# Patient Record
Sex: Male | Born: 1935 | Race: White | Hispanic: No | Smoking: Former smoker
Health system: Southern US, Community
[De-identification: ages and names within clinical notes are randomized; demographics above are authoritative.]

## PROBLEM LIST (undated history)

## (undated) DIAGNOSIS — E7800 Pure hypercholesterolemia, unspecified: Secondary | ICD-10-CM

## (undated) DIAGNOSIS — D649 Anemia, unspecified: Secondary | ICD-10-CM

## (undated) DIAGNOSIS — I219 Acute myocardial infarction, unspecified: Secondary | ICD-10-CM

## (undated) DIAGNOSIS — I639 Cerebral infarction, unspecified: Secondary | ICD-10-CM

## (undated) DIAGNOSIS — M1990 Unspecified osteoarthritis, unspecified site: Secondary | ICD-10-CM

## (undated) DIAGNOSIS — I6529 Occlusion and stenosis of unspecified carotid artery: Secondary | ICD-10-CM

## (undated) DIAGNOSIS — Z8673 Personal history of transient ischemic attack (TIA), and cerebral infarction without residual deficits: Secondary | ICD-10-CM

## (undated) DIAGNOSIS — K219 Gastro-esophageal reflux disease without esophagitis: Secondary | ICD-10-CM

## (undated) DIAGNOSIS — I2510 Atherosclerotic heart disease of native coronary artery without angina pectoris: Secondary | ICD-10-CM

## (undated) DIAGNOSIS — R269 Unspecified abnormalities of gait and mobility: Secondary | ICD-10-CM

## (undated) DIAGNOSIS — G629 Polyneuropathy, unspecified: Secondary | ICD-10-CM

## (undated) DIAGNOSIS — I255 Ischemic cardiomyopathy: Secondary | ICD-10-CM

## (undated) DIAGNOSIS — Z972 Presence of dental prosthetic device (complete) (partial): Secondary | ICD-10-CM

## (undated) DIAGNOSIS — H9192 Unspecified hearing loss, left ear: Secondary | ICD-10-CM

## (undated) DIAGNOSIS — N393 Stress incontinence (female) (male): Secondary | ICD-10-CM

## (undated) DIAGNOSIS — J449 Chronic obstructive pulmonary disease, unspecified: Secondary | ICD-10-CM

## (undated) DIAGNOSIS — D569 Thalassemia, unspecified: Secondary | ICD-10-CM

## (undated) DIAGNOSIS — M21371 Foot drop, right foot: Secondary | ICD-10-CM

## (undated) DIAGNOSIS — G609 Hereditary and idiopathic neuropathy, unspecified: Principal | ICD-10-CM

## (undated) DIAGNOSIS — R42 Dizziness and giddiness: Secondary | ICD-10-CM

## (undated) DIAGNOSIS — G4730 Sleep apnea, unspecified: Secondary | ICD-10-CM

## (undated) DIAGNOSIS — C61 Malignant neoplasm of prostate: Secondary | ICD-10-CM

## (undated) DIAGNOSIS — I10 Essential (primary) hypertension: Secondary | ICD-10-CM

## (undated) HISTORY — DX: Pure hypercholesterolemia, unspecified: E78.00

## (undated) HISTORY — PX: EYE SURGERY: SHX253

## (undated) HISTORY — DX: Dizziness and giddiness: R42

## (undated) HISTORY — DX: Hereditary and idiopathic neuropathy, unspecified: G60.9

## (undated) HISTORY — PX: CARDIAC CATHETERIZATION: SHX172

## (undated) HISTORY — PX: SPINE SURGERY: SHX786

## (undated) HISTORY — PX: CATARACT EXTRACTION W/ INTRAOCULAR LENS IMPLANT: SHX1309

## (undated) HISTORY — PX: APPENDECTOMY: SHX54

## (undated) HISTORY — PX: ANKLE SURGERY: SHX546

## (undated) HISTORY — PX: OTHER SURGICAL HISTORY: SHX169

## (undated) HISTORY — DX: Occlusion and stenosis of unspecified carotid artery: I65.29

## (undated) HISTORY — DX: Atherosclerotic heart disease of native coronary artery without angina pectoris: I25.10

## (undated) HISTORY — PX: PENILE PROSTHESIS IMPLANT: SHX240

## (undated) HISTORY — PX: JOINT REPLACEMENT: SHX530

## (undated) HISTORY — PX: BACK SURGERY: SHX140

## (undated) HISTORY — DX: Unspecified abnormalities of gait and mobility: R26.9

## (undated) HISTORY — PX: CHOLECYSTECTOMY: SHX55

## (undated) HISTORY — DX: Unspecified osteoarthritis, unspecified site: M19.90

## (undated) HISTORY — DX: Polyneuropathy, unspecified: G62.9

## (undated) HISTORY — PX: CAROTID ENDARTERECTOMY: SUR193

## (undated) HISTORY — DX: Anemia, unspecified: D64.9

## (undated) HISTORY — DX: Acute myocardial infarction, unspecified: I21.9

---

## 1977-08-14 DIAGNOSIS — I219 Acute myocardial infarction, unspecified: Secondary | ICD-10-CM

## 1977-08-14 HISTORY — DX: Acute myocardial infarction, unspecified: I21.9

## 2001-04-16 ENCOUNTER — Encounter: Payer: Self-pay | Admitting: Urology

## 2001-04-19 ENCOUNTER — Observation Stay (HOSPITAL_COMMUNITY): Admission: RE | Admit: 2001-04-19 | Discharge: 2001-04-20 | Payer: Self-pay | Admitting: Urology

## 2003-12-23 ENCOUNTER — Inpatient Hospital Stay (HOSPITAL_COMMUNITY): Admission: EM | Admit: 2003-12-23 | Discharge: 2003-12-24 | Payer: Self-pay | Admitting: Emergency Medicine

## 2004-03-01 ENCOUNTER — Ambulatory Visit (HOSPITAL_COMMUNITY): Admission: RE | Admit: 2004-03-01 | Discharge: 2004-03-01 | Payer: Self-pay | Admitting: Urology

## 2004-03-21 ENCOUNTER — Ambulatory Visit: Admission: RE | Admit: 2004-03-21 | Discharge: 2004-06-16 | Payer: Self-pay | Admitting: Radiation Oncology

## 2004-03-29 ENCOUNTER — Emergency Department (HOSPITAL_COMMUNITY): Admission: EM | Admit: 2004-03-29 | Discharge: 2004-03-29 | Payer: Self-pay | Admitting: Emergency Medicine

## 2004-03-30 ENCOUNTER — Encounter: Admission: RE | Admit: 2004-03-30 | Discharge: 2004-03-30 | Payer: Self-pay | Admitting: Urology

## 2004-05-03 ENCOUNTER — Ambulatory Visit (HOSPITAL_COMMUNITY): Admission: RE | Admit: 2004-05-03 | Discharge: 2004-05-03 | Payer: Self-pay | Admitting: Urology

## 2004-05-03 ENCOUNTER — Ambulatory Visit (HOSPITAL_BASED_OUTPATIENT_CLINIC_OR_DEPARTMENT_OTHER): Admission: RE | Admit: 2004-05-03 | Discharge: 2004-05-03 | Payer: Self-pay | Admitting: Urology

## 2004-07-05 ENCOUNTER — Encounter: Admission: RE | Admit: 2004-07-05 | Discharge: 2004-07-05 | Payer: Self-pay | Admitting: Neurology

## 2004-11-28 ENCOUNTER — Ambulatory Visit (HOSPITAL_BASED_OUTPATIENT_CLINIC_OR_DEPARTMENT_OTHER): Admission: RE | Admit: 2004-11-28 | Discharge: 2004-11-28 | Payer: Self-pay | Admitting: Family Medicine

## 2004-12-04 ENCOUNTER — Ambulatory Visit: Payer: Self-pay | Admitting: Internal Medicine

## 2005-04-19 ENCOUNTER — Ambulatory Visit: Payer: Self-pay | Admitting: *Deleted

## 2005-05-05 ENCOUNTER — Ambulatory Visit: Payer: Self-pay

## 2005-08-22 ENCOUNTER — Ambulatory Visit: Payer: Self-pay | Admitting: *Deleted

## 2005-08-25 ENCOUNTER — Ambulatory Visit: Payer: Self-pay | Admitting: *Deleted

## 2005-10-06 ENCOUNTER — Inpatient Hospital Stay (HOSPITAL_COMMUNITY): Admission: RE | Admit: 2005-10-06 | Discharge: 2005-10-10 | Payer: Self-pay | Admitting: Internal Medicine

## 2006-04-11 ENCOUNTER — Ambulatory Visit: Payer: Self-pay | Admitting: *Deleted

## 2006-04-23 ENCOUNTER — Ambulatory Visit: Payer: Self-pay

## 2006-04-23 ENCOUNTER — Encounter: Payer: Self-pay | Admitting: Cardiology

## 2006-05-02 ENCOUNTER — Ambulatory Visit: Payer: Self-pay | Admitting: *Deleted

## 2006-05-03 ENCOUNTER — Ambulatory Visit: Payer: Self-pay

## 2006-05-09 ENCOUNTER — Ambulatory Visit: Payer: Self-pay

## 2006-06-11 ENCOUNTER — Ambulatory Visit (HOSPITAL_COMMUNITY): Admission: RE | Admit: 2006-06-11 | Discharge: 2006-06-12 | Payer: Self-pay | Admitting: Orthopedic Surgery

## 2006-06-13 ENCOUNTER — Emergency Department (HOSPITAL_COMMUNITY): Admission: EM | Admit: 2006-06-13 | Discharge: 2006-06-14 | Payer: Self-pay | Admitting: Emergency Medicine

## 2007-06-12 ENCOUNTER — Ambulatory Visit (HOSPITAL_COMMUNITY): Admission: RE | Admit: 2007-06-12 | Discharge: 2007-06-12 | Payer: Self-pay | Admitting: Cardiology

## 2007-06-12 ENCOUNTER — Ambulatory Visit: Payer: Self-pay | Admitting: Cardiology

## 2007-06-12 LAB — CONVERTED CEMR LAB
ALT: 19 units/L (ref 0–53)
AST: 31 units/L (ref 0–37)
Alkaline Phosphatase: 50 units/L (ref 39–117)
BUN: 20 mg/dL (ref 6–23)
Basophils Relative: 0.9 % (ref 0.0–1.0)
CO2: 29 meq/L (ref 19–32)
Calcium: 8.9 mg/dL (ref 8.4–10.5)
Creatinine, Ser: 1.1 mg/dL (ref 0.4–1.5)
Hemoglobin: 8.8 g/dL — ABNORMAL LOW (ref 13.0–17.0)
INR: 5.5 (ref 0.8–1.0)
Monocytes Absolute: 0.6 10*3/uL (ref 0.2–0.7)
Monocytes Relative: 7.2 % (ref 3.0–11.0)
Potassium: 4.1 meq/L (ref 3.5–5.1)
Prothrombin Time: 30.3 s (ref 10.9–13.3)
RDW: 14.5 % (ref 11.5–14.6)
Total Bilirubin: 1.3 mg/dL — ABNORMAL HIGH (ref 0.3–1.2)
Total Protein: 6.3 g/dL (ref 6.0–8.3)

## 2007-06-14 ENCOUNTER — Encounter (HOSPITAL_COMMUNITY): Admission: RE | Admit: 2007-06-14 | Discharge: 2007-08-16 | Payer: Self-pay | Admitting: Family Medicine

## 2007-08-07 ENCOUNTER — Ambulatory Visit: Payer: Self-pay

## 2007-08-15 HISTORY — PX: RADIOACTIVE SEED IMPLANT: SHX5150

## 2007-08-31 ENCOUNTER — Emergency Department (HOSPITAL_COMMUNITY): Admission: EM | Admit: 2007-08-31 | Discharge: 2007-08-31 | Payer: Self-pay | Admitting: *Deleted

## 2007-09-01 ENCOUNTER — Observation Stay (HOSPITAL_COMMUNITY): Admission: EM | Admit: 2007-09-01 | Discharge: 2007-09-03 | Payer: Self-pay | Admitting: Emergency Medicine

## 2007-09-06 ENCOUNTER — Ambulatory Visit: Payer: Self-pay

## 2007-09-13 ENCOUNTER — Ambulatory Visit: Payer: Self-pay | Admitting: Cardiology

## 2007-09-13 LAB — CONVERTED CEMR LAB
CO2: 32 meq/L (ref 19–32)
GFR calc Af Amer: 92 mL/min
Glucose, Bld: 105 mg/dL — ABNORMAL HIGH (ref 70–99)
Potassium: 4.3 meq/L (ref 3.5–5.1)
Sodium: 143 meq/L (ref 135–145)

## 2007-10-03 ENCOUNTER — Ambulatory Visit (HOSPITAL_COMMUNITY): Admission: RE | Admit: 2007-10-03 | Discharge: 2007-10-03 | Payer: Self-pay | Admitting: Gastroenterology

## 2007-10-14 ENCOUNTER — Ambulatory Visit (HOSPITAL_BASED_OUTPATIENT_CLINIC_OR_DEPARTMENT_OTHER): Admission: RE | Admit: 2007-10-14 | Discharge: 2007-10-15 | Payer: Self-pay | Admitting: Orthopedic Surgery

## 2007-12-24 ENCOUNTER — Ambulatory Visit: Payer: Self-pay | Admitting: Oncology

## 2008-02-17 ENCOUNTER — Ambulatory Visit: Payer: Self-pay | Admitting: Cardiology

## 2008-02-17 ENCOUNTER — Inpatient Hospital Stay (HOSPITAL_COMMUNITY): Admission: RE | Admit: 2008-02-17 | Discharge: 2008-03-02 | Payer: Self-pay | Admitting: Orthopedic Surgery

## 2008-02-21 ENCOUNTER — Ambulatory Visit: Payer: Self-pay | Admitting: Physical Medicine & Rehabilitation

## 2008-03-12 ENCOUNTER — Ambulatory Visit: Payer: Self-pay | Admitting: Cardiology

## 2008-04-15 ENCOUNTER — Ambulatory Visit: Payer: Self-pay | Admitting: Oncology

## 2008-04-21 ENCOUNTER — Ambulatory Visit: Payer: Self-pay | Admitting: Cardiology

## 2008-04-21 LAB — CBC WITH DIFFERENTIAL/PLATELET
Basophils Absolute: 0 10*3/uL (ref 0.0–0.1)
EOS%: 5 % (ref 0.0–7.0)
HGB: 10.6 g/dL — ABNORMAL LOW (ref 13.0–17.1)
MCH: 21.2 pg — ABNORMAL LOW (ref 28.0–33.4)
MONO#: 0.4 10*3/uL (ref 0.1–0.9)
NEUT#: 2.9 10*3/uL (ref 1.5–6.5)
RDW: 15.3 % — ABNORMAL HIGH (ref 11.2–14.6)
WBC: 5.7 10*3/uL (ref 4.0–10.0)
lymph#: 2 10*3/uL (ref 0.9–3.3)

## 2008-08-06 ENCOUNTER — Ambulatory Visit: Payer: Self-pay

## 2008-10-19 ENCOUNTER — Ambulatory Visit: Payer: Self-pay | Admitting: Oncology

## 2008-11-05 ENCOUNTER — Encounter (INDEPENDENT_AMBULATORY_CARE_PROVIDER_SITE_OTHER): Payer: Self-pay | Admitting: *Deleted

## 2008-11-06 ENCOUNTER — Ambulatory Visit: Payer: Self-pay | Admitting: Cardiology

## 2008-11-06 ENCOUNTER — Encounter: Payer: Self-pay | Admitting: Cardiology

## 2008-11-06 DIAGNOSIS — I2510 Atherosclerotic heart disease of native coronary artery without angina pectoris: Secondary | ICD-10-CM

## 2008-12-18 ENCOUNTER — Encounter: Payer: Self-pay | Admitting: Cardiovascular Disease

## 2008-12-18 ENCOUNTER — Observation Stay (HOSPITAL_COMMUNITY): Admission: EM | Admit: 2008-12-18 | Discharge: 2008-12-20 | Payer: Self-pay | Admitting: Cardiovascular Disease

## 2008-12-18 ENCOUNTER — Ambulatory Visit: Payer: Self-pay | Admitting: Cardiovascular Disease

## 2008-12-22 ENCOUNTER — Ambulatory Visit: Payer: Self-pay | Admitting: Cardiology

## 2008-12-25 LAB — CONVERTED CEMR LAB
BUN: 31 mg/dL — ABNORMAL HIGH (ref 6–23)
Creatinine, Ser: 1.2 mg/dL (ref 0.4–1.5)
GFR calc non Af Amer: 61.46 mL/min (ref >60)

## 2008-12-29 ENCOUNTER — Telehealth: Payer: Self-pay | Admitting: Cardiology

## 2009-01-01 ENCOUNTER — Ambulatory Visit: Payer: Self-pay | Admitting: Cardiology

## 2009-01-01 DIAGNOSIS — R609 Edema, unspecified: Secondary | ICD-10-CM

## 2009-01-01 DIAGNOSIS — I5032 Chronic diastolic (congestive) heart failure: Secondary | ICD-10-CM

## 2009-03-31 ENCOUNTER — Encounter (INDEPENDENT_AMBULATORY_CARE_PROVIDER_SITE_OTHER): Payer: Self-pay | Admitting: *Deleted

## 2009-04-21 ENCOUNTER — Ambulatory Visit: Payer: Self-pay | Admitting: Cardiology

## 2009-04-21 DIAGNOSIS — I6529 Occlusion and stenosis of unspecified carotid artery: Secondary | ICD-10-CM

## 2009-04-21 DIAGNOSIS — I10 Essential (primary) hypertension: Secondary | ICD-10-CM

## 2009-08-10 ENCOUNTER — Encounter: Payer: Self-pay | Admitting: Cardiology

## 2009-08-11 ENCOUNTER — Ambulatory Visit: Payer: Self-pay

## 2009-08-11 ENCOUNTER — Encounter: Payer: Self-pay | Admitting: Cardiology

## 2009-10-12 ENCOUNTER — Ambulatory Visit: Payer: Self-pay | Admitting: Cardiology

## 2009-12-01 ENCOUNTER — Telehealth: Payer: Self-pay | Admitting: Cardiology

## 2009-12-26 ENCOUNTER — Inpatient Hospital Stay (HOSPITAL_COMMUNITY): Admission: EM | Admit: 2009-12-26 | Discharge: 2009-12-28 | Payer: Self-pay | Admitting: Emergency Medicine

## 2010-03-10 ENCOUNTER — Encounter: Payer: Self-pay | Admitting: Cardiology

## 2010-03-24 ENCOUNTER — Inpatient Hospital Stay (HOSPITAL_COMMUNITY): Admission: RE | Admit: 2010-03-24 | Discharge: 2010-03-27 | Payer: Self-pay | Admitting: Orthopedic Surgery

## 2010-03-24 HISTORY — PX: LUMBAR LAMINECTOMY: SHX95

## 2010-04-29 ENCOUNTER — Ambulatory Visit: Payer: Self-pay | Admitting: Cardiology

## 2010-08-12 ENCOUNTER — Encounter: Payer: Self-pay | Admitting: Cardiology

## 2010-08-16 ENCOUNTER — Encounter: Payer: Self-pay | Admitting: Cardiology

## 2010-08-16 ENCOUNTER — Ambulatory Visit: Admission: RE | Admit: 2010-08-16 | Discharge: 2010-08-16 | Payer: Self-pay | Source: Home / Self Care

## 2010-09-13 NOTE — Assessment & Plan Note (Signed)
Summary: f 6 months   Visit Type:  6 mo f/u Primary Provider:  Illa Level MD  CC:  right leg edema...little sob...denies any cp.  History of Present Illness: [name] comes in today for followup.  He has no complaints of angina except on rare occasion when he pushes himself. He goes away with rest. He still sleeps on 2 pillows and has had no orthopnea or PND. He does have some peripheral edema at the end of the day.  Very compliant with medications.  Current Medications (verified): 1)  Gabapentin 300 Mg Caps (Gabapentin) .Marland Kitchen.. 1 Tab Three Times A Day 2)  Oxybutynin Chloride 5 Mg Tabs (Oxybutynin Chloride) .Marland Kitchen.. 1 Tab Three Times A Day 3)  Carvedilol 6.25 Mg Tabs (Carvedilol) .... 1/2 Tab Two Times A Day 4)  Pravastatin Sodium 20 Mg Tabs (Pravastatin Sodium) .... 3 Tabs At Bedtime 5)  Vitamin C 500 Mg Tabs (Ascorbic Acid) .Marland Kitchen.. 1 Tab Once Daily 6)  Lisinopril 5 Mg Tabs (Lisinopril) .... Take One Tablet By Mouth Daily 7)  Senokot S 8.6-50 Mg Tabs (Sennosides-Docusate Sodium) .... Use As Directed 8)  Tylenol 8 Hour 650 Mg Cr-Tabs (Acetaminophen) .... Use As Directed 9)  Colace 100 Mg Caps (Docusate Sodium) .... Use As Directed..prn 10)  Furosemide 20 Mg Tabs (Furosemide) .... Take One Tablet By Mouth Daily. 11)  Nitrostat 0.4 Mg Subl (Nitroglycerin) .Marland Kitchen.. 1 Tablet Under Tongue At Onset of Chest Pain; You May Repeat Every 5 Minutes For Up To 3 Doses.  Allergies: 1)  ! Pcn 2)  ! Sulfa 3)  ! * Dilaudid 4)  ! Plavix (Clopidogrel Bisulfate) 5)  Amoxicillin (Amoxicillin) 6)  Sulfamethoxazole (Sulfamethoxazole)  Past History:  Past Medical History: Last updated: 10/06/2009 CAD, NATIVE VESSEL (ICD-414.01) CAROTID ARTERY STENOSIS, WITHOUT INFARCTION (ICD-433.10) HYPERTENSION, BENIGN (ICD-401.1) DIASTOLIC HEART FAILURE, CHRONIC (ICD-428.32) EDEMA (ICD-782.3)  Past Surgical History: Last updated: 11/04/2008 Penile implant Cholecystectomy Right Carotid Endarterectomy  Family  History: Last updated: 11/04/2008 FH significant for Thalassemia  Social History: Last updated: 11/04/2008 Widowed  Tobacco Use - No.  Alcohol Use - no Drug Use - no  Risk Factors: Smoking Status: never (11/04/2008)  Review of Systems       negative other than history of present illness  Vital Signs:  Patient profile:   77 year old male Height:      71 inches Weight:      181 pounds Pulse rate:   72 / minute Pulse rhythm:   regular BP sitting:   108 / 54  (left arm) Cuff size:   large  Vitals Entered By: Danielle Rankin, CMA (October 12, 2009 11:05 AM)  Physical Exam  General:  Well developed, well nourished, in no acute distress. Head:  normocephalic and atraumatic Neck:  Neck supple, no JVD. No masses, thyromegaly or abnormal cervical nodes. Chest Sallye Lunz:  no deformities or breast masses noted Lungs:  Clear bilaterally to auscultation and percussion. Heart:  Non-displaced PMI, chest non-tender; regular rate and rhythm, S1, S2 without murmurs, rubs or gallops. Carotid upstroke normal, no bruit. Normal abdominal aortic size, no bruits. Femorals normal pulses, no bruits. Pedals normal pulses. No edema, no varicosities. Msk:  decreased ROM.   Pulses:  pulses normal in all 4 extremities Extremities:  trace left pedal edema and trace right pedal edema.   Neurologic:  Alert and oriented x 3. Skin:  Intact without lesions or rashes. Psych:  Normal affect.   EKG  Procedure date:  04/21/2009  Findings:  normal sinus rhythm, T wave changes inferiorly, poor progression anterior corneum, stable findings.  Impression & Recommendations:  Problem # 1:  CAROTID ARTERY STENOSIS, WITHOUT INFARCTION (ICD-433.10) Assessment Unchanged  The following medications were removed from the medication list:    Aspirin Ec 325 Mg Tbec (Aspirin) .Marland Kitchen... Take one tablet by mouth daily  Problem # 2:  CAD, NATIVE VESSEL (ICD-414.01) Assessment: Unchanged  The following medications were  removed from the medication list:    Aspirin Ec 325 Mg Tbec (Aspirin) .Marland Kitchen... Take one tablet by mouth daily His updated medication list for this problem includes:    Carvedilol 6.25 Mg Tabs (Carvedilol) .Marland Kitchen... 1/2 tab two times a day    Lisinopril 5 Mg Tabs (Lisinopril) .Marland Kitchen... Take one tablet by mouth daily    Nitrostat 0.4 Mg Subl (Nitroglycerin) .Marland Kitchen... 1 tablet under tongue at onset of chest pain; you may repeat every 5 minutes for up to 3 doses.  Problem # 3:  HYPERTENSION, BENIGN (ICD-401.1) Assessment: Improved  The following medications were removed from the medication list:    Aspirin Ec 325 Mg Tbec (Aspirin) .Marland Kitchen... Take one tablet by mouth daily His updated medication list for this problem includes:    Carvedilol 6.25 Mg Tabs (Carvedilol) .Marland Kitchen... 1/2 tab two times a day    Lisinopril 5 Mg Tabs (Lisinopril) .Marland Kitchen... Take one tablet by mouth daily    Furosemide 20 Mg Tabs (Furosemide) .Marland Kitchen... Take one tablet by mouth daily.  Problem # 4:  DIASTOLIC HEART FAILURE, CHRONIC (ICD-428.32) Assessment: Unchanged  The following medications were removed from the medication list:    Aspirin Ec 325 Mg Tbec (Aspirin) .Marland Kitchen... Take one tablet by mouth daily His updated medication list for this problem includes:    Carvedilol 6.25 Mg Tabs (Carvedilol) .Marland Kitchen... 1/2 tab two times a day    Lisinopril 5 Mg Tabs (Lisinopril) .Marland Kitchen... Take one tablet by mouth daily    Furosemide 20 Mg Tabs (Furosemide) .Marland Kitchen... Take one tablet by mouth daily.    Nitrostat 0.4 Mg Subl (Nitroglycerin) .Marland Kitchen... 1 tablet under tongue at onset of chest pain; you may repeat every 5 minutes for up to 3 doses.  Patient Instructions: 1)  Your physician recommends that you schedule a follow-up appointment in: 6 MONTHS WITH DR Kenneith Stief 2)  Your physician recommends that you continue on your current medications as directed. Please refer to the Current Medication list given to you today.

## 2010-09-13 NOTE — Progress Notes (Signed)
Summary: Having back surgery need clearnace  Phone Note Call from Patient Call back at Home Phone [phone]   Caller: Patient Summary of Call: Pt having surgery on his back and need clearance. Initial call taken by: Judie Grieve,  December 01, 2009 2:57 PM  Follow-up for Phone Call        Just saw in office. He is stable and can be cleared for surgery at low risk. Follow-up by: Gaylord Shih, MD, [facility],  December 01, 2009 3:10 PM     Appended Document: Having back surgery need clearnace PT AWARE./CY  Appended Document: Having back surgery need clearnace SENT VIA FAX TO [facility] TO FAX NO [phone]./CY

## 2010-09-13 NOTE — Letter (Signed)
Summary: [facility]  [facility]   Imported By: Marylou Mccoy 03/21/2010 16:20:11  _____________________________________________________________________  External Attachment:    Type:   Image     Comment:   External Document

## 2010-09-13 NOTE — Assessment & Plan Note (Signed)
Summary: 6 mo f/u ./cy   Visit Type:  6  mo f/u Primary Provider:  Illa Level MD  CC:  edema/legs and especially the right leg states pt....Marland Kitchenno other complaints today.  History of Present Illness: [name] returns today for further evaluation and management of his coronary artery disease and nonobstructive carotid disease.  He's having no major ischemic symptoms. He recently had back surgery in August and did well from a cardiac standpoint. He's developed a foot drop on the right. Between that and his neuropathies had some problems with his balance.  He denies orthopnea, PND or increased edema. He has a history of acute pulmonary edema from hypertensive crisis. He's very compliant with his medications and his diet.  He's had no symptoms of TIAs or mini strokes. Carotid Dopplers in December of 2000 and were stable.  Current Medications (verified): 1)  Gabapentin 300 Mg Caps (Gabapentin) .Marland Kitchen.. 1 Tab Three Times A Day 2)  Oxybutynin Chloride 5 Mg Tabs (Oxybutynin Chloride) .Marland Kitchen.. 1 Tab Three Times A Day 3)  Carvedilol 6.25 Mg Tabs (Carvedilol) .... 1/2 Tab Two Times A Day 4)  Pravastatin Sodium 20 Mg Tabs (Pravastatin Sodium) .... 3 Tabs At Bedtime 5)  Vitamin C 500 Mg Tabs (Ascorbic Acid) .Marland Kitchen.. 1 Tab Once Daily 6)  Senokot S 8.6-50 Mg Tabs (Sennosides-Docusate Sodium) .... Use As Directed 7)  Tylenol 8 Hour 650 Mg Cr-Tabs (Acetaminophen) .... Use As Directed 8)  Colace 100 Mg Caps (Docusate Sodium) .... Use As Directed..prn 9)  Furosemide 20 Mg Tabs (Furosemide) .... Take One Tablet By Mouth Daily. 10)  Nitrostat 0.4 Mg Subl (Nitroglycerin) .Marland Kitchen.. 1 Tablet Under Tongue At Onset of Chest Pain; You May Repeat Every 5 Minutes For Up To 3 Doses. 11)  Losartan Potassium 25 Mg Tabs (Losartan Potassium) .Marland Kitchen.. 1 Tab Once Daily 12)  Oxycodone-Acetaminophen 5-325 Mg Tabs (Oxycodone-Acetaminophen) .Marland Kitchen.. 1 Tab Q 4-6 Hours As Needed 13)  Tylenol Arthritis Pain 650 Mg Cr-Tabs (Acetaminophen) .... As  Needed 14)  Miralax  Powd (Polyethylene Glycol 3350) .... As Needed  Allergies: 1)  ! Pcn 2)  ! Sulfa 3)  ! * Dilaudid 4)  ! Plavix (Clopidogrel Bisulfate) 5)  Amoxicillin (Amoxicillin) 6)  Sulfamethoxazole (Sulfamethoxazole)  Past History:  Past Medical History: Last updated: 10/06/2009 CAD, NATIVE VESSEL (ICD-414.01) CAROTID ARTERY STENOSIS, WITHOUT INFARCTION (ICD-433.10) HYPERTENSION, BENIGN (ICD-401.1) DIASTOLIC HEART FAILURE, CHRONIC (ICD-428.32) EDEMA (ICD-782.3)  Past Surgical History: Last updated: 04/26/2010 Penile implant Cholecystectomy Right Carotid Endarterectomy Back Surgery...lumbar laminectomy at L3-4 and L4-5.Marland KitchenMarland Kitchen8/11/11 Ranee Gosselin, MD  Family History: Last updated: 11/04/2008 FH significant for Thalassemia  Social History: Last updated: 11/04/2008 Widowed  Tobacco Use - No.  Alcohol Use - no Drug Use - no  Risk Factors: Smoking Status: never (11/04/2008)  Review of Systems       negative other than history of present illness  Vital Signs:  Patient profile:   77 year old male Height:      71 inches Weight:      175.8 pounds BMI:     24.61 Pulse rate:   63 / minute Pulse rhythm:   irregular BP sitting:   130 / 80  (left arm) Cuff size:   large  Vitals Entered By: Danielle Rankin, CMA (April 29, 2010 10:10 AM)  Physical Exam  General:  no acute distress, pleasant Head:  normocephalic and atraumatic Eyes:  glasses otherwise normal Neck:  Neck supple, no JVD. No masses, thyromegaly or abnormal cervical nodes. Chest Wall:  no  deformities or breast masses noted Lungs:  Clear bilaterally to auscultation and percussion. Heart:  PMI nondisplaced, regular rate and rhythm, normal S1-S2, no gallop, carotid physical by without obvious bruits, right carotid endarterectomy scar Msk:  decreased ROM.   Pulses:  reduced bilaterally lower extremity, reduce capillary refill Extremities:  No clubbing or cyanosis. Neurologic:  Alert and oriented x  3. Skin:  Intact without lesions or rashes. Psych:  Normal affect.   EKG  Procedure date:  04/29/2010  Findings:      normal sinus rhythm, previous anteroseptal wall infarct with EKG changes, stable  Impression & Recommendations:  Problem # 1:  CAD, NATIVE VESSEL (ICD-414.01) Assessment Unchanged  The following medications were removed from the medication list:    Lisinopril 5 Mg Tabs (Lisinopril) .Marland Kitchen... Take one tablet by mouth daily His updated medication list for this problem includes:    Carvedilol 6.25 Mg Tabs (Carvedilol) .Marland Kitchen... 1/2 tab two times a day    Nitrostat 0.4 Mg Subl (Nitroglycerin) .Marland Kitchen... 1 tablet under tongue at onset of chest pain; you may repeat every 5 minutes for up to 3 doses.  Orders: EKG w/ Interpretation (93000)  Problem # 2:  CAROTID ARTERY STENOSIS, WITHOUT INFARCTION (ICD-433.10) Assessment: Unchanged  Problem # 3:  EDEMA (ICD-782.3) Assessment: Improved  Problem # 4:  HYPERTENSION, BENIGN (ICD-401.1) Assessment: Improved  The following medications were removed from the medication list:    Lisinopril 5 Mg Tabs (Lisinopril) .Marland Kitchen... Take one tablet by mouth daily His updated medication list for this problem includes:    Carvedilol 6.25 Mg Tabs (Carvedilol) .Marland Kitchen... 1/2 tab two times a day    Furosemide 20 Mg Tabs (Furosemide) .Marland Kitchen... Take one tablet by mouth daily.    Losartan Potassium 25 Mg Tabs (Losartan potassium) .Marland Kitchen... 1 tab once daily  Problem # 5:  HYPERTENSION, BENIGN (ICD-401.1)  The following medications were removed from the medication list:    Lisinopril 5 Mg Tabs (Lisinopril) .Marland Kitchen... Take one tablet by mouth daily His updated medication list for this problem includes:    Carvedilol 6.25 Mg Tabs (Carvedilol) .Marland Kitchen... 1/2 tab two times a day    Furosemide 20 Mg Tabs (Furosemide) .Marland Kitchen... Take one tablet by mouth daily.    Losartan Potassium 25 Mg Tabs (Losartan potassium) .Marland Kitchen... 1 tab once daily  Clinical Reports Reviewed:  Cardiac  Cath:  12/18/2008: Cardiac Cath Findings:  IMPRESSION:   1. Double-vessel coronary artery disease that appears stable and       unchanged from cath in July 2009.   2. Acute pulmonary edema that is likely secondary to diastolic       dysfunction.  The patient's blood pressure was elevated to 170       systolically when he arrived in Cath Lab.      Nuclear Study:  12/18/2008:   Clinical Data: Elevated D-dimer.  Myocardial infarction., shortness   of breath    NUCLEAR MEDICINE VENTILATION AND PERFUSION SCAN    Technique:  Wash-in, equilibrium, and wash-out phase ventilation   images were obtained using Xe-133 gas.  Perfusion images were   obtained in multiple projections after intravenous injection of Tc-   23m MAA.    Radiopharmaceutical: 10 mCi Xe-133 inhaled and 6.2 mCi Tc16m MAA IV    Comparison: None    Findings: Todays chest radiograph demonstrates bilateral airspace   edema or infiltrates.   Ventilation scintigraphy shows homogeneous distribution throughout   both lungs with mild diffuse retention on the washout images.  Perfusion images show a heterogeneous distribution of   radiopharmaceutical without discrete segmental or subsegmental   perfusion defect.  There is suggestion of fluid in or thickening of   the interlobar fissures.    IMPRESSION:   Low likelihood ratio for pulmonary embolism.    Read By:  Deanne Coffer, D. Reuel Boom,  M.D.   Released By:  Corlis Leak. Reuel Boom,  M.D.  Additional Information  HL7 RESULT STATUS : F  External image : [id number]  External IF Update Timestamp : 2008-12-18:15:12:03.000000  09/06/2007:  Excerise capacity: Adenosine study with no exercise  Blood Pressure response: Normal blood pressure response  Clinical symptoms: Mild ches pain/dyspnea  ECG impression: Bo significant ST segement change suggestive of ischemia  Overall impression: Abnormal adenosine nuclear study as noted above   05/03/2006:  Excerise capacity:  Adenosine study with no exercise  Blood Pressure response:  Clinical symptoms:  ECG impression: No significant ST segment change suggestive of ischemia  Overall impression: Abnormal adenosine nuclear study with prior anterior, apical and septal infarct; no ischemia.   Patient Instructions: 1)  Your physician recommends that you schedule a follow-up appointment in: 1 year with Dr. [name] 2)  Your physician recommends that you continue on your current medications as directed. Please refer to the Current Medication list given to you today.

## 2010-09-15 NOTE — Miscellaneous (Signed)
Summary: Orders Update  Clinical Lists Changes  Orders: Added new Test order of Carotid Duplex (Carotid Duplex) - Signed 

## 2010-09-20 ENCOUNTER — Encounter: Payer: Self-pay | Admitting: Cardiology

## 2010-10-25 NOTE — Letter (Signed)
[name]   Imported By: Roderic Ovens 10/21/2010 16:17:12  _____________________________________________________________________  External Attachment:    Type:   Image     Comment:   External Document

## 2010-10-28 LAB — URINALYSIS, ROUTINE W REFLEX MICROSCOPIC
Bilirubin Urine: NEGATIVE
Glucose, UA: NEGATIVE mg/dL
Hgb urine dipstick: NEGATIVE
Ketones, ur: NEGATIVE mg/dL
Protein, ur: NEGATIVE mg/dL
Urobilinogen, UA: 1 mg/dL (ref 0.0–1.0)

## 2010-10-28 LAB — COMPREHENSIVE METABOLIC PANEL
Albumin: 4 g/dL (ref 3.5–5.2)
Alkaline Phosphatase: 62 U/L (ref 39–117)
BUN: 24 mg/dL — ABNORMAL HIGH (ref 6–23)
Calcium: 9.6 mg/dL (ref 8.4–10.5)
Potassium: 4.7 mEq/L (ref 3.5–5.1)
Sodium: 142 mEq/L (ref 135–145)
Total Protein: 7.2 g/dL (ref 6.0–8.3)

## 2010-10-28 LAB — TYPE AND SCREEN

## 2010-10-28 LAB — SURGICAL PCR SCREEN: Staphylococcus aureus: POSITIVE — AB

## 2010-10-28 LAB — PROTIME-INR
INR: 1.11 (ref 0.00–1.49)
Prothrombin Time: 14.5 seconds (ref 11.6–15.2)

## 2010-10-28 LAB — CBC
Platelets: 194 10*3/uL (ref 150–400)
RDW: 15.2 % (ref 11.5–15.5)
WBC: 6.8 10*3/uL (ref 4.0–10.5)

## 2010-10-28 LAB — DIFFERENTIAL
Basophils Relative: 1 % (ref 0–1)
Eosinophils Relative: 6 % — ABNORMAL HIGH (ref 0–5)
Monocytes Absolute: 0.6 10*3/uL (ref 0.1–1.0)
Monocytes Relative: 9 % (ref 3–12)
Neutrophils Relative %: 59 % (ref 43–77)

## 2010-10-28 LAB — APTT: aPTT: 32 seconds (ref 24–37)

## 2010-10-28 LAB — HEMOGLOBIN AND HEMATOCRIT, BLOOD
HCT: 27.5 % — ABNORMAL LOW (ref 39.0–52.0)
HCT: 32.2 % — ABNORMAL LOW (ref 39.0–52.0)
Hemoglobin: 10.2 g/dL — ABNORMAL LOW (ref 13.0–17.0)
Hemoglobin: 8.7 g/dL — ABNORMAL LOW (ref 13.0–17.0)

## 2010-10-31 LAB — URINALYSIS, ROUTINE W REFLEX MICROSCOPIC
Ketones, ur: NEGATIVE mg/dL
Nitrite: NEGATIVE
Specific Gravity, Urine: 1.005 (ref 1.005–1.030)
Urobilinogen, UA: 1 mg/dL (ref 0.0–1.0)
pH: 7 (ref 5.0–8.0)

## 2010-10-31 LAB — CBC
HCT: 35.6 % — ABNORMAL LOW (ref 39.0–52.0)
Hemoglobin: 11 g/dL — ABNORMAL LOW (ref 13.0–17.0)
MCV: 66.5 fL — ABNORMAL LOW (ref 78.0–100.0)
Platelets: 167 10*3/uL (ref 150–400)
WBC: 4.9 10*3/uL (ref 4.0–10.5)

## 2010-10-31 LAB — COMPREHENSIVE METABOLIC PANEL
Albumin: 3.3 g/dL — ABNORMAL LOW (ref 3.5–5.2)
Alkaline Phosphatase: 51 U/L (ref 39–117)
BUN: 8 mg/dL (ref 6–23)
Chloride: 110 mEq/L (ref 96–112)
Creatinine, Ser: 0.84 mg/dL (ref 0.4–1.5)
GFR calc non Af Amer: >60 mL/min (ref >60)
Glucose, Bld: 128 mg/dL — ABNORMAL HIGH (ref 70–99)
Potassium: 3.5 mEq/L (ref 3.5–5.1)
Total Bilirubin: 0.8 mg/dL (ref 0.3–1.2)

## 2010-10-31 LAB — URINE CULTURE: Colony Count: 6000

## 2010-10-31 LAB — HEMOCCULT GUIAC POC 1CARD (OFFICE): Fecal Occult Bld: NEGATIVE

## 2010-10-31 LAB — DIFFERENTIAL
Basophils Absolute: 0 10*3/uL (ref 0.0–0.1)
Eosinophils Absolute: 0.1 10*3/uL (ref 0.0–0.7)
Lymphocytes Relative: 27 % (ref 12–46)
Monocytes Relative: 8 % (ref 3–12)
Neutro Abs: 3.1 10*3/uL (ref 1.7–7.7)
Neutrophils Relative %: 62 % (ref 43–77)

## 2010-10-31 LAB — LIPASE, BLOOD: Lipase: 17 U/L (ref 11–59)

## 2010-11-22 LAB — BASIC METABOLIC PANEL
BUN: 38 mg/dL — ABNORMAL HIGH (ref 6–23)
Calcium: 8.9 mg/dL (ref 8.4–10.5)
Creatinine, Ser: 1.63 mg/dL — ABNORMAL HIGH (ref 0.4–1.5)
GFR calc non Af Amer: 41 mL/min — ABNORMAL LOW (ref >60)
Glucose, Bld: 125 mg/dL — ABNORMAL HIGH (ref 70–99)
Potassium: 4 mEq/L (ref 3.5–5.1)

## 2010-11-22 LAB — CBC
HCT: 38.7 % — ABNORMAL LOW (ref 39.0–52.0)
MCHC: 31.2 g/dL (ref 30.0–36.0)
MCHC: 31.8 g/dL (ref 30.0–36.0)
MCV: 65.5 fL — ABNORMAL LOW (ref 78.0–100.0)
MCV: 65.9 fL — ABNORMAL LOW (ref 78.0–100.0)
Platelets: 168 10*3/uL (ref 150–400)
Platelets: 194 10*3/uL (ref 150–400)
WBC: 11.5 10*3/uL — ABNORMAL HIGH (ref 4.0–10.5)

## 2010-11-22 LAB — COMPREHENSIVE METABOLIC PANEL
AST: 16 U/L (ref 0–37)
AST: 21 U/L (ref 0–37)
Albumin: 3.1 g/dL — ABNORMAL LOW (ref 3.5–5.2)
Albumin: 3.2 g/dL — ABNORMAL LOW (ref 3.5–5.2)
BUN: 21 mg/dL (ref 6–23)
Calcium: 8.4 mg/dL (ref 8.4–10.5)
Calcium: 8.9 mg/dL (ref 8.4–10.5)
Chloride: 110 mEq/L (ref 96–112)
Creatinine, Ser: 1.22 mg/dL (ref 0.4–1.5)
Creatinine, Ser: 1.44 mg/dL (ref 0.4–1.5)
GFR calc Af Amer: 57 mL/min — ABNORMAL LOW (ref >60)
GFR calc Af Amer: >60 mL/min (ref >60)
GFR calc non Af Amer: 57 mL/min — ABNORMAL LOW (ref >60)
Sodium: 141 mEq/L (ref 135–145)
Total Bilirubin: 0.8 mg/dL (ref 0.3–1.2)
Total Protein: 6.1 g/dL (ref 6.0–8.3)

## 2010-11-22 LAB — CARDIAC PANEL(CRET KIN+CKTOT+MB+TROPI)
CK, MB: 6.5 ng/mL — ABNORMAL HIGH (ref 0.3–4.0)
Relative Index: INVALID (ref 0.0–2.5)
Relative Index: INVALID (ref 0.0–2.5)
Total CK: 107 U/L (ref 7–232)
Total CK: 81 U/L (ref 7–232)
Troponin I: 0.02 ng/mL (ref 0.00–0.06)
Troponin I: 0.72 ng/mL (ref 0.00–0.06)

## 2010-11-22 LAB — LIPID PANEL
HDL: 24 mg/dL — ABNORMAL LOW (ref >39)
LDL Cholesterol: 75 mg/dL (ref 0–99)
Total CHOL/HDL Ratio: 5.4 RATIO
Triglycerides: 150 mg/dL — ABNORMAL HIGH (ref <150)
VLDL: 30 mg/dL (ref 0–40)

## 2010-11-22 LAB — PROTIME-INR: Prothrombin Time: 15.5 seconds — ABNORMAL HIGH (ref 11.6–15.2)

## 2010-11-22 LAB — APTT: aPTT: 24 seconds (ref 24–37)

## 2010-11-22 LAB — HEMOGLOBIN A1C
Hgb A1c MFr Bld: 5.9 % (ref 4.6–6.1)
Mean Plasma Glucose: 123 mg/dL

## 2010-12-27 NOTE — Consult Note (Signed)
[name], [name] NO.:  [id number]   MEDICAL RECORD NO.:  [id number]          PATIENT TYPE:  INP   LOCATION:  4733                         FACILITY:  [facility]   PHYSICIAN:  Bernette Redbird, M.D.   DATE OF BIRTH:  [birth year]   DATE OF CONSULTATION:  02/26/2008  DATE OF DISCHARGE:                                 CONSULTATION   We were asked to see [name] today in consultation for anemia as well as  SBO versus ileus by [name] of [facility].  This patient  is an 77 year old male who was admitted for a left total knee  replacement, which was completed on February 17, 2008.  Since that time, he  developed angina and had unfortunately a non-ST-elevation MI on  approximately February 21, 2008.  For the last 2 days, he reports his  abdomen has become more distended.  He began vomiting last night and NG  tube was placed.  The patient is no longer vomiting.  He does report  that prior to admission, he had a bowel movement, maybe once every 3 or  4 days.  He did not see any black or red in his bowel movements.  He has  had a decrease in appetite over the last several months, but does not  report losing weight.  He appears to have had a history of ileus versus  partial small bowel obstruction in both 2005 after colonoscopy as well  as in 2007.  These were medically managed.  With regards to his anemia,  please see records on the chart from Dr. Donavan Garner office.  The patient  has been repeatedly heme-negative.  He had a colonoscopy in 2005 with  small polyps.  He had an upper endoscopy in 2009 that was negative.  He  had a Hematology consultation with Dr. Clelia Garner in 2009 with no followup  or prescription recommendations.  He does have a history of thalassemia  minor.   PAST MEDICAL HISTORY:  Significant for peptic ulcer disease, TIA,  hypertension, hyperlipidemia, and coronary artery disease.  He had an  ejection fraction of 37% in January 2009.  He has had chronic  anemia  with thalassemia minor.  He had a history of prostate cancer and has had  a cystoscopy with I-125 seed implantation.  He has a history of  adenomatous colon polyps.  He has peripheral neuropathy, degenerative  joint disease, and now has had both knees replaced.  He has obstructive  sleep apnea and history of TIA.  He is status post appendectomy, back  surgery, cholecystectomy, carotid endarterectomy, multiple knee  surgeries, bladder neck contracture release, and as mentioned,  cystoscopy with I-125 seed implantation.   ALLERGIES:  PENICILLIN, SULFA, and HYDROMORPHONE.   CURRENT MEDICATIONS:  Aspirin 325 mg, omeprazole, and the rest as per  chart.   REVIEW OF SYSTEMS:  Significant for coughing up thick phlegm as well as  anorexia.   SOCIAL HISTORY:  Negative for alcohol and tobacco.  He is a widower.  His son is his primary caretaker.  FAMILY HISTORY:  Negative for colon cancer.   PHYSICAL EXAMINATION:  GENERAL:  Alert, oriented, and in no apparent  distress.  He does have an NG placed that is draining reddish brown  fluid.  VITAL SIGNS:  Temperature is currently 97, pulse 77, respirations 20,  and blood pressure is 146/63.  ABDOMEN:  Distended, soft, nontender (feels like air), he has minimal  bowel sounds, some of which are tympanic.   LABS:  Potassium of 3.9, BUN 17, and creatinine 1.15.  His white count  is 10.8, hemoglobin is currently 9.9 with an MCV value of 68.1.  His PT  is 17.3 INR is 1.4.  He did receive 2 units of packed red blood cells on  February 19, 2008, post surgery.  His hemoglobin in January 2009 was 12.5  with an MCV value of 66.4.  Anemia studies done at that time were within  normal limits.   Radiological exam was done yesterday.  An abdominal x-ray showed dilated  small bowel.  Findings declared ileus versus partial SBO.   Current medications in the hospital include an aspirin, heparin,  Coumadin n.p.o. and p.r.n. opiates.   ASSESSMENT:  Dr.  Molly Maduro Buccini has seen and examined the patient,  collected the history and reviewed his chart.  His impression is:  1. Questionable small bowel obstruction.  The patient is at risk      because of previous intra-abdominal surgery, but given absence of      pain or obstructive bowel sounds and occurrence after recent      surgery, I favor ileus.  Recommend supportive care, get potassium      to greater than 4.2, continue nasogastric suction, and follow KUB      daily.  2. With regards to anemia, recent exacerbation secondary to knee      surgery; chronic anemia, questionable secondary to thalassemia; no      likely gastrointestinal component; previous      esophagogastroduodenoscopy and colonoscopy were unremarkable; iron      studies normal; and heme cards repeatedly negative.  Recommend no      further action at present.  If anemia worsens, re-activate heme      involvement (Dr. Clelia Garner).   Thanks very much for this consultation.       Luke Police, PA    ______________________________  Bernette Redbird, M.D.    MLY/MEDQ  D:  02/26/2008  T:  02/27/2008  Job:  [id number]   cc:   Cherylynn Ridges, M.D.  Blenda Nicely. [name]  Bernette Redbird, M.D.

## 2010-12-27 NOTE — Op Note (Signed)
NAMEMERRICK, MAGGIO                     ACCOUNT NO.:  [id number]   MEDICAL RECORD NO.:  [id number]          PATIENT TYPE:  AMB   LOCATION:  DSC                          FACILITY:  [facility]   PHYSICIAN:  Robert A. Thurston Hole, M.D. DATE OF BIRTH:  21-Nov-1923   DATE OF PROCEDURE:  10/14/2007  DATE OF DISCHARGE:                               OPERATIVE REPORT   PREOPERATIVE DIAGNOSIS:  Left knee medial and lateral meniscal tears  with chondromalacia/pseudogout and synovitis.   POSTOPERATIVE DIAGNOSIS:  Left knee medial and lateral meniscal tears  with chondromalacia/pseudogout and synovitis.   PROCEDURE:  1. Left knee EUA followed by arthroscopic partial medial and lateral      meniscectomies.  2. Left knee chondroplasty with partial synovectomy.   SURGEON:  Elana Alm. Thurston Hole, M.D.   ASSISTANT:  Julien Girt, P.A.   ANESTHESIA:  Local with MAC.   OPERATIVE TIME:  30 minutes.   COMPLICATIONS:  None.   INDICATIONS FOR PROCEDURE:  Mr. Mccadden is an 77 year old gentleman who has  had a year of left knee pain, increasing in nature, with exam and x-rays  documenting meniscal tearing with chondromalacia and pseudogout and  synovitis.  He has failed conservative care and is now to undergo  arthroscopy.   DESCRIPTION OF PROCEDURE:  Mr. Guo was brought to the operating room on  October 14, 2007 after a knee block was placed in holding by anesthesia.  He was placed on the operating table in the supine position.  His left  knee was examined.  Range of motion 0-125 degrees, 1-2+ crepitation,  knee stable to ligamentous exam with normal patellar tracking.  The left  leg was prepped using sterile DuraPrep and draped using sterile  technique.   Originally, through an anterolateral portal, the arthroscope with a pump  attached was placed, and through an anteromedial portal, an arthroscopic  probe was placed.  On initial inspection of the medial compartment, he  had diffuse calcific articular cartilage  deposits and 75% grade 3  chondromalacia of the medial femoral condyle and medial tibial plateau  which was debrided.  The medial meniscus showed tearing of the posterior  and medial horn along with further pseudogouty deposits, and the torn  portions of the meniscus were resected back to a stable but degenerative  rim.  The inner condylar notch inspected.  Anterior and posterior  cruciate ligaments were normal.  The lateral compartment inspected.  Similar grade 3 chondromalacia over 50% which was debrided.  Lateral  meniscus had similar tearing.  Posterior and lateral horn 30% with  calcific deposits which were resected back to a stable rim.  Patellofemoral joint showed 25-30% grade 3 chondromalacia which was  debrided.  The patella tracked normally.  Moderate synovitis in the  medial and lateral gutters were debrided; otherwise, they were free of  pathology.  After this was done, it was felt that all pathology had been  satisfactorily addressed.  The instruments were removed.  The portals  were closed with 3-0 nylon suture and the knee injected with 0.25%  Marcaine with epinephrine  and 4 mg of morphine and 40 mg of Depo-Medrol  due to his synovitis and pseudogout.  Sterile dressings were applied.   The patient was awakened and taken to the recovery room in stable  condition.   FOLLOWUP CARE:  Mr. Yi will be followed overnight for observation due  to his sleep apnea and history of coronary artery disease which is  stable but needs to be monitored overnight.  He will be discharged  tomorrow if stable.  He will see me back in the office in a week for  sutures out and followup.  He will be discharged on Percocet for pain.      Robert A. Thurston Hole, M.D.  Electronically Signed     RAW/MEDQ  D:  10/14/2007  T:  10/14/2007  Job:  [id number]

## 2010-12-27 NOTE — Assessment & Plan Note (Signed)
[facility]                            CARDIOLOGY OFFICE NOTE   NAME:[name], [name]                            MRN:          [id number]  DATE:03/12/2008                            DOB:          07-Feb-1924    HISTORY OF PRESENT ILLNESS:  Mr. Steinke returns today after being  discharged from the hospital.  He had a total knee replacement by Dr.  Thurston Hole and then had some chest discomfort and shortness of breath.  His  cardiac enzymes peaked with a troponin of 1.48, peak CPK-MB at 163/8.4.   He underwent a cardiac catheterization on February 24, 2008.  This showed a  30% left main and a old chronic occlusion of the LAD, 20-30% diagonal,  circumflex was okay, RCA had a calcified eccentric 60-70% stenosis with  a 60% lesion proximally, EF 45%.  Films were reviewed by Dr. Riley Kill and  Juanda Chance, and it was felt that medical therapy was the best approach.   He has had no further angina.   Before his discharge, he had a small bowel obstruction that was treated  with NG suction and conservatively.  Fortunately, it resolved.   He also had some fecal occult blood that was positive and GI saw him  with Dr. Matthias Hughs.  It was felt that no further evaluation was needed at  the present time having recent evaluation.   During his hospital stay, he also had a CT of his chest, which showed no  pulmonary embolus.   His biggest complaint today is that of just dry cough.  He has been on  lisinopril for sometime and only on a low-dose, but that is probably the  only med that could be causing this.   CURRENT MEDICATIONS:  1. Enteric-coated aspirin 81 mg a day.  2. Coreg 12.5 b.i.d.  3. Colace 100 mg p.o. b.i.d.  4. Neurontin 600 mg p.o. t.i.d.  5. Lisinopril 2.5 mg a day.  6. Resource snack.  7. Ditropan 5 mg t.i.d.  8. Protonix 40 mg a day.  9. MiraLax daily.  10.Senokot b.i.d.  11.Zocor 20 mg a day.  12.Coumadin as directed.   His last INR at [facility] was 2.2 on March 03, 2008.   PHYSICAL EXAMINATION:  GENERAL:  He is chronically ill, but he looks  much better and stronger than discharge.  He is alert and oriented x3.  He is very sharp mentally.  SKIN:  Warm and dry.  VITAL SIGNS:  His blood pressure is 106/50, his pulse 72 and regular.  His weight is 176 down 12.  HEENT:  Unchanged.  NECK:  Carotids are full without bruits, no JVD.  Thyroid is not  enlarged.  Trachea is midline.  LUNGS:  Clear.  HEART:  Regular rate and rhythm.  No gallop.  ABDOMEN:  Soft.  EXTREMITIES:  Reveal no edema.  Pulses are intact.  NEURO:  Intact.   Electrocardiogram shows sinus rhythm with an old anterior septal infarct  pattern.   ASSESSMENT:  Mr. Leasure has dramatically improved since discharge.  Rehab  is  going well.  I have discontinued his lisinopril and hopes that his  cough will resolve.  We will see him back in about 4-6 weeks to check on  his blood pressure to see if he needs an angiotensin receptor blocker.  Otherwise, we have made no changes.     Thomas C. [name], MD, [facility]  Electronically Signed    TCW/MedQ  DD: 03/12/2008  DT: 03/13/2008  Job #: [id number]   cc:   Chales Salmon. Abigail Miyamoto, M.D.  Robert A. Thurston Hole, M.D.

## 2010-12-27 NOTE — Assessment & Plan Note (Signed)
[facility]                            CARDIOLOGY OFFICE NOTE   NAME:[name], [name]                            MRN:          [id number]  DATE:06/13/2007                            DOB:          [birth year]    I just received the precatheterization blood work on [name].  His INR  was 5.5.  His hemoglobin is 8.8.  He has microcytic indices.   I have had a nice chat with Dr. Abigail Miyamoto today.  We will postpone his  cath.  [name] wanted to stop his Coumadin anyway for a history of  remote TIAs.  We will stop the Coumadin and the aspirin at the present  time.  Dr. Abigail Miyamoto will pursue anemia evaluation.  I will wait to hear  back from him before proceeding further.   His cath can be put on hold because he was having exertional angina.  Perhaps this is now in retrospect due to his severe anemia.     Thomas C. [name], MD, [facility]  Electronically Signed    TCW/MedQ  DD: 06/13/2007  DT: 06/13/2007  Job #: [id number]   cc:   Chales Salmon. Abigail Miyamoto, M.D.

## 2010-12-27 NOTE — Consult Note (Signed)
NAMEJYREN, CERASOLI                     ACCOUNT NO.:  [id number]   MEDICAL RECORD NO.:  [id number]          PATIENT TYPE:  INP   LOCATION:  4733                         FACILITY:  [facility]   PHYSICIAN:  Gerrit Friends. Dietrich Pates, MD, FACCDATE OF BIRTH:  [birth year]   DATE OF CONSULTATION:  02/20/2008  DATE OF DISCHARGE:                                 CONSULTATION   PRIMARY CARE PHYSICIAN:  Cat Ta, MD   PRIMARY CARDIOLOGIST:  Jesse Sans. Wall, MD, [facility]   CHIEF COMPLAINT:  Chest pain and shortness of breath.   HISTORY OF PRESENT ILLNESS:  Mr. Hilbert is an 77 year old male with a  history of coronary artery disease.  He was admitted to the hospital for  a knee replacement on February 17, 2008.  Prior to admission, he had been  seen by Dr. [name] and consideration had been given to cardiac  catheterization.  However, he had significant anemia and required  evaluation.  Once his anemia improved, his chest pain also improved.  A  Myoview was performed which showed no ischemia, so he was considered at  acceptable risk for surgery.   Postoperatively, Mr. Gazda has done well.  He had some postoperative  anemia, but received 2 units of blood yesterday with Lasix in between.  Today, he was moving himself from the bed to the chair.  He stated that  this is the most exertional thing he has done since his surgery.  He had  chest pain with this that reached to 5-6/10 and was associated with  shortness of breath.  He says that this was his usual angina.  His  symptoms were relieved by rest and O2 in about 10 minutes.  He feels  that the effort he was making today was in line with the effort that was  required to being up on chest pain prior to admission.  This is the  first episode he has had since being in the hospital.  He cannot  remember exactly when the last episode was prior to admission, but knows  that he does not take nitroglycerin very often.  He has some orthopnea  which may be slightly worse than usual.  He  denies PND or edema.  He is  currently resting comfortably.   PAST MEDICAL HISTORY:  1. Status post cardiac catheterization in 1983 with the LAD totaled,      circumflex and RCA normal.  2. History of MI in 1979.  3. Peripheral vascular disease and cerebrovascular disease.  4. History of TIA.  5. History of peripheral neuropathy.  6. History of prostate cancer.  7. History of multiple Myoviews since his last cath with the last time      being in January 2009 showing scar but no ischemia and an EF of      37%.  8. Ischemic cardiomyopathy with an EF of 40-45% by echocardiogram in      2007.  9. Hypertension.  10.Hyperlipidemia.  11.History of gout.  12.History of iron deficiency and blood loss anemia.   SURGICAL HISTORY:  He is status  post cardiac catheterization as well as  left total knee replacement, EGD, remote colonoscopy, TURP, and prostate  cancer surgery, right knee surgery, left carotid endarterectomy,  cholecystectomy, and back surgery.   ALLERGIES:  PENICILLIN, SULFA, and DILAUDID.   MEDICATIONS:  At discharge were to be;  1. Zocor 20 mg a day.  2. Protonix 40 mg a day.  3. Ditropan 5 t.i.d.  4. Coumadin with the dose to be determined by pharmacy with DVT      Lovenox.  5. Colace and Senokot b.i.d.  6. Neurontin 600 t.i.d.  7. Percocet p.r.n.  8. Phenergan, Robaxin, and milk of magnesia p.r.n.   SOCIAL HISTORY:  He lives in [place] alone and is a widower.  He has  no history of alcohol, tobacco, or drug use.  He is retired.   FAMILY HISTORY:  His mother died at age 53 of bone cancer, but no  history of heart disease.  His father died at age 47 of a stroke, but  also had a history of coronary artery disease.  He has two of eight  siblings with heart disease.   REVIEW OF SYSTEMS:  He is slightly hard of hearing.  He has urinary  frequency.  He has chronic constipation which is worse since he has been  in the hospital.  He has chronic joint pain secondary to  degenerative  joint disease as well as incisional pain from the surgery.  He has had  no fevers or chills.  He denies hematemesis, hemoptysis, or melena.  He  has not had any orthopnea.   IMPRESSION:  He has not had any PND, edema, or palpitations.  Full 14-  point review of systems is otherwise negative.   PHYSICAL EXAMINATION:  VITAL SIGNS:  Temperature is 97.5, blood pressure  171/84, pulse 92, respiratory rate 20, O2 saturation 92% on room air.  GENERAL:  He is a well-developed elderly white male in no acute  distress.  HEENT:  Normal.  NECK:  He has mildly elevated JVD with no carotid bruits appreciated.  CHEST:  He has decreased breath sounds in the bases with some crackles.  CV:  His heart is regular in rate and rhythm with a soft systolic  murmur, but no S3.  ABDOMEN:  Firm and nontender with decreased bowel sounds, but they are  present.  SKIN:  He has minimal drainage at the incision with no rashes or lesions  noted.  NEURO:  He is alert and oriented with no focal deficits noted.  EXTREMITIES:  There is no edema noted.  His left lower extremity wound  is bandaged and dressed.  He has decreased pulses in bilateral lower  extremities, but they are warm and capillary refill is only slightly  delayed.  The left femoral bruit is appreciated, but not on the right.  He also has slightly decreased femoral pulses.  There is no cyanosis or  clubbing noted.  MUSCULOSKELETAL:  Except for the left knee, there is no joint  deformities or effusion and no spine or CVA tenderness.   LABORATORY VALUES:  Hemoglobin 10.4, hematocrit 32.7, WBCs 10.0, and  platelets 94.  Sodium 140, potassium 4.1, chloride 109, CO2 23, BUN 7,  creatinine 0.71, glucose 81, INR 1.4.  EKG:  Sinus tach at 100 with low  voltage and a PVC, prior AMI, but no acute changes.   IMPRESSION:  1. Chest pain:  Mr. Elman was seen today by Dr. Dietrich Pates.  His chest  discomfort is mild and similar to prior episodes.   Medical therapy      is recommended and if the chest pain recurs consideration can be      given to adding nitrates to his medication regimen.  2. Dyspnea on exertion:  His symptoms are somewhat worse and he has      soft signs of CHF.  A CT of the chest is already ordered and we      will review those results.  We will check a BNP level, but      empirically give Lasix 40 mg IV x1 now.  If his shortness of breath      improves, and he gets a bed assignment at the skilled nursing      facility he is requesting, he can possibly be discharged their this      afternoon or in a.m.      Theodore Demark, PA-C      Gerrit Friends. Dietrich Pates, MD, [facility]  Electronically Signed    RB/MEDQ  D:  02/20/2008  T:  02/20/2008  Job:  [id number]

## 2010-12-27 NOTE — Assessment & Plan Note (Signed)
[name]                            CARDIOLOGY OFFICE NOTE   NAME:[name], [name]                            MRN:          [id number]  DATE:04/21/2008                            DOB:          August 10, 1924    [name] returns today for close followup.  I saw him last on March 12, 2008, after discharge.  Please refer to that note for details.   He is now returned home from [place].  He is having no orthopnea, PND,  peripheral edema, or angina even with walking.  He walks with a cane.  His total left knee is doing beautifully.  Now, his right knee is giving  him trouble.  He says he will not have this operated on, however unless  he absolutely has to.   His meds were unchanged since last visit except I did hold his  lisinopril hoping that his cough would improve, it has not.  We will  start it back today, which is a very low dose at 2.5 mg a day.   PHYSICAL EXAMINATION:  VITAL SIGNS:  His blood pressure today is 116/70,  his pulse is 65 and regular, and his weight is 179, which is up 3  pounds.  HEENT:  Normal.  NECK:  Carotids are full.  Thyroid is not enlarged.  Trachea is midline.  There is no JVD.  LUNGS:  Clear.  HEART:  Soft S1 and S2.  No gallop.  ABDOMEN:  Soft.  Good bowel sounds.  EXTREMITIES:  No edema.  Pulses were present.  He has varicose veins.  No DVT.   ASSESSMENT/PLAN:  [name] is doing well.  I have made no changes in his  medical program except we will add back the low-dose lisinopril 2.5 mg a  day.  His ejection fraction is about 45%, so he can probably use this.  We will plan on seeing him back again in 3 to 6 months.     Luke C. [name], MD, [facility]  Electronically Signed    TCW/MedQ  DD: 04/21/2008  DT: 04/22/2008  Job #: [id number]

## 2010-12-27 NOTE — Discharge Summary (Signed)
NAMEHERMILO, [name]                     ACCOUNT NO.:  [id number]   MEDICAL RECORD NO.:  [id number]          PATIENT TYPE:  INP   LOCATION:  4733                         FACILITY:  [facility]   PHYSICIAN:  Thomas C. Wall, MD, FACCDATE OF BIRTH:  [birth year]   DATE OF ADMISSION:  02/17/2008  DATE OF DISCHARGE:  03/02/2008                               DISCHARGE SUMMARY   PROCEDURES:  1. Cardiac catheterization.  2. Coronary arteriogram.  3. Left ventriculogram.  4. CT angiogram of the chest.  5. Abdominal x-ray.  6. Left total knee replacement.   PRIMARY FINAL DISCHARGE DIAGNOSIS:  Left knee degenerative joint  disease.   SECONDARY DIAGNOSIS:  1. Non-ST segment elevation MI postoperatively.  2. Postoperative ileus.  3. Anemia of acute on chronic.  4. ALLERGY OR INTOLERANCE TO PENICILLIN, SULFA AND DILAUDID.  5. Status post cardiac catheterization in 1983 with the left anterior      descending totaled, otherwise no disease.  6. History of myocardial infarction in 1979.  7. History of peripheral vascular and cerebrovascular disease.  8. History of transient ischemic attack.  9. History of peripheral neuropathy.  10.History of prostate cancer.  11.Ischemic cardiomyopathy with an ejection fraction of 45% by      catheterization this admission.  12.Hypertension.  13.Hyperlipidemia.  14.History of gout.  15.History of iron deficiency.  16.Status post esophagogastroduodenoscopy, colonoscopy, transurethral      resection of prostate, prostate cancer surgery, right knee surgery,      left carotid endarterectomy, cholecystectomy and back surgery.  17.Deconditioning.   TIME AT DISCHARGE:  Forty-eight minutes.   HOSPITAL COURSE:  [name] is an 77 year old male with a history of  coronary artery disease.  He was evaluated by Dr. [name] in January and he  was having no ischemic symptoms so he was felt at acceptable risk for  the surgery.  He had left knee surgery in March, but had further  problems and came back to the hospital on February 17, 2008, for total knee  replacement.   For postoperative course, see the Discharge Summary dictated February 19, 2008.  On February 20, 2008, [name] developed chest pain and shortness of  breath.  He had elevation in his cardiac enzymes with a peak troponin I  of 1.48 and a peak CK-MB of 163/8.4.  He was taken to the cath lab on  February 24, 2008.  The cardiac catheterization showed left main 30%, LAD  chronic occlusion, diagonal 20-30%, circumflex okay and RCA had a  calcified eccentric 60-70% stenosis with 60% disease proximally and an  EF of 45%.  The films were evaluated by Dr. Eden Emms, Dr. Riley Kill and Dr.  Juanda Chance.  It was felt that this would be a difficult and prolonged  intervention.Marland Kitchen  He would need aspirin and Plavix afterwards for a month  with bare metal stents and longer if drug-eluting stents were used.  However, he also needs Coumadin because of his knee replacement.  The  consensus was that an initial course of medical therapy would be  appropriate with further evaluation  and percutaneous intervention  reserved for recurrent symptoms that were not controlled by medication  or after he was off Coumadin.  To that end, [name] is on a beta  blocker, a baby aspirin and an ACE inhibitor.  He will follow up with  cardiology as an outpatient with further treatment decisions made at  that time.   [name] had some GI issues post procedure.  He was anemic, with a  hemoglobin initially postprocedure 7.8.  He was transfused after that  and was followed closely.  At discharge, his hemoglobin was 10.8 with a  hematocrit of 33.  He also had an ileus and was seen by Dr. Matthias Hughs.  Dr. Matthias Hughs felt that he was unlikely to have a small bowel obstruction,  but this could be considered later if his symptoms did not improve with  bowel rest.  An iron profile was checked and was low at 34 so he will  receive supplementation.  He has had an EGD and colonoscopy  in the past  with the EGD being in February of this year.  Dr. Matthias Hughs did not feel  that any further GI workup was indicated at this time.   [name] also had some problems with volume overload.  At the time of his  MI, his BNP was elevated at 684.  A CT angiogram of the chest was  performed which showed small effusions, atelectasis and mild  cardiomegaly but no PE.  It was felt that the volume overload was in the  setting of having received 2 units blood and possibly some extra volume  during surgery.  He responded well to diuretics and his O2 saturation is  between 94 and 98% on room air at discharge.  [name] was seen by physical therapy and occupational therapy and will  follow up with them as an outpatient.  His ileus has resolved.  Because  of his deconditioning and other issues, it was felt the skilled nursing  was indicated so that he could get more intense rehab.  By March 02, 2008, orthopedics had cleared him for discharge to a skilled facility  and he was ready from a cardiology standpoint as well.  [name] is  considered stable for discharge to a skilled nursing facility on March 02, 2008.   DISCHARGE INSTRUCTIONS:  1. He is to see physical therapy for ambulation daily.  2. He is to see occupational therapy for ADLs.  3. He is to have TED hose to bilateral lower extremities daily, remove      daily for skin checks and reapply.  4. He can shower.  5. He is to have CPM from 0-90 degrees 6-8 hours a day for now.  6. He is to follow up with Dr. Thurston Hole in 2 weeks.  7. He is to follow up with Dr. [name] on July 30th at 1:45.  8. He is to have a 4 gram sodium fat-modified diet.  9. He is to have vital signs per routine.   DISCHARGE MEDICATIONS:  1. Aspirin 81 mg daily.  2. Coreg 12.5 mg b.i.d.  3. Colace 100 mg b.i.d., hold for diarrhea.  4. Neurontin 600 mg t.i.d.  5. Lisinopril 2.5 mg daily.  6. Resource wild berry liquid 240 mL daily as a snack.  7. Ditropan 5 mg t.i.d.   8. Protonix 40 mg daily.  9. MiraLax 17 grams daily, hold for diarrhea.  10.Senokot 1 tablet b.i.d., hold for diarrhea.  11.Zocor 20  mg daily.  12.Tylenol 650 mg q.4h. p.r.n. or Percocet 5/325 1-2 tabs q.4h. p.r.n.  13.Phenergan 12.5 mg p.o. q.6h. p.r.n.  14.Robaxin 500 mg q.6h. p.r.n.  15.Coumadin 2.5 mg daily.   Mr. Taglieri should have a Coumadin check on Thursday, drawn at the nursing  facility and faxed to the [facility].      Theodore Demark, PA-C      Jesse Sans. [name], MD, [facility]  Electronically Signed    RB/MEDQ  D:  03/02/2008  T:  03/02/2008  Job:  [id number]   cc:   Chales Salmon. Abigail Miyamoto, M.D.  Bernette Redbird, M.D.  Robert A. Thurston Hole, M.D.

## 2010-12-27 NOTE — Assessment & Plan Note (Signed)
[name]                            CARDIOLOGY OFFICE NOTE   NAME:[name], [name]                            MRN:          [id number]  DATE:06/12/2007                            DOB:          1923-09-15    CARDIOLOGY OUTPATIENT NOTE/OUTPATIENT CARDIAC CATHETERIZATION:   CHIEF COMPLAINT:  Exertional chest tightness and shortness of breath  over the past month or so.   HISTORY OF PRESENT ILLNESS:  [name] is a very active 77 year old  widowed white male who has been a former patient of Dr. Gabriel Rung [facility] [place].   He has a previous anterior wall infarction in 1979.  He had a  catheterization in 1987, at which time he had a total LAD.  He has been  on Coumadin ever since for apical wall motion abnormality.   Over the past several months, he has been having exertional chest  tightness and shortness of breath.  This happens with less than 200  feet.   Of note, he had a negative stress Myoview on May 03, 2006 when he  saw Dr. Corinda Gubler last.  He had an EF of 42%, akinesia of the anterior,  apex and septum.  He had no ischemia.  He also had a 2-D echocardiogram  which demonstrated an ejection fraction of 40-45%, akinesia of the mid  distal anterior septal wall, akinesia of the entire periapical wall, and  mild mitral regurgitation.  LV apex could not be visualized.   PAST MEDICAL HISTORY:  In addition to the above, he has had:  1. Peripheral vascular disease.  2. History of transient ischemic attacks, and this seems to be another      reason he is on Coumadin.  He had a right carotid endarterectomy in      1999.  3. He has hyperlipidemia.  4. Peripheral neuropathy which causes some unsteadiness of gait.  He      still walks on a regular basis doing cardiac rehabilitation.   His last carotid Dopplers were May 05, 2005 which showed a 40-59%  left internal carotid artery stenosis with a patent right carotid  endarterectomy site, antegrade flow in both  vertebrals.   CURRENT MEDICATIONS:  1. Gabapentin 1800 mg a day.  2. Enablex 15 mg daily.  3. Pravastatin 40 mg daily.  4. Coreg CR 10 mg a day (note that he ran out of this several days      ago).  5. Vitamin C 500 mg p.o. daily.  6. Baby aspirin 81 mg daily.  7. Coumadin as directed.  He strongly would like to come off of      Coumadin, and [facility] [place] if he can come off      Coumadin.   ALLERGIES:  He is intolerant:  1. SULFA.  2. PENICILLIN.  3. DILAUDID.   SURGICAL HISTORY:  1. He has had a penile implant.  2. He has had a previous cholecystectomy.  3. Right carotid endarterectomy.   FAMILY HISTORY:  See previous chart.   SOCIAL HISTORY:  He is  widowed.  He lives in [place].  He is very  active with his cardiac rehabilitation and other activities.  He enjoys  working in his shop.   REVIEW OF SYSTEMS:  Other than the HPI is negative.  He has been having  no symptoms of transient ischemic attacks.   PHYSICAL EXAMINATION:  GENERAL:  A very pleasant gentleman in no acute  distress.  VITAL SIGNS:  His blood pressure is 158/68, pulse 84 and regular.  Again, he is out of his Coreg.  He is 5 feet, 11.5 inches, weighs 188  pounds.  HEENT:  Normocephalic, but he has had trauma to the right side of his  temple and around his right eye from a fall from his peripheral  neuropathy.  Pupils are equal, round and reactive to light and  accommodation.  Extraocular movements intact.  Sclerae are clear.  Facial symmetry is normal.  NECK:  Carotid upstrokes are equal bilaterally with a carotid bruit on  the right.  He has got a carotid endarterectomy scar on the right.  Thyroid is not enlarged.  Trachea is midline.  LUNGS:  Clear.  HEART:  A slightly displaced PMI.  It is not dyskinetic.  Normal S1 and  S2.  No gallop.  ABDOMEN:  Soft, good bowel sounds.  No midline bruit, no hepatomegaly.  EXTREMITIES:  No clubbing, cyanosis, or edema.  Pulses were 2+/4+   posterior tibial, 1+/4+ dorsalis pedis.  His femoral pulses are 2+/4+  with bilateral femoral bruits.  NEUROLOGIC:  Grossly intact, except for his peripheral neuropathy.  SKIN:  A few ecchymoses.   Please note that he had an abdominal ultrasound in September of 2007, as  well, which showed atherosclerosis in the aorta, but no obstructive  disease.   ELECTROCARDIOGRAM:  EKG today shows normal sinus rhythm with an old  anterior wall infarction, a lot of artifact.  There are no acute  changes.   ASSESSMENT:  1. Exertional angina with known coronary artery disease.  Rule out new      obstructive disease.  Please see the above for extensive details.  2. Cerebrovascular disease with a history of transient ischemic      attacks on Coumadin.  He would very much like to come off the      Coumadin.  3. Peripheral vascular disease with bilateral femoral bruits.  4. Mild left ventricular dysfunction.  5. Previous anterior wall infarction with a totally occluded left      anterior descending by most recent catheterization.  6. Hypertension.  7. Hyperlipidemia.  8. Multiple surgeries.   I had a long talk with [name].  Will stop his Coumadin.  We renewed his  Coreg.  Will arrange for him to have an outpatient cardiac  catheterization.  Indications, risks and potential benefits were  discussed, and he agrees to proceed.   I would increase his aspirin to 325 a day.  We should follow up with a  carotid Doppler, since it has been 2 years since those were done.  I do  not feel strongly about him staying on Coumadin.  He would really like  to come off of it.     Thomas C. [name], MD, [facility]  Electronically Signed    TCW/MedQ  DD: 06/12/2007  DT: 06/12/2007  Job #: [id number]

## 2010-12-27 NOTE — Assessment & Plan Note (Signed)
[facility]                            CARDIOLOGY OFFICE NOTE   NAME:[name], [name]                            MRN:          [id number]  DATE:09/13/2007                            DOB:          1923-12-12    Mr. Dubois comes today for preoperative clearance for a possible left knee  surgical procedure with Dr. Thurston Hole.   I saw him back in October 2008 to assume his care is a cardiologist when  Dr. Glennon Hamilton retired.  He was having chest tightness and dyspnea on  exertion that time.   With his coronary history I set him up for heart catheterization.  His  hemoglobin came back at 8.8.  We decided to pursue the anemia evaluation  first before he anything else.   I do not have any of that evaluation, but I am told that Dr. Matthias Hughs  from GI found no obvious source.  His last hemoglobin was 13.8 in  July 15, 2007.  I have my in my chart.   No further angina or dyspnea on exertion.   Without MI we did a stress Myoview September 06, 2007, EF 37%, previous  large anterior septal and anterior apical MI but no ischemia.  This is  not significant change from his previous evaluations.   I added lisinopril 10 mg a day along with his Carvedilol for preventing  congestive heart failure.  He is due a CHEM-7 today.   He looks remarkably good today.  He denies orthopnea, PND.  He is having  no angina.   His blood pressure is 116/70, his pulse 68 rate, weighs 180 pounds.  HEENT:  Unchanged.  NECK:  Shows no JVD.  Carotids are full without bruits.  LUNGS:  Clear.  HEART:  Reveals no S3 gallop.  Abdominal exam is soft, good bowel sounds.  EXTREMITIES:  Reveal 1+ edema.  Pulses were present.  NEURO:  Exam is intact.   I have written a note of surgical clearance for Mr. Cavell.  The patient is  going to see Dr. Matthias Hughs before he is totally cleared.  I would like to  know when he comes in the hospital so our team can check on him.     Thomas C. [name], MD, [facility]  Electronically Signed    TCW/MedQ  DD: 09/13/2007  DT: 09/13/2007  Job #: [id number]   cc:   Chales Salmon. Abigail Miyamoto, M.D.  Robert A. Thurston Hole, M.D.

## 2010-12-27 NOTE — Discharge Summary (Signed)
[name], [name]                     ACCOUNT NO.:  [id number]   MEDICAL RECORD NO.:  [id number]          PATIENT TYPE:  INP   LOCATION:  4733                         FACILITY:  [facility]   PHYSICIAN:  Thomas C. Wall, MD, FACCDATE OF BIRTH:  1924/05/11   DATE OF ADMISSION:  02/17/2008  DATE OF DISCHARGE:  02/28/2008                               DISCHARGE SUMMARY   The patient will be going to skilled nursing facility today.   PRIMARY CARE PHYSICIAN:  Dr. Abigail Miyamoto of [facility].   PRIMARY CARDIOLOGIST:  Jesse Sans. [name], MD, FACC   CONSULTATIONS:  Dr. Matthias Hughs of GI, [name] of General Surgery, and  also Dr. Thurston Hole, Orthopedic Surgeon.   PROCEDURES PERFORMED DURING HOSPITALIZATION.:  1. Cardiac catheterization completed on February 24, 2008 by Dr. Charlton Haws.      a.     Right coronary artery is dominant, generic 100% proximal       lesion in the left anterior descending, calcified 60% proximal       lesion right coronary artery, and calcified 70% distal lesion in       the right coronary artery.  Recommendations to treat medically.   FINAL DISCHARGE DIAGNOSES:  1. Non-ST-elevated myocardial infarction.  2. Status post left total knee replacement per Dr. Thurston Hole on February 17, 2008.  3. Small bowel obstruction with ileus.  4. Urinary tract infection.  5. Hypertension.  6. History of peptic ulcer disease.  7. History of anemia, iron deficiency.  8. Peripheral vascular disease and cerebrovascular disease with      history of transient ischemic attack.  9. Peripheral neuropathy.  10.Ischemic cardiomyopathy with an ejection fraction of 40-45% by      echocardiogram in 2007.      a.     Cardiac catheterization reveals an ejection fraction of 50%.  11.Hyperlipidemia.  12.History of gout.  13.History of iron deficiency and blood loss anemia.   HOSPITAL COURSE:  This is an 77 year old Caucasian male with multiple  medical problems who we were asked to see while the patient was  in rehab  at [facility], status post left knee replacement secondary to  chest pain which occurred during his recovery.  The patient experienced  the chest pain exertionally, it was relieved by rest.  The patient was  seen initially and examined by Dr. [place] Bing.  His cardiac enzymes  were cycled, and the patient was admitted to telemetry for further  evaluation.  The patient was placed on heparin, and Coumadin was placed  on hold.  Cardiac enzymes were found to be positive with initial  troponin of 1.48 trending downward to 0.97 and 0.76.  The patient was  also found to be mildly anemic with a hemoglobin of 10.4.  The patient  continued to have some mild chest discomfort and consideration for  repeat cardiac catheterization was discussed.  However, the patient's  INR was not subtherapeutic to allow for catheterization.  The patient  was monitored closely for the next 3  days on heparin with planned  cardiac catheterization when PT/INR was in safer limits.  In the  interim, the patient was treated with nitroglycerin for chest  discomfort.   The patient also had a UA completed and was found to be positive for E.  coli UTI and was placed on Cipro IV.  CT scan was also completed and  found to be negative for pulmonary emboli.  The patient was continued on  Coreg for hypertension and ischemic cardiomyopathy with blood pressure  remaining stable.   The patient did undergo cardiac catheterization per Dr. Charlton Haws on  February 24, 2008.  Please see Dr. Fabio Bering serial cardiac catheterization  notes for more details.  The patient was found not to be a candidate for  intervention.  The patient had films reviewed by Dr. Fabio Bering colleagues  and it was felt best that the patient be treated medically.   The patient began to have some abdominal pain, and x-ray was completed.  The patient was found to have small bowel obstruction with ileus.  The  patient's heparin was continued, and  Coumadin was restarted.  In the  interim, a surgical consult was called to have further evaluation  secondary to the small bowel obstruction.  He was seen by [name].  NG  tube was placed, and the patient was followed without surgery.  The  patient was medically managed without evidence of bleed or necrosis.  However, the patient's hemoglobin did drop into 8 g range and the  patient was given 2 units of packed red blood cells with Lasix in  between.  Hemoglobin normalized and ileus did resolve by February 27, 2008.   The patient was also followed closely by Dr. Thurston Hole and his PA  concerning the total knee replacement.  It was advised that Arts development officer and case manager become involved for post-discharge skilled  nursing facility for continuation of OT and PT.  On the 16th, the  patient's NG tube was removed, and the patient was able to eat without  difficulty.  The patient was able to have bowel movements that were  Hemoccult negative.  Surgery did sign off on the 16th as the patient had  stabilized and had no further need for followup.  The patient also was  seen by Dr. Matthias Hughs, GI specialist, who felt that the patient had no  further need for GI workup or invasive diagnostic testing and Dr.  Matthias Hughs signed off on the 17th.   The patient continued on p.o. Coumadin with a final INR of 1.6.  The  patient's heparin was discontinued.  Clinical social worker did see the  patient on the 17th and noted possible discharge to skilled nursing  facility.  At the time of this dictation, the case worker is following  up with the patient's insurance company partners to see if they will  approve a skilled nursing facility stay.  We are awaiting that and if it  is approved, the patient will be discharged to [facility].   A followup hemoglobin and hematocrit and CBC were completed on day of  discharge with a hemoglobin of 9.9, hematocrit of 31.2, and white blood  cells of 8.3  with platelets of 322.  At that time, Dr. [name] felt that  the patient was stable for discharge, and we are awaiting skilled  nursing facility placement if the insurance is approving.   PHYSICAL EXAMINATION:  DISCHARGE VITAL SIGNS:  Blood pressure 127/64,  heart  rate 67, respirations 18, and temperature 97.9.   DISCHARGE LABORATORY DATA:  CBC as listed above.  PT 19.3 and INR 1.6.  Iron level 34, total iron-binding capacity 213, 16% saturation, and  ferritin 581.  Sodium 137, potassium 4.7, chloride 103, CO2 25, glucose  154, BUN 9, and creatinine 1.05.   DISCHARGE MEDICATIONS:  1. Enteric-coated aspirin 81 mg daily.  2. Zocor 20 mg daily or Pravastatin 40 mg daily.  3. Ditropan 5 mg 3 times a day.  4. Coreg 12.5 mg twice a day.  5. Colace 100 mg twice a day.  6. Senokot twice a day.  7. Neurontin 600 mg 3 times a day.  8. Lisinopril 2.5 mg daily (new lower dose, decreased from 10 mg      daily).  9. Coumadin as directed (currently going home on 5 mg daily).  10.Prilosec 20 mg daily.  11.Nitroglycerin sublingual p.r.n., recurrent chest pain.   ALLERGIES:  PENICILLIN and SULFA along with HYDROMORPHONE.   FOLLOWUP PLANS AND APPOINTMENT:  1. The patient is scheduled with a followup post-surgical appointment      with Dr. Thurston Hole on Monday, March 02, 2008 at 3:45 p.m.  2. The patient is to follow up with Dr. [name] on Thursday, March 12, 2008 at 2:00 p.m.  3. The patient has been given post-cardiac catheterization      instructions with particular emphasis on the right groin site for      evidence of bleeding, hematoma, or signs of infection.  4. The patient is to follow up with Dr. Thurston Hole concerning PT/INR on      Coumadin studies or being drawn at skilled nursing facility with      follow up with Dr. Thurston Hole and reports delivered to him.  5. The patient is to follow up with primary care physician for      continued medical management.   Time spent with the patient to include  physician time, 1 hour.      Bettey Mare. Lyman Bishop, NP      Jesse Sans. [name], MD, [facility]  Electronically Signed    KML/MEDQ  D:  02/28/2008  T:  02/29/2008  Job:  [id number]   cc:   Chales Salmon. Abigail Miyamoto, M.D.  Thomas C. [name], MD, [facility]  Bernette Redbird, M.D.  Cherylynn Ridges, M.D.  Robert A. Thurston Hole, M.D.

## 2010-12-27 NOTE — Op Note (Signed)
[name], [name]                     ACCOUNT NO.:  [id number]   MEDICAL RECORD NO.:  [id number]          PATIENT TYPE:  AMB   LOCATION:  ENDO                         [facility]ACILITY[facility]  [facility]   PHYSICIAN:  Bernette Redbird, M.D.   DATE OF BIRTH:  [birth year]   DATE OF PROCEDURE:  10/03/2007  DATE OF DISCHARGE:                               OPERATIVE REPORT   PROCEDURE:  Upper endoscopy.   INDICATION:  Anemia in an 77 year old gentleman with a basically  negative colonoscopy in the past couple of years.   FINDINGS:  Normal exam.   PROCEDURE:  The patient provided written consent for the procedure.  Sedation was by anesthesia and consisted of fentanyl and Versed.  They  were on standby to use propofol but the patient responded so well to  very small doses of Demerol and Versed, in view of some obstructive  sleep apnea and resting hypoxemia prior to passage of the scope, we felt  it was best to try the exam without deeper sedation and indeed he  tolerated it extremely well.  The Pentax video endoscope was passed  under direct vision.  The vocal cords looked normal.  The esophagus was  readily entered and was normal in its entirety without evidence of  reflux esophagitis, Barrett's esophagus, varices, infection, neoplasia  or any ring, stricture or hiatal hernia.   The stomach contained a small bilious residual.  There was some  nonerosive erythema in the antral region. No ulcers, erosions, polyps or  masses were seen in the stomach including retroflexed view of the  cardia.  The pylorus, duodenal bulb and second duodenum looked normal.   Careful inspection disclosed no abnormalities which would account for  anemia, so the scope was removed from the patient who tolerated the  procedure well and without apparent complications.   IMPRESSION:  Normal endoscopy in a patient who is on aspirin but only  occasional intermittent use of Prilosec, with heme-negative stool.   RECOMMENDATIONS:  No further  GI workup.  Presumably the anemia is  related to chronic disease.  We will start him on iron to see if it  might help improve the hemoglobin a bit.           ______________________________  Bernette Redbird, M.D.     RB/MEDQ  D:  10/03/2007  T:  10/03/2007  Job:  [id number]   cc:   Chales Salmon. Abigail Miyamoto, M.D.  Fax: [phone]

## 2010-12-27 NOTE — Cardiovascular Report (Signed)
NAMEEULISES, [name]                     ACCOUNT NO.:  [id number]   MEDICAL RECORD NO.:  [id number]          PATIENT TYPE:  INP   LOCATION:  4733                         FACILITY:  [facility]   PHYSICIAN:  Peter C. Eden Emms, MD, FACCDATE OF BIRTH:  13-Dec-1923   DATE OF PROCEDURE:  DATE OF DISCHARGE:                            CARDIAC CATHETERIZATION   CARDIAC CATH.:  An 77 year old patient with distant history of anterior wall MI back in  1987.  The patient has known anterior apical akinesis by Myoview.  He is  status post left knee replacement with a mild bump in his enzymes.   Catheterization was done to further define anatomy.   Cine catheterization was done with 7-French catheters from the right  femoral artery.   Left main coronary had 30% discrete stenosis.   Left anterior descending artery had a chronic occlusion of the proximal  portion after the takeoff of a large first diagonal branch.  There was  significant left-to-left collaterals and minimal right-to-left  collaterals.  First diagonal branch had 20-30% multiple lesions.   The circumflex coronary artery primarily consisted of a large obtuse  marginal branch in the AV groove branch.  There was no significant  disease.   The right coronary artery was dominant.  There was 60% calcific disease  proximally.  Mid vessel had 30-40% multiple lesions.  The distal vessel  had a calcified eccentric area of 60-70% stenosis.  The PDA was normal.  Posterolateral branch had 30 40% multiple lesions.   RAO ventriculography, RAO ventriculography showed anterior apical wall  akinesis.  EF was in the 45% range.  There was no mural apical thrombus.   LV pressure was 165/20.  Aortic pressure was 168/88.   IMPRESSION:  The films were reviewed with Dr. Riley Kill.  The patient does  not have acute lesions in the right coronary artery.  He may have  elevated his enzymes mildly due to insufficient collaterals to his left  anterior descending.  The  problem with intervening in his right coronary  artery at this stage would be the following.  The patient would need  stents from his proximal portion all the way down to the distal portion.  He would need to have a non drug-eluting stents at this stage.  He would  also have to be on Coumadin, aspirin and Plavix for at least a month.   I will also review the films with Dr. Excell Seltzer and Juanda Chance.  It is possible  that an initial course of medical therapy with close followup with Dr.  [name] would be in order with staged intervention for recurrent symptoms  once the patient is no longer needed to be on Coumadin post left knee  replacement.   The patient tolerated the procedure well.   Addendum:  The films were also reviewed by Dr. Juanda Chance who agreed with an  initial course of medical therapy.  Intervention at a time when the  patient does not need coumading would be much better.      Noralyn Pick. Eden Emms, MD, [facility]  Electronically Signed  PCN/MEDQ  D:  02/24/2008  T:  02/24/2008  Job:  [id number]

## 2010-12-27 NOTE — H&P (Signed)
[name], [name]                     ACCOUNT NO.:  [id number]   MEDICAL RECORD NO.:  [id number]          PATIENT TYPE:  OBV   LOCATION:  0104                         FACILITY:  [facility]   PHYSICIAN:  Michiel Cowboy, MDDATE OF BIRTH:  Jan 13, 1924   DATE OF ADMISSION:  09/01/2007  DATE OF DISCHARGE:                              HISTORY & PHYSICAL   PRIMARY CARE PHYSICIAN:  Dr. Abigail Miyamoto   GASTROENTEROLOGIST:  Dr. Matthias Hughs   CHIEF COMPLAINT:  Nausea, vomiting.   HISTORY OF PRESENT ILLNESS:  This is an 77 year old gentleman with  history of peptic ulcer disease, hypertension, hyperlipidemia, who comes  in with nausea and vomiting for the past 4 days.  The patient was doing  well up until Friday, which was about 3 days ago, when he presented to  his primary care doctor for reevaluation of his chronic anemia as well  as reevaluation of his chronic pain of his knee, was given some  Darvocet.  Later on he also went to eat out at the restaurant where he  had some chicken tenders.  Shortly thereafter, the patient developed  nausea and vomiting productive of bilious vomitus passed for about 10-15  times per 24 hours.  The patient presented to the emergency department  where he was given IV fluids and sent home with a prescription for  Zofran.  The patient continued to have some nausea and vomiting at home  and felt very weak this morning, so he re-presented again to the  emergency department.  At this point the decision was made that he will  need to have an admission.  [facility] called for an admission.   SOCIAL HISTORY:  The patient used to smoke but not currently.  Denies  alcohol.  No IV drug use.  Lives at home but son and daughter-in-law  close.   FAMILY HISTORY:  Noncontributory.   ALLERGIES:  1. DILAUDID.  2. PENICILLIN.  3. SULFA.   MEDICATIONS:  1. Aspirin 325 mg p.o. daily.  2. Coreg dose of which he is unsure.  3. Lovastatin unsure of the dose.  4. Neurontin 600  mg p.o. three times a day.   Per discharge summary, his lovastatin dose was 10 mg p.o. daily.  He was  also on hydrochlorothiazide 12.5 mg p.o. daily but unsure if he takes  any of that now.   PAST MEDICAL HISTORY:  1. Coronary artery disease.  2. History of carotid endarterectomy.  3. Hyperlipidemia.  4. History of prostate cancer.  5. Status post cholecystectomy.  6. History of back operation.  7. History of colon polyps.  8. Anemia of iron deficiency, being evaluated by Dr. Edwards/Dr.      Matthias Hughs.  9. Peripheral neuropathy.   PHYSICAL EXAMINATION:  VITAL SIGNS:  Temperature 98.3, pulse 68,  respirations 22, blood pressure 145/66, saturating 93% on room air.  GENERAL:  Alert, in no acute distress.  HEAD:  Nontraumatic.  Dryish mucous membranes.  LUNGS:  Distant breath sounds bilaterally, slightly decreased breath  sounds at the bases.  ABDOMEN:  Nontender, nondistended.  Normal bowel sounds.  HEART:  Regular rate and rhythm.  No murmurs, rubs, or gallops.  LOWER EXTREMITIES:  Without edema.  NEUROLOGIC:  Intact.   LABORATORIES:  White blood cell count 6.6, hemoglobin 11.6, platelets  157.  Sodium 137, creatinine 1.05.  UA negative.   ASSESSMENT AND PLAN:  This is an 77 year old gentleman with dehydration  secondary to nausea and vomiting.   1. Nausea and vomiting.  Will admit to observation.  Will rehydrate      with IV fluids, check orthostatics.  Control nausea with Zofran and      Neurontin.  Will attempt to figure out the cause of nausea.  Given      that the patient has no abdominal symptoms, will obtain CAT scan of      the head to rule out central causes of nausea.  Will also obtain a      KUB since the patient has history of ileus in the past and has not      had a bowel movement since Friday.  Will also check lipase and CMET      as well as anemia panel.  2. History of peptic ulcer disease, very remote.  Will follow H&H but      no history of hematemesis or  melena.  Consider getting GI opinion      if his symptoms persist.  3. Coronary artery disease, stable.  Continue lovastatin.  4. Peripheral neuropathy.  Will continue Neurontin.  5. Prophylaxis.  SCDs and Protonix.  6. Code status:  The patient wishes to be DNR/DNI.      Michiel Cowboy, MD  Electronically Signed     AVD/MEDQ  D:  09/01/2007  T:  09/01/2007  Job:  [id number]   cc:   Fayrene Fearing L. Malon Kindle., M.D.  Fax: [phone]   Bernette Redbird, M.D.  Fax: [phone]   Chales Salmon. Abigail Miyamoto, M.D.  Fax: [phone]

## 2010-12-27 NOTE — Discharge Summary (Signed)
[name], [name]                     ACCOUNT NO.:  [id number]   MEDICAL RECORD NO.:  [id number]          PATIENT TYPE:  INP   LOCATION:  3742                         FACILITY:  [facility]   PHYSICIAN:  Bevelyn Buckles. Bensimhon, MDDATE OF BIRTH:  08/04/1924   DATE OF ADMISSION:  12/18/2008  DATE OF DISCHARGE:  12/20/2008                               DISCHARGE SUMMARY   PROCEDURES:  1. Cardiac catheterization.  2. Coronary arteriogram.  3. V/Q scan.   PRIMARY FINAL DISCHARGE DIAGNOSIS:  Acute diastolic congestive heart  failure/pulmonary edema.   SECONDARY DIAGNOSES:  1. Non-ST segment elevation myocardial infarction.  2. Remote history of an anterior myocardial infarction in 1970s.  3. History of a non-ST segment elevation myocardial infarction in July      2009 after knee replacement.  Medical therapy recommended.  4. Peripheral vascular disease.  5. Cerebrovascular disease.  6. History of transient ischemic attack.  7. Hypertension.  8. History of hyperlipidemia with total cholesterol 129, triglycerides      150, HDL 24, LDL 75 this admission.  9. Gout.  10.History of ischemic cardiomyopathy with an ejection fraction of      45%, July 2009, echocardiogram ordered, but not done.  11.Peripheral neuropathy.  12.Iron deficiency anemia.  13.History of prostate cancer.  14.Allergy or intolerance to PENICILLIN, SULFA, and DILAUDID.  15.Family history of coronary artery disease in his father and      siblings.   TIME OF DISCHARGE:  44 minutes.   HOSPITAL COURSE:  [name] is an 77 year old male with known coronary  artery disease.  He was relatively inactive on the day of admission and  had sudden onset of shortness of breath.  He came to the hospital and  was admitted for further evaluation.   He had EKG changes and elevated cardiac enzymes indicating a non-ST  segment elevation MI.  Cardiac catheterization showed left main 20%, LAD  known to be occluded, first diagonal 30%, circumflex  30%, RCA 70% which  was unchanged.  Dr. Clifton James evaluated the films and felt that his  cardiac catheterization was not changed from the previous cath in July  2009.  He felt that the pulmonary edema was likely secondary to  diastolic dysfunction.  He was diuresed with IV Lasix.   A 2-D echocardiogram was ordered and reported completed, but no results  are available.  He had an elevated D-dimer, so a V/Q scan was performed,  which showed low likelihood for pulmonary embolism.  His shortness of  breath improved with diuresis and by discharge, his O2 saturation was  95% on room air with ambulation.  [name] denied any dietary  indiscretions or other stressors that might have triggered his pulmonary  edema.  He was not on daily Lasix prior to admission and initially this  was considered; however, he had an elevation in his BUN and creatinine  after the heart catheterization and IV Lasix, so this is on hold for  now.   On Dec 20, 2008, [name] was evaluated by Dr. Gala Romney.  There was  concern  for small vessel disease being the cause for his MI and the  inciting event and therefore, he will be placed on Plavix.  Because of  the renal insufficiency with a BUN of 38 and creatinine 1.63, GFR of 41  at discharge, no daily Lasix is indicated at this time.  On admission,  his BUN was 21, creatinine 1.22, GFR 57, so he is to get a BMET on  Tuesday and follow up with Dr. [name].  [name] was ambulating without  chest pain or shortness of breath.  He was evaluated by Dr. Gala Romney  and considered stable for discharge with close outpatient followup.   DISCHARGE INSTRUCTIONS:  His activity level is to be increased  gradually.  He is to call our office for problems with the cath site.  He is to stick to a low-sodium heart-healthy diet.  He is to follow up  with Dr. [name] and our office will call him.  He is to get a BUN Tuesday  and the office will call.  He is to follow up with Dr. Abigail Miyamoto as   needed.   DISCHARGE MEDICATIONS:  1. Aspirin 325 mg daily.  2. Plavix 75 mg daily.  3. Gabapentin 900 mg b.i.d.  4. Losartan is on hold.  5. Lisinopril 5 mg 1/2 tablet daily.  6. Coreg 1/2 tablet b.i.d. (dose unclear).  7. Nitroglycerin sublingual p.r.n.      Theodore Demark, PA-C      Bevelyn Buckles. Bensimhon, MD  Electronically Signed    RB/MEDQ  D:  12/20/2008  T:  12/20/2008  Job:  [id number]   cc:   Chales Salmon. Abigail Miyamoto, M.D.

## 2010-12-27 NOTE — Cardiovascular Report (Signed)
[name], [place] NO.:  [id number]   MEDICAL RECORD NO.:  [id number]          PATIENT TYPE:  INP   LOCATION:  2907                         FACILITY:  [facility]   PHYSICIAN:  Verne Carrow, MDDATE OF BIRTH:  May 20, 1924   DATE OF PROCEDURE:  12/18/2008  DATE OF DISCHARGE:                            CARDIAC CATHETERIZATION   PRIMARY CARDIOLOGIST:  Maisie Fus C. Wall, MD, [facility]   PROCEDURE PERFORMED:  1. Left heart catheterization.  2. Selective coronary angiography.   OPERATOR:  Verne Carrow, MD   INDICATIONS:  This is an 77 year old Caucasian male with a history of  known coronary artery disease who has complained of progressive  shortness of breath over the last several days and woke up this morning  with acute onset of shortness of breath.  The patient called EMS and  upon arrival was found to have subtle inferior EKG changes.  A code  STEMI was activated and the patient was brought in emergently to the  Cath Lab at [facility].  Upon arrival, a 12-lead EKG showed  very subtle changes in the inferior leads of the EKG.  The patient was  having no chest pain but appeared to be in pulmonary edema and was short  of breath.  An emergent left heart catheterization was performed.   DETAILS OF PROCEDURE:  The patient was brought into the Cath Lab  emergently and emergency consent was obtained.  The right groin was  prepped and draped in a sterile fashion.  Lidocaine 1% was used for  local anesthesia.  A 6-French sheath was inserted into the right femoral  artery without difficulty.  Standard diagnostic catheter was used to  perform selective coronary angiography.  A pigtail catheter was used to  measure pressures in the left ventricle.  No left ventricular angiogram  was performed secondary to the patient's presentation with acute  pulmonary edema.  There was no significant gradient noted across the  aortic valve on pullback of the catheter.  The  patient tolerated the  procedure well and was taken to the holding area in stable condition.   ANGIOGRAPHIC FINDINGS:  1. The left main coronary artery has mild 20% plaque.  2. The left anterior descending is known to have a chronic total      occlusion in the midportion of the vessel just beyond the first      moderate-to-large size diagonal branch.  This appears unchanged      from the prior cath in July 2009.  The first diagonal is a moderate-      sized vessel that has a 30% plaque noted diffusely throughout the      vessel.  The distal left anterior descending coronary artery fills      faintly from left-to-left collaterals.  3. The circumflex artery primarily consists of a large marginal      branch.  There is diffuse 20-30% disease throughout this vessel.      There are no severely obstructive lesions noted.  4. The right coronary artery is a large dominant vessel that has  a      proximal 60-70% stenosis that appears unchanged from prior cath.      The midportion of the vessel has serial 40-50% stenoses.  The      distal portion of the vessel has a calcific 60-70% stenosis that      appears unchanged from prior cath.  The posterior descending artery      posterolateral branch has mild plaque disease only.   HEMODYNAMIC DATA:  Central aortic pressure 131/63.  Left ventricular  pressure 116/14.  Left ventricular end-diastolic pressure 30.   No left ventricular angiogram was performed.   IMPRESSION:  1. Double-vessel coronary artery disease that appears stable and      unchanged from cath in July 2009.  2. Acute pulmonary edema that is likely secondary to diastolic      dysfunction.  The patient's blood pressure was elevated to 170      systolically when he arrived in Cath Lab.   RECOMMENDATIONS:  The patient will be admitted to the CCU and monitored  closely.  We will continue diuresis with IV Lasix.  Of note, the patient  was given 40 mg of IV Lasix upon arrival to the Cath  Lab and noted  almost immediate improvement in his shortness of breath when he began to  diurese.  We will check an echocardiogram and will also check an x-ray.  Laboratory values will include a D-dimer, cardiac enzymes, coagulation  profile, metabolic profile, CBC and a BNP.  Further planning pending the  patient's response to intravenous Lasix.      Verne Carrow, MD  Electronically Signed     CM/MEDQ  D:  12/18/2008  T:  12/18/2008  Job:  [id number]   cc:   Thomas C. Wall, MD, [facility]

## 2010-12-27 NOTE — Discharge Summary (Signed)
[name], [name]                     ACCOUNT NO.:  [id number]   MEDICAL RECORD NO.:  [id number]          PATIENT TYPE:  INP   LOCATION:  5038                         FACILITY:  [facility]   PHYSICIAN:  Robert A. Thurston Hole, M.D. DATE OF BIRTH:  Jan 02, 1924   DATE OF ADMISSION:  02/17/2008  DATE OF DISCHARGE:  02/21/2008                               DISCHARGE SUMMARY   FINAL DIAGNOSIS FOR THIS ADMISSION:  End-stage degenerative joint  disease of the left knee.   PROCEDURE WHILE IN HOSPITAL:  Left total knee arthroplasty.   DISCHARGE SUMMARY:  The patient is an 77 year old male community  ambulator with end-stage DJD of the left knee.  He has failed  conservative care and pain now prevents easy ambulation and interferes  with ADLs, and after discussing risks versus benefits, he wishes to  proceed with a total knee arthroplasty.   The patient's medical history is significant for allergies to SULFA,  PENICILLIN and DILAUDID.   Medications at the time of admission, pravastatin, Prilosec, gabapentin,  oxybutynin, carbidopa, and vitamin C.   He has a history of MI, history of anemia, history of TIA, that is why  he has been on chronic Coumadin stopped before surgery, peripheral  vascular disease with bilateral femoral bruits, hypertension, and  bilateral lower extremity neuropathy.   Surgical history is significant for cholecystectomy, penile implant,  carotid enterectomy, and left knee arthroscopy on October 14, 2007.   Family history is significant for his mother who died of bone cancer.  Father died of complications of CVA and coronary artery disease.   The patient's review of symptoms, he denies any shortness of breath or  chest pain.  He has decreased hearing, decreased vision for he wears  glasses, frequent bladder elimination, constipation, denies all others.   PHYSICAL EXAMINATION:  GENERAL:  The patient is a well-developed, well-  nourished 77 year old.  VITALS:  Temperature 96, pulse  70, oxygenation 95% on room air, blood  pressure 131/63.  He is a 5 feet 11 inches, 186-pound male.  HEENT:  Head is normocephalic and atraumatic.  Ears, TMs are clear.  Eyes, pupils equal, round, and reactive to light and accommodation.  NECK:  Supple, full range of motion.  CHEST:  Clear to auscultation.  HEART:  Normal sinus rhythm, regular rate and rhythm.  ABDOMEN:  Soft and nontender with positive bowel sounds.  EXTREMITIES:  Left knee range of motion 0-120 degrees with 2+ crepitus,  1+ effusion.  SKIN:  Warm and dry.  No rash or abrasions.   Preoperative labs including CBC, CMET, chest x-ray, EKG, PT, and PTT  were within normal limits.   HOSPITAL COURSE:  On the date of admission, the patient was taken to the  operating room where he underwent a left knee replacement using DePuy  cemented total knee system with a #4 cemented femur and #5 cemented  tibia with 12.5-mm polyethylene RP tibial spacer and a 38-mm  polyethylene cemented patella.  The patient was placed on perioperative  antibiotics.  He was placed on postoperative Coumadin prophylaxis and he  was placed on a PCA morphine for pain control and physical therapy was  begun in the PACU.  The patient was doing well.  He had already  discussed going to [facility] in [place] for his skilled nursing  facility stay due to being alone at home and FL-2 form was signed.  T-  max on that day was 100.1, ranging 98.9; blood pressure, pulse, and  respirations steady; hemoglobin 9.2, WBC 10.9; INR 1.4.  No significant  drainage.  Otherwise stable.  PCA was discontinued.  Foley was  discontinued.  Postoperative day #2, the patient was complaining of  moderate pain, poor appetite, no nausea or vomiting, hemoglobin had  decreased to 7.8, WBC decreased to a normal of 7.8, INR 1.6 with a PT of  19.4.  Routine chemistry was all within normal limits with the exception  of BUN of 26 and GFR of 55.  The patient's dressing was dry.  Wound  was  benign.  Calf was soft and nontender.  Bowel sounds were 1+ in 3/4  quadrant and was soft and nontender.  Calf was also soft and nontender.  He was neurovascular intact.  He was otherwise making steady progress  with physical therapy, but was somewhat dizzy and weak when he would  rise so was typed, crossed, and transfused with 2 units of packed red  cells.  He continued physical therapy and we continued to await skilled  nursing bed.  I will dictate an addendum regarding his final day in the  hospital.   DISCHARGE MEDICATIONS:  1. Simvastatin 20 mg p.o. nightly.  2. Protonix 40 mg p.o. daily a.c.  3. Ditropan 5 mg one p.o. t.i.d.  4. Coumadin per pharmacy protocol for chronic Coumadin prophylaxis,      Lovenox until it becomes therapeutic at 30 mg q.12 h. subcu.  5. Colace 100 mg b.i.d.  6. Senokot one p.o. b.i.d. with meals.  7. Neurontin 600 mg p.o. t.i.d.  8. Milk of magnesia as needed.  9. Percocet 5/325 1-2 p.o. q.4 h. p.r.n. pain.  10.Phenergan 12.5 mg IV q.6 h. or 25 mg p.o. q.6 h. as needed for      nausea.  11.Robaxin 500 mg p.o. q.6 h. p.r.n. spasm.   ACTIVITY:  Weightbearing as tolerated with total knee precautions using  a walker.  Dressing changes daily or as needed.  Diet is regular.  He  should return to see Dr. Thurston Hole in 2 week's time.  Please call his  office for an appointment.   FINAL DIAGNOSIS:  End-stage degenerative joint disease of the left knee.   PROCEDURE WHILE IN HOSPITAL:  Left total knee arthroplasty.     Laural Benes. Jannet Mantis.       Robert A. Thurston Hole, M.D.  Electronically Signed   JBR/MEDQ  D:  02/19/2008  T:  02/20/2008  Job:  [id number]

## 2010-12-27 NOTE — Op Note (Signed)
NAMEPRATYUSH, AMMON                     ACCOUNT NO.:  [id number]   MEDICAL RECORD NO.:  [id number]          PATIENT TYPE:  INP   LOCATION:  5038                         FACILITY:  [facility]   PHYSICIAN:  Robert A. Thurston Hole, M.D. DATE OF BIRTH:  [birth year]   DATE OF PROCEDURE:  02/17/2008  DATE OF DISCHARGE:                               OPERATIVE REPORT   PREOPERATIVE DIAGNOSIS:  Left knee degenerative joint disease.   POSTOPERATIVE DIAGNOSIS:  Left knee degenerative joint disease.   PROCEDURE:  Left total knee replacement using DePuy cemented total knee  system with #4 cemented femur, #5 cemented tibia with 12.5-mm  polyethylene RP tibial spacer and 38-mm polyethylene cemented patella.   SURGEON:  Elana Alm. Thurston Hole, MD   ASSISTANT:  Julien Girt, PA   ANESTHESIA:  General.   OPERATIVE TIME:  1 hour and 30 minutes.   COMPLICATIONS:  None.   DESCRIPTION OF PROCEDURE:  Mr. Canoy was brought to the operating room on  February 17, 2008, after a femoral nerve block was placed by anesthesia.  He  was placed on the operative table in the supine position.  He received  vancomycin 1 gram IV preoperatively for prophylaxis.  After being placed  under general anesthesia, he had a Foley catheter placed under sterile  conditions.  His left knee was examined.  Range of motion from -8 to 125  degrees, a mild varus deformity, knee stable, and ligamentous exam with  normal patellar tracking.  Left leg was prepped using sterile DuraPrep  and draped using sterile technique.  Leg was exsanguinated and a thigh  tourniquet elevated to 365 mm.  In addition to the 15 cm longitudinal  incision based over the patella, initial exposure was made.  The  underlying subcutaneous tissues were incised along with skin incision.  A median arthrotomy was performed revealing an excessive amount of  normal-appearing joint fluid.  The articular surfaces were inspected.  He had grade 4 changes medially, grade 3 and 4 changes  laterally, and  grade 3 and 4 changes in the patellofemoral joint.  Osteophytes were  removed from the femoral condyles and tibial plateau.  The medial and  lateral meniscal remnants were removed as well as the anterior cruciate  ligament.  An intramedullary drill was then drilled up the femoral canal  for placement of distal femoral cutting jig, which was placed in the  appropriate manner of rotation, and a distal 11 mm cut was made in the  appropriate manner by external rotation.  The distal femur was incised.  The #4 was found to be the appropriate size.  A #4 cutting jig was  placed in the appropriate manner by external rotation and then these  cuts were made.  At this point, the proximal tibia was exposed.  The  tibial spines were removed with an oscillating saw.  Intramedullary  drill was drilled down the tibial canal for placement of proximal tibial  cutting jig, which was placed in the appropriate manner rotation and a  proximal 6-mm cut was made based off the medial or  lower side.  Spacer  blocks were then placed in flexion and extension.  The 12.5 mm blocks  gave excellent balancing, excellent stability, and excellent correction  of his flexion and varus deformities.  The proximal tibia was then  exposed.  The #5 tibial base plate trial was placed on the cut tibial  surface with an excellent fit, and the keel cut was made.  The PCL box  cutter was then placed on the distal femur and then these cuts were  made.  At this point, a #4 femoral trial was placed with the #5 tibial  base plate trial and a 12.5-mm polyethylene RP tibial spacer.  The knee  was reduced, taken through a full range of motion, found to be stable  and excellent correction of his flexion and varus deformities with  normal patellar tracking.  A resurfacing 9 mm cut was made and then 3  locking holes placed for a 38-mm patella.  The patellar trial was placed  and again patellofemoral tracking was evaluated and  found to be normal.  At this point, it was felt that all the trial components were of  excellent size, fit, and stability.  They were then removed.  The knee  was then jet lavage irrigated with 3 liters of saline.  The proximal  tibia was then exposed and a #5 tibial baseplate with cement backing was  hammered into position with an excellent fit with excess cement being  removed from around the edges.  The #4 femoral component with cement  backing was hammered into position also with an excellent fit with  excess cement being removed from around the edges.  The 12.5-mm  polyethylene RP tibial spacer was placed on tibial baseplate.  The knee  reduced, taken through a full range of motion, found to be stable.  Leg  lengths are equal with excellent correction of his flexion and varus  deformities.  The 38-mm polyethylene cement backed patella was then  placed in its position and held there with a clamp.  After the cement  hardened, again patellofemoral tracking was evaluated and found to be  normal.  At this point, it was felt that all the regular components were  of excellent size, fit, and stability.  The wound was further irrigated  with saline.  The tourniquet was then released.  Hemostasis obtained  with the cautery.  The arthrotomy was then closed with #1 Ethilon suture  over two medium Hemovac drains.  Subcutaneous tissues closed with 0 and  2-0 Vicryl, subcuticular layer closed with 4-0 Monocryl.  Sterile  dressings and a long-leg splint applied.  The patient then awakened,  extubated, and taken to the recovery room in stable condition.  Needle  and sponge counts were correct x2 at the end of the case.  Neurovascular  status normal as well.      Robert A. Thurston Hole, M.D.  Electronically Signed     RAW/MEDQ  D:  02/17/2008  T:  02/17/2008  Job:  [id number]

## 2010-12-27 NOTE — H&P (Signed)
NAMECHAOS, CARLILE NO.:  [id number]   MEDICAL RECORD NO.:  [id number]          PATIENT TYPE:  INP   LOCATION:  2907                         FACILITY:  [facility]   PHYSICIAN:  Verne Carrow, MDDATE OF BIRTH:  [birth year]   DATE OF ADMISSION:  12/18/2008  DATE OF DISCHARGE:                              HISTORY & PHYSICAL   PRIMARY CARDIOLOGIST:  Maisie Fus C. Wall, MD, [facility]   PRIMARY CARE Edgardo Petrenko:  Cat Ta, MD   PATIENT PROFILE:  A 43 year old Caucasian male with prior history of  CAD, status post previous anterior MI and recent non-ST elevation MI in  July 2009 who presents with acute onset of dyspnea and ECG changes  concerning for ST elevation MI.   PROBLEM LIST:  1. CAD.  1A.  Status post anterior MI in 1979 (there are multiple dates listed in  various histories as to when this actually occurred).  1B.  Status post cardiac catheterization in 1983 showing occluded LAD.  The patient was treated medically.  1C.  On July 2009, non-ST elevation MI in perioperative period.  Following knee replacement.  Catheterization at that time revealed left  main: 30%.  LAD:  100% proximal.  D1 20% to 30%.  Left circumflex  nonobstructive.  RCA 60% proximal and calcified.  A 30% to 40% mid.  PL  30% to 40%.  1. Peripheral vascular disease.  2. Cerebrovascular disease.  3. History of TIA.  4. Hypertension.  5. Hyperlipidemia.  6. Gout.  7. Ischemic cardiomyopathy with previously documented of EF 45% July      2009.  8. Peripheral neuropathy.  9. Iron deficiency anemia.  10.History of prostate CA.   HISTORY OF PRESENT ILLNESS:  A 77 year old Caucasian male with a history  of CAD, status post anterior MI in 1979 with known chronic total  occlusion of the LAD.  The last catheterization was performed in the  setting of a perioperative non-ST elevation MI in July 2009 revealing  total LAD with left-to-left minimal, right-to-left collaterals and was  found to be  nonobstructive RCA disease.  The patient was in his usual  state of health until this morning when he woke acutely dyspneic without  chest pain.  He subsequently called 911 and upon EMS arrival an ECG was  performed showing 0.5 to 1 mm J-point elevation in inferior leads as  well as V1 through V4.  Code STEMI was activated and the patient was  taken to the [facility] for emergent catheterization.   ALLERGIES:  PENICILLIN, SULFA, and DILAUDID.   HOME MEDICATIONS:  1. Zocor 20 mg daily.  2. Protonix 40 mg daily.  3. Ditropan 5 mg t.i.d.  4. Colace b.i.d.  5. Neurontin 600 mg t.i.d.  6. Percocet p.r.n.  7. Phenergan.  8. Robaxin.  9. Milk of magnesia p.r.n.  10.Lisinopril 2.5 mg daily.   FAMILY HISTORY:  (Obtained from previous records secondary to acuity).  Mother died at 69 from bone CA.  Father died at 63 with CVA and CAD.  He  has 2 of  8 siblings with CAD.   SOCIAL HISTORY:  Obtained from previous records.  He lives in  [place] by himself.  He is retired.  He denies tobacco, alcohol, or  drug use.  He is not routinely exercising.   REVIEW OF SYSTEMS:  Positive for progressive dyspnea over the past  couple of days culminating in an acute dyspnea this morning.  Otherwise,  all systems reviewed and negative.  He is a full code.   PHYSICAL EXAMINATION:  VITAL SIGNS:  The patient is afebrile, heart rate  93, respirations 14, blood pressure 130/79, and pulse ox 100% via face  mask.  GENERAL:  On arrival, the patient was acutely dyspneic.  He is awake,  alert, and oriented x3.  Normal affect.  HEENT:  Normal.  NEURO:  Grossly intact and nonfocal.  SKIN:  Warm and dry without lesions or masses.  MUSCULOSKELETAL:  Grossly normal without deformity or effusion.  NECK:  Supple with moderately elevated JVP.  LUNGS:  Respirations are unlabored.  No crackles bilaterally.  CARDIAC:  Regular S1 and S2.  No S3, S4, or murmurs.  ABDOMEN:  Round, soft, nontender, and  nondistended.  Bowel sounds  present x4.  EXTREMITIES:  Warm, dry, and pink.  No clubbing or cyanosis.  Trace  bilateral lower extremity edema.  Distal pulses are intact.   Chest x-ray is pending.  EKG shows sinus rhythm rate of 100.  A 0.5 to 1  mm J-point elevation II, III, aVF, and V1 through V5.  Lab work is  pending.   ASSESSMENT AND PLAN:  1. Possible acute coronary syndrome.  ECG is not convincing; however,      the patient is acutely ill and dyspneic.  He is volume overloaded      has been treated with Lasix during the lab.  Emergent      catheterization is ongoing add aspirin and statin.  2. Acute systolic/diastolic congestive heart failure/ischemic      cardiomyopathy.  The patient is volume overloaded in exam.  He has      been treated with Lasix in the lab.  We will continue to treat him      in the ICU with IV Lasix.  Continue ACE inhibitor.  Avoid beta-      blocker at this point in the setting of acute failure.  BP control      has      improved since arriving in the lab.  3. Hypertensive urgency, diuresed.  Continue ACE inhibitor.  Pressures      improved.  4. Hyperlipidemia.  Check lipid LFTs.  Continue his home dose of      Zocor.      Nicolasa Ducking, ANP      Verne Carrow, MD  Electronically Signed    CB/MEDQ  D:  12/18/2008  T:  12/19/2008  Job:  [id number]

## 2010-12-27 NOTE — Consult Note (Signed)
NAMECAPTAIN, BLUCHER                     ACCOUNT NO.:  [id number]   MEDICAL RECORD NO.:  [id number]          PATIENT TYPE:  INP   LOCATION:  4733                         FACILITY:  [facility]   PHYSICIAN:  Cherylynn Ridges, M.D.    DATE OF BIRTH:  1923/09/18   DATE OF CONSULTATION:  DATE OF DISCHARGE:                                 CONSULTATION   Thank you for asking me to see [name], a very pleasant 77 year old  gentleman recently admitted by the orthopedic surgery service for a knee  replacement which was done by Dr. Thurston Hole February 17, 2008, followed by  postoperative cardiac event possibly myocardial ischemia.  It has been  characterized as a non-ST-elevation myocardial infarction though  troponin levels and CK-MB levels which did elevate troponin up to 1.48.  This happened about February 20, 2008.  He was being considered for rehab  admission.  He  was having a cardiac catheterization procedure before he was to be  discharged to rehab, but yesterday started having large amount of nausea  and vomiting, had to be treated with Phenergan.  He still threw up about  500 mL of foul-smelling bilious emesis.  Subsequent NG tube placement  drained approximately 1200 mL.  Three-way abdominal film demonstrated  some dilated small-bowel loops along with some gas in the colon but no  clear distention of the large bowel.  CT scan has not been done of the  abdomen.   PAST MEDICAL HISTORY:  1. Significant for from what he tells me thalassemia with chronic      anemia followed by the Heme/Onc service over at [facility].  2. Has a significant history of coronary artery disease status post MI      in 1979 and recently non-ST segment elevation MI on this admission.  3. He has a history of gout.  4. Hyperlipidemia.  5. Prostate cancer.  6. Peripheral neuropathy.  7. TIA.  8. Peripheral vascular disease.  9. Cerebrovascular disease.  10.He also has a history of chronic anemia.   PAST SURGICAL HISTORY:  He has  had a open cholecystectomy many years ago  and an open appendectomy many years ago also.  He has also had a TURP  and surgery for prostate cancer.  He has had a left carotid  endarterectomy and right knee surgery.   REVIEW OF SYSTEMS:  The patient has not passed any gas or had a bowel  movement for a week since his weekend in the hospital.  This is all  postoperatively.  He has had no cough, no recent chest pain.   PHYSICAL EXAMINATION:  On exam, he is afebrile.  Other vital signs are  stable.  I focused on his abdomen which is distended, nontender.  He has  well-healed right upper quadrant and right lower quadrant scars from his  previous surgeries.  He has very few, if any, bowel sounds.  Rectal  exam was not performed.   LABORATORY STUDIES:  Laboratory studies today demonstrate a hemoglobin  of 9.9.  His white count is 10.800, platelets  of 271,000.  He does not  have a left shift.  Electrolytes all within normal limits with a BUN of  17, creatinine of 1.15.  PT/INR, the patient has been on Coumadin, is  17.3 with an INR if 1.4.   IMPRESSION:  My impression is that this well could be a bowel  obstruction secondary to scar tissue and adhesions.  However, with his  history of chronic anemia and no recent endoscopy or colonoscopy, this  may be helpful in the further evaluation of the patient's problem.  We  will repeat a 3-way abdominal film tomorrow morning to see if his bowel  obstruction has improved.  However, with the insertion of an NG tube, he  has had significant improvement in his symptoms.  He has drained a lot  and therefore this may be a bowel obstruction that does not resolve with  a recent MI, although his non-ST segment elevation MI, his rest of  perioperative morbidity is significant from a cardiac standpoint.  Surgery will not be done very quickly.  We may have to hold his  Coumadin.  We understand this may not be ideal, if so, we will put him  on heparin to bridge  that gap, but currently I will get the GI service  involved, Dr. Matthias Hughs, to rule out any other possibility.  I will give  him an enema just in case this is a significant impaction or obstipation  secondary to stool in the colon.      Cherylynn Ridges, M.D.  Electronically Signed     JOW/MEDQ  D:  02/26/2008  T:  02/26/2008  Job:  [id number]

## 2010-12-30 NOTE — Consult Note (Signed)
[name], [name]                     ACCOUNT NO.:  [id number]   MEDICAL RECORD NO.:  [id number]          PATIENT TYPE:  INP   LOCATION:  6706                         FACILITY:  [facility]   PHYSICIAN:  James L. Malon Kindle., M.D.DATE OF BIRTH:  1924/01/10   DATE OF CONSULTATION:  10/09/2005  DATE OF DISCHARGE:                                   CONSULTATION   REQUESTING PHYSICIAN:  Dr. Sherin Quarry.   PRIMARY CARE PHYSICIAN:  Dr. Foye Deer.   HISTORY:  A very nice 77 year old patient who has seen my partner, Dr.  Molly Maduro Buccini, in the past.  He has had previous colonoscopies.  A couple  of years ago he had to be briefly admitted overnight for observation for  pain following a colonoscopy, did well and had no further problems, and has  not seen Dr. Matthias Hughs back since then.  For 5 days he has been ill with an  illness described as watery diarrhea, recurrent vomiting, weakness and  malaise.  He may have had some temperature and did have chills.  He had  profuse watery diarrhea and lost 10 pounds in approximately 2-3 days and was  found on exam in the physician's office to be profoundly dehydrated and was  admitted to the hospital.  He has had cramping abdominal pain and still  having some pain.  He has had loose stools and nausea and vomiting.  We are  asked to see him due to the fact that his symptoms are still continuing.  The patient notes that since he has been here over the past couple of days,  his diarrhea has resolved.  His last bowel movement was yesterday.  He is  still having some cramping, but ate a regular diet today.  His labs have  been remarkable for a normal white count and a slight drop in potassium to  2.8 today; it has been running a little bit low.  Albumin has dropped to  2.5.  The patient has received a large amount of fluids.  Stool cultures  were negative and there were no white cells seen in the stool.  The patient  had a CT scan of the abdomen due to his  complaints of pain and shows a small  amount of pelvic ascites and a suprapubic catheter.  The bowel looked okay.  There was an ileus versus early partial small-bowel obstruction.  This was  done 2 days ago.  The patient notes that he is still having stools and  passing gas.  Last stool yesterday revealed no diarrhea.  He had several  flatulent episodes today.   MEDICATIONS ON ADMISSION:  Coumadin, Neurontin, Flomax, hydrochlorothiazide  and Lovastatin.   ALLERGIES:  He is allergic to PENICILLIN, SULFA and DILAUDID.   MEDICAL HISTORY:  1.  Coronary artery disease.  The patient has had previous MI and is on      chronic Coumadin.  He has an ejection fraction of 36%.  2.  He has had a history of prostate cancer.  He has had seed implantation  for prostate cancer is followed by Dr. Logan Bores.  3.  History of suprapubic catheter.  4.  Status post appendectomy and cholecystectomy.  5.  He has also had a non-abdominal surgery, carotid endarterectomies and      back surgery.  6.  He has had colonoscopy by Dr. Matthias Hughs a couple of years ago that was      negative other than a few polyps.   FAMILY HISTORY:  Family history remarkable for heart attacks and strokes.   PHYSICAL EXAMINATION:  VITAL SIGNS:  The patient is afebrile.  Vital signs  are normal.  GENERAL:  A pleasant, talkative white male.  EYES:  Sclerae anicteric.  LUNGS:  Clear.  HEART:  Regular rate and rhythm without murmurs or gallops.  ABDOMEN:  Slightly distended with excellent bowel sounds with mild non-  localizing tenderness.   ASSESSMENT:  Nausea, vomiting and diarrhea.  This more than likely is due to  a viral gastroenteritis; however, the patient certainly could have a partial  small-bowel obstruction due to adhesions.  He has had previous abdominal and  pelvic surgery.  I think that his symptoms, however, seem to be more  consistent with a viral gastroenteritis.   PLAN:  We will feed the patient, get an flat and  upright abdominal series  tomorrow to evaluate for signs of obstruction.           ______________________________  Llana Aliment Malon Kindle., M.D.     Waldron Session  D:  10/09/2005  T:  10/10/2005  Job:  [id number]   cc:   Dellis Anes. Idell Pickles, M.D.  Fax: [phone]

## 2010-12-30 NOTE — Letter (Signed)
May 16, 2006     Robert A. Thurston Hole, M.D.  [address]  [address]  [place], [place] [address]   RE:  [name], [name]  MRN:  [id number]  /  DOB:  06-07-1924   Dear Reita Cliche,   Our mutual patient [name] underwent nuclear stress test recently; this  revealed an EF of 42%.  There was evidence of prior anteroapical and septal  infarction, but no ischemia.  Because of his generalized vascular disease  and age 77, he is certainly at some increased risk for surgery; however, I  think this is reasonably low.   In addition, discontinuing his Coumadin also puts him at some increased  risk.   If there is further information desired, please do not hesitate to notify  me.    Sincerely,     E. Graceann Congress, MD, [facility]   EJL/MedQ  DD:  05/16/2006  DT:  05/18/2006  Job #:  [id number]

## 2010-12-30 NOTE — Letter (Signed)
May 02, 2006     Robert A. Thurston Hole, M.D.  [address] [place], [place] [address]   RE:  Luke, Garner  MRN:  [id number]  /  DOB:  [birth year]   Dear [name]:   Our mutual patient, [name], was seen on May 02, 2006 for a  preoperative evaluation.  He is noted to be a pleasant 77 year old white  married male with a long history of coronary artery disease, LV dysfunction,  recent EF of 40-45% by echo.  He has a history of an anterior wall  myocardial infarction 25 years ago, at which time he had a total LAD, normal  circumflex and normal RCA.  He also had a history of right carotid  endarterectomy by Dr. Arbie Cookey because of symptoms of TIA.   In addition, he has hypertension.  He has been on Coumadin for many years,  the patient states, starting at the time he had his heart attack.   Recently, he has had problems with his right knee, and I believe  arthroscopic surgery is planned.   He is having no cardiac symptoms and generally feels quite well.   PHYSICAL EXAMINATION:  VITAL SIGNS:  Blood pressure of 122/50, pulse 75,  normal sinus rhythm.  GENERAL APPEARANCE:  Normal.  NECK:  JVP is elevated.  Carotid pulses palpable without bruits.  LUNGS:  Clear.  CARDIAC:  Normal.  ABDOMEN:  Normal.  EXTREMITIES:  Normal.   For a preoperative evaluation, I have suggested an adenosine Myoview.  In  addition, he has not had an abdominal ultrasound in 10 years, and I think  because of his generalized vascular disease this would be important to  repeat.   Following the stress test, we will make a decision concerning clearance for  surgery.    Sincerely,      E. Graceann Congress, MD, [facility]   EJL/MedQ  DD:  05/02/2006  DT:  05/03/2006  Job #:  [id number]   CC:    Chales Salmon. Abigail Miyamoto, M.D.

## 2010-12-30 NOTE — Letter (Signed)
April 11, 2006     Chales Salmon. Abigail Miyamoto, M.D.  [address], [address]  [place], [place]  [address]   RE:  TRIPP, GOINS  MRN:  [id number]  /  DOB:  08-30-1923   Dear [name]:   It was a pleasure seeing this patient, [name], for follow-up on April 11, 2006.  As you know, he is a very pleasant 77 year old white married male  with a long history of coronary artery disease, LV dysfunction, with an EF  of 36% by Cardiolite in September 2005; however, with an EF of 40-45% by 2-D  echo in September 2006.  His initial episode of coronary artery disease with  anterior wall myocardial infarction was 25 years ago, at which time he had a  total LAD, normal circ, and normal RCA.  The patient had a right carotid  endarterectomy by Dr. Arbie Cookey, because of symptoms of TIA.  In addition, he  has hypertension.   He is on Coumadin, baby aspirin 81, Lovastatin 40, HCTZ 12.5, Neurontin,  felodipine recently started at [place] at 5 mg daily.   The patient states that he is having no cardiac symptoms.  He has had skin  cancer removed from his left ear and his left arm.  He does not think it was  a melanoma.   He has had some problems with his right knee.  He is to get an MRI tomorrow.   Physical Exam:  Blood pressure was 140/68, pulse 84, normal sinus rhythm.  General appearance:  Normal.  JVP is not elevated.  Carotid pulses palpable  without bruit.  Lungs clear.  Cardiac exam is normal.  Extremities  unremarkable except for right knee brace.   Because of the coronary disease, LV dysfunction, and mild hypertension, I  suggest adding Coreg CR 10.  We will plan to see him back in 3-4 weeks and  get a 2-D echo at that time.  His EKG reveals normal sinus rhythm at 86 and  old anteroseptal MI.  Thank you for the opportunity of seeing this nice  gentleman.    Sincerely,      E. Graceann Congress, MD, [facility]   EJL/MedQ  DD:  04/11/2006  DT:  04/11/2006  Job #:  [id number]

## 2010-12-30 NOTE — Op Note (Signed)
[name], [name]                     ACCOUNT NO.:  [id number]   MEDICAL RECORD NO.:  [id number]          PATIENT TYPE:  AMB   LOCATION:  [facility]                         FACILITY:  [facility]   PHYSICIAN:  Jamison Neighbor, M.D.  DATE OF BIRTH:  1924-04-10   DATE OF PROCEDURE:  05/03/2004  DATE OF DISCHARGE:                                 OPERATIVE REPORT   PREOPERATIVE DIAGNOSIS:  Carcinoma of the prostate.   POSTOPERATIVE DIAGNOSIS:  Carcinoma of the prostate.   PROCEDURE:  Cystoscopy and I-125 seed implantation.   SURGEON:  Jamison Neighbor, M.D.   RADIATION ONCOLOGIST:  Wynn Banker, M.D.   ANESTHESIA:  General.   COMPLICATIONS:  None.   DRAINS:  16 French Foley catheter.   BRIEF HISTORY:  This 77 year old male had a rise in his PSA.  The patient  was found to have carcinoma of the prostate on prostate ultrasound guided  biopsy.  The patient had previously undergone a TURP.  He also has  previously undergone a penile prosthesis insertion.  There are clearly  issues that are associated with those two conditions that would make the  implant technically difficult, but after careful consideration, Dr.  Dan Humphreys felt that this would be technically feasible without any risks of  injury to the implant, itself, or to the urethra.  The patient understands  the risks and benefits of the procedure.  He has been fully appraised of his  other options including the possibility of watchful waiting, hormonal  therapy, cryosurgery, external beam radiation therapy, or surgery.  The  patient gave full informed consent for seed implantation.   DESCRIPTION OF PROCEDURE:  After successful induction of general anesthesia,  the patient was placed in the dorsal lithotomy position, prepped with  Betadine, and draped in the usual sterile fashion.  The step device was  inserted to guide the ultrasound probe.  The patient was then positioned so  that the images obtained were identical to those seen on  the preplanning  session.  The Burdette system was used to make some modifications to the  plan in order to insure accurate placement of all needles.  It was clear  that the implant could be placed in such a way as to avoid the penile  prosthesis and the urethra.  The patient was then implanted with a total of  38 seeds delivered through a total of 13 needles.  The seeds were implanted  using both ultrasound and fluoroscopic control.  The patient's penile  implant was cycled at the end of the procedure and appeared to cycle  normally with no signs of any injury to the device.  The cystoscope was then  inserted and the bladder was carefully inspected.  It was free of any tumors  or stones.  Both ureteral orifices were normal in configuration and  location.  There was no seed material anywhere within the bladder.  The  urethra was wide open with no signs of any bladder neck contracture or  obstruction.  The urethra was visualized carefully and there was no  sign of  any injury to the urethra or the prosthesis  in that area, as well.  The patient had a Foley catheter inserted which will  be removed on postoperative day one.  His wife can do this as she is a  Engineer, civil (consulting).  The patient will be sent home with pain medication as well as  antibiotic coverage and will return to see me in follow up in 2-3 weeks.      RJE/MEDQ  D:  05/03/2004  T:  05/03/2004  Job:  [id number]   cc:   Wynn Banker, M.D.  [address] [facility]  [place]  [place] [address]  Fax: [phone]   Chales Salmon. Abigail Miyamoto, M.D.  [address]  [place]  [place] [address]  Fax: [phone]

## 2010-12-30 NOTE — Assessment & Plan Note (Signed)
[facility]                                 ON-CALL NOTE   NAME:LEERemer, Couse                            MRN:          [id number]  DATE:03/17/2008                            DOB:          1924/03/19    PRIMARY CARDIOLOGIST:  Jesse Sans. Wall, MD, [facility]   Mr. Candelas called in this evening because his blood pressure was noted to  be low, predominantly in the 90s to low 100s, but apparently it dipped  lower into the 80s this morning when a home health nurse came to  evaluate it.  He takes carvedilol 6.25 mg b.i.d. and he has been  asymptomatic with the exception of some lightheadedness upon standing.  He called in asking what to do.  He is due a dose of Coreg tonight.  I  have recommended that he hold tonight's dose of Coreg and then in the  a.m. he should check his blood pressure prior to any dose and if his  systolic blood pressure is less than 100, to hold that dose.  It is  likely we will have to reduce his dose further to 3.125 mg b.i.d.  The  patient was grateful for the callback.      Nicolasa Ducking, ANP  Electronically Signed      Jesse Sans. [name], MD, [facility]  Electronically Signed   CB/MedQ  DD: 03/17/2008  DT: 03/18/2008  Job #: [id number]

## 2010-12-30 NOTE — Discharge Summary (Signed)
[name], [name] NO.:  [id number]   MEDICAL RECORD NO.:  [id number]          PATIENT TYPE:  INP   LOCATION:  6706                         FACILITY:  [facility]   PHYSICIAN:  Sherin Quarry, MD      DATE OF BIRTH:  September 09, 1923   DATE OF ADMISSION:  10/06/2005  DATE OF DISCHARGE:  10/10/2005                                 DISCHARGE SUMMARY   [name] is a 77 year old man who was admitted to [facility] on  October 05, 2005.  On the previous Monday, he had begun to experience  illness characterized by watery diarrhea, recurrent vomiting, anorexia,  weakness and malaise.  Because symptoms failed to resolve and because of  progressive weakness, he presented to Dr. Jobe Gibbon office where he was noted  to have significant orthostatic hypotension as noted in the chart.  He was  therefore referred to [facility].   Physical exam at the time of admission showed a blood pressure of 147/72.  At that point, he had received some intravenous fluid.  His chest was clear.  Cardiovascular exam revealed normal S1-S2 without rubs, murmurs or gallops.  The abdomen was benign.  There were normal bowel sounds without masses or  tenderness.  No guarding or rebound.  Neurologic testing and examination of  the extremities was normal.   Relevant studies obtained included a CT scan of the abdomen which showed an  early small bowel obstruction localized to the region of the distal ileum.  This also could represent an ileus pattern.  Laboratory studies obtained  included a white count of 5300, hemoglobin of 10. INR of 2.2.  Initial  potassium was 2.8.  Liver profile was normal.   On admission, the patient was placed on a clear liquid diet as well as IV of  normal saline.  He was given 500 mL bolus and then another 500 mL over the  next two hours and 150 mL/hour with 10 mEq of KCl.  Other medications were  continued.   By October 08, 2005, the patient was feeling better and  was advanced to a  bland soft diet. When he was initially admitted, presumably because of  nausea and vomiting, his INR was subtherapeutic and he therefore required a  loading dose of Coumadin.  Prior to being discharged, the patient requested  that he be seen by Dr. Matthias Hughs and his partners.  This was arranged and the  patient was ultimately seen by Dr. Randa Evens.  Dr. Randa Evens felt the patient  likely had a viral gastroenteritis.  He thought it was reasonable to go  ahead and feed him and see how he did.  He did not think any further workup  was indicated.  By October 10, 2005, the patient was tolerating oral feeds  well.  A repeat KUB showed a resolving ileus.  Therefore, it was felt  reasonable to discharge the patient.  At the time of discharge, he was  advised to continue his preadmission medications which consisted of  Neurontin 1200 mg daily, lovastatin 10 mg daily,  hydrochlorothiazide 12.5 mg  daily.  He was advised to change Coumadin to 4 mg daily except for 5 mg on  Monday and Wednesday.  He was also advised to take potassium 20 mEq daily.   DISCHARGE DIAGNOSES:  1.  Probable viral gastroenteritis with associated ileus.  2.  Coronary artery disease.  3.  History of carotid endarterectomy.  4.  Hyperlipidemia.  5.  History of prostate cancer.  6.  Status post cholecystectomy.  7.  History of back operation.  8.  History of colon polyps.   The patient was advised to return to see Dr. Abigail Miyamoto in one week.  At that  time, he should have his potassium checked.  He was also advised to follow  up with Dr. Matthias Hughs on a regular basis as he usually does. Condition was  fair.           ______________________________  Sherin Quarry, MD     SY/MEDQ  D:  11/07/2005  T:  11/08/2005  Job:  [id number]   cc:   Dellis Anes. Idell Pickles, M.D.  Fax: [phone]

## 2010-12-30 NOTE — Procedures (Signed)
NAME:  [name], [name]                     ACCOUNT NO.:  [id number]   MEDICAL RECORD NO.:  [id number]          PATIENT TYPE:  OUT   LOCATION:  SLEEP CENTER                 FACILITY:  [facility]   PHYSICIAN:  Clinton D. Maple Hudson, M.D. DATE OF BIRTH:  [birth year]   DATE OF STUDY:  11/28/2004                              NOCTURNAL POLYSOMNOGRAM   STUDY DATE:  November 28, 2004   REFERRING PHYSICIAN:  Dr. Henrine Screws   INDICATION FOR STUDY:  Hypersomnia with sleep apnea.  Epworth Sleepiness  Score 8/24, BMI 25, weight 185 pounds.   SLEEP ARCHITECTURE:  Total sleep time 335 minutes with sleep efficiency 70%.  Stage I was 9%, stage II 48%, stages III and IV 21%, REM was 21% of total  sleep time.  Latency to sleep onset 15 minute, latency to REM 114 minutes,  awake after sleep onset 126 minutes, arousal index 11.   RESPIRATORY DATA:  Split-study protocol.  Respiratory disturbance index  (RDI, AHI) 39.2 obstructive events per hour indicating moderately severe  obstructive sleep apnea/hypopnea syndrome before CPAP.  This included 47  obstructive apneas and 40 hypopneas before CPAP.  Events were not  positional.  REM RDI 27.  CPAP was titrated to 7 CWP, RDI 7.8 per hour.  A  medium full face Ultra Mirage Mask was used.  Patient had some difficulty  adjusting to CPAP.   OXYGEN DATA:  Mild to moderate snoring with oxygen desaturation to a nadir  of 84% before CPAP.  After CPAP control saturation held 94-96% on room air.   CARDIAC DATA:  Normal sinus rhythm with occasional PAC and PVC.   MOVEMENT/PARASOMNIA:  Occasional leg jerks with insignificant impact on  sleep.   IMPRESSION/RECOMMENDATION:  1.  Moderately severe obstructive sleep apnea/hypopnea syndrome, respiratory      disturbance index 39.2 per hour with moderate snoring and oxygen      desaturation to 84%.  2.  Continuous positive airway pressure titration to 7 CWP, respiratory      disturbance index 7.8 per hour.  Suggest trying 8 CWP for a  little      better control, hopefully to get respiratory disturbance index below 7.8      per hour.  A medium full face Ultra Mirage Mask was used with heated      humidifier.  Patient may benefit from a sleep      medication during the initial week or two of adjustment to home      continuous positive airway pressure use.  Otherwise consider alternative      therapies as appropriate.      CDY/MEDQ  D:  12/04/2004 10:49:28  T:  12/04/2004 11:41:56  Job:  [id number]

## 2010-12-30 NOTE — Discharge Summary (Signed)
NAMEQUALYN, OYERVIDES                               ACCOUNT NO.:  [id number]   MEDICAL RECORD NO.:  [id number]                   PATIENT TYPE:  INP   LOCATION:  0278                                 FACILITY:  [facility]   PHYSICIAN:  Bernette Redbird, M.D.                DATE OF BIRTH:  1923/08/18   DATE OF ADMISSION:  12/22/2003  DATE OF DISCHARGE:  12/24/2003                                 DISCHARGE SUMMARY   FINAL DIAGNOSES:  1. Postcolonoscopy abdominal pain.  2. Colon polyps.  3. History of coronary disease.  4. History of prostatism.  5. History of thalassemia.  6. Chronic anticoagulation.   CONSULTATIONS:  Dr. Lebron Conners, general surgery.   PROCEDURE:  CT scan of abdomen and pelvis.   COMPLICATIONS:  None.   LABORATORY DATA:  Admission white count 6400; discharge white count 6100.  Admission hemoglobin 12.6; discharge hemoglobin 11.3, platelets 182,000.  INR 2.1 on admission and 1.9 on discharge.  Chemistry panel pertinent for  fasting glucose of 123 and 130.  Liver chemistries pertinent for mild  elevation of total bilirubin 1.4 (repeat, 1.3) with other liver chemistries  entirely normal.   HOSPITAL COURSE:  The patient was admitted because of pain which began about  a day after he underwent colonoscopy with removal of some small polyps (no  cautery required), performed at [facility].  He really was okay following  the procedure, but the pain developed subsequently.  There were concerns  about possible mesenteric hematoma so when we learned of the pain, he was  directed to the hospital where a CT scan was obtained which, on initial  reading, was raising the question of patchy bowel wall thickening consistent  with possible ischemia.  In view of his pain and this radiographic finding,  he was admitted to the hospital for observation.  His pain got progressively  better, and a review of the CAT scan suggested that the initial reading of  radiographic abnormality may have  been an over-call, so discharge was felt  to be frankly appropriate, and he was sent home on no new medicines with  instructions to call in the event of increased symptoms and follow up with  me in the office to be arranged to monitor symptoms as needed.   Note that the source of the patient's postprocedural abdominal pain was  never identified.   CONDITION ON DISCHARGE:  Improved.                                               Bernette Redbird, M.D.    RB/MEDQ  D:  01/24/2004  T:  01/24/2004  Job:  [id number]   cc:   Chales Salmon. Abigail Miyamoto, M.D.  [address]  [place]  [place] [address]  Fax: [phone]   Lorre Munroe., M.D.  Fax: [phone]   Jamison Neighbor, M.D.  [address]  [place]  [place] [address]  Fax: [phone]

## 2010-12-30 NOTE — H&P (Signed)
[name], [name]                               ACCOUNT NO.:  [id number]   MEDICAL RECORD NO.:  [id number]                   PATIENT TYPE:  INP   LOCATION:  0278                                 FACILITY:  [facility]   PHYSICIAN:  Bernette Redbird, M.D.                DATE OF BIRTH:  Jan 07, 1924   DATE OF ADMISSION:  12/23/2003  DATE OF DISCHARGE:                                HISTORY & PHYSICAL   HISTORY OF PRESENT ILLNESS:  This 77 year old gentleman (whose birthday is  today) is being admitted to the hospital because of abdominal pains and an  abnormal radiographic appearance of the small intestine, two days following  colonoscopy.   On the day before yesterday, [name] underwent an uneventful colonoscopy by  me at [facility]. During this time, several small medium sized polyps were  cold-snared from the proximal colon and a couple of other polyps were cold  biopsied. There was no significant bleeding at the time nor any particular  difficulty encountered with the exam and he seemed fine through the  procedure.   However, after going home and most especially yesterday, the patient  developed rather diffuse midabdominal pain, coming in waves or paroxysms of  severe, gripping, and cramplike pain somewhat like labor pains. He did not  have any nausea or vomiting or fevers and he was able to eat during the day  but his appetite was not very good. Shortly after his procedure, he had had  a small, bloody bowel movement but his subsequent bowel movements, small in  size, was not bloody in character. He reported these symptoms to [place] about 5  p.m. yesterday and he was directed to the emergency room to help exclude any  source of significant post procedural complication. In the emergency room,  he had stable vital signs and essentially normal laboratory tests, but a CT  scan was obtained which showed several areas of definite small bowel wall  thickening in a non-confluent fashion but in the same  region of the small  intestine, with some slight small bowel dilatation suggesting the  possibility of partial small bowel obstruction. Amongst the differential  diagnoses for this radiographic appearance was some degree of small bowel  ischemia, for which the patient is at some risk in view of the fact that he  had calcifications in his aorta, a prior history of coronary and carotid  disease. Also, the patient states he had never had pain like this before and  this abdominal exam was relatively benign out of proportion to the pain. On  the other hand, the mesenteric vessels including the SMA appeared  radiographically patent on the CT scan. In any event, it was felt prudent to  bring the patient in for clinical monitoring while on bowel rest to look for  any evolution in the pain which might indicate progression toward worsened  small bowel  ischemia or infarction.   From the time that the patient was brought to the emergency room last night  to this morning (he was admitted to the hospital about 3 o'clock this  morning), he has become pain-free after one or perhaps two doses of  morphine. His vital signs remain stable and his laboratory tests overnight  are essentially unchanged.   He has been seen in consultation at my request by Dr. Marcy Panning of general  surgery who thinks that the small bowel inflammation is nonspecific and he  tends to doubt that this is an ischemic process but he shares my concern as  to whether the patient may have embolized some atheromatous plaque into some  branches of his small bowel vasculature.   PAST MEDICAL HISTORY:   ALLERGIES:  PENICILLIN, DILAUDID, and SULFA.   OUTPATIENT MEDICATIONS:  Coumadin 5 mg three times a week and 4 mg on the  other four days, Flonase 0.4 mg daily, vitamin C 500 mg daily, 81 mg aspirin  once daily, lovastatin daily, and Neurontin 100 mg daily.   OPERATIONS:  Include previous back surgery, an appendectomy, and an open   cholecystectomy and carotid surgery.   MEDICAL ILLNESSES:  Include a remote history of a heart attack, a history of  prostatism, and thalassemia. There is no known history of COPD, hypertension  or diabetes.   HABITS:  Nonsmoker, nondrinker.   FAMILY HISTORY:  Negative for colon polyps, colon cancer, gallstones, ulcers  or __________.   SOCIAL HISTORY:  The patient is married to my patient, [name] and is  retired.   REVIEW OF SYSTEMS:  Please see HPI. The patient awaits urologic evaluation  by Dr. Marcelyn Bruins, who has seen him in the past, because of a problem with  the right lobe of the prostate noted on recent office examination and also  during his colonoscopy. The histology of his recent colon polyps is unknown.  He is free of chest pain at this time and apparently only rarely has anginal  symptoms. No trouble breathing, no nausea, no joint pains or skin rashes. He  has a history of prostatism but on Flomax his urine flow is quite good.  He does have peripheral neuropathy involving his right foot for which he  takes Neurontin.   PHYSICAL EXAMINATION:  This is a well-developed and well preserved gentleman  in no evident distress.   VITAL SIGNS:  Weight 187.4 pounds, O2 saturation 100%, temperature 97.3,  blood pressure 147/74, pulse 74.  GENERAL:  He is anicteric and without significant pallor.  CHEST:  Clear to auscultation.  HEART:  Has a regular rhythm without gallop, rubs, murmurs, clicks or  arrhythmia.  ABDOMEN:  Nondistended but diffusely, mildly tender, without any discrete  mass effect and, unlike last night when I examined him in the emergency  room, no rebound tenderness. (Last night, he did not have a rigid abdomen  but he did have some degree of rebound discomfort). Bowel sounds are  present.  I do not hear any bruits. No obvious organomegaly.  RECTAL:  Exam not performed last night but recently showing the prominent right lobe of the prostate.   NEUROLOGIC:  The patient is alert, oriented, coherent, and appropriate  without obvious cranial nerves or focal motor deficits.   LABORATORY DATA:  There is one there that is essentially stable and include  a white count of 6400 (versus 8600 last night), hemoglobin of 11.3 (was 12.6  last night) with an MCV  of 63 presumably due to his thalassemia, normal  platelets. INR of 2.1. Unremarkable chemistry panel.   CT scan:  Please see above discussion. Radiographic abnormality of the small  bowel suggesting possible multifocal ischemic changes with associated low  grade obstructive findings.   IMPRESSION:  1. Diffuse abdominal pain occurring following colonoscopy. 2. Multifocal     small bowel mural edema of unclear etiology, presumably accounting for     #1. 3. Recent tendency toward constipation without major findings on     recent colonoscopy. Several small polyps removed with histology pending.     4. Thalassemia. 5. Peripheral neuropathy. 6. History of vascular disease     characterized by carotid surgery, remote history of coronary disease, and     aortic calcifications on CT scanning but without obvious radiographic     destruction of the mesenteric vessels. 7. Prostatism with prominent right     lobe of prostate, awaiting updated urologic evaluation.   PLAN:  Based on Dr. Cammie Sickle recommendations, we will keep the patient on  limited p.o. intake, monitor laboratory tests, and monitor symptoms with  further evaluation, to include possible repeat CT scanning, to depend on his  clinical evolution.                                               Bernette Redbird, M.D.    RB/MEDQ  D:  12/23/2003  T:  12/23/2003  Job:  [id number]   cc:   Chales Salmon. Abigail Miyamoto, M.D.  [address]  [place]  [place] [address]  Fax: [phone]   Jarrett Soho, M.D.   Jamison Neighbor, M.D.  [address]  [place]  [place] [address]  Fax: [phone]

## 2010-12-30 NOTE — Op Note (Signed)
[name]  Patient:    [name], [name] Visit Number: [id number] MRN: [id number]          Service Type: SUR Location: 3W [id number] [id number] Attending Physician:  Londell Moh Proc. Date: 04/19/01 Admit Date:  04/19/2001   CC:         Cecil Cranker, M.D. [facility]  Chales Salmon. Abigail Miyamoto, M.D.   Operative Report  PREOPERATIVE DIAGNOSIS:  Bladder neck contracture.  POSTOPERATIVE DIAGNOSIS:  Bladder neck contracture.  OPERATION:  Transurethral incision of bladder neck contracture using YAG laser.  SURGEON:  Jamison Neighbor, M.D.  ANESTHESIA:  General.  COMPLICATIONS:  None.  DRAINS:  None.  BRIEF HISTORY:  This 77 year old male is status post TURP many years ago.  The patients AUA symptom score dropped all the way down to the normal level, but then this increased back all the way up to 21.  The patient tried Flomax without much improvement.  He tried anticholinergic therapy without much improvement.  He underwent a cystoscopic examination which showed that he had a bladder neck contracture.  His flow rate was diminished down around the fifth percentile on the nomogram.  The bladder itself was otherwise unremarkable.  No tumor, stones, or other abnormalities detected.  The patient would like to have this bladder neck contracture fixed.  He is to undergo Yag laser fulguration of that area using a tripod-type cut to try and open that area up.  The patient does note that his situation is somewhat complicated by the fact that he has an inflatable penile prosthesis in place.  The patient gave full and informed consent for the procedure.  DESCRIPTION OF PROCEDURE:  After the successful induction of general anesthesia, the patient was placed in the dorsal lithotomy position and prepped with Betadine and draped in the usual sterile fashion.  Cystoscopy was performed; the urethra was visualized in its entirety and was found to be normal.  Beyond the verumontanum,  the patients prostate itself was wide open, but he had a bladder contracture.  The patient had a [employer] type cut with three cuts made deep through the scar using the Yag laser, nicely opening up the bladder neck.  The bladder itself was carefully inspected.  It was free of any tumors or stones.  The patient had a 85 French catheter placed. Because he has the penile prosthesis in place, we will try to get this out as quickly as possible.  The patient will have 23-hour observation.  The patient tolerated the procedure well and was taken to the recovery room in good condition. Attending Physician:  Londell Moh DD:  04/19/01 TD:  04/19/01 Job: [id number]

## 2010-12-30 NOTE — Discharge Summary (Signed)
NAMEKALUP, JAQUITH                     ACCOUNT NO.:  [id number]   MEDICAL RECORD NO.:  [id number]          PATIENT TYPE:  INP   LOCATION:  4733                         FACILITY:  [facility]   PHYSICIAN:  Thomas C. Wall, MD, FACCDATE OF BIRTH:  07-09-1924   DATE OF ADMISSION:  02/17/2008  DATE OF DISCHARGE:  02/21/2008                               DISCHARGE SUMMARY   ADDENDUM   On the date of discharge, the patient sustained an MI.  Dr. [name] was  consulted, and the patient was subsequently transferred to telemetry and  to Dr. Vern Claude service, so he was discharged off Orthopedic Service and  admitted to Cardiology, and that was on February 21, 2008.  On February 24, 2008, the patient did undergo a cardiac catheterization, and he  continued to have orthopedic care while he was in the hospital as well  as cardiac care.  He was discharged to a skilled nursing facility in  stable condition following his cardiac episode, cardiac catheterization,  and cardiac treatment.  He was discharged on February 28, 2008, to a skilled  nursing facility.  There should be an interval discharge summary from  February 21, 2008, to February 28, 2008, dictated by Cardiology.      Kirstin Shepperson, P.A.      Thomas C. [name], MD, [facility]  Electronically Signed    KS/MEDQ  D:  03/24/2008  T:  03/24/2008  Job:  [id number]

## 2010-12-30 NOTE — Op Note (Signed)
[name], [name]                     ACCOUNT NO.:  [id number]   MEDICAL RECORD NO.:  [id number]          PATIENT TYPE:  OIB   LOCATION:  4713                         FACILITY:  [facility]   PHYSICIAN:  Robert A. Thurston Hole, M.D. DATE OF BIRTH:  1923-11-07   DATE OF PROCEDURE:  06/11/2006  DATE OF DISCHARGE:                                 OPERATIVE REPORT   PREOPERATIVE DIAGNOSES:  1. Right knee medial and lateral meniscal tears with chondromalacia,      degenerative joint disease and synovitis.  2. Right knee gout.   POSTOPERATIVE DIAGNOSES:  1. Right knee medial and lateral meniscal tears with chondromalacia,      degenerative joint disease and synovitis.  2. Right knee gout.   PROCEDURES:  1. Right knee examination under anesthesia.  2. Arthroscopic partial medial and lateral meniscectomies.  3. Right knee chondroplasty with partial synovectomy.   SURGEON:  Elana Alm. Thurston Hole, M.D.   ASSISTANT:  Julien Girt, P.A.   ANESTHESIA:  Local/MAC.   OPERATIVE TIME:  30 minutes.   COMPLICATIONS:  None.   INDICATIONS FOR PROCEDURE:  [name] is an 77 year old gentleman, who has had  significant right knee pain for the past 8 months, increasing in nature with  exam and MRI documenting meniscal tearing with chondromalacia and synovitis.  He has failed conservative care and is now to undergo arthroscopy.   DESCRIPTION:  [name] was brought to the operating room on 06/11/06, after a  knee block was placed in the holding area by anesthesia.  He was placed on  the operating table in supine position.  He received vancomycin 1 g IV  preoperatively for prophylaxis.  His right knee was examined.  Range of  motion was from 0 to 125 degrees with 1 to 2+ coaptation.  Knee stable to  ligamentous exam, with normal patellar tracking.  The right leg was prepped  using sterile DuraPrep and draped using sterile technique.  Originally,  through an anterolateral portal, the arthroscope with a pump  attached was  placed, and through an anteromedial portal, an arthroscopic probe was  placed.  On initial inspection of the medial compartment, the articular  cartilage showed grade 3 chondromalacia over 50 to 70%, with gouty deposits,  which were  debrided.  Medial meniscal tear, posterior medial horn, of which  50% was resected back to a stable rim.  This also had significant gouty  deposits, and this was debrided.  The intercondylar notch was inspected.  The anterior and posterior cruciate ligaments were normal.  The lateral  compartment was inspected with 25 to 30% grade 3 chondromalacia with gouty  deposits, and this was debrided.  Lateral meniscus tear 25 to 30%,  posterolateral corner, was resected back to a stable rim.  The  patellofemoral joint showed 30 to 40% grade 3 chondromalacia, which was  debrided.  The patella tracked normally.  Moderate synovitis in the medial  and lateral gutters was debrided; otherwise, they were free of pathology.  After this was done, it was felt that all pathology had been satisfactorily  addressed.  The instruments were removed.  The portals were closed with 3-0  nylon suture and injected with 0.25% Marcaine with epinephrine and 4 mg of  morphine.  A sterile dressing was applied and the patient awakened and taken  to the recovery room in stable condition.   FOLLOWUP CARE:  Mr. Holt will be followed overnight for observation due to  his significant cardiac disease.  He will be discharged tomorrow, if stable.  See me back in the office in a week for sutures out and followup.      Robert A. Thurston Hole, M.D.  Electronically Signed     RAW/MEDQ  D:  06/11/2006  T:  06/11/2006  Job:  [id number]

## 2010-12-30 NOTE — H&P (Signed)
NAMETEDDIE, MEHTA NO.:  [id number]   MEDICAL RECORD NO.:  [id number]          PATIENT TYPE:  EMS   LOCATION:  ED                           FACILITY:  [facility]   PHYSICIAN:  Sherin Quarry, MD      DATE OF BIRTH:  September 24, 1923   DATE OF ADMISSION:  10/05/2005  DATE OF DISCHARGE:                                HISTORY & PHYSICAL   [name] is a 77 year old man who states that on Monday he began to  experience an illness characterized by watery diarrhea, recurrent vomiting,  anorexia, weakness and malaise. He feels that he may have had a low-grade  temperature and has had some mild chills. He became increasingly weak and  eventually presented Dr. Francis Dowse Heller's office today. He reported to Dr.  Idell Pickles that since the beginning of this illness he lost about 10 pounds. Dr.  Idell Pickles found that his blood pressure supine was 142/78, standing it was  120/60 with a rise in the heart rate of 90-120. Because of this finding and  the patient's apparent profound dehydration he was referred to [facility] for admission. There has been no associated headache, dizziness,  chest pain, shortness of breath, hematemesis, melena or hematochezia. He has  noticed decreased urine output.   PAST MEDICAL HISTORY:  He is allergic to:  1.  PENICILLIN.  2.  SULFA.  3.  DILAUDID.   His current medications are:  1.  Coumadin 5 mg two days a week and 4 mg five days a week.  2.  He also is taking Neurontin 1200 mg daily.  3.  He was on Flomax until recently but his urologist told to stop this.  4.  He is taking lovastatin at a dose of 10 mg daily.  5.  Hydrochlorothiazide 12.5 mg daily.   Dr. Jobe Gibbon notes indicate that he is also taking Altace 10 mg daily,  although the patient has no recollection of this.   Operations:  He has had a previous back operation, appendectomy, open  cholecystectomy, and carotid endarterectomy by Dr. Arbie Cookey. He has also had a  prostate seed implantation  done 2 years ago for prostate cancer and is  followed by Dr. Logan Bores for this problem.   Medical illnesses:  1.  Coronary disease. The patient is status post MI in 18. According to      his records, he underwent cardiac catheterization in 1983 which showed a      total occlusion of the left anterior descending. He had a Cardiolite      study done in September 2005 which showed an ejection fraction of 36%.      His ejection fraction measured on an echocardiogram done in September      2006 was said to be 40-45%.  2.  History of prostate cancer as described above.  3.  History of abdominal pain. The patient was hospitalized by Dr. Matthias Hughs      in May 2005 when he experienced abdominal pain after having a      colonoscopy. During  that colonoscopy apparently he had several colon      polyps removed. He was followed in the hospital but no significant      abnormalities were detected. It was thought that perhaps his symptoms      were on the basis of ischemia. There has been no recurrence of this      problem. This is a last time he had a colonoscopy.   FAMILY HISTORY:  Notable for heart attack and stroke in the patient's father  who died at the age of 15. His mother died of cancer at the age of 55.   SOCIAL HISTORY:  The patient is married. Of note is that according to the  family, his wife started having similar symptoms today.   REVIEW OF SYSTEMS:  HEAD:  He denies headache or dizziness. EYES:  He denies  visual blurring or diplopia. EAR, NOSE AND THROAT:  Denies earache, sinus  pain or sore throat. CHEST:  Denies coughing, wheezing or chest congestion.  CARDIOVASCULAR:  Denies orthopnea, PND or ankle edema. GI:  See above. GU:  See above. NEURO:  There is no history of seizure or stroke. ENDO:  Denies  excessive thirst, urinary frequency or nocturia.   PHYSICAL EXAMINATION:  VITAL SIGNS:  His blood pressure at present is  147/72, pulse is 90, respirations are 12 and unlabored.  HEENT:   Within normal limits.  CHEST:  Clear.  BACK:  Reveals no CVA or point tenderness.  CARDIOVASCULAR:  Reveals normal S1 and S2 without rubs, murmurs or gallops.  ABDOMEN:  Benign. There are normal bowel sounds. There are no masses or  tenderness, no guarding or rebound.  NEUROLOGIC TESTING AND EXAMINATION OF EXTREMITIES:  Normal. It is noteworthy  that his skin is quite dry and seems to tent easily.   IMPRESSION:  1.  Nausea and vomiting with dehydration, probably secondary to viral      gastroenteritis.  2.  Coronary disease with documented left ventricular dysfunction, status      post myocardial infarction.  3.  History of carotid endarterectomy.  4.  Hyperlipidemia.  5.  History of prostate cancer status post radium seed implant.  6.  Status post cholecystectomy.  7.  History of back operation.  8.  History of colon polyps.   The patient will be admitted at this time largely for intravenous fluid  administration. We will follow him closely in the hospital setting and  monitor his vital signs. Will continue his Coumadin therapy. Would  anticipate that this would be a self-limited illness.           ______________________________  Sherin Quarry, MD     SY/MEDQ  D:  10/05/2005  T:  10/05/2005  Job:  [id number]   cc:   Dellis Anes. Idell Pickles, M.D.  Fax: [phone]

## 2011-02-20 ENCOUNTER — Other Ambulatory Visit: Payer: Self-pay | Admitting: Cardiology

## 2011-03-10 ENCOUNTER — Other Ambulatory Visit: Payer: Self-pay | Admitting: Cardiology

## 2011-03-10 DIAGNOSIS — I6529 Occlusion and stenosis of unspecified carotid artery: Secondary | ICD-10-CM

## 2011-03-13 ENCOUNTER — Encounter (INDEPENDENT_AMBULATORY_CARE_PROVIDER_SITE_OTHER): Payer: Medicare Other | Admitting: *Deleted

## 2011-03-13 DIAGNOSIS — I6529 Occlusion and stenosis of unspecified carotid artery: Secondary | ICD-10-CM

## 2011-03-15 ENCOUNTER — Encounter: Payer: Self-pay | Admitting: Cardiology

## 2011-03-17 ENCOUNTER — Telehealth: Payer: Self-pay | Admitting: *Deleted

## 2011-03-17 NOTE — Telephone Encounter (Signed)
Message copied by Theda Belfast on Fri Mar 17, 2011  9:49 AM ------      Message from: Valera [place] C      Created: Wed Mar 15, 2011  1:55 PM       Stable. Repeat in one year not 6 months.

## 2011-03-17 NOTE — Telephone Encounter (Signed)
Pt aware of carotid doppler results with follow-up carotids in 1 yr. Mylo Red RN

## 2011-03-21 ENCOUNTER — Encounter: Payer: Self-pay | Admitting: Cardiology

## 2011-04-18 ENCOUNTER — Ambulatory Visit (INDEPENDENT_AMBULATORY_CARE_PROVIDER_SITE_OTHER): Payer: Medicare Other | Admitting: Cardiology

## 2011-04-18 ENCOUNTER — Encounter: Payer: Self-pay | Admitting: Cardiology

## 2011-04-18 VITALS — BP 108/50 | HR 72 | Ht 69.5 in | Wt 180.1 lb

## 2011-04-18 DIAGNOSIS — I6529 Occlusion and stenosis of unspecified carotid artery: Secondary | ICD-10-CM

## 2011-04-18 DIAGNOSIS — I10 Essential (primary) hypertension: Secondary | ICD-10-CM

## 2011-04-18 DIAGNOSIS — I2510 Atherosclerotic heart disease of native coronary artery without angina pectoris: Secondary | ICD-10-CM

## 2011-04-18 NOTE — Assessment & Plan Note (Signed)
Stable. Continue current medical therapy. 

## 2011-04-18 NOTE — Assessment & Plan Note (Signed)
Improved

## 2011-04-18 NOTE — Assessment & Plan Note (Signed)
Stable. Recheck carotid Dopplers next spring.

## 2011-04-18 NOTE — Progress Notes (Signed)
HPI [name] returns for evaluation and management coronary artery disease, history of myocardial infarction, labile hypertension, and carotid artery disease.  He denies any symptoms of TIAs or mini strokes. He's had a right carotid endarterectomy in the past. His last carotids were nonobstructive. There is no major ischemic symptoms. His blood pressure under much better control. Denies orthopnea, PND or edema. He has suffered from chronic cough evaluated by his primary care. Chest x-ray reviewed and showed normal cardiac size with some parabronchial thickening. He is being treated for reflux. He is on PPI for about a week.  EKG shows normal sinus rhythm with his old anteroseptal MI pattern, no change. Past Medical History  Diagnosis Date  . Coronary artery disease     native vessel  . Carotid artery stenosis     without infarction  . Hypertension   . Chronic diastolic heart failure   . Edema     Past Surgical History  Procedure Date  . Penile prosthesis implant   . Cholecystectomy   . Right carotid endarterectomy   . Back surgery   . Lumbar laminectomy 03/24/10    L3-4 and L4-5    Family History  Problem Relation Age of Onset  . Thalassemia      Family history    History   Social History  . Marital Status: Married    Spouse Name: N/A    Number of Children: N/A  . Years of Education: N/A   Occupational History  . Not on file.   Social History Main Topics  . Smoking status: Former Smoker    Types: Cigarettes    Quit date: 08/14/1977  . Smokeless tobacco: Never Used  . Alcohol Use: Yes     occasional  . Drug Use: No  . Sexually Active: Not on file   Other Topics Concern  . Not on file   Social History Narrative  . No narrative on file    Allergies  Allergen Reactions  . Amoxicillin     REACTION: unspecified  . Clopidogrel Bisulfate     REACTION: red blue, black blotches  . Hydromorphone Hcl   . Penicillins   . Sulfamethoxazole     REACTION:  unspecified  . Sulfonamide Derivatives     Current Outpatient Prescriptions  Medication Sig Dispense Refill  . acetaminophen (TYLENOL) 650 MG CR tablet Take 650 mg by mouth as directed.        Marland Kitchen aspirin 81 MG tablet Take by mouth daily. 4 tablets daily       . carvedilol (COREG) 6.25 MG tablet Take 3.125 mg by mouth 2 (two) times daily with a meal.        . docusate sodium (COLACE) 100 MG capsule Take 100 mg by mouth as needed.        . furosemide (LASIX) 20 MG tablet        . gabapentin (NEURONTIN) 300 MG capsule Take 300 mg by mouth 3 (three) times daily.        Marland Kitchen losartan (COZAAR) 25 MG tablet Take 25 mg by mouth daily.        . nitroGLYCERIN (NITROSTAT) 0.4 MG SL tablet Place 0.4 mg under the tongue every 5 (five) minutes as needed.        . polyethylene glycol powder (MIRALAX) powder Take 17 g by mouth as needed.        . pravastatin (PRAVACHOL) 20 MG tablet Take 60 mg by mouth at bedtime.        Marland Kitchen  sennosides-docusate sodium (SENOKOT-S) 8.6-50 MG tablet Take 1 tablet by mouth as directed.        . vitamin C (ASCORBIC ACID) 500 MG tablet Take 500 mg by mouth daily.          ROS Negative other than HPI.   PE General Appearance: well developed, well nourished in no acute distress, looks young for stated age HEENT: symmetrical face, PERRLA, good dentition  Neck: no JVD, thyromegaly, or adenopathy, trachea midline Chest: symmetric without deformity Cardiac: PMI non-displaced, RRR, normal S1, S2, no gallop or murmur Lung: clear to ausculation and percussion Vascular: all pulses full without bruits  Abdominal: nondistended, nontender, good bowel sounds, no HSM, no bruits Extremities: no cyanosis, clubbing or edema, no sign of DVT, no varicosities  Skin: normal color, no rashes Neuro: alert and oriented x 3, non-focal Pysch: normal affect Filed Vitals:   04/18/11 1539  BP: 108/50  Pulse: 72  Height: 5' 9.5" (1.765 m)  Weight: 180 lb 1.9 oz (81.702 kg)    EKG  Labs and  Studies Reviewed.   Lab Results  Component Value Date   WBC 6.8 03/15/2010   HGB 8.7* 03/26/2010   HCT 27.5* 03/26/2010   MCV 66.5* 03/15/2010   PLT 194 03/15/2010      Chemistry      Component Value Date/Time   NA 142 03/15/2010 1100   K 4.7 03/15/2010 1100   CL 103 03/15/2010 1100   CO2 33* 03/15/2010 1100   BUN 24* 03/15/2010 1100   CREATININE 1.15 03/15/2010 1100      Component Value Date/Time   CALCIUM 9.6 03/15/2010 1100   ALKPHOS 62 03/15/2010 1100   AST 17 03/15/2010 1100   ALT 15 03/15/2010 1100   BILITOT 0.8 03/15/2010 1100       Lab Results  Component Value Date   CHOL  Value: 129        ATP III CLASSIFICATION:  <200     mg/dL   Desirable  161-096  mg/dL   Borderline High  >=045    mg/dL   High        4/0/9811   Lab Results  Component Value Date   HDL 24* 12/19/2008   Lab Results  Component Value Date   LDLCALC  Value: 75        Total Cholesterol/HDL:CHD Risk Coronary Heart Disease Risk Table                     Men   Women  1/2 Average Risk   3.4   3.3  Average Risk       5.0   4.4  2 X Average Risk   9.6   7.1  3 X Average Risk  23.4   11.0        Use the calculated Patient Ratio above and the CHD Risk Table to determine the patient's CHD Risk.        ATP III CLASSIFICATION (LDL):  <100     mg/dL   Optimal  914-782  mg/dL   Near or Above                    Optimal  130-159  mg/dL   Borderline  956-213  mg/dL   High  >086     mg/dL   Very High 12/19/8467   Lab Results  Component Value Date   TRIG 150* 12/19/2008   Lab Results  Component Value Date  CHOLHDL 5.4 12/19/2008   Lab Results  Component Value Date   HGBA1C  Value: 5.9 (NOTE) The ADA recommends the following therapeutic goal for glycemic control related to Hgb A1c measurement: Goal of therapy: <6.5 Hgb A1c  Reference: American Diabetes Association: Clinical Practice Recommendations 2010, Diabetes Care, 2010, 33: (Suppl  1). 12/18/2008   Lab Results  Component Value Date   ALT 15 03/15/2010   AST 17 03/15/2010   ALKPHOS 62 03/15/2010    BILITOT 0.8 03/15/2010

## 2011-04-18 NOTE — Patient Instructions (Signed)
  Your physician recommends that you schedule a follow-up appointment in: 1 year with Dr. [name]  Your physician has requested that you have a carotid duplex. This test is an ultrasound of the carotid arteries in your neck. It looks at blood flow through these arteries that supply the brain with blood. Allow one hour for this exam. There are no restrictions or special instructions. January 2013

## 2011-04-24 ENCOUNTER — Other Ambulatory Visit: Payer: Self-pay | Admitting: Family Medicine

## 2011-04-24 ENCOUNTER — Ambulatory Visit
Admission: RE | Admit: 2011-04-24 | Discharge: 2011-04-24 | Disposition: A | Discharge status: Home / Self Care | Payer: Medicare Other | Source: Ambulatory Visit | Attending: Family Medicine | Admitting: Family Medicine

## 2011-04-24 DIAGNOSIS — R05 Cough: Secondary | ICD-10-CM

## 2011-04-24 DIAGNOSIS — R0602 Shortness of breath: Secondary | ICD-10-CM

## 2011-04-24 LAB — BUN: BUN: 19 mg/dL (ref 6–23)

## 2011-04-24 LAB — CREATININE, SERUM: Creat: 1.1 mg/dL (ref 0.50–1.35)

## 2011-04-24 MED ORDER — IOHEXOL 300 MG/ML  SOLN
100.0000 mL | Freq: Once | INTRAMUSCULAR | Status: AC | PRN
  Administered 2011-04-24: 100 mL via INTRAVENOUS

## 2011-04-29 ENCOUNTER — Emergency Department (HOSPITAL_COMMUNITY): Payer: Medicare Other

## 2011-04-29 ENCOUNTER — Inpatient Hospital Stay (HOSPITAL_COMMUNITY)
Admission: EM | Admit: 2011-04-29 | Discharge: 2011-05-09 | DRG: 291 | Disposition: A | Discharge status: Home / Self Care | Payer: Medicare Other | Attending: Internal Medicine | Admitting: Internal Medicine

## 2011-04-29 DIAGNOSIS — I509 Heart failure, unspecified: Secondary | ICD-10-CM | POA: Diagnosis present

## 2011-04-29 DIAGNOSIS — G4730 Sleep apnea, unspecified: Secondary | ICD-10-CM | POA: Diagnosis present

## 2011-04-29 DIAGNOSIS — T380X5A Adverse effect of glucocorticoids and synthetic analogues, initial encounter: Secondary | ICD-10-CM | POA: Diagnosis not present

## 2011-04-29 DIAGNOSIS — I10 Essential (primary) hypertension: Secondary | ICD-10-CM | POA: Diagnosis present

## 2011-04-29 DIAGNOSIS — I5023 Acute on chronic systolic (congestive) heart failure: Principal | ICD-10-CM | POA: Diagnosis present

## 2011-04-29 DIAGNOSIS — I2589 Other forms of chronic ischemic heart disease: Secondary | ICD-10-CM | POA: Diagnosis present

## 2011-04-29 DIAGNOSIS — Z8673 Personal history of transient ischemic attack (TIA), and cerebral infarction without residual deficits: Secondary | ICD-10-CM

## 2011-04-29 DIAGNOSIS — D696 Thrombocytopenia, unspecified: Secondary | ICD-10-CM | POA: Diagnosis present

## 2011-04-29 DIAGNOSIS — G609 Hereditary and idiopathic neuropathy, unspecified: Secondary | ICD-10-CM | POA: Diagnosis present

## 2011-04-29 DIAGNOSIS — I2510 Atherosclerotic heart disease of native coronary artery without angina pectoris: Secondary | ICD-10-CM | POA: Diagnosis present

## 2011-04-29 DIAGNOSIS — J9600 Acute respiratory failure, unspecified whether with hypoxia or hypercapnia: Secondary | ICD-10-CM | POA: Diagnosis present

## 2011-04-29 DIAGNOSIS — E785 Hyperlipidemia, unspecified: Secondary | ICD-10-CM | POA: Diagnosis present

## 2011-04-29 DIAGNOSIS — E119 Type 2 diabetes mellitus without complications: Secondary | ICD-10-CM | POA: Diagnosis present

## 2011-04-29 DIAGNOSIS — D509 Iron deficiency anemia, unspecified: Secondary | ICD-10-CM | POA: Diagnosis present

## 2011-04-29 DIAGNOSIS — D72829 Elevated white blood cell count, unspecified: Secondary | ICD-10-CM | POA: Diagnosis not present

## 2011-04-29 DIAGNOSIS — I252 Old myocardial infarction: Secondary | ICD-10-CM

## 2011-04-29 DIAGNOSIS — J441 Chronic obstructive pulmonary disease with (acute) exacerbation: Secondary | ICD-10-CM | POA: Diagnosis present

## 2011-04-30 DIAGNOSIS — I517 Cardiomegaly: Secondary | ICD-10-CM

## 2011-04-30 LAB — COMPREHENSIVE METABOLIC PANEL
Albumin: 3.4 g/dL — ABNORMAL LOW (ref 3.5–5.2)
BUN: 22 mg/dL (ref 6–23)
Creatinine, Ser: 0.95 mg/dL (ref 0.50–1.35)
GFR calc Af Amer: >60 mL/min (ref >60)
Glucose, Bld: 266 mg/dL — ABNORMAL HIGH (ref 70–99)
Total Protein: 6.6 g/dL (ref 6.0–8.3)

## 2011-04-30 LAB — CBC
HCT: 36 % — ABNORMAL LOW (ref 39.0–52.0)
Hemoglobin: 10.6 g/dL — ABNORMAL LOW (ref 13.0–17.0)
Hemoglobin: 11.2 g/dL — ABNORMAL LOW (ref 13.0–17.0)
MCH: 20.3 pg — ABNORMAL LOW (ref 26.0–34.0)
MCH: 20.2 pg — ABNORMAL LOW (ref 26.0–34.0)
MCHC: 31.1 g/dL (ref 30.0–36.0)
MCV: 64.9 fL — ABNORMAL LOW (ref 78.0–100.0)
RBC: 5.22 MIL/uL (ref 4.22–5.81)
RDW: 16 % — ABNORMAL HIGH (ref 11.5–15.5)
WBC: 9.5 10*3/uL (ref 4.0–10.5)

## 2011-04-30 LAB — LIPID PANEL
Cholesterol: 175 mg/dL (ref 0–200)
HDL: 47 mg/dL (ref >39)
Total CHOL/HDL Ratio: 3.7 RATIO

## 2011-04-30 LAB — DIFFERENTIAL
Basophils Relative: 0 % (ref 0–1)
Eosinophils Absolute: 0.1 10*3/uL (ref 0.0–0.7)
Lymphocytes Relative: 11 % — ABNORMAL LOW (ref 12–46)
Monocytes Relative: 8 % (ref 3–12)
Neutro Abs: 7.6 10*3/uL (ref 1.7–7.7)
Neutrophils Relative %: 80 % — ABNORMAL HIGH (ref 43–77)

## 2011-04-30 LAB — CK TOTAL AND CKMB (NOT AT ARMC)
CK, MB: 4.8 ng/mL — ABNORMAL HIGH (ref 0.3–4.0)
Relative Index: INVALID (ref 0.0–2.5)
Relative Index: INVALID (ref 0.0–2.5)
Total CK: 59 U/L (ref 7–232)
Total CK: 47 U/L (ref 7–232)

## 2011-04-30 LAB — BASIC METABOLIC PANEL
CO2: 28 mEq/L (ref 19–32)
Calcium: 8.5 mg/dL (ref 8.4–10.5)
Chloride: 101 mEq/L (ref 96–112)
Glucose, Bld: 190 mg/dL — ABNORMAL HIGH (ref 70–99)
Sodium: 139 mEq/L (ref 135–145)

## 2011-04-30 LAB — TROPONIN I
Troponin I: <0.3 ng/mL (ref <0.30)
Troponin I: <0.3 ng/mL (ref <0.30)
Troponin I: <0.3 ng/mL (ref <0.30)

## 2011-04-30 LAB — PRO B NATRIURETIC PEPTIDE: Pro B Natriuretic peptide (BNP): 2200 pg/mL — ABNORMAL HIGH (ref 0–450)

## 2011-04-30 LAB — MAGNESIUM: Magnesium: 2.1 mg/dL (ref 1.5–2.5)

## 2011-04-30 NOTE — H&P (Signed)
NAMEESTABAN, MAINVILLE NO.:  [id number]  MEDICAL RECORD NO.:  [id number]  LOCATION:  WLED                         FACILITY:  [facility]  PHYSICIAN:  Della Goo, M.D. DATE OF BIRTH:  1923-09-08  DATE OF ADMISSION:  04/30/2011 DATE OF DISCHARGE:                             HISTORY & PHYSICAL   DATE OF ADMISSION:  04/30/2011  PRIMARY CARE PHYSICIAN:  Miguel Aschoff, M.D.  CHIEF COMPLAINT:  Shortness of breath.  HISTORY OF PRESENT ILLNESS:  This is an 77 year old male with a history of systolic congestive heart failure syndrome chronically who presents to the emergency department with complaints of worsening shortness of breath over the past 3-4 days.  He reports being short of breath all the time, but reports it has been worse last 3-4 days.  He reports coughing up of watery sputum.  He denies having any chest pain.  He also denies having any fevers or chills.  EMS had been called to the patient's home secondary to his worsening shortness of breath and he had been found per their report to have O2 saturations of 91%.  The patient was given Solu- Medrol and albuterol treatments and brought to the emergency department.  PAST MEDICAL HISTORY:  Significant for: 1. Coronary artery disease/previous MI in 1979. 2. Systolic congestive heart failure syndrome. 3. Hypertension. 4. Ischemic cardiomyopathy, previous ejection fraction of 45%, July     2009 2-D echo. 5. Dyslipidemia. 6. Peripheral vascular disease. 7. Cerebrovascular disease. 8. History of TIA. 9. Gout. 10.Peripheral neuropathy. 11.Iron-deficiency anemia. 12.Osteoarthritis. 13.Prostate cancer, status post seed implants.  PAST SURGICAL HISTORY: 1. Status post appendectomy. 2. Status post lumbar surgery x2 with residual right foot drop after     last surgery. 3. Right carotid endarterectomy. 4. Right total knee replacement. 5. Cholecystectomy. 6. Right cataract surgery. 7. Right ankle surgery  secondary to infection.  MEDICATIONS:  Include carvedilol, MiraLax, docusate, gabapentin, Prilosec, losartan, Lasix, Mucinex.  ALLERGIES: 1. PENICILLIN causing a rash. 2. DILAUDID and SULFA cause hallucinations.  SOCIAL HISTORY:  The patient is married.  He is a nonsmoker, nondrinker. No history of illicit drug usage.  FAMILY HISTORY:  Noncontributory secondary to age.  REVIEW OF SYSTEMS:  Pertinents as mentioned above.  PHYSICAL EXAMINATION:  GENERAL:  This is an 77 year old thin well- developed elderly Caucasian male who is in no acute distress currently. VITAL SIGNS:  Temperature 98.8, blood pressure 176/65, heart rate 91, respirations 22, O2 sat 95% to 96%. HEENT:  Normocephalic, atraumatic.  Pupils equally round and reactive to light.  Extraocular movements are intact.  Funduscopic benign.  There is no scleral icterus.  Nares are patent bilaterally.  Oropharynx is clear. NECK:  Supple.  Full range of motion.  No thyromegaly, adenopathy, or jugular venous distention. CARDIOVASCULAR:  Regular rate and rhythm.  No murmurs, gallops, or rubs appreciated.  Chest wall nontender.  Chest wall excursion is symmetric. Breathing is unlabored. LUNGS:  Reveal decreased breath sounds with bibasilar rales from the basis to the have lung field mark.  There are no rhonchi, no wheezes. ABDOMEN:  Positive bowel sounds, soft, nontender, nondistended.  No hepatosplenomegaly. EXTREMITIES:  Without cyanosis, clubbing,  or edema. NEUROLOGIC:  Generalized weakness, but otherwise no focal deficits.  LABORATORY STUDIES:  White blood cell count 9.5, hemoglobin 10.6, hematocrit 32.8, MCV 64.8, platelets 141.  Sodium 139, potassium 3.3, chloride 101, CO2 28, BUN 20, creatinine 0.81, and glucose 190.  Cardiac enzymes with a total CK of 69, CK-MB 4.8, and troponin 0.04.  Chest x- ray reveals COPD changes, but otherwise clear.  Beta natriuretic peptide 2200.0. EKG performed revealed a normal sinus rhythm.   No acute ST- segment changes are seen.  ASSESSMENT:  This is an 77 year old male being admitted with: 1. Shortness of breath. 2. Acute systolic congestive heart failure syndrome decompensation 3. Hypertension. 4. Coronary artery disease. 5. Prostate cancer. 6. Osteoarthritis. 7. Dyslipidemia. 8. Hypokalemia. 9. Iron-deficiency anemia.  PLAN:  The patient will be admitted to a telemetry area for monitoring. Cardiac enzymes will be performed.  The patient has been placed on supplemental oxygen and IV Lasix has been ordered and the CHF protocol will be performed.  A 2-D echo study will be ordered since the patient's last 2-D echo study was in 2009.  He is already on medications for his congestive heart failure syndrome, which include beta blocker therapy of carvedilol and ACE receptor blocker therapy of losartan.  These will be  continued.  His electrolytes will be monitored for further potassium  replacement and he will be given potassium replacement as needed.  DVT  prophylaxis has been ordered and code status will be further discussed.     Della Goo, M.D.     HJ/MEDQ  D:  04/30/2011  T:  04/30/2011  Job:  [id number]  cc:   Miguel Aschoff, M.D.  Electronically Signed by Della Goo M.D. on 04/30/2011 09:57:21 PM

## 2011-05-01 DIAGNOSIS — R0602 Shortness of breath: Secondary | ICD-10-CM

## 2011-05-01 LAB — CBC
HCT: 36.2 % — ABNORMAL LOW (ref 39.0–52.0)
Hemoglobin: 11.6 g/dL — ABNORMAL LOW (ref 13.0–17.0)
MCHC: 32 g/dL (ref 30.0–36.0)
RDW: 16 % — ABNORMAL HIGH (ref 11.5–15.5)
WBC: 13.9 10*3/uL — ABNORMAL HIGH (ref 4.0–10.5)

## 2011-05-01 LAB — BASIC METABOLIC PANEL
BUN: 36 mg/dL — ABNORMAL HIGH (ref 6–23)
Chloride: 99 mEq/L (ref 96–112)
GFR calc Af Amer: >60 mL/min (ref >60)
GFR calc non Af Amer: >60 mL/min (ref >60)
Potassium: 4.5 mEq/L (ref 3.5–5.1)
Sodium: 140 mEq/L (ref 135–145)

## 2011-05-02 LAB — CBC
Hemoglobin: 11.4 g/dL — ABNORMAL LOW (ref 13.0–17.0)
MCH: 20.2 pg — ABNORMAL LOW (ref 26.0–34.0)
Platelets: 150 10*3/uL (ref 150–400)
RBC: 5.63 MIL/uL (ref 4.22–5.81)
WBC: 10.9 10*3/uL — ABNORMAL HIGH (ref 4.0–10.5)

## 2011-05-02 LAB — BASIC METABOLIC PANEL
CO2: 30 mEq/L (ref 19–32)
Chloride: 92 mEq/L — ABNORMAL LOW (ref 96–112)
GFR calc Af Amer: >60 mL/min (ref >60)
Potassium: 4.5 mEq/L (ref 3.5–5.1)
Sodium: 135 mEq/L (ref 135–145)

## 2011-05-02 LAB — PRO B NATRIURETIC PEPTIDE: Pro B Natriuretic peptide (BNP): 474.1 pg/mL — ABNORMAL HIGH (ref 0–450)

## 2011-05-02 LAB — MAGNESIUM: Magnesium: 2.3 mg/dL (ref 1.5–2.5)

## 2011-05-03 ENCOUNTER — Inpatient Hospital Stay (HOSPITAL_COMMUNITY): Payer: Medicare Other

## 2011-05-03 LAB — URINALYSIS, ROUTINE W REFLEX MICROSCOPIC
Bilirubin Urine: NEGATIVE
Ketones, ur: NEGATIVE mg/dL
Nitrite: NEGATIVE
pH: 5 (ref 5.0–8.0)

## 2011-05-03 LAB — CBC
HCT: 35 % — ABNORMAL LOW (ref 39.0–52.0)
Hemoglobin: 10.8 g/dL — ABNORMAL LOW (ref 13.0–17.0)
MCHC: 30.9 g/dL (ref 30.0–36.0)
RBC: 5.41 MIL/uL (ref 4.22–5.81)
WBC: 11.9 10*3/uL — ABNORMAL HIGH (ref 4.0–10.5)

## 2011-05-03 LAB — BASIC METABOLIC PANEL
BUN: 46 mg/dL — ABNORMAL HIGH (ref 6–23)
CO2: 30 mEq/L (ref 19–32)
Chloride: 96 mEq/L (ref 96–112)
GFR calc non Af Amer: 59 mL/min — ABNORMAL LOW (ref >60)
Glucose, Bld: 274 mg/dL — ABNORMAL HIGH (ref 70–99)
Potassium: 4.9 mEq/L (ref 3.5–5.1)
Sodium: 136 mEq/L (ref 135–145)

## 2011-05-03 LAB — EXPECTORATED SPUTUM ASSESSMENT W GRAM STAIN, RFLX TO RESP C

## 2011-05-03 LAB — GLUCOSE, CAPILLARY: Glucose-Capillary: 330 mg/dL — ABNORMAL HIGH (ref 70–99)

## 2011-05-03 LAB — URINE MICROSCOPIC-ADD ON

## 2011-05-04 LAB — CBC
HCT: 32.1 — ABNORMAL LOW
HCT: 36.3 — ABNORMAL LOW
Hemoglobin: 11.6 — ABNORMAL LOW
Hemoglobin: 12.5 — ABNORMAL LOW
MCHC: 31.8
MCHC: 31.9
MCV: 66.7 — ABNORMAL LOW
MCV: 67.8 — ABNORMAL LOW
Platelets: 144 — ABNORMAL LOW
RBC: 4.74
RBC: 5.45
RDW: 14.5
RDW: 14.6
WBC: 6.3

## 2011-05-04 LAB — DIFFERENTIAL
Basophils Absolute: 0.1
Basophils Relative: 0
Basophils Relative: 1
Eosinophils Absolute: 0.1
Lymphocytes Relative: 20
Lymphs Abs: 1.5
Monocytes Absolute: 0.4
Monocytes Relative: 7
Neutro Abs: 4.4
Neutro Abs: 5.8
Neutrophils Relative %: 67
Neutrophils Relative %: 73

## 2011-05-04 LAB — URINE CULTURE
Colony Count: NO GROWTH
Culture: NO GROWTH

## 2011-05-04 LAB — BASIC METABOLIC PANEL
BUN: 45 mg/dL — ABNORMAL HIGH (ref 6–23)
CO2: 32 mEq/L (ref 19–32)
CO2: 29
Calcium: 9.5 mg/dL (ref 8.4–10.5)
Calcium: 9.3
Chloride: 111
Creatinine, Ser: 1.13 mg/dL (ref 0.50–1.35)
Creatinine, Ser: 0.95
Creatinine, Ser: 1.06
GFR calc Af Amer: >60
GFR calc Af Amer: >60
GFR calc non Af Amer: >60
Glucose, Bld: 298 mg/dL — ABNORMAL HIGH (ref 70–99)
Glucose, Bld: 142 — ABNORMAL HIGH
Potassium: 3.5
Sodium: 136

## 2011-05-04 LAB — IRON AND TIBC
Saturation Ratios: 41 % (ref 20–55)
TIBC: 264 ug/dL (ref 215–435)

## 2011-05-04 LAB — URINALYSIS, ROUTINE W REFLEX MICROSCOPIC
Bilirubin Urine: NEGATIVE
Glucose, UA: NEGATIVE
Nitrite: NEGATIVE
Nitrite: NEGATIVE
Protein, ur: 100 — AB
Specific Gravity, Urine: 1.01
Urobilinogen, UA: 1
pH: 6
pH: 6.5

## 2011-05-04 LAB — COMPREHENSIVE METABOLIC PANEL
ALT: 13
AST: 16
Albumin: 3 — ABNORMAL LOW
Alkaline Phosphatase: 44
Alkaline Phosphatase: 53
BUN: 15
CO2: 28
Calcium: 8.9
Chloride: 110
GFR calc Af Amer: >60
GFR calc non Af Amer: >60
Glucose, Bld: 124 — ABNORMAL HIGH
Potassium: 3.6
Sodium: 137
Total Bilirubin: 1
Total Protein: 5.1 — ABNORMAL LOW
Total Protein: 6

## 2011-05-04 LAB — RETICULOCYTES
RBC.: 5.45
Retic Count, Absolute: 87.2
Retic Ct Pct: 1.6

## 2011-05-04 LAB — CK TOTAL AND CKMB (NOT AT ARMC)
Relative Index: INVALID
Total CK: 69

## 2011-05-04 LAB — VITAMIN B12: Vitamin B-12: 498 (ref 211–911)

## 2011-05-04 LAB — TROPONIN I: Troponin I: 0.03

## 2011-05-04 LAB — URINE MICROSCOPIC-ADD ON

## 2011-05-05 ENCOUNTER — Inpatient Hospital Stay (HOSPITAL_COMMUNITY): Payer: Medicare Other

## 2011-05-05 LAB — BASIC METABOLIC PANEL
BUN: 18
CO2: 31
CO2: 30
Calcium: 9.6
Chloride: 102
Chloride: 106
Creatinine, Ser: 1.02
Creatinine, Ser: 1.06
GFR calc Af Amer: >60
Glucose, Bld: 115 — ABNORMAL HIGH
Glucose, Bld: 136 — ABNORMAL HIGH
Potassium: 4

## 2011-05-05 LAB — GLUCOSE, CAPILLARY
Glucose-Capillary: 345 mg/dL — ABNORMAL HIGH (ref 70–99)
Glucose-Capillary: 250 mg/dL — ABNORMAL HIGH (ref 70–99)
Glucose-Capillary: 205 mg/dL — ABNORMAL HIGH (ref 70–99)
Glucose-Capillary: 248 mg/dL — ABNORMAL HIGH (ref 70–99)

## 2011-05-05 LAB — CBC
HCT: 36.3 — ABNORMAL LOW
Hemoglobin: 11.5 — ABNORMAL LOW
MCV: 65.3 — ABNORMAL LOW
RBC: 5.57
WBC: 5.9

## 2011-05-06 LAB — CBC
MCH: 20.5 pg — ABNORMAL LOW (ref 26.0–34.0)
MCHC: 31.7 g/dL (ref 30.0–36.0)
MCV: 64.6 fL — ABNORMAL LOW (ref 78.0–100.0)
Platelets: 159 10*3/uL (ref 150–400)
RBC: 5.62 MIL/uL (ref 4.22–5.81)

## 2011-05-06 LAB — BASIC METABOLIC PANEL
BUN: 36 mg/dL — ABNORMAL HIGH (ref 6–23)
CO2: 34 mEq/L — ABNORMAL HIGH (ref 19–32)
Calcium: 9.1 mg/dL (ref 8.4–10.5)
Chloride: 96 mEq/L (ref 96–112)
Creatinine, Ser: 1.05 mg/dL (ref 0.50–1.35)
GFR calc Af Amer: >60 mL/min (ref >60)

## 2011-05-06 LAB — GLUCOSE, CAPILLARY
Glucose-Capillary: 139 mg/dL — ABNORMAL HIGH (ref 70–99)
Glucose-Capillary: 177 mg/dL — ABNORMAL HIGH (ref 70–99)
Glucose-Capillary: 161 mg/dL — ABNORMAL HIGH (ref 70–99)

## 2011-05-07 LAB — GLUCOSE, CAPILLARY: Glucose-Capillary: 216 mg/dL — ABNORMAL HIGH (ref 70–99)

## 2011-05-08 DIAGNOSIS — J40 Bronchitis, not specified as acute or chronic: Secondary | ICD-10-CM

## 2011-05-08 LAB — CBC
HCT: 34.1 % — ABNORMAL LOW (ref 39.0–52.0)
Hemoglobin: 10.9 g/dL — ABNORMAL LOW (ref 13.0–17.0)
MCV: 64.3 fL — ABNORMAL LOW (ref 78.0–100.0)
RBC: 5.3 MIL/uL (ref 4.22–5.81)
WBC: 14.1 10*3/uL — ABNORMAL HIGH (ref 4.0–10.5)

## 2011-05-08 LAB — BASIC METABOLIC PANEL
BUN: 40 mg/dL — ABNORMAL HIGH (ref 6–23)
CO2: 33 mEq/L — ABNORMAL HIGH (ref 19–32)
Chloride: 97 mEq/L (ref 96–112)
Creatinine, Ser: 1.09 mg/dL (ref 0.50–1.35)
GFR calc Af Amer: >60 mL/min (ref >60)
Glucose, Bld: 208 mg/dL — ABNORMAL HIGH (ref 70–99)
Potassium: 4.4 mEq/L (ref 3.5–5.1)

## 2011-05-08 LAB — GLUCOSE, CAPILLARY
Glucose-Capillary: 309 mg/dL — ABNORMAL HIGH (ref 70–99)
Glucose-Capillary: 180 mg/dL — ABNORMAL HIGH (ref 70–99)

## 2011-05-08 LAB — CULTURE, RESPIRATORY W GRAM STAIN

## 2011-05-09 LAB — GLUCOSE, CAPILLARY: Glucose-Capillary: 186 mg/dL — ABNORMAL HIGH (ref 70–99)

## 2011-05-11 LAB — CROSSMATCH

## 2011-05-11 LAB — BASIC METABOLIC PANEL
BUN: 20
BUN: 24 — ABNORMAL HIGH
CO2: 23
CO2: 29
CO2: 29
Calcium: 8.1 — ABNORMAL LOW
Calcium: 8.2 — ABNORMAL LOW
Calcium: 8.6
Calcium: 8.7
Calcium: 9.1
Chloride: 109
Creatinine, Ser: 0.71
Creatinine, Ser: 1.14
Creatinine, Ser: 1.14
GFR calc Af Amer: >60
GFR calc Af Amer: >60
GFR calc Af Amer: >60
GFR calc Af Amer: >60
GFR calc Af Amer: >60
GFR calc non Af Amer: 55 — ABNORMAL LOW
GFR calc non Af Amer: 59 — ABNORMAL LOW
GFR calc non Af Amer: >60
GFR calc non Af Amer: >60
GFR calc non Af Amer: >60
Glucose, Bld: 152 — ABNORMAL HIGH
Glucose, Bld: 157 — ABNORMAL HIGH
Potassium: 3.5
Potassium: 3.9
Potassium: 4.1
Sodium: 136
Sodium: 137
Sodium: 138
Sodium: 139
Sodium: 140

## 2011-05-11 LAB — PROTIME-INR
INR: 1.1
INR: 1.2
INR: 1.4
INR: 1.6 — ABNORMAL HIGH
INR: 2.1 — ABNORMAL HIGH
Prothrombin Time: 14.2
Prothrombin Time: 17.6 — ABNORMAL HIGH
Prothrombin Time: 19.7 — ABNORMAL HIGH
Prothrombin Time: 23.5 — ABNORMAL HIGH
Prothrombin Time: 23.7 — ABNORMAL HIGH
Prothrombin Time: 24.5 — ABNORMAL HIGH

## 2011-05-11 LAB — DIFFERENTIAL
Basophils Relative: 1
Basophils Relative: 0
Eosinophils Absolute: 0.3
Eosinophils Relative: 3
Eosinophils Relative: 4
Lymphs Abs: 1.1
Monocytes Absolute: 0.6
Monocytes Relative: 10
Neutrophils Relative %: 49
Neutrophils Relative %: 74

## 2011-05-11 LAB — COMPREHENSIVE METABOLIC PANEL
ALT: 25
Albumin: 4.5
BUN: 26 — ABNORMAL HIGH
BUN: 20
CO2: 27
Calcium: 9.7
Calcium: 8.8
GFR calc non Af Amer: >60
Glucose, Bld: 109 — ABNORMAL HIGH
Glucose, Bld: 128 — ABNORMAL HIGH
Potassium: 4.3
Sodium: 140
Sodium: 138
Total Protein: 7.1
Total Protein: 5.4 — ABNORMAL LOW

## 2011-05-11 LAB — URINALYSIS, ROUTINE W REFLEX MICROSCOPIC
Bilirubin Urine: NEGATIVE
Hgb urine dipstick: NEGATIVE
Ketones, ur: NEGATIVE
Nitrite: NEGATIVE
Protein, ur: NEGATIVE
Protein, ur: NEGATIVE
Specific Gravity, Urine: 1.021
Urobilinogen, UA: 1
Urobilinogen, UA: 0.2

## 2011-05-11 LAB — CK TOTAL AND CKMB (NOT AT ARMC)
CK, MB: 6.5 — ABNORMAL HIGH
CK, MB: 8.4 — ABNORMAL HIGH
Relative Index: 3.9 — ABNORMAL HIGH
Relative Index: 4.2 — ABNORMAL HIGH
Relative Index: 5.2 — ABNORMAL HIGH
Total CK: 112
Total CK: 163

## 2011-05-11 LAB — CBC
HCT: 29.1 — ABNORMAL LOW
HCT: 29.2 — ABNORMAL LOW
HCT: 29.7 — ABNORMAL LOW
HCT: 32.7 — ABNORMAL LOW
Hemoglobin: 12.5 — ABNORMAL LOW
Hemoglobin: 10.4 — ABNORMAL LOW
Hemoglobin: 7.8 — CL
Hemoglobin: 9.3 — ABNORMAL LOW
Hemoglobin: 9.6 — ABNORMAL LOW
MCHC: 31.2
MCHC: 31.7
MCHC: 31.8
MCHC: 32.3
MCV: 65.7 — ABNORMAL LOW
MCV: 68.3 — ABNORMAL LOW
MCV: 68.5 — ABNORMAL LOW
MCV: 69.1 — ABNORMAL LOW
MCV: 69.8 — ABNORMAL LOW
Platelets: 186
Platelets: 111 — ABNORMAL LOW
Platelets: 224
Platelets: 89 — ABNORMAL LOW
RBC: 4.23
RBC: 4.25
RBC: 4.35
RBC: 4.46
RBC: 4.69
RDW: 13.5
RDW: 16.1 — ABNORMAL HIGH
RDW: 17.3 — ABNORMAL HIGH
WBC: 10
WBC: 10.3
WBC: 10.9 — ABNORMAL HIGH
WBC: 7.3
WBC: 7.8
WBC: 8.1

## 2011-05-11 LAB — HEPARIN LEVEL (UNFRACTIONATED)
Heparin Unfractionated: 0.11 — ABNORMAL LOW
Heparin Unfractionated: 0.25 — ABNORMAL LOW
Heparin Unfractionated: 0.5

## 2011-05-11 LAB — TYPE AND SCREEN
ABO/RH(D): A POS
Antibody Screen: NEGATIVE

## 2011-05-11 LAB — LIPID PANEL
LDL Cholesterol: 80
Triglycerides: 165 — ABNORMAL HIGH
VLDL: 33

## 2011-05-11 LAB — GLUCOSE, CAPILLARY: Glucose-Capillary: 112 mg/dL — ABNORMAL HIGH (ref 70–99)

## 2011-05-11 LAB — URINE CULTURE

## 2011-05-11 LAB — APTT: aPTT: 33

## 2011-05-11 LAB — TROPONIN I: Troponin I: 0.76

## 2011-05-11 LAB — B-NATRIURETIC PEPTIDE (CONVERTED LAB): Pro B Natriuretic peptide (BNP): 624 — ABNORMAL HIGH

## 2011-05-11 LAB — URINE MICROSCOPIC-ADD ON

## 2011-05-11 NOTE — Progress Notes (Signed)
[name], [name] NO.:  [id number]  MEDICAL RECORD NO.:  [id number]  LOCATION:  1440                         FACILITY:  [facility]  PHYSICIAN:  Elliot Cousin, M.D.    DATE OF BIRTH:  11/03/1923                                PROGRESS NOTE   CURRENT DIAGNOSES/PROBLEM LIST: 1. Chronic obstructive pulmonary disease with bronchitic exacerbation. 2. Radiographic findings of emphysema and chronic bibasilar     atelectasis versus scarring. 3. Chronic systolic congestive heart failure with exacerbation.  The     patient's 2-D echocardiogram on April 30, 2011, revealed an     ejection fraction of 40% to 45%. 4. Known history of coronary artery disease and ischemic     cardiomyopathy. 5. Newly diagnosed diabetes mellitus with a hemoglobin A1c of 6.6.     His hyperglycemia is due in part to steroid therapy. 6. Transient thrombocytopenia, resolved. 7. Anemia with a hemoglobin ranging from 11.5 to 10.5.  His anemia     panel revealed a total iron of 109, TIBC of 264, vitamin B12 of     711, folate of 11.8, and ferritin of 402. 8. Leukocytosis secondary to steroid therapy. 9. Hypertension.  PROCEDURE PERFORMED: 1. Chest x-ray on May 05, 2011.  The results revealed     emphysematous changes with chronic bibasilar atelectasis versus     scarring.  No acute abnormalities. 2. Chest x-ray on May 03, 2011.  The results revealed bibasilar     linear scarring or atelectasis, but no acute disease. 3. 2-D echocardiogram without contrast on April 30, 2011.  The     results revealed left ventricular thickness was increased in the     pattern of mild LVH.  Systolic function was mildly to moderately     reduced.  Ejection estimated to be 40% to 45%.  Akinesis of the     apical myocardium.  Also, akinesis of the mid anterior septal     myocardium.  Grade 1 diastolic dysfunction.  HISTORY OF PRESENTING ILLNESS: The patient is an 77 year old man with a past medical  history significant for COPD and ischemic cardiomyopathy, who presented to the emergency department on April 30, 2011 with a chief complaint of shortness of breath and a productive cough.  In the emergency department, he was noted to be borderline hypoxic.  His chest x-ray revealed changes of COPD, but otherwise clear.  His BNP was elevated at 2200.  His EKG revealed normal sinus rhythm with no significant ST or T- wave abnormalities seen.  He was admitted for further evaluation and management.   HOSPITAL COURSE: The patient was restarted on all of his cardiac medications.  Lasix was, however, given intravenously at 40 mg IV every 12 hours.  Oxygen therapy was applied to keep his oxygen saturations greater than 90%.  His potassium chloride was repleted orally as it was borderline low on admission.  Albuterol and Atrovent nebulizations were given for notable bronchospasms.  For further evaluation, a number of studies were ordered.  His cardiac enzymes were unremarkable and therefore he ruled out for myocardial infarction.  His fasting lipid profile revealed a total cholesterol  of 175, triglycerides of 79, HDL cholesterol of 47, and LDL cholesterol of 112.  His TSH was within normal limits at 1.7. His blood magnesium was 2.1.  His followup BNP improved to 474.  The patient diuresed well; however, he continued to have persistent bronchospasms.  His followup chest x-ray revealed no evidence of pulmonary edema.  It was, therefore, believed that his shortness of breath was more likely secondary to COPD with bronchitic exacerbation rather than decompensated congestive heart failure.  Steroid therapy was started.  Mucinex was started.  Antibiotic therapy was started with Avelox.  His venous glucose was noted to be elevated even before steroid therapy was started.  His hemoglobin A1c was elevated at 6.6.  He was, therefore, diagnosed with type 2 diabetes mellitus, worsened with steroid  therapy.  Diabetic teaching was initiated.  Lantus and sliding scale NovoLog were started.  However, due to the excessive hyperglycemia, Lantus was discontinued in favor of b.i.d. NPH.  The patient was also noted to be anemic.  An anemia panel was ordered and it revealed no significant abnormalities.  It appeared that his anemia is secondary to chronic disease.  His platelet count did fall transiently to 149.  It normalized and as of today, it is 159.  There has been a relative slow resolution to the patient's bronchospasms.  For the past several days, up until today, he has had impressive rhonchus wheezing.  He continued to have a productive sputum. A sputum culture was ordered; however, it only revealed normal oropharyngeal flora.  Yesterday, Solu-Medrol was titrated up a little. A Mucomyst nebulizer was given.  His followup chest x-ray revealed only emphysematous changes.  Today, for the first time during the hospitalization, the patient has significantly few were rhonchus bronchospasms.  Of note, Avelox was discontinued in favor of azithromycin.  DISPOSITION: The patient is now improving, although he is still not quite ready for hospital discharge.  We will try to decrease Solu-Medrol in the morning not only because he is improved symptomatically, but also because it is contributing to worsening glycemic control.  He has been given instructions on diabetic teaching.  It is likely that he will require only once or twice daily Lantus as steroid therapy is titrated down and subsequently off in the outpatient setting following the discontinuation of a prednisone taper.  Hopefully, he can be discharged on a prednisone taper, antibiotic therapy, bronchodilator inhalers, and diabetes therapy with Lantus in the next 48 hours.     Elliot Cousin, M.D.     DF/MEDQ  D:  05/06/2011  T:  05/06/2011  Job:  [id number]  Electronically Signed by Elliot Cousin M.D. on 05/11/2011 11:12:14 PM

## 2011-05-12 DIAGNOSIS — J40 Bronchitis, not specified as acute or chronic: Secondary | ICD-10-CM

## 2011-05-12 LAB — CBC
HCT: 28.5 — ABNORMAL LOW
HCT: 30.5 — ABNORMAL LOW
Hemoglobin: 10.8 — ABNORMAL LOW
Hemoglobin: 8.6 — ABNORMAL LOW
Hemoglobin: 9.2 — ABNORMAL LOW
Hemoglobin: 9.9 — ABNORMAL LOW
MCHC: 32.4
MCHC: 32.5
MCHC: 32.6
MCV: 68.1 — ABNORMAL LOW
MCV: 68.9 — ABNORMAL LOW
MCV: 69.2 — ABNORMAL LOW
Platelets: 271
Platelets: 290
RBC: 3.9 — ABNORMAL LOW
RBC: 4.13 — ABNORMAL LOW
RBC: 4.5
RBC: 4.77
RDW: 17.4 — ABNORMAL HIGH
WBC: 10.8 — ABNORMAL HIGH

## 2011-05-12 LAB — PROTIME-INR
INR: 1.4
INR: 1.4
INR: 1.6 — ABNORMAL HIGH
Prothrombin Time: 17.2 — ABNORMAL HIGH
Prothrombin Time: 17.3 — ABNORMAL HIGH
Prothrombin Time: 19.3 — ABNORMAL HIGH

## 2011-05-12 LAB — BASIC METABOLIC PANEL
BUN: 17
CO2: 25
Calcium: 8.6
Calcium: 8.6
Creatinine, Ser: 1.05
Creatinine, Ser: 1.15
GFR calc Af Amer: >60
GFR calc non Af Amer: >60

## 2011-05-12 LAB — IRON AND TIBC: Saturation Ratios: 16 — ABNORMAL LOW

## 2011-05-12 LAB — HEPARIN LEVEL (UNFRACTIONATED): Heparin Unfractionated: 0.35

## 2011-05-23 NOTE — Discharge Summary (Signed)
[name], [name]                     ACCOUNT NO.:  [id number]  MEDICAL RECORD NO.:  [id number]  LOCATION:  1440                         FACILITY:  [facility]  PHYSICIAN:  Kathlen Mody, MD       DATE OF BIRTH:  November 28, 1923  DATE OF ADMISSION:  04/29/2011 DATE OF DISCHARGE:  05/09/2011                              DISCHARGE SUMMARY   DISCHARGE DIAGNOSES: 1. Chronic obstructive pulmonary disease exacerbation with bronchitis. 2. Chronic systolic heart failure with exacerbation. A 2D     echocardiogram on September 16 that showed an EF of 40-45%. 3. coronary artery disease and ischemic cardiomyopathy. 4. Diabetes mellitus with Hemoglobin A1c of 6.6. 5. Transient thrombocytopenia, resolved. 6. Anemia of chronic disease. 7. Leukocytosis secondary to steroid therapy. 8. Hypertension. 9. Methicillin-resistant Staphylococcus aureus in the sputum.  DISCHARGE MEDICATIONS: 1. Albuterol nebulizers q.6 hours as needed. 2. Benzonatate 100 mg 3 times a day. 3. Symbicort one inhalation twice a day. 4. Mucinex 1 tab twice a day. 5. Atrovent q.6 hours as needed. 6. Prednisone 60 mg for 2 days, followed by 40 mg for 3 days, followed     by 30 mg for 3 days, followed by 20 mg for 3 days, followed by 10     mg for 3 days, for a slow taper. 7. Senna and Colace 8.6/50 mg 2 tabs daily at bedtime. 8. Coreg 6.25 mg half a tab twice daily. 9. Gabapentin 300 mg 2 capsules 3 times a day. 10.Guaifenesin 15 mL q.4 hours as needed. 11.Lasix 1 tab daily. 12.Losartan 25 mg daily. 13.Pravastatin 60 mg daily. 14.Prilosec 20 mg daily. 15.Tylenol 650 mg q.6 hours as needed. 16.Vitamin C 1 tab daily.  PROCEDURES PERFORMED:  Chest x-ray May 05, 2011, revealed emphysema changes with chronic bibasilar atelectasis.  Chest x-ray on May 03, 2011, revealed bibasilar linear scarring atelectasis.  No acute disease.  2D echocardiogram without contrast showed an EF of 40-45% with akinesis of the apical myocardium  and mid anterior septal myocardium grade 1 diastolic dysfunction.  77 year old man with history of significant COPD and ischemic cardiomyopathy who presented to ER, chief complaint of shortness of breath and productive cough.  He was noted to be borderline hypoxic with this chest x-ray revealing changes of COPD.  He was admitted for further evaluation and management of COPD exacerbation.  HOSPITAL COURSE: 1. COPD exacerbation.  Started on IV steroids and nebulizers and he     was also worked up for ACS with cardiac enzymes which have been     negative.  He was also started on antibiotic therapy with Avelox.     Over the course of his hospitalization his shortness of breath has     improved.  He continued to have productive sputum.  Sputum culture     was obtained and it showed MRSA.  Infectious Disease consult was     called to see if we have to treat the MRSA in his sputum though he     has been afebrile with no leukocytosis.  Infectious disease consult     from Dr. Orvan Falconer was obtained and Dr. Orvan Falconer recommended no  further treatment could be possible colonization in the sputum.     His Avelox was stopped and he was discharged home on a prednisone     taper with the Symbicort inhalation and nebulizers as needed.  He     was also ambulated in the hallway for post hospital evaluation and     home oxygen.  Patient's oxygen saturation score well above 90% at     rest and on ambulation. 2. Systolic heart failure, compensated. 3. Hyperlipidemia stable. 4. Steroid-induced hyperglycemia, borderline diabetic, was on sliding     scale in the hospital. 5. Peripheral neuropathy. 6. Anemia of chronic disease.  PHYSICAL EXAMINATION:  VITAL SIGNS:  On the day of discharge, patient's vitals were within normal limits. GENERAL:  HE was alert, afebrile, comfortable, oriented x3. CARDIOVASCULAR:  S1, S2 heard. RESPIRATORY:  Chest clear to auscultation bilaterally.  No wheezing  or rhonchi. ABDOMEN:  Soft, nontender. EXTREMITIES:  No pedal edema.  The patient was hemodynamically stable for discharge.  He was recommended to follow with his PCP as needed.          ______________________________ Kathlen Mody, MD     VA/MEDQ  D:  05/22/2011  T:  05/23/2011  Job:  [id number]  Electronically Signed by Kathlen Mody MD on 05/23/2011 10:33:35 PM

## 2011-05-24 LAB — CROSSMATCH: Antibody Screen: NEGATIVE

## 2011-05-24 LAB — ABO/RH: ABO/RH(D): A POS

## 2011-05-29 ENCOUNTER — Emergency Department (INDEPENDENT_AMBULATORY_CARE_PROVIDER_SITE_OTHER): Payer: Medicare Other

## 2011-05-29 ENCOUNTER — Encounter (HOSPITAL_BASED_OUTPATIENT_CLINIC_OR_DEPARTMENT_OTHER): Payer: Self-pay | Admitting: *Deleted

## 2011-05-29 ENCOUNTER — Emergency Department (HOSPITAL_BASED_OUTPATIENT_CLINIC_OR_DEPARTMENT_OTHER)
Admission: EM | Admit: 2011-05-29 | Discharge: 2011-05-29 | Disposition: A | Discharge status: Home / Self Care | Payer: Medicare Other | Attending: Emergency Medicine | Admitting: Emergency Medicine

## 2011-05-29 DIAGNOSIS — I10 Essential (primary) hypertension: Secondary | ICD-10-CM | POA: Insufficient documentation

## 2011-05-29 DIAGNOSIS — Y9229 Other specified public building as the place of occurrence of the external cause: Secondary | ICD-10-CM | POA: Insufficient documentation

## 2011-05-29 DIAGNOSIS — T148XXA Other injury of unspecified body region, initial encounter: Secondary | ICD-10-CM

## 2011-05-29 DIAGNOSIS — S0180XA Unspecified open wound of other part of head, initial encounter: Secondary | ICD-10-CM | POA: Insufficient documentation

## 2011-05-29 DIAGNOSIS — I2510 Atherosclerotic heart disease of native coronary artery without angina pectoris: Secondary | ICD-10-CM | POA: Insufficient documentation

## 2011-05-29 DIAGNOSIS — R0789 Other chest pain: Secondary | ICD-10-CM

## 2011-05-29 DIAGNOSIS — W101XXA Fall (on)(from) sidewalk curb, initial encounter: Secondary | ICD-10-CM | POA: Insufficient documentation

## 2011-05-29 DIAGNOSIS — R51 Headache: Secondary | ICD-10-CM

## 2011-05-29 DIAGNOSIS — W19XXXA Unspecified fall, initial encounter: Secondary | ICD-10-CM

## 2011-05-29 DIAGNOSIS — S0003XA Contusion of scalp, initial encounter: Secondary | ICD-10-CM

## 2011-05-29 DIAGNOSIS — G319 Degenerative disease of nervous system, unspecified: Secondary | ICD-10-CM

## 2011-05-29 DIAGNOSIS — S20219A Contusion of unspecified front wall of thorax, initial encounter: Secondary | ICD-10-CM | POA: Insufficient documentation

## 2011-05-29 DIAGNOSIS — S0990XA Unspecified injury of head, initial encounter: Secondary | ICD-10-CM | POA: Insufficient documentation

## 2011-05-29 DIAGNOSIS — IMO0002 Reserved for concepts with insufficient information to code with codable children: Secondary | ICD-10-CM

## 2011-05-29 DIAGNOSIS — S098XXA Other specified injuries of head, initial encounter: Secondary | ICD-10-CM

## 2011-05-29 DIAGNOSIS — Z79899 Other long term (current) drug therapy: Secondary | ICD-10-CM | POA: Insufficient documentation

## 2011-05-29 DIAGNOSIS — Z8679 Personal history of other diseases of the circulatory system: Secondary | ICD-10-CM | POA: Insufficient documentation

## 2011-05-29 MED ORDER — LIDOCAINE HCL (PF) 1 % IJ SOLN
5.0000 mL | Freq: Once | INTRAMUSCULAR | Status: AC
  Administered 2011-05-29: 5 mL via INTRADERMAL
  Filled 2011-05-29: qty 5

## 2011-05-29 NOTE — ED Notes (Signed)
Patient states he was walking out of [employer] on [address] and fell.  No loc.  Abrasion to left forehead and left elbow.  Went to see his PCP, was given a Td and sent here for further evaluation.

## 2011-05-29 NOTE — ED Provider Notes (Signed)
History     CSN: [id number] Arrival date & time: 05/29/2011 11:23 AM  Chief Complaint  Patient presents with  . Fall     HPI  77 year old male presents after blunt head trauma and fall. Patient states that he was at a restaurant and stepped off the curb. He states that he misjudged and fell forward striking the right side of his body as well as the right side of his head. He did not have loss of consciousness. He denies neck pain, back pain. He denies numbness, tingling, weakness of his extremities. She was seen at his primary care doctor's office and given a tetanus and referred to the emergency department for a CT scan of his head. He takes an aspirin daily. He does complain of some right sided rib pain which is worse with moving and deep breathing. He denies shortness of breath  Past Medical History  Diagnosis Date  . Coronary artery disease     native vessel  . Carotid artery stenosis     without infarction  . Hypertension   . Chronic diastolic heart failure   . Edema   . Bronchitis, acute   . Mini stroke     20 yrs ago during cartodid surgery    Past Surgical History  Procedure Date  . Penile prosthesis implant   . Cholecystectomy   . Right carotid endarterectomy   . Back surgery   . Lumbar laminectomy 03/24/10    L3-4 and L4-5  . Ankle surgery     bone infection    Family History  Problem Relation Age of Onset  . Thalassemia      Family history    History  Substance Use Topics  . Smoking status: Former Smoker    Types: Cigarettes    Quit date: 08/14/1977  . Smokeless tobacco: Never Used  . Alcohol Use: Yes     occasional    Review of Systems Negative except as noted in history of present illness  Allergies  Amoxicillin; Clopidogrel bisulfate; Dilaudid; Penicillins; Sulfamethoxazole; and Sulfonamide derivatives  Home Medications   Current Outpatient Rx  Name Route Sig Dispense Refill  . ACETAMINOPHEN ER 650 MG PO TBCR Oral Take 650 mg by mouth as  directed.      . ASPIRIN 81 MG PO TABS Oral Take by mouth daily. 4 tablets daily     . CARVEDILOL 6.25 MG PO TABS Oral Take 3.125 mg by mouth 2 (two) times daily with a meal.      . DOCUSATE SODIUM 100 MG PO CAPS Oral Take 100 mg by mouth as needed.      . FUROSEMIDE 20 MG PO TABS       . GABAPENTIN 300 MG PO CAPS Oral Take 300 mg by mouth 3 (three) times daily.      Marland Kitchen LOSARTAN POTASSIUM 25 MG PO TABS Oral Take 25 mg by mouth daily.      Marland Kitchen NITROGLYCERIN 0.4 MG SL SUBL Sublingual Place 0.4 mg under the tongue every 5 (five) minutes as needed.      Marland Kitchen POLYETHYLENE GLYCOL 3350 PO POWD Oral Take 17 g by mouth as needed.      Marland Kitchen PRAVASTATIN SODIUM 20 MG PO TABS Oral Take 60 mg by mouth at bedtime.      . SENNA-DOCUSATE SODIUM 8.6-50 MG PO TABS Oral Take 1 tablet by mouth as directed.      Marland Kitchen VITAMIN C 500 MG PO TABS Oral Take 500 mg by mouth daily.  BP 118/50  Pulse 72  Temp(Src) 98 F (36.7 C) (Oral)  Resp 18  Ht 5\' 11"  (1.803 m)  Wt 179 lb (81.194 kg)  BMI 24.97 kg/m2  SpO2 100%  Physical Exam  Nursing note and vitals reviewed. Constitutional: He is oriented to person, place, and time. He appears well-developed and well-nourished. No distress.  HENT:  Head: Atraumatic.  Mouth/Throat: Oropharynx is clear and moist.       1.5 cm laceration right eyebrow 1 cm avulsion right eyebrow  Eyes: Conjunctivae are normal. Pupils are equal, round, and reactive to light.  Neck: Neck supple.       No midline cervical tenderness to palpation  Cardiovascular: Normal rate, regular rhythm, normal heart sounds and intact distal pulses.  Exam reveals no gallop and no friction rub.   No murmur heard. Pulmonary/Chest: Effort normal. No respiratory distress. He has no wheezes. He has no rales.  Abdominal: Soft. Bowel sounds are normal. There is no tenderness. There is no rebound and no guarding.  Musculoskeletal: Normal range of motion. He exhibits no edema and no tenderness.       No midline  thoracic or lumbar tenderness to palpation  rt elbow with full range of motion without pain there is no bony tenderness to palpation  Neurological: He is alert and oriented to person, place, and time. No cranial nerve deficit. He exhibits normal muscle tone. Coordination normal.       Strength 5 out of 5 all extremities  Skin: Skin is warm and dry.       1 Centimeter abrasion right lateral elbow  Psychiatric: He has a normal mood and affect.    ED Course  LACERATION REPAIR Date/Time: 05/29/2011 2:45 PM Performed by: Forbes Cellar Authorized by: Forbes Cellar Consent: Verbal consent obtained. Risks and benefits: risks, benefits and alternatives were discussed Consent given by: patient Patient understanding: patient states understanding of the procedure being performed Patient consent: the patient's understanding of the procedure matches consent given Procedure consent: procedure consent matches procedure scheduled Relevant documents: relevant documents present and verified Patient identity confirmed: verbally with patient Body area: head/neck Location details: right eyebrow Laceration length: 1.5 cm Foreign bodies: no foreign bodies Tendon involvement: none Nerve involvement: none Anesthesia: local infiltration Local anesthetic: lidocaine 1% without epinephrine Anesthetic total: 2 ml Patient sedated: no Irrigation solution: saline Amount of cleaning: standard Skin closure: 6-0 nylon Technique: simple Approximation: close Approximation difficulty: simple Comments: Assisted by PA student Sherilyn Banker   (including critical care time)  Labs Reviewed - No data to display Dg Ribs Unilateral W/chest Right  05/29/2011  *RADIOLOGY REPORT*  Clinical Data: Fall landing on right side  RIGHT RIBS AND CHEST - 3+ VIEW  Comparison: Chest radiographs 05/05/2011  Findings: Upper-normal size of cardiac silhouette. Mediastinal contours and pulmonary vascularity normal. Lungs clear.  No pleural effusion or pneumothorax. Surgical clips in right cervical region and in right upper quadrant. Bones appear demineralized. BB placed at site of symptoms lower right chest. No rib fracture or bone destruction identified.  IMPRESSION: No acute abnormalities.  Original Report Authenticated By: Lollie Marrow, M.D.   Ct Head Wo Contrast  05/29/2011  *RADIOLOGY REPORT*  Clinical Data: Fall, headache, right frontal laceration  CT HEAD WITHOUT CONTRAST  Technique:  Contiguous axial images were obtained from the base of the skull through the vertex without contrast.  Comparison: 09/01/2007  Findings: Diffuse brain atrophy noted with periventricular microvascular chronic ischemic changes.  No acute intracranial hemorrhage, mass lesion,  infarction, midline shift, herniation, hydrocephalus, or extra-axial fluid collection.  Gray-white matter differentiation is maintained.  Cisterns patent.  Cerebellar atrophy as well.  Atherosclerosis noted of the intracranial vessels at the skull base.  Symmetric orbits.  Mastoids and sinuses clear. Small retention cyst or polyp laterally in the right maxillary sinus.  No depressed skull fracture.  Slight right frontal region soft tissue swelling of the scalp.  IMPRESSION: Stable atrophy and microvascular ischemic changes.  No acute intracranial process.  Original Report Authenticated By: Judie Petit. Ruel Favors, M.D.     1. Fall   2. Blunt head trauma   3. Laceration   4. Abrasion   5. Rib contusion       MDM   77 year old male with laceration after a mechanical fall. There is blunt head trauma but no CT evidence of skull fracture or intracranial hemorrhage. Right rib pain without fracture on x-ray. Laceration repaired. Wound care to abrasion. He will follow with his primary care provider in 3- 5 days for suture removal.  Stefano Gaul, MD        Forbes Cellar, MD 05/29/11 [id number]

## 2011-09-15 ENCOUNTER — Other Ambulatory Visit: Payer: Self-pay | Admitting: *Deleted

## 2011-09-15 DIAGNOSIS — I6529 Occlusion and stenosis of unspecified carotid artery: Secondary | ICD-10-CM

## 2011-09-18 ENCOUNTER — Encounter (INDEPENDENT_AMBULATORY_CARE_PROVIDER_SITE_OTHER): Payer: Medicare Other | Admitting: *Deleted

## 2011-09-18 DIAGNOSIS — I6529 Occlusion and stenosis of unspecified carotid artery: Secondary | ICD-10-CM

## 2011-09-20 ENCOUNTER — Ambulatory Visit: Payer: Medicare Other | Attending: Family Medicine | Admitting: Physical Therapy

## 2011-09-20 DIAGNOSIS — R279 Unspecified lack of coordination: Secondary | ICD-10-CM | POA: Insufficient documentation

## 2011-09-20 DIAGNOSIS — IMO0001 Reserved for inherently not codable concepts without codable children: Secondary | ICD-10-CM | POA: Insufficient documentation

## 2011-09-20 DIAGNOSIS — R269 Unspecified abnormalities of gait and mobility: Secondary | ICD-10-CM | POA: Insufficient documentation

## 2011-09-26 ENCOUNTER — Ambulatory Visit: Payer: Medicare Other | Admitting: Physical Therapy

## 2011-09-29 ENCOUNTER — Ambulatory Visit: Payer: Medicare Other | Admitting: Rehabilitative and Restorative Service Providers"

## 2011-10-03 ENCOUNTER — Ambulatory Visit: Payer: Medicare Other | Admitting: Rehabilitative and Restorative Service Providers"

## 2011-10-05 ENCOUNTER — Encounter: Payer: Medicare Other | Admitting: Physical Therapy

## 2011-10-05 ENCOUNTER — Encounter: Payer: Medicare Other | Admitting: Rehabilitative and Restorative Service Providers"

## 2011-10-10 ENCOUNTER — Ambulatory Visit: Payer: Medicare Other | Admitting: Physical Therapy

## 2011-10-12 ENCOUNTER — Ambulatory Visit: Payer: Medicare Other | Admitting: Physical Therapy

## 2011-10-17 ENCOUNTER — Ambulatory Visit: Payer: Medicare Other | Attending: Family Medicine | Admitting: Physical Therapy

## 2011-10-17 DIAGNOSIS — R42 Dizziness and giddiness: Secondary | ICD-10-CM | POA: Insufficient documentation

## 2011-10-17 DIAGNOSIS — R269 Unspecified abnormalities of gait and mobility: Secondary | ICD-10-CM | POA: Insufficient documentation

## 2011-10-17 DIAGNOSIS — IMO0001 Reserved for inherently not codable concepts without codable children: Secondary | ICD-10-CM | POA: Insufficient documentation

## 2011-10-19 ENCOUNTER — Ambulatory Visit: Payer: Medicare Other | Admitting: Physical Therapy

## 2011-10-24 ENCOUNTER — Ambulatory Visit: Payer: Medicare Other | Admitting: Physical Therapy

## 2011-10-26 ENCOUNTER — Ambulatory Visit: Payer: Medicare Other | Admitting: Physical Therapy

## 2011-10-31 ENCOUNTER — Ambulatory Visit: Payer: Medicare Other | Admitting: Physical Therapy

## 2011-11-02 ENCOUNTER — Ambulatory Visit: Payer: Medicare Other | Admitting: Physical Therapy

## 2011-11-07 ENCOUNTER — Ambulatory Visit: Payer: Medicare Other | Admitting: Physical Therapy

## 2011-11-09 ENCOUNTER — Ambulatory Visit: Payer: Medicare Other | Admitting: Physical Therapy

## 2011-11-14 ENCOUNTER — Ambulatory Visit: Payer: Medicare Other | Attending: Family Medicine | Admitting: Physical Therapy

## 2011-11-14 DIAGNOSIS — R269 Unspecified abnormalities of gait and mobility: Secondary | ICD-10-CM | POA: Insufficient documentation

## 2011-11-14 DIAGNOSIS — IMO0001 Reserved for inherently not codable concepts without codable children: Secondary | ICD-10-CM | POA: Insufficient documentation

## 2011-11-14 DIAGNOSIS — R42 Dizziness and giddiness: Secondary | ICD-10-CM | POA: Insufficient documentation

## 2011-11-16 ENCOUNTER — Encounter: Payer: Medicare Other | Admitting: Physical Therapy

## 2012-02-23 ENCOUNTER — Encounter (INDEPENDENT_AMBULATORY_CARE_PROVIDER_SITE_OTHER): Payer: Medicare Other | Admitting: Ophthalmology

## 2012-02-26 ENCOUNTER — Encounter (INDEPENDENT_AMBULATORY_CARE_PROVIDER_SITE_OTHER): Payer: Medicare Other | Admitting: Ophthalmology

## 2012-02-26 DIAGNOSIS — H43819 Vitreous degeneration, unspecified eye: Secondary | ICD-10-CM

## 2012-02-26 DIAGNOSIS — H3530 Unspecified macular degeneration: Secondary | ICD-10-CM

## 2012-02-26 DIAGNOSIS — H2510 Age-related nuclear cataract, unspecified eye: Secondary | ICD-10-CM

## 2012-02-26 DIAGNOSIS — H35379 Puckering of macula, unspecified eye: Secondary | ICD-10-CM

## 2012-02-27 ENCOUNTER — Encounter (INDEPENDENT_AMBULATORY_CARE_PROVIDER_SITE_OTHER): Payer: Medicare Other | Admitting: Ophthalmology

## 2012-02-27 DIAGNOSIS — H35379 Puckering of macula, unspecified eye: Secondary | ICD-10-CM | POA: Diagnosis present

## 2012-02-27 NOTE — H&P (Signed)
[name] is an 78 y.o. male.   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 Now has fibrotic membrane on macula  Past Medical History  Diagnosis Date  . Coronary artery disease     native vessel  . Carotid artery stenosis     without infarction  . Hypertension   . Chronic diastolic heart failure   . Edema   . Bronchitis, acute   . Mini stroke     20 yrs ago during cartodid surgery    Past Surgical History  Procedure Date  . Penile prosthesis implant   . Cholecystectomy   . Right carotid endarterectomy   . Back surgery   . Lumbar laminectomy 03/24/10    L3-4 and L4-5  . Ankle surgery     bone infection    Family History  Problem Relation Age of Onset  . Thalassemia      Family history   Social History:  reports that he quit smoking about 34 years ago. His smoking use included Cigarettes. He has never used smokeless tobacco. He reports that he drinks alcohol. He reports that he does not use illicit drugs.  Allergies:  Allergies  Allergen Reactions  . Amoxicillin     REACTION: unspecified  . Clopidogrel Bisulfate     REACTION: red blue, black blotches  . Dilaudid (Hydromorphone Hcl)   . Penicillins   . Sulfamethoxazole     REACTION: unspecified  . Sulfonamide Derivatives     No prescriptions prior to admission    Review of systems otherwise negative  There were no vitals taken for this visit.  Physical exam: Mental status: oriented x3. Eyes: See eye exam associated with this date of surgery in media tab.  Scanned in by scanning center Ears, Nose, Throat: within normal limits Neck: Within Normal limits General: within normal limits Chest: Within normal limits Breast: deferred Heart: Within normal limits Abdomen: Within normal limits GU: deferred Extremities: within normal limits Skin: within normal limits  Assessment/Plan Macular pucker, Pre retinal fibrosis Plan: To [facility] for Pars plana  vitrectomy, membrane peel, laser treatment, gas injection right eye  Sherrie George 02/27/2012, 7:37 AM

## 2012-03-03 ENCOUNTER — Other Ambulatory Visit: Payer: Self-pay | Admitting: Cardiology

## 2012-03-04 ENCOUNTER — Encounter (HOSPITAL_COMMUNITY): Payer: Self-pay | Admitting: Pharmacy Technician

## 2012-03-18 ENCOUNTER — Encounter (HOSPITAL_COMMUNITY): Payer: Self-pay | Admitting: *Deleted

## 2012-03-18 MED ORDER — PHENYLEPHRINE HCL 2.5 % OP SOLN
1.0000 [drp] | OPHTHALMIC | Status: AC | PRN
  Administered 2012-03-19 (×3): 1 [drp] via OPHTHALMIC
  Filled 2012-03-18: qty 3

## 2012-03-18 MED ORDER — MUPIROCIN 2 % EX OINT: TOPICAL_OINTMENT | Freq: Two times a day (BID) | CUTANEOUS | Status: DC

## 2012-03-18 MED ORDER — GATIFLOXACIN 0.5 % OP SOLN
1.0000 [drp] | OPHTHALMIC | Status: AC | PRN
  Administered 2012-03-19 (×3): 1 [drp] via OPHTHALMIC
  Filled 2012-03-18: qty 2.5

## 2012-03-18 MED ORDER — CYCLOPENTOLATE HCL 1 % OP SOLN
1.0000 [drp] | OPHTHALMIC | Status: AC | PRN
  Administered 2012-03-19 (×3): 1 [drp] via OPHTHALMIC
  Filled 2012-03-18: qty 2

## 2012-03-18 MED ORDER — TROPICAMIDE 1 % OP SOLN
1.0000 [drp] | OPHTHALMIC | Status: AC | PRN
  Administered 2012-03-19 (×3): 1 [drp] via OPHTHALMIC
  Filled 2012-03-18: qty 3

## 2012-03-18 MED ORDER — CLINDAMYCIN PHOSPHATE 600 MG/50ML IV SOLN
600.0000 mg | Freq: Once | INTRAVENOUS | Status: AC
  Administered 2012-03-19: 600 mg via INTRAVENOUS
  Filled 2012-03-18: qty 50

## 2012-03-18 NOTE — Progress Notes (Signed)
Pt stated that he took Aspirin on 03/17/12, "I forgot and took it."  Pt has not notified Dr Ashley Royalty, I instructed pt to notify Dr Ashley Royalty in AM and I out a note on the chart.

## 2012-03-19 ENCOUNTER — Ambulatory Visit (HOSPITAL_COMMUNITY): Payer: Medicare Other

## 2012-03-19 ENCOUNTER — Encounter (HOSPITAL_COMMUNITY): Payer: Self-pay | Admitting: Anesthesiology

## 2012-03-19 ENCOUNTER — Encounter (HOSPITAL_COMMUNITY): Payer: Self-pay

## 2012-03-19 ENCOUNTER — Ambulatory Visit (HOSPITAL_COMMUNITY): Payer: Medicare Other | Admitting: Anesthesiology

## 2012-03-19 ENCOUNTER — Ambulatory Visit (HOSPITAL_COMMUNITY)
Admission: RE | Admit: 2012-03-19 | Discharge: 2012-03-20 | Disposition: A | Discharge status: Home / Self Care | Payer: Medicare Other | Source: Ambulatory Visit | Attending: Ophthalmology | Admitting: Ophthalmology

## 2012-03-19 ENCOUNTER — Encounter (HOSPITAL_COMMUNITY)
Admission: RE | Disposition: A | Discharge status: Home / Self Care | Payer: Self-pay | Source: Ambulatory Visit | Attending: Ophthalmology

## 2012-03-19 DIAGNOSIS — I252 Old myocardial infarction: Secondary | ICD-10-CM | POA: Insufficient documentation

## 2012-03-19 DIAGNOSIS — H35379 Puckering of macula, unspecified eye: Secondary | ICD-10-CM

## 2012-03-19 DIAGNOSIS — G4730 Sleep apnea, unspecified: Secondary | ICD-10-CM | POA: Insufficient documentation

## 2012-03-19 DIAGNOSIS — I10 Essential (primary) hypertension: Secondary | ICD-10-CM | POA: Insufficient documentation

## 2012-03-19 DIAGNOSIS — J4489 Other specified chronic obstructive pulmonary disease: Secondary | ICD-10-CM | POA: Insufficient documentation

## 2012-03-19 DIAGNOSIS — I5032 Chronic diastolic (congestive) heart failure: Secondary | ICD-10-CM | POA: Insufficient documentation

## 2012-03-19 DIAGNOSIS — J449 Chronic obstructive pulmonary disease, unspecified: Secondary | ICD-10-CM | POA: Insufficient documentation

## 2012-03-19 DIAGNOSIS — I509 Heart failure, unspecified: Secondary | ICD-10-CM | POA: Insufficient documentation

## 2012-03-19 DIAGNOSIS — H43399 Other vitreous opacities, unspecified eye: Secondary | ICD-10-CM | POA: Insufficient documentation

## 2012-03-19 DIAGNOSIS — I2510 Atherosclerotic heart disease of native coronary artery without angina pectoris: Secondary | ICD-10-CM | POA: Insufficient documentation

## 2012-03-19 HISTORY — DX: Unspecified osteoarthritis, unspecified site: M19.90

## 2012-03-19 HISTORY — DX: Sleep apnea, unspecified: G47.30

## 2012-03-19 HISTORY — DX: Cerebral infarction, unspecified: I63.9

## 2012-03-19 HISTORY — DX: Acute myocardial infarction, unspecified: I21.9

## 2012-03-19 HISTORY — PX: PARS PLANA VITRECTOMY: SHX2166

## 2012-03-19 HISTORY — DX: Chronic obstructive pulmonary disease, unspecified: J44.9

## 2012-03-19 HISTORY — DX: Foot drop, right foot: M21.371

## 2012-03-19 LAB — BASIC METABOLIC PANEL
CO2: 32 mEq/L (ref 19–32)
Chloride: 102 mEq/L (ref 96–112)
Creatinine, Ser: 1.18 mg/dL (ref 0.50–1.35)
Sodium: 140 mEq/L (ref 135–145)

## 2012-03-19 LAB — CBC
HCT: 37.5 % — ABNORMAL LOW (ref 39.0–52.0)
Hemoglobin: 11.8 g/dL — ABNORMAL LOW (ref 13.0–17.0)
MCV: 64.1 fL — ABNORMAL LOW (ref 78.0–100.0)
RBC: 5.85 MIL/uL — ABNORMAL HIGH (ref 4.22–5.81)
WBC: 5.9 10*3/uL (ref 4.0–10.5)

## 2012-03-19 SURGERY — PARS PLANA VITRECTOMY WITH 25 GAUGE
Anesthesia: General | Site: Eye | Laterality: Right | Wound class: Clean

## 2012-03-19 MED ORDER — MAGNESIUM HYDROXIDE 400 MG/5ML PO SUSP: 15.0000 mL | Freq: Four times a day (QID) | ORAL | Status: DC | PRN

## 2012-03-19 MED ORDER — ATROPINE SULFATE 1 % OP SOLN
OPHTHALMIC | Status: AC
  Filled 2012-03-19: qty 2

## 2012-03-19 MED ORDER — TETRACAINE HCL 0.5 % OP SOLN
2.0000 [drp] | Freq: Once | OPHTHALMIC | Status: DC
  Filled 2012-03-19: qty 2

## 2012-03-19 MED ORDER — LOSARTAN POTASSIUM 25 MG PO TABS
25.0000 mg | ORAL_TABLET | Freq: Every day | ORAL | Status: DC
  Administered 2012-03-19: 25 mg via ORAL
  Filled 2012-03-19 (×2): qty 1

## 2012-03-19 MED ORDER — BACITRACIN-POLYMYXIN B 500-10000 UNIT/GM OP OINT
1.0000 "application " | TOPICAL_OINTMENT | Freq: Four times a day (QID) | OPHTHALMIC | Status: DC
  Filled 2012-03-19: qty 3.5

## 2012-03-19 MED ORDER — HYDROCODONE-ACETAMINOPHEN 5-325 MG PO TABS
ORAL_TABLET | ORAL | Status: AC
  Filled 2012-03-19: qty 2

## 2012-03-19 MED ORDER — BACITRACIN-POLYMYXIN B 500-10000 UNIT/GM OP OINT
TOPICAL_OINTMENT | OPHTHALMIC | Status: AC
  Filled 2012-03-19: qty 3.5

## 2012-03-19 MED ORDER — EPHEDRINE SULFATE 50 MG/ML IJ SOLN
INTRAMUSCULAR | Status: DC | PRN
  Administered 2012-03-19 (×5): 10 mg via INTRAVENOUS

## 2012-03-19 MED ORDER — PREDNISOLONE ACETATE 1 % OP SUSP
1.0000 [drp] | Freq: Four times a day (QID) | OPHTHALMIC | Status: DC
  Filled 2012-03-19: qty 5
  Filled 2012-03-19: qty 1

## 2012-03-19 MED ORDER — ONDANSETRON HCL 4 MG/2ML IJ SOLN: 4.0000 mg | Freq: Four times a day (QID) | INTRAMUSCULAR | Status: DC | PRN

## 2012-03-19 MED ORDER — FENTANYL CITRATE 0.05 MG/ML IJ SOLN
INTRAMUSCULAR | Status: DC | PRN
  Administered 2012-03-19: 100 ug via INTRAVENOUS

## 2012-03-19 MED ORDER — LIDOCAINE HCL 4 % MT SOLN
OROMUCOSAL | Status: DC | PRN
  Administered 2012-03-19: 4 mL via TOPICAL

## 2012-03-19 MED ORDER — BSS IO SOLN
INTRAOCULAR | Status: AC
  Filled 2012-03-19: qty 15

## 2012-03-19 MED ORDER — EPINEPHRINE HCL 1 MG/ML IJ SOLN
INTRAOCULAR | Status: DC | PRN
  Administered 2012-03-19: 12:00:00

## 2012-03-19 MED ORDER — ROCURONIUM BROMIDE 100 MG/10ML IV SOLN
INTRAVENOUS | Status: DC | PRN
  Administered 2012-03-19: 50 mg via INTRAVENOUS

## 2012-03-19 MED ORDER — BUPIVACAINE HCL 0.75 % IJ SOLN
INTRAMUSCULAR | Status: AC
  Filled 2012-03-19: qty 10

## 2012-03-19 MED ORDER — MUPIROCIN 2 % EX OINT
TOPICAL_OINTMENT | Freq: Two times a day (BID) | CUTANEOUS | Status: DC
  Administered 2012-03-19 (×2): via NASAL
  Filled 2012-03-19: qty 22

## 2012-03-19 MED ORDER — FUROSEMIDE 20 MG PO TABS
20.0000 mg | ORAL_TABLET | Freq: Every day | ORAL | Status: DC
  Administered 2012-03-19: 20 mg via ORAL
  Filled 2012-03-19 (×2): qty 1

## 2012-03-19 MED ORDER — DEXAMETHASONE SODIUM PHOSPHATE 10 MG/ML IJ SOLN
INTRAMUSCULAR | Status: AC
  Filled 2012-03-19: qty 1

## 2012-03-19 MED ORDER — SENNOSIDES-DOCUSATE SODIUM 8.6-50 MG PO TABS: 1.0000 | ORAL_TABLET | Freq: Every day | ORAL | Status: DC | PRN

## 2012-03-19 MED ORDER — GEMFIBROZIL 600 MG PO TABS
600.0000 mg | ORAL_TABLET | Freq: Two times a day (BID) | ORAL | Status: DC
  Administered 2012-03-19: 600 mg via ORAL
  Filled 2012-03-19 (×4): qty 1

## 2012-03-19 MED ORDER — ACETAMINOPHEN 325 MG PO TABS: 325.0000 mg | ORAL_TABLET | ORAL | Status: DC | PRN

## 2012-03-19 MED ORDER — MINERAL OIL LIGHT 100 % EX OIL
TOPICAL_OIL | CUTANEOUS | Status: AC
  Filled 2012-03-19: qty 25

## 2012-03-19 MED ORDER — DEXAMETHASONE SODIUM PHOSPHATE 10 MG/ML IJ SOLN
INTRAMUSCULAR | Status: DC | PRN
  Administered 2012-03-19: 10 mg

## 2012-03-19 MED ORDER — MUPIROCIN 2 % EX OINT
TOPICAL_OINTMENT | CUTANEOUS | Status: AC
  Administered 2012-03-19: 1 via NASAL
  Filled 2012-03-19: qty 22

## 2012-03-19 MED ORDER — POLYETHYLENE GLYCOL 3350 17 GM/SCOOP PO POWD
17.0000 g | Freq: Every day | ORAL | Status: DC | PRN
  Filled 2012-03-19: qty 255

## 2012-03-19 MED ORDER — HYDROCODONE-ACETAMINOPHEN 5-325 MG PO TABS
1.0000 | ORAL_TABLET | ORAL | Status: DC | PRN
  Administered 2012-03-19 – 2012-03-20 (×2): 2 via ORAL
  Filled 2012-03-19: qty 2

## 2012-03-19 MED ORDER — FUROSEMIDE 20 MG PO TABS
20.0000 mg | ORAL_TABLET | Freq: Two times a day (BID) | ORAL | Status: DC
  Filled 2012-03-19 (×4): qty 1

## 2012-03-19 MED ORDER — 0.9 % SODIUM CHLORIDE (POUR BTL) OPTIME
TOPICAL | Status: DC | PRN
  Administered 2012-03-19: 1000 mL

## 2012-03-19 MED ORDER — DOCUSATE SODIUM 100 MG PO CAPS
100.0000 mg | ORAL_CAPSULE | Freq: Every day | ORAL | Status: DC | PRN
  Filled 2012-03-19 (×2): qty 1

## 2012-03-19 MED ORDER — BSS PLUS IO SOLN
INTRAOCULAR | Status: AC
  Filled 2012-03-19: qty 500

## 2012-03-19 MED ORDER — BRIMONIDINE TARTRATE 0.2 % OP SOLN
1.0000 [drp] | Freq: Two times a day (BID) | OPHTHALMIC | Status: DC
  Filled 2012-03-19: qty 5

## 2012-03-19 MED ORDER — SODIUM CHLORIDE 0.9 % IV SOLN
INTRAVENOUS | Status: DC | PRN
  Administered 2012-03-19: 11:00:00 via INTRAVENOUS

## 2012-03-19 MED ORDER — PHENYLEPHRINE HCL 10 MG/ML IJ SOLN
INTRAMUSCULAR | Status: DC | PRN
  Administered 2012-03-19 (×4): 40 ug via INTRAVENOUS

## 2012-03-19 MED ORDER — LIDOCAINE HCL 1 % IJ SOLN
INTRAMUSCULAR | Status: DC | PRN
  Administered 2012-03-19: 100 mg via INTRADERMAL

## 2012-03-19 MED ORDER — BUPIVACAINE HCL 0.75 % IJ SOLN
INTRAMUSCULAR | Status: DC | PRN
  Administered 2012-03-19: 10 mL

## 2012-03-19 MED ORDER — SODIUM CHLORIDE 0.9 % IJ SOLN
INTRAMUSCULAR | Status: DC | PRN
  Administered 2012-03-19: 12:00:00

## 2012-03-19 MED ORDER — BACITRACIN-POLYMYXIN B 500-10000 UNIT/GM OP OINT
TOPICAL_OINTMENT | OPHTHALMIC | Status: DC | PRN
  Administered 2012-03-19: 1 via OPHTHALMIC

## 2012-03-19 MED ORDER — HYPROMELLOSE (GONIOSCOPIC) 2.5 % OP SOLN
OPHTHALMIC | Status: AC
  Filled 2012-03-19: qty 15

## 2012-03-19 MED ORDER — PROVISC 10 MG/ML IO SOLN
INTRAOCULAR | Status: DC | PRN
  Administered 2012-03-19: 8.5 mg via INTRAOCULAR

## 2012-03-19 MED ORDER — ONDANSETRON HCL 4 MG/2ML IJ SOLN: 4.0000 mg | Freq: Once | INTRAMUSCULAR | Status: DC | PRN

## 2012-03-19 MED ORDER — LIDOCAINE HCL 2 % IJ SOLN
INTRAMUSCULAR | Status: AC
  Filled 2012-03-19: qty 1

## 2012-03-19 MED ORDER — ACETAZOLAMIDE SODIUM 500 MG IJ SOLR
500.0000 mg | Freq: Once | INTRAMUSCULAR | Status: AC
  Administered 2012-03-20: 500 mg via INTRAVENOUS
  Filled 2012-03-19: qty 500

## 2012-03-19 MED ORDER — GLYCOPYRROLATE 0.2 MG/ML IJ SOLN
INTRAMUSCULAR | Status: DC | PRN
  Administered 2012-03-19: .6 mg via INTRAVENOUS

## 2012-03-19 MED ORDER — ONDANSETRON HCL 4 MG/2ML IJ SOLN
INTRAMUSCULAR | Status: DC | PRN
  Administered 2012-03-19: 4 mg via INTRAVENOUS

## 2012-03-19 MED ORDER — TEMAZEPAM 15 MG PO CAPS: 15.0000 mg | ORAL_CAPSULE | Freq: Every evening | ORAL | Status: DC | PRN

## 2012-03-19 MED ORDER — MORPHINE SULFATE 2 MG/ML IJ SOLN: 1.0000 mg | INTRAMUSCULAR | Status: DC | PRN

## 2012-03-19 MED ORDER — GABAPENTIN 300 MG PO CAPS
600.0000 mg | ORAL_CAPSULE | Freq: Three times a day (TID) | ORAL | Status: DC
  Administered 2012-03-19 (×2): 600 mg via ORAL
  Filled 2012-03-19 (×5): qty 2

## 2012-03-19 MED ORDER — MIRABEGRON ER 50 MG PO TB24
50.0000 mg | ORAL_TABLET | Freq: Every day | ORAL | Status: DC
  Administered 2012-03-19: 50 mg via ORAL
  Filled 2012-03-19 (×2): qty 1

## 2012-03-19 MED ORDER — BSS IO SOLN
INTRAOCULAR | Status: DC | PRN
  Administered 2012-03-19: 15 mL via INTRAOCULAR

## 2012-03-19 MED ORDER — NITROGLYCERIN 0.4 MG SL SUBL: 0.4000 mg | SUBLINGUAL_TABLET | SUBLINGUAL | Status: DC | PRN

## 2012-03-19 MED ORDER — DOCUSATE SODIUM 100 MG PO CAPS
100.0000 mg | ORAL_CAPSULE | Freq: Two times a day (BID) | ORAL | Status: DC
  Administered 2012-03-19 (×2): 100 mg via ORAL

## 2012-03-19 MED ORDER — POLYMYXIN B SULFATE 500000 UNITS IJ SOLR
INTRAMUSCULAR | Status: AC
  Filled 2012-03-19: qty 1

## 2012-03-19 MED ORDER — GATIFLOXACIN 0.5 % OP SOLN
1.0000 [drp] | Freq: Four times a day (QID) | OPHTHALMIC | Status: DC
  Filled 2012-03-19: qty 2.5

## 2012-03-19 MED ORDER — SODIUM CHLORIDE 0.45 % IV SOLN
INTRAVENOUS | Status: DC
  Administered 2012-03-19: 20 mL/h via INTRAVENOUS

## 2012-03-19 MED ORDER — NEOSTIGMINE METHYLSULFATE 1 MG/ML IJ SOLN
INTRAMUSCULAR | Status: DC | PRN
  Administered 2012-03-19: 5 mg via INTRAVENOUS

## 2012-03-19 MED ORDER — GENTAMICIN SULFATE 40 MG/ML IJ SOLN
INTRAMUSCULAR | Status: AC
  Filled 2012-03-19: qty 2

## 2012-03-19 MED ORDER — LATANOPROST 0.005 % OP SOLN
1.0000 [drp] | Freq: Every day | OPHTHALMIC | Status: DC
  Filled 2012-03-19: qty 2.5

## 2012-03-19 MED ORDER — ACETAMINOPHEN ER 650 MG PO TBCR: 650.0000 mg | EXTENDED_RELEASE_TABLET | Freq: Three times a day (TID) | ORAL | Status: DC | PRN

## 2012-03-19 MED ORDER — VITAMIN C 500 MG PO TABS
500.0000 mg | ORAL_TABLET | Freq: Every day | ORAL | Status: DC
  Administered 2012-03-19: 500 mg via ORAL
  Filled 2012-03-19 (×2): qty 1

## 2012-03-19 MED ORDER — PROPOFOL 10 MG/ML IV EMUL
INTRAVENOUS | Status: DC | PRN
  Administered 2012-03-19: 100 mg via INTRAVENOUS

## 2012-03-19 MED ORDER — SODIUM HYALURONATE 10 MG/ML IO SOLN
INTRAOCULAR | Status: AC
  Filled 2012-03-19: qty 0.85

## 2012-03-19 MED ORDER — EPINEPHRINE HCL 1 MG/ML IJ SOLN
INTRAMUSCULAR | Status: AC
  Filled 2012-03-19: qty 1

## 2012-03-19 SURGICAL SUPPLY — 55 items
CLOTH BEACON ORANGE TIMEOUT ST (SAFETY) ×2 IMPLANT
COTTONBALL LRG STERILE PKG (GAUZE/BANDAGES/DRESSINGS) ×6 IMPLANT
DRAPE INCISE 51X51 W/FILM STRL (DRAPES) ×2 IMPLANT
DRAPE OPHTHALMIC 77X100 STRL (CUSTOM PROCEDURE TRAY) ×2 IMPLANT
FILTER STRAW FLUID ASPIR (MISCELLANEOUS) IMPLANT
GLOVE SS BIOGEL STRL SZ 6.5 (GLOVE) ×1 IMPLANT
GLOVE SS BIOGEL STRL SZ 7 (GLOVE) ×1 IMPLANT
GLOVE SUPERSENSE BIOGEL SZ 6.5 (GLOVE) ×1
GLOVE SUPERSENSE BIOGEL SZ 7 (GLOVE) ×1
GLOVE SURG 8.5 LATEX PF (GLOVE) ×2 IMPLANT
GOWN STRL NON-REIN LRG LVL3 (GOWN DISPOSABLE) ×6 IMPLANT
ILLUMINATOR CHOW PICK 25GA (MISCELLANEOUS) ×2 IMPLANT
KIT BASIN OR (CUSTOM PROCEDURE TRAY) ×2 IMPLANT
KIT ROOM TURNOVER OR (KITS) ×2 IMPLANT
LENS BIOM SUPER VIEW SET DISP (OPHTHALMIC RELATED) IMPLANT
MARKER SKIN DUAL TIP RULER LAB (MISCELLANEOUS) IMPLANT
MASK EYE SHIELD (GAUZE/BANDAGES/DRESSINGS) ×2 IMPLANT
MICROPICK 25G (MISCELLANEOUS)
NEEDLE 18GX1X1/2 (RX/OR ONLY) (NEEDLE) ×2 IMPLANT
NEEDLE 25GX 5/8IN NON SAFETY (NEEDLE) ×2 IMPLANT
NEEDLE 27GAX1X1/2 (NEEDLE) IMPLANT
NEEDLE FILTER BLUNT 18X 1/2SAF (NEEDLE) ×1
NEEDLE FILTER BLUNT 18X1 1/2 (NEEDLE) ×1 IMPLANT
NEEDLE HYPO 30X.5 LL (NEEDLE) ×2 IMPLANT
NS IRRIG 1000ML POUR BTL (IV SOLUTION) ×2 IMPLANT
PACK VITRECTOMY CUSTOM (CUSTOM PROCEDURE TRAY) ×2 IMPLANT
PAD ARMBOARD 7.5X6 YLW CONV (MISCELLANEOUS) ×4 IMPLANT
PAD EYE OVAL STERILE LF (GAUZE/BANDAGES/DRESSINGS) ×2 IMPLANT
PAK VITRECTOMY PIK 25 GA (OPHTHALMIC RELATED) ×2 IMPLANT
PENCIL BIPOLAR 25GA STR DISP (OPHTHALMIC RELATED) IMPLANT
PICK MICROPICK 25G (MISCELLANEOUS) IMPLANT
PROBE DIRECTIONAL LASER (MISCELLANEOUS) IMPLANT
REPL STRA BRUSH NEEDLE (NEEDLE) IMPLANT
RESERVOIR BACK FLUSH (MISCELLANEOUS) IMPLANT
ROLLS DENTAL (MISCELLANEOUS) ×4 IMPLANT
SCRAPER DIAMOND 25GA (OPHTHALMIC RELATED) ×2 IMPLANT
SCRAPER DIAMOND DUST MEMBRANE (MISCELLANEOUS) IMPLANT
SPONGE SURGIFOAM ABS GEL 12-7 (HEMOSTASIS) ×2 IMPLANT
STOPCOCK 4 WAY LG BORE MALE ST (IV SETS) IMPLANT
SUT CHROMIC 7 0 TG140 8 (SUTURE) IMPLANT
SUT ETHILON 10 0 CS140 6 (SUTURE) IMPLANT
SUT ETHILON 9 0 TG140 8 (SUTURE) IMPLANT
SUT POLY NON ABSORB 10-0 8 STR (SUTURE) IMPLANT
SUT SILK 4 0 RB 1 (SUTURE) IMPLANT
SYR 20CC LL (SYRINGE) ×2 IMPLANT
SYR 5ML LL (SYRINGE) IMPLANT
SYR BULB 3OZ (MISCELLANEOUS) ×2 IMPLANT
SYR TB 1ML LUER SLIP (SYRINGE) ×2 IMPLANT
SYRINGE 10CC LL (SYRINGE) IMPLANT
TAPE SURG TRANSPORE 1 IN (GAUZE/BANDAGES/DRESSINGS) ×1 IMPLANT
TAPE SURGICAL TRANSPORE 1 IN (GAUZE/BANDAGES/DRESSINGS) ×1
TOWEL OR 17X24 6PK STRL BLUE (TOWEL DISPOSABLE) ×6 IMPLANT
TROCAR CANNULA 25GA (CANNULA) IMPLANT
WATER STERILE IRR 1000ML POUR (IV SOLUTION) ×2 IMPLANT
WIPE INSTRUMENT VISIWIPE 73X73 (MISCELLANEOUS) ×2 IMPLANT

## 2012-03-19 NOTE — Anesthesia Procedure Notes (Signed)
Procedure Name: Intubation Date/Time: 03/19/2012 11:56 AM Performed by: Leona Singleton A Pre-anesthesia Checklist: Patient identified Patient Re-evaluated:Patient Re-evaluated prior to inductionOxygen Delivery Method: Circle system utilized Preoxygenation: Pre-oxygenation with 100% oxygen Intubation Type: IV induction Ventilation: Mask ventilation without difficulty Laryngoscope Size: Miller and 3 Grade View: Grade I Tube type: Oral Tube size: 7.5 mm Number of attempts: 1 Airway Equipment and Method: Stylet and LTA kit utilized Placement Confirmation: ETT inserted through vocal cords under direct vision,  positive ETCO2 and breath sounds checked- equal and bilateral Secured at: 21 cm Tube secured with: Tape Dental Injury: Teeth and Oropharynx as per pre-operative assessment

## 2012-03-19 NOTE — Brief Op Note (Signed)
03/19/2012  12:42 PM  PATIENT:  [name]  78 y.o. male  PRE-OPERATIVE DIAGNOSIS:  Pre retinal fibrosis right eye  POST-OPERATIVE DIAGNOSIS:  Pre retinal fibrosis right eye  PROCEDURE:  Procedure(s) (LRB): PARS PLANA VITRECTOMY WITH 25 GAUGE (Right)  SURGEON:  Surgeon(s) and Role:    * Sherrie George, MD - Primary  Brief Operative note   Preoperative diagnosis:  Pre-Op Diagnosis Codes:    * Macular puckering of retina [362.56] Postoperative diagnosis  Post-Op Diagnosis Codes:    * Macular puckering of retina [362.56]    Surgeon:  Sherrie George, MD...  Assistant:  Rosalie Doctor SA   Anesthesia: General  Specimen: none  Estimated blood loss:  1cc  Complications: none  Patient sent to PACU in good condition  Composed by Sherrie George MD  Dictation number: [phone]

## 2012-03-19 NOTE — Anesthesia Postprocedure Evaluation (Signed)
Anesthesia Post Note  Patient: [name]  Procedure(s) Performed: Procedure(s) (LRB): PARS PLANA VITRECTOMY WITH 25 GAUGE (Right)  Anesthesia type: general  Patient location: PACU  Post pain: Pain level controlled  Post assessment: Patient's Cardiovascular Status Stable  Last Vitals:  Filed Vitals:   03/19/12 1445  BP: 117/51  Pulse: 66  Temp:   Resp: 15    Post vital signs: Reviewed and stable  Level of consciousness: sedated  Complications: No apparent anesthesia complications

## 2012-03-19 NOTE — H&P (Signed)
I examined the patient today and there is no change in the medical status 

## 2012-03-19 NOTE — Transfer of Care (Signed)
Immediate Anesthesia Transfer of Care Note  Patient: [name]  Procedure(s) Performed: Procedure(s) (LRB): PARS PLANA VITRECTOMY WITH 25 GAUGE (Right)  Patient Location: PACU  Anesthesia Type: General  Level of Consciousness: sedated, unresponsive and Patient remains intubated per anesthesia plan  Airway & Oxygen Therapy: Patient remains intubated per anesthesia plan and Patient placed on Ventilator (see vital sign flow sheet for setting)  Post-op Assessment: Report given to PACU RN and Post -op Vital signs reviewed and stable  Post vital signs: Reviewed and stable  Complications: Prolong paralysis

## 2012-03-19 NOTE — Anesthesia Preprocedure Evaluation (Addendum)
Anesthesia Evaluation  Patient identified by MRN, date of birth, ID band Patient awake    Reviewed: Allergy & Precautions, H&P , NPO status , Patient's Chart, lab work & pertinent test results  History of Anesthesia Complications Negative for: history of anesthetic complications  Airway Mallampati: II TM Distance: >3 FB Neck ROM: Full    Dental  (+) Edentulous Upper, Teeth Intact and Dental Advisory Given   Pulmonary shortness of breath, sleep apnea and Continuous Positive Airway Pressure Ventilation , COPDformer smoker,          Cardiovascular hypertension, Pt. on medications + CAD, + Past MI (1979) and +CHF  Echo 12/18/2008 Study Conclusions   1. Left ventricle: Very poor acoustic window limits study. LVEF is     depressed at approximately 25 to30% with akinesis the the     inferior wall, inferoseptal wall, apex, distal lateral,     mid/distal anterior; hypokinesis/akinesis inferiorly. The cavity     size was normal. Wall thickness was normal.  2. Mitral valve: Mildly to moderately calcified annulus. Mildly     thickened leaflets . Mild regurgitation.    Neuro/Psych S/p right CEA  Neuromuscular disease (peripheral neuropathy BLE) CVA, No Residual Symptoms negative psych ROS   GI/Hepatic negative GI ROS, Neg liver ROS,   Endo/Other  negative endocrine ROS  Renal/GU negative Renal ROS     Musculoskeletal  (+) Arthritis -, Osteoarthritis,    Abdominal (+) + obese,   Peds  Hematology negative hematology ROS (+)   Anesthesia Other Findings   Reproductive/Obstetrics                         Anesthesia Physical Anesthesia Plan  ASA: III  Anesthesia Plan: General   Post-op Pain Management:    Induction: Intravenous  Airway Management Planned: Oral ETT  Additional Equipment:   Intra-op Plan:   Post-operative Plan: Extubation in OR  Informed Consent: I have reviewed the patients History  and Physical, chart, labs and discussed the procedure including the risks, benefits and alternatives for the proposed anesthesia with the patient or authorized representative who has indicated his/her understanding and acceptance.     Plan Discussed with: CRNA and Surgeon  Anesthesia Plan Comments:         Anesthesia Quick Evaluation

## 2012-03-19 NOTE — Preoperative (Signed)
Beta Blockers   Reason not to administer Beta Blockers:Not Applicable 

## 2012-03-20 ENCOUNTER — Encounter (HOSPITAL_COMMUNITY): Payer: Self-pay | Admitting: Ophthalmology

## 2012-03-20 MED ORDER — PREDNISOLONE ACETATE 1 % OP SUSP: 1.0000 [drp] | Freq: Four times a day (QID) | OPHTHALMIC | Status: AC

## 2012-03-20 MED ORDER — BACITRACIN-POLYMYXIN B 500-10000 UNIT/GM OP OINT: 1.0000 "application " | TOPICAL_OINTMENT | Freq: Four times a day (QID) | OPHTHALMIC | Status: AC

## 2012-03-20 MED ORDER — GATIFLOXACIN 0.5 % OP SOLN: 1.0000 [drp] | Freq: Four times a day (QID) | OPHTHALMIC | Status: DC

## 2012-03-20 NOTE — Op Note (Signed)
NAMEBENJIMEN, KELLEY NO.:  [id number]  MEDICAL RECORD NO.:  [id number]  LOCATION:  [id number]                        FACILITY:  [facility]  PHYSICIAN:  Beulah Gandy. Ashley Royalty, M.D. DATE OF BIRTH:  Jan 27, 1924  DATE OF PROCEDURE:  03/19/2012 DATE OF DISCHARGE:                              OPERATIVE REPORT   ADMISSION DIAGNOSIS:  Preretinal fibrosis and vitreous opacities, right eye.  PROCEDURES:  Pars plana vitrectomy with retinal photocoagulation membrane peel, gas injection, right eye.  SURGEON:  Beulah Gandy. Ashley Royalty, MD  ASSISTANT:  Rosalie Doctor, SA  ANESTHESIA:  General.  DETAILS:  Usual prep and drape, indirect ophthalmoscope laser was moved into place, 787 burns placed around the retinal periphery and weak areas of the retina.  The power was 360 mW, 1000 microns each and 0.1 seconds each.  A 25-gauge trocars were placed at 8, 10, and 2 with infusion at 8 o'clock.  The Provisc placed on the corneal surface, and the BIOM viewing system was moved into place.  Pars plana vitrectomy was begun just behind the pseudophacos.  Large clumps of flow white vitreous were encountered.  These were carefully removed under low suction and rapid cutting.  The vitrectomy was carried in a core fashion down to the macular surface.  The mid peripheral vitreous was then removed with the vitreous cutter in wide-field viewing.  The vitrectomy was carried out into the far periphery down to the vitreous base where additional vitreous strands were removed.  Attention was then carried to the macular region.  The flat contact lens was placed into the contact lens ring.  Provisc was placed on the corneal surface.  The diamond-dusted membrane scraper was used to engage the surface retinal membrane, it was apparent that there was macular degeneration with large drusen and macular pigment in the central fovea.  Once the membrane was removed, the vitreous cutter was repositioned in the eye, and  additional removal of vitreous debris was carried out.  A 30% gas-fluid exchange was then performed.  The instruments were removed from the eye and the trocars were removed from the eye.  The wounds were tested and found to be tight.  Polymyxin and gentamicin were irrigated into tenon space. Marcaine was injected around the globe for postop pain.  Decadron 10 mg was injected into the lower subconjunctival space.  Closing pressure was 10 with a Barraquer tonometer.  COMPLICATIONS:  None.  DURATION:  45 minutes.  The patient was awakened, taken to the recovery in satisfactory condition.     Beulah Gandy. Ashley Royalty, M.D.     JDM/MEDQ  D:  03/19/2012  T:  03/20/2012  Job:  [id number]

## 2012-03-20 NOTE — Progress Notes (Signed)
03/20/2012, 6:24 AM  Mental Status:  Awake, Alert, Oriented  Anterior segment: Cornea  Clear    Anterior Chamber Clear    Lens:    IOL  Intra Ocular Pressure 18 mmHg with Tonopen  Vitreous: Clear 15%gas bubble   Retina:  Attached Good laser reaction   Impression: Excellent result Retina attached   Final Diagnosis: Principal Problem:  *Macular pucker   Plan: start post operative eye drops.  Discharge to home.  Give post operative instructions  Sherrie George 03/20/2012, 6:24 AM

## 2012-03-20 NOTE — Progress Notes (Signed)
DC home with son. Verbally understands DC instructions. No questions asked.

## 2012-03-21 NOTE — Discharge Summary (Signed)
Discharge summary not needed on [facility] patients per medical records. 

## 2012-03-26 ENCOUNTER — Inpatient Hospital Stay (INDEPENDENT_AMBULATORY_CARE_PROVIDER_SITE_OTHER): Payer: Medicare Other | Admitting: Ophthalmology

## 2012-03-26 DIAGNOSIS — H35379 Puckering of macula, unspecified eye: Secondary | ICD-10-CM

## 2012-04-17 ENCOUNTER — Encounter (INDEPENDENT_AMBULATORY_CARE_PROVIDER_SITE_OTHER): Payer: Medicare Other | Admitting: Ophthalmology

## 2012-04-17 DIAGNOSIS — H35379 Puckering of macula, unspecified eye: Secondary | ICD-10-CM

## 2012-04-28 ENCOUNTER — Other Ambulatory Visit: Payer: Self-pay | Admitting: Cardiology

## 2012-05-15 ENCOUNTER — Encounter: Payer: Self-pay | Admitting: Cardiology

## 2012-05-15 ENCOUNTER — Ambulatory Visit (INDEPENDENT_AMBULATORY_CARE_PROVIDER_SITE_OTHER): Payer: Medicare Other | Admitting: Cardiology

## 2012-05-15 VITALS — BP 116/62 | HR 66 | Ht 71.0 in | Wt 182.0 lb

## 2012-05-15 DIAGNOSIS — I6529 Occlusion and stenosis of unspecified carotid artery: Secondary | ICD-10-CM

## 2012-05-15 DIAGNOSIS — R609 Edema, unspecified: Secondary | ICD-10-CM

## 2012-05-15 DIAGNOSIS — I2510 Atherosclerotic heart disease of native coronary artery without angina pectoris: Secondary | ICD-10-CM

## 2012-05-15 DIAGNOSIS — I5032 Chronic diastolic (congestive) heart failure: Secondary | ICD-10-CM

## 2012-05-15 MED ORDER — FUROSEMIDE 40 MG PO TABS: 40.0000 mg | ORAL_TABLET | Freq: Every day | ORAL | Status: DC

## 2012-05-15 MED ORDER — NITROGLYCERIN 0.4 MG SL SUBL: 0.4000 mg | SUBLINGUAL_TABLET | SUBLINGUAL | Status: DC | PRN

## 2012-05-15 NOTE — Progress Notes (Signed)
HPI [name] turns today for evaluation and management of his history of chronic diastolic heart failure, coronary artery disease with history of myocardial infarction of the anterior [name] with full systolic recovery, nonobstructive coronary disease, carotid artery disease stable by carotid ultrasound earlier this year, and hypertension.  He denies any angina or chest discomfort. He is out of his nitroglycerin has expired. He has noted some mild orthopnea. He's had some increased lower extremity edema. He takes furosemide 20 mg a day. He does not weigh himself daily. He is compliant with his medications. Has been under a lot of stress recently with his wife's being ill.  Past Medical History  Diagnosis Date  . Carotid artery stenosis     without infarction  . Hypertension   . Chronic diastolic heart failure   . Edema   . Bronchitis, acute   . Mini stroke     20 yrs ago during cartodid surgery  . CHF (congestive heart failure)   . Myocardial infarction 1979    Dr [name] - Cardiologist  . Coronary artery disease     native vessel  . Shortness of breath     with exersition  . COPD (chronic obstructive pulmonary disease)     "mild"  . Sleep apnea     CPAP doesn't use know.  last tested > 5 years abd < 10..  . Stroke     "mild stroke Years ago"  . Cancer     prostate  . Arthritis   . Foot drop, right   . Neuromuscular disorder     neuropathy- LE    Current Outpatient Prescriptions  Medication Sig Dispense Refill  . acetaminophen (TYLENOL) 650 MG CR tablet Take 650 mg by mouth every 8 (eight) hours as needed. For pain      . aspirin 325 MG EC tablet Take 325 mg by mouth daily.      . budesonide-formoterol (SYMBICORT) 160-4.5 MCG/ACT inhaler Inhale 2 puffs into the lungs 2 (two) times daily.      Marland Kitchen docusate sodium (COLACE) 100 MG capsule Take 100 mg by mouth daily as needed. For constipation      . furosemide (LASIX) 40 MG tablet Take 1 tablet (40 mg total) by mouth daily.  95 tablet   3  . gabapentin (NEURONTIN) 300 MG capsule Take 600 mg by mouth 3 (three) times daily.       Marland Kitchen gatifloxacin (ZYMAXID) 0.5 % SOLN Place 1 drop into the right eye 4 (four) times daily.      Marland Kitchen gemfibrozil (LOPID) 600 MG tablet Take 600 mg by mouth 2 (two) times daily before a meal.      . losartan (COZAAR) 25 MG tablet Take 25 mg by mouth daily.        . mirabegron ER (MYRBETRIQ) 50 MG TB24 Take 50 mg by mouth daily.      . nitroGLYCERIN (NITROSTAT) 0.4 MG SL tablet Place 0.4 mg under the tongue every 5 (five) minutes as needed. For chest pain      . polyethylene glycol powder (MIRALAX) powder Take 17 g by mouth daily as needed. For constipation      . sennosides-docusate sodium (SENOKOT-S) 8.6-50 MG tablet Take 1 tablet by mouth daily as needed. For constipation      . vitamin C (ASCORBIC ACID) 500 MG tablet Take 500 mg by mouth daily.        Marland Kitchen DISCONTD: furosemide (LASIX) 20 MG tablet Take 20 mg by mouth daily.       Marland Kitchen  DISCONTD: furosemide (LASIX) 20 MG tablet TAKE 1 TABLET BY MOUTH EVERY DAY  30 tablet  1    Allergies  Allergen Reactions  . Amoxicillin     REACTION: unspecified  . Clopidogrel Bisulfate     REACTION: red blue, black blotches  . Dilaudid (Hydromorphone Hcl)   . Penicillins   . Sulfa Antibiotics   . Sulfamethoxazole     REACTION: unspecified  . Sulfonamide Derivatives     Family History  Problem Relation Age of Onset  . Thalassemia      Family history    History   Social History  . Marital Status: Married    Spouse Name: N/A    Number of Children: N/A  . Years of Education: N/A   Occupational History  . Not on file.   Social History Main Topics  . Smoking status: Former Smoker    Types: Cigarettes    Quit date: 08/14/1977  . Smokeless tobacco: Never Used  . Alcohol Use: No     occasional  . Drug Use: No  . Sexually Active: Not on file   Other Topics Concern  . Not on file   Social History Narrative  . No narrative on file    ROS ALL NEGATIVE  EXCEPT THOSE NOTED IN HPI  PE  General Appearance: well developed, well nourished in no acute distress HEENT: symmetrical face, PERRLA, good dentition  Neck: no JVD, thyromegaly, or adenopathy, trachea midline Chest: symmetric without deformity Cardiac: PMI non-displaced, RRR, normal S1, S2, no gallop or murmur Lung: clear to ausculation and percussion Vascular: all pulses full without bruits  Abdominal: nondistended, nontender, good bowel sounds, no HSM, no bruits Extremities: no cyanosis, clubbing  1-2+ pitting edema no sign of DVT, no varicosities  Skin: normal color, no rashes Neuro: alert and oriented x 3, non-focal Pysch: normal affect  EKG Normal sinus rhythm, old anterior septal MI, no change BMET    Component Value Date/Time   NA 140 03/19/2012 0936   K 4.5 03/19/2012 0936   CL 102 03/19/2012 0936   CO2 32 03/19/2012 0936   GLUCOSE 136* 03/19/2012 0936   BUN 23 03/19/2012 0936   CREATININE 1.18 03/19/2012 0936   CREATININE 1.10 04/24/2011 1200   CALCIUM 9.3 03/19/2012 0936   GFRNONAA 54* 03/19/2012 0936   GFRAA 63* 03/19/2012 0936    Lipid Panel     Component Value Date/Time   CHOL 175 04/30/2011 0525   TRIG 79 04/30/2011 0525   HDL 47 04/30/2011 0525   CHOLHDL 3.7 04/30/2011 0525   VLDL 16 04/30/2011 0525   LDLCALC 112* 04/30/2011 0525    CBC    Component Value Date/Time   WBC 5.9 03/19/2012 0936   WBC 5.7 04/21/2008 1131   RBC 5.85* 03/19/2012 0936   RBC 5.02 04/21/2008 1131   HGB 11.8* 03/19/2012 0936   HGB 10.6* 04/21/2008 1131   HCT 37.5* 03/19/2012 0936   HCT 33.6* 04/21/2008 1131   PLT 146* 03/19/2012 0936   PLT 167 04/21/2008 1131   MCV 64.1* 03/19/2012 0936   MCV 67.0* 04/21/2008 1131   MCH 20.2* 03/19/2012 0936   MCH 21.2* 04/21/2008 1131   MCHC 31.5 03/19/2012 0936   MCHC 31.7* 04/21/2008 1131   RDW 14.9 03/19/2012 0936   RDW 15.3* 04/21/2008 1131   LYMPHSABS 1.0 04/29/2011 2325   LYMPHSABS 2.0 04/21/2008 1131   MONOABS 0.8 04/29/2011 2325   MONOABS 0.4 04/21/2008 1131   EOSABS 0.1 04/29/2011  2325  EOSABS 0.3 04/21/2008 1131   BASOSABS 0.0 04/29/2011 2325   BASOSABS 0.0 04/21/2008 1131

## 2012-05-15 NOTE — Assessment & Plan Note (Signed)
Stable. Repeat carotid Dopplers next spring.

## 2012-05-15 NOTE — Assessment & Plan Note (Signed)
Stable. Continue secondary preventative therapy. Nitroglycerin renewed. 

## 2012-05-15 NOTE — Patient Instructions (Addendum)
Increase your furosemide (lasix) to 40 mg a day  Your physician recommends that you weigh, daily, at the same time every day, and in the same amount of clothing. Please record your daily weights on the handout provided and bring it to your next appointment.  If your weight goes up 3 pounds overnight you may take an extra furosemide that afternoon.  Your physician recommends that you return for lab work in: Wednesday October 9.2013 for a BMP   We will call you with your lab results.  our physician recommends that you schedule a follow-up appointment in: 3 months with Dr. [name]   Follow a potassium rich diet . Also follow a diet low in salt (sodium)   Food / Potassium (mg)  Apricots, dried,  cup / 378 mg   Apricots, raw, 1 cup halves / 401 mg   Avocado,  / 487 mg   Banana, 1 large / 487 mg   Beef, lean, round, 3 oz / 202 mg   Cantaloupe, 1 cup cubes / 427 mg   Dates, medjool, 5 whole / 835 mg   Ham, cured, 3 oz / 212 mg   Lentils, dried,  cup / 458 mg   Lima beans, frozen,  cup / 258 mg   Orange, 1 large / 333 mg   Orange juice, 1 cup / 443 mg   Peaches, dried,  cup / 398 mg   Peas, split, cooked,  cup / 355 mg   Potato, boiled, 1 medium / 515 mg   Prunes, dried, uncooked,  cup / 318 mg   Raisins,  cup / 309 mg   Salmon, pink, raw, 3 oz / 275 mg   Sardines, canned , 3 oz / 338 mg   Tomato, raw, 1 medium / 292 mg   Tomato juice, 6 oz / 417 mg   [place], 3 oz / 349 mg  Document Released: 07/31/2005 Document Revised: 04/12/2011 Document Reviewed: 12/14/2008 [facility] Patient Information 2012 [place], [place].

## 2012-05-15 NOTE — Assessment & Plan Note (Signed)
I suspect he is 5 pounds up from baseline. He has significant edema and has symptoms of orthopnea. I've asked him to weigh himself daily and on over the heart fair protocol with self titration of Lasix. We'll increase his Lasix today to 40 mg every morning. We'll have him return in 10 days for a metabolic profile. I'll see him back in 3 months.

## 2012-05-22 ENCOUNTER — Other Ambulatory Visit (INDEPENDENT_AMBULATORY_CARE_PROVIDER_SITE_OTHER): Payer: Medicare Other

## 2012-05-22 DIAGNOSIS — R609 Edema, unspecified: Secondary | ICD-10-CM

## 2012-05-22 LAB — BASIC METABOLIC PANEL
Calcium: 9.5 mg/dL (ref 8.4–10.5)
GFR: 58.15 mL/min — ABNORMAL LOW (ref >60.00)
Glucose, Bld: 163 mg/dL — ABNORMAL HIGH (ref 70–99)
Potassium: 4.3 mEq/L (ref 3.5–5.1)
Sodium: 143 mEq/L (ref 135–145)

## 2012-06-26 ENCOUNTER — Encounter (INDEPENDENT_AMBULATORY_CARE_PROVIDER_SITE_OTHER): Payer: Medicare Other | Admitting: Ophthalmology

## 2012-06-26 DIAGNOSIS — H35379 Puckering of macula, unspecified eye: Secondary | ICD-10-CM

## 2012-06-26 DIAGNOSIS — H35039 Hypertensive retinopathy, unspecified eye: Secondary | ICD-10-CM

## 2012-06-26 DIAGNOSIS — H2510 Age-related nuclear cataract, unspecified eye: Secondary | ICD-10-CM

## 2012-06-26 DIAGNOSIS — H3530 Unspecified macular degeneration: Secondary | ICD-10-CM

## 2012-06-26 DIAGNOSIS — I10 Essential (primary) hypertension: Secondary | ICD-10-CM

## 2012-06-26 DIAGNOSIS — H43819 Vitreous degeneration, unspecified eye: Secondary | ICD-10-CM

## 2012-07-17 ENCOUNTER — Ambulatory Visit: Payer: Medicare Other | Admitting: Cardiology

## 2012-08-30 ENCOUNTER — Encounter: Payer: Self-pay | Admitting: Cardiology

## 2012-09-17 ENCOUNTER — Ambulatory Visit: Payer: Medicare Other | Admitting: Cardiology

## 2012-11-08 ENCOUNTER — Telehealth: Payer: Self-pay | Admitting: *Deleted

## 2012-11-08 NOTE — Telephone Encounter (Signed)
Pt received message & will have carotids prior to appt. With Dr. [name] on Monday Mylo Red RN

## 2012-11-08 NOTE — Telephone Encounter (Signed)
LMOVM carotids scheduled for Monday at 10:00am  Mylo Red RN

## 2012-11-11 ENCOUNTER — Ambulatory Visit (INDEPENDENT_AMBULATORY_CARE_PROVIDER_SITE_OTHER): Payer: Medicare Other | Admitting: Cardiology

## 2012-11-11 ENCOUNTER — Encounter (INDEPENDENT_AMBULATORY_CARE_PROVIDER_SITE_OTHER): Payer: Medicare Other

## 2012-11-11 ENCOUNTER — Encounter: Payer: Self-pay | Admitting: Cardiology

## 2012-11-11 DIAGNOSIS — I5032 Chronic diastolic (congestive) heart failure: Secondary | ICD-10-CM

## 2012-11-11 DIAGNOSIS — I2510 Atherosclerotic heart disease of native coronary artery without angina pectoris: Secondary | ICD-10-CM

## 2012-11-11 DIAGNOSIS — I6529 Occlusion and stenosis of unspecified carotid artery: Secondary | ICD-10-CM

## 2012-11-11 DIAGNOSIS — R42 Dizziness and giddiness: Secondary | ICD-10-CM

## 2012-11-11 DIAGNOSIS — I10 Essential (primary) hypertension: Secondary | ICD-10-CM

## 2012-11-11 MED ORDER — FUROSEMIDE 20 MG PO TABS: 20.0000 mg | ORAL_TABLET | Freq: Every day | ORAL | Status: DC

## 2012-11-11 NOTE — Assessment & Plan Note (Signed)
Stable. No change in medical therapy. 

## 2012-11-11 NOTE — Assessment & Plan Note (Signed)
Stable. Continue secondary preventative therapy. 

## 2012-11-11 NOTE — Assessment & Plan Note (Signed)
Slight progression on the left 260-79% range. Minimal plaque and surgical changes on the right. Repeat scan in one year. Continue secondary preventative therapy.

## 2012-11-11 NOTE — Assessment & Plan Note (Signed)
Good control. No change in medical therapy. 

## 2012-11-11 NOTE — Progress Notes (Signed)
HPI [name] returns today for evaluation and management of his coronary artery disease, chronic diastolic heart failure, and carotid artery disease.  He's having no angina or chest discomfort. He denies any symptoms of TIAs or mini strokes. His mobility has diminished. He has a fair amount of stress at home with his wife is suffering from dementia.  Past Medical History  Diagnosis Date  . Carotid artery stenosis     without infarction  . Hypertension   . Chronic diastolic heart failure   . Edema   . Bronchitis, acute   . Mini stroke     20 yrs ago during cartodid surgery  . CHF (congestive heart failure)   . Myocardial infarction 1979    Dr [name] - Cardiologist  . Coronary artery disease     native vessel  . Shortness of breath     with exersition  . COPD (chronic obstructive pulmonary disease)     "mild"  . Sleep apnea     CPAP doesn't use know.  last tested > 5 years abd < 10..  . Stroke     "mild stroke Years ago"  . Cancer     prostate  . Arthritis   . Foot drop, right   . Neuromuscular disorder     neuropathy- LE    Current Outpatient Prescriptions  Medication Sig Dispense Refill  . acetaminophen (TYLENOL) 650 MG CR tablet Take 650 mg by mouth every 8 (eight) hours as needed. For pain      . aspirin 325 MG EC tablet Take 325 mg by mouth daily.      . budesonide-formoterol (SYMBICORT) 160-4.5 MCG/ACT inhaler Inhale 2 puffs into the lungs 2 (two) times daily.      Marland Kitchen docusate sodium (COLACE) 100 MG capsule Take 100 mg by mouth daily as needed. For constipation      . furosemide (LASIX) 20 MG tablet Take 1 tablet (20 mg total) by mouth daily.  90 tablet  3  . gabapentin (NEURONTIN) 300 MG capsule Take 600 mg by mouth 3 (three) times daily.       Marland Kitchen gemfibrozil (LOPID) 600 MG tablet Take 600 mg by mouth 2 (two) times daily before a meal.      . losartan (COZAAR) 25 MG tablet Take 25 mg by mouth daily.        . mirabegron ER (MYRBETRIQ) 50 MG TB24 Take 50 mg by mouth daily.       . nitroGLYCERIN (NITROSTAT) 0.4 MG SL tablet Place 1 tablet (0.4 mg total) under the tongue every 5 (five) minutes as needed. For chest pain  25 tablet  11  . polyethylene glycol powder (MIRALAX) powder Take 17 g by mouth daily as needed. For constipation      . sennosides-docusate sodium (SENOKOT-S) 8.6-50 MG tablet Take 1 tablet by mouth daily as needed. For constipation      . vitamin C (ASCORBIC ACID) 500 MG tablet Take 500 mg by mouth daily.         No current facility-administered medications for this visit.    Allergies  Allergen Reactions  . Amoxicillin     REACTION: unspecified  . Clopidogrel Bisulfate     REACTION: red blue, black blotches  . Dilaudid (Hydromorphone Hcl)   . Penicillins   . Sulfa Antibiotics   . Sulfamethoxazole     REACTION: unspecified  . Sulfonamide Derivatives     Family History  Problem Relation Age of Onset  . Thalassemia  Family history    History   Social History  . Marital Status: Married    Spouse Name: N/A    Number of Children: N/A  . Years of Education: N/A   Occupational History  . Not on file.   Social History Main Topics  . Smoking status: Former Smoker    Types: Cigarettes    Quit date: 08/14/1977  . Smokeless tobacco: Never Used  . Alcohol Use: No     Comment: occasional  . Drug Use: No  . Sexually Active: Not on file   Other Topics Concern  . Not on file   Social History Narrative  . No narrative on file    ROS ALL NEGATIVE EXCEPT THOSE NOTED IN HPI  PE  General Appearance: well developed, well nourished in no acute distress, elderly HEENT: symmetrical face, PERRLA, good dentition  Neck: no JVD, thyromegaly, or adenopathy, trachea midline Chest: symmetric without deformity Cardiac: PMI non-displaced, RRR, normal S1, S2, no gallop , systolic murmur left lower sternal border Lung: clear to ausculation and percussion Vascular: Pulses diminished in the lower chin these, left carotid bruit Abdominal:  nondistended, nontender, good bowel sounds, no HSM, no bruits Extremities: no cyanosis, clubbing or edema, no sign of DVT, no varicosities  Skin: normal color, no rashes Neuro: alert and oriented x 3, non-focal Pysch: normal affect  EKG Not repeated  BMET    Component Value Date/Time   NA 143 05/22/2012 1008   K 4.3 05/22/2012 1008   CL 102 05/22/2012 1008   CO2 30 05/22/2012 1008   GLUCOSE 163* 05/22/2012 1008   BUN 28* 05/22/2012 1008   CREATININE 1.3 05/22/2012 1008   CREATININE 1.10 04/24/2011 1200   CALCIUM 9.5 05/22/2012 1008   GFRNONAA 54* 03/19/2012 0936   GFRAA 63* 03/19/2012 0936    Lipid Panel     Component Value Date/Time   CHOL 175 04/30/2011 0525   TRIG 79 04/30/2011 0525   HDL 47 04/30/2011 0525   CHOLHDL 3.7 04/30/2011 0525   VLDL 16 04/30/2011 0525   LDLCALC 112* 04/30/2011 0525    CBC    Component Value Date/Time   WBC 5.9 03/19/2012 0936   WBC 5.7 04/21/2008 1131   RBC 5.85* 03/19/2012 0936   RBC 5.02 04/21/2008 1131   HGB 11.8* 03/19/2012 0936   HGB 10.6* 04/21/2008 1131   HCT 37.5* 03/19/2012 0936   HCT 33.6* 04/21/2008 1131   PLT 146* 03/19/2012 0936   PLT 167 04/21/2008 1131   MCV 64.1* 03/19/2012 0936   MCV 67.0* 04/21/2008 1131   MCH 20.2* 03/19/2012 0936   MCH 21.2* 04/21/2008 1131   MCHC 31.5 03/19/2012 0936   MCHC 31.7* 04/21/2008 1131   RDW 14.9 03/19/2012 0936   RDW 15.3* 04/21/2008 1131   LYMPHSABS 1.0 04/29/2011 2325   LYMPHSABS 2.0 04/21/2008 1131   MONOABS 0.8 04/29/2011 2325   MONOABS 0.4 04/21/2008 1131   EOSABS 0.1 04/29/2011 2325   EOSABS 0.3 04/21/2008 1131   BASOSABS 0.0 04/29/2011 2325   BASOSABS 0.0 04/21/2008 1131

## 2012-11-11 NOTE — Patient Instructions (Addendum)
**Note De-Identified Kessie Croston Obfuscation** Your physician has recommended you make the following change in your medication: decrease Furosemide (Lasix) to 20 mg daily  Your physician wants you to follow-up in: 1 year. You will receive a reminder letter in the mail two months in advance. If you don't receive a letter, please call our office to schedule the follow-up appointment.  Please call office at [phone] to report weight gain or swelling.

## 2012-11-14 ENCOUNTER — Ambulatory Visit: Payer: Medicare Other | Attending: Family Medicine | Admitting: Physical Therapy

## 2012-11-14 DIAGNOSIS — R269 Unspecified abnormalities of gait and mobility: Secondary | ICD-10-CM | POA: Insufficient documentation

## 2012-11-14 DIAGNOSIS — IMO0001 Reserved for inherently not codable concepts without codable children: Secondary | ICD-10-CM | POA: Insufficient documentation

## 2012-11-22 ENCOUNTER — Ambulatory Visit: Payer: Medicare Other | Admitting: Physical Therapy

## 2012-11-26 ENCOUNTER — Ambulatory Visit: Payer: Medicare Other | Admitting: Physical Therapy

## 2012-12-03 ENCOUNTER — Encounter: Payer: Medicare Other | Admitting: Physical Therapy

## 2012-12-05 ENCOUNTER — Encounter: Payer: Medicare Other | Admitting: Physical Therapy

## 2012-12-10 ENCOUNTER — Ambulatory Visit: Payer: Medicare Other | Admitting: Occupational Therapy

## 2012-12-24 ENCOUNTER — Ambulatory Visit (INDEPENDENT_AMBULATORY_CARE_PROVIDER_SITE_OTHER): Payer: Medicare Other | Admitting: Ophthalmology

## 2012-12-24 DIAGNOSIS — H34219 Partial retinal artery occlusion, unspecified eye: Secondary | ICD-10-CM

## 2012-12-24 DIAGNOSIS — H35039 Hypertensive retinopathy, unspecified eye: Secondary | ICD-10-CM

## 2012-12-24 DIAGNOSIS — H3530 Unspecified macular degeneration: Secondary | ICD-10-CM

## 2012-12-24 DIAGNOSIS — H35379 Puckering of macula, unspecified eye: Secondary | ICD-10-CM

## 2012-12-24 DIAGNOSIS — I10 Essential (primary) hypertension: Secondary | ICD-10-CM

## 2013-01-13 ENCOUNTER — Encounter: Payer: Self-pay | Admitting: Vascular Surgery

## 2013-01-14 ENCOUNTER — Ambulatory Visit (INDEPENDENT_AMBULATORY_CARE_PROVIDER_SITE_OTHER): Payer: Medicare Other | Admitting: Vascular Surgery

## 2013-01-14 ENCOUNTER — Other Ambulatory Visit: Payer: Self-pay | Admitting: *Deleted

## 2013-01-14 ENCOUNTER — Encounter: Payer: Self-pay | Admitting: Vascular Surgery

## 2013-01-14 DIAGNOSIS — I6529 Occlusion and stenosis of unspecified carotid artery: Secondary | ICD-10-CM

## 2013-01-14 DIAGNOSIS — Z48812 Encounter for surgical aftercare following surgery on the circulatory system: Secondary | ICD-10-CM

## 2013-01-14 NOTE — Progress Notes (Signed)
[facility] [place]   Patient name: [name] MRN: [id number] DOB: Mar 14, 1924 Sex: male   Referred by: Ashley Royalty  Reason for referral:  Chief Complaint  Patient presents with  . New Evaluation    referred by Alan Mulder MD   plaque left eye    carotid duplex 11-11-2012 Adolph Pollack    HISTORY OF PRESENT ILLNESS: Presents today for evaluation of extracranial cerebrovascular occlusive disease. He is known to me from a prior right carotid endarterectomy a greater than 15 years ago. He has had no focal neurologic deficits. He is left-handed. He has had some visual difficulties with cataract and macular degeneration. He recently saw Dr. Ashley Royalty and on ocular examination Dr. Maceo Pro: Hildred Laser plaques in his left eye. The patient specifically denies any episodes such as amaurosis fugax. He has had no focal weakness.  Past Medical History  Diagnosis Date  . Carotid artery stenosis     without infarction  . Hypertension   . Chronic diastolic heart failure   . Edema   . Bronchitis, acute   . Mini stroke     20 yrs ago during cartodid surgery  . CHF (congestive heart failure)   . Myocardial infarction 1979    Dr [name] - Cardiologist  . Coronary artery disease     native vessel  . Shortness of breath     with exersition  . COPD (chronic obstructive pulmonary disease)     "mild"  . Sleep apnea     CPAP doesn't use know.  last tested > 5 years abd < 10..  . Stroke     "mild stroke Years ago"  . Cancer     prostate  . Arthritis   . Foot drop, right   . Neuromuscular disorder     neuropathy- LE    Past Surgical History  Procedure Laterality Date  . Penile prosthesis implant    . Cholecystectomy    . Right carotid endarterectomy    . Back surgery    . Lumbar laminectomy  03/24/10    L3-4 and L4-5  . Ankle surgery      bone infection  . Cardiac catheterization    . Carotid endarterectomy      right  . Appendectomy    . Joint replacement      Left  .  Radioactive seed implant    . Pars plana vitrectomy  03/19/2012    Procedure: PARS PLANA VITRECTOMY WITH 25 GAUGE;  Surgeon: Sherrie George, MD;  Location: [facility] OR;  Service: Ophthalmology;  Laterality: Right;    History   Social History  . Marital Status: Married    Spouse Name: N/A    Number of Children: N/A  . Years of Education: N/A   Occupational History  . Not on file.   Social History Main Topics  . Smoking status: Former Smoker    Types: Cigarettes    Quit date: 08/14/1977  . Smokeless tobacco: Never Used  . Alcohol Use: No     Comment: occasional  . Drug Use: No  . Sexually Active: Not on file   Other Topics Concern  . Not on file   Social History Narrative  . No narrative on file    Family History  Problem Relation Age of Onset  . Thalassemia      Family history    Allergies as of 01/14/2013 - Review Complete 01/14/2013  Allergen Reaction Noted  . Amoxicillin  06/13/2007  .  Clopidogrel bisulfate  12/29/2008  . Dilaudid (hydromorphone hcl)  11/06/2008  . Penicillins  11/06/2008  . Sulfa antibiotics  03/04/2012  . Sulfamethoxazole  06/13/2007  . Sulfonamide derivatives  11/06/2008    Current Outpatient Prescriptions on File Prior to Visit  Medication Sig Dispense Refill  . acetaminophen (TYLENOL) 650 MG CR tablet Take 650 mg by mouth every 8 (eight) hours as needed. For pain      . aspirin 325 MG EC tablet Take 325 mg by mouth daily.      . budesonide-formoterol (SYMBICORT) 160-4.5 MCG/ACT inhaler Inhale 2 puffs into the lungs 2 (two) times daily.      . furosemide (LASIX) 20 MG tablet Take 1 tablet (20 mg total) by mouth daily.  90 tablet  3  . gabapentin (NEURONTIN) 300 MG capsule Take 600 mg by mouth 3 (three) times daily.       Marland Kitchen gemfibrozil (LOPID) 600 MG tablet Take 600 mg by mouth 2 (two) times daily before a meal.      . losartan (COZAAR) 25 MG tablet Take 25 mg by mouth daily.        . mirabegron ER (MYRBETRIQ) 50 MG TB24 Take 50 mg by mouth  daily.      . nitroGLYCERIN (NITROSTAT) 0.4 MG SL tablet Place 1 tablet (0.4 mg total) under the tongue every 5 (five) minutes as needed. For chest pain  25 tablet  11  . polyethylene glycol powder (MIRALAX) powder Take 17 g by mouth daily as needed. For constipation      . sennosides-docusate sodium (SENOKOT-S) 8.6-50 MG tablet Take 1 tablet by mouth daily as needed. For constipation      . vitamin C (ASCORBIC ACID) 500 MG tablet Take 500 mg by mouth daily.        Marland Kitchen docusate sodium (COLACE) 100 MG capsule Take 100 mg by mouth daily as needed. For constipation       No current facility-administered medications on file prior to visit.     REVIEW OF SYSTEMS:  Positives indicated with an "X"  CARDIOVASCULAR:  [ ]  chest pain   [ ]  chest pressure   [ ]  palpitations   [ ]  orthopnea   [x ] dyspnea on exertion   [ ]  claudication   [x ] rest pain   [ ]  DVT   [ ]  phlebitis PULMONARY:   [ ]  productive cough   [ ]  asthma   [ ]  wheezing NEUROLOGIC:   [ x] weakness  [ x] paresthesias  [ ]  aphasia  [ ]  amaurosis  [x ] dizziness HEMATOLOGIC:   [ ]  bleeding problems   [ ]  clotting disorders MUSCULOSKELETAL:  [ ]  joint pain   [ ]  joint swelling GASTROINTESTINAL: [ ]   blood in stool  [ ]   hematemesis GENITOURINARY:  [ ]   dysuria  [ ]   hematuria PSYCHIATRIC:  [ ]  history of major depression INTEGUMENTARY:  [ ]  rashes  [ ]  ulcers CONSTITUTIONAL:  [ ]  fever   [ ]  chills  PHYSICAL EXAMINATION:  General: The patient is a well-nourished male, in no acute distress. Vital signs are BP 133/52  Pulse 79  Resp 16  Ht 6' (1.829 m)  Wt 182 lb 1.6 oz (82.6 kg)  BMI 24.69 kg/m2 Pulmonary: There is a good air exchange bilaterally without wheezing or rales. Abdomen: Soft and non-tender with normal pitch bowel sounds. Musculoskeletal: There are no major deformities.  There is no significant extremity pain. Neurologic: No  focal weakness or paresthesias are detected, Skin: There are no ulcer or rashes  noted. Psychiatric: The patient has normal affect. Cardiovascular: There is a regular rate and rhythm without significant murmur appreciated. Carotid arteries without bruits bilaterally. Well-healed right carotid incision   Vascular Lab Studies:   Reviewed this was from outlying lab revealing 60-79% stenosis on the left carotid and no stenosis in the right carotid.  Impression and Plan:  Moderate left internal carotid artery stenosis with left eye Hollenhorst plaques. History of right carotid endarterectomy with a widely patent carotid endarterectomy site by duplex. At long discussion with the patient and his wife present. I did explain that this is a relative indication for endarterectomy with moderate disease in: Plaque. He really hasn't had no progressive changes on serial followup with his carotid. I have suggested that he begin baby aspirin. He had stop aspirin therapy because he was having some bruising with 325 aspirin. I again described symptoms of carotid disease with him in am comfortable with the close duplex followup. We will see him again in 6 months for further discussion he was notified immediately should he have any neurologic changes    Annalyssa Thune [facility] [place] Office: [phone]

## 2013-07-02 ENCOUNTER — Ambulatory Visit (INDEPENDENT_AMBULATORY_CARE_PROVIDER_SITE_OTHER): Payer: Medicare Other | Admitting: Ophthalmology

## 2013-07-02 DIAGNOSIS — H2510 Age-related nuclear cataract, unspecified eye: Secondary | ICD-10-CM

## 2013-07-02 DIAGNOSIS — H43819 Vitreous degeneration, unspecified eye: Secondary | ICD-10-CM

## 2013-07-02 DIAGNOSIS — H35039 Hypertensive retinopathy, unspecified eye: Secondary | ICD-10-CM

## 2013-07-02 DIAGNOSIS — H34219 Partial retinal artery occlusion, unspecified eye: Secondary | ICD-10-CM

## 2013-07-02 DIAGNOSIS — I10 Essential (primary) hypertension: Secondary | ICD-10-CM

## 2013-07-02 DIAGNOSIS — H3530 Unspecified macular degeneration: Secondary | ICD-10-CM

## 2013-07-21 ENCOUNTER — Encounter: Payer: Self-pay | Admitting: Family

## 2013-07-22 ENCOUNTER — Ambulatory Visit (HOSPITAL_COMMUNITY)
Admission: RE | Admit: 2013-07-22 | Discharge: 2013-07-22 | Disposition: A | Discharge status: Home / Self Care | Payer: Medicare Other | Source: Ambulatory Visit | Attending: Family | Admitting: Family

## 2013-07-22 ENCOUNTER — Ambulatory Visit (INDEPENDENT_AMBULATORY_CARE_PROVIDER_SITE_OTHER): Payer: Medicare Other | Admitting: Family

## 2013-07-22 ENCOUNTER — Other Ambulatory Visit (HOSPITAL_COMMUNITY): Payer: Medicare Other

## 2013-07-22 ENCOUNTER — Encounter: Payer: Self-pay | Admitting: Family

## 2013-07-22 DIAGNOSIS — I6529 Occlusion and stenosis of unspecified carotid artery: Secondary | ICD-10-CM | POA: Insufficient documentation

## 2013-07-22 DIAGNOSIS — Z48812 Encounter for surgical aftercare following surgery on the circulatory system: Secondary | ICD-10-CM

## 2013-07-22 DIAGNOSIS — I658 Occlusion and stenosis of other precerebral arteries: Secondary | ICD-10-CM | POA: Insufficient documentation

## 2013-07-22 NOTE — Addendum Note (Signed)
Addended by: Sharee Pimple on: 07/22/2013 02:25 PM   Modules accepted: Orders

## 2013-07-22 NOTE — Progress Notes (Signed)
Established Carotid Patient  History of Present Illness  [name] is a 79 y.o. male patient that Dr. Arbie Cookey saw in March, 2014 for referral from patient's opthalmologist for plaque seen in his right eye.  Dr. Arbie Cookey had performed a right carotid endarterectomy about 15 years prior. The patient had no focal neurologic deficits at that time. He is left-handed.  On a carotid Duplex performed in an outside facility prior to Dr. Arbie Cookey seeing patient in March of 2014, moderate left internal carotid artery stenosis was noted with widely patent carotid endarterectomy site.   Dr. Arbie Cookey explained to patient and wife in March that this is a relative indication for endarterectomy with moderate disease. Dr. Arbie Cookey suggested daily 81 mg ASA  Since he was having some bruising with 325 aspirin.  6 months carotid Duplex surveillance was suggested should he have any neurologic changes to notify us immediately.  Patient denies history of any stroke or TIA but was told that he had a stroke prior to the right CEA. He has chronic dizziness for 2-3 years, states his PCP is aware and has had evaluation of this and therapy for this which did not seem to help. He has fallen several times from the dizziness and hit his head.  The patient denies amaurosis fugax or monocular blindness.  The patient  denies facial drooping.  Pt. denies hemiplegia.  The patient denies receptive or expressive aphasia.  Pt. denies extremity weakness. He denies steal symptoms in either arm.  Patient  reports New Medical or Surgical History: that he had cataract extraction in right eye and will have left cataract extraction soon, also has macular degeneration. States he has slowly progressive  Neuropathy in LE's that started about 15 years ago, before his back problems. States he was exposed to extreme cold in the [employer] in [place].  He also has right foot drop which he states is related to his low back issues, wears a brace on his right  foot for this. He had an MI in 1979 and has been attending cardiac rehab. Regularly since then.  Pt Diabetic: Yes, "borderline" Pt smoker: former smoker, quit 1979  Pt meds include: Statin : No ASA: Yes Other anticoagulants/antiplatelets: no   Past Medical History  Diagnosis Date  . Carotid artery stenosis     without infarction  . Hypertension   . Chronic diastolic heart failure   . Edema   . Bronchitis, acute   . Mini stroke     20 yrs ago during cartodid surgery  . CHF (congestive heart failure)   . Myocardial infarction 1979    Dr [name] - Cardiologist  . Coronary artery disease     native vessel  . Shortness of breath     with exersition  . COPD (chronic obstructive pulmonary disease)     "mild"  . Sleep apnea     CPAP doesn't use know.  last tested > 5 years abd < 10..  . Stroke     "mild stroke Years ago"  . Cancer     prostate  . Arthritis   . Foot drop, right   . Neuromuscular disorder     neuropathy- LE  . Neuropathy     Social History History  Substance Use Topics  . Smoking status: Former Smoker    Types: Cigarettes    Quit date: 08/14/1977  . Smokeless tobacco: Never Used  . Alcohol Use: No     Comment: occasional    Family History  Family History  Problem Relation Age of Onset  . Thalassemia      Family history  . Cancer Mother 34    bone cancer  . Diabetes Mother   . Heart disease Mother   . Heart attack Mother   . Stroke Father 65  . Hypertension Sister   . Heart disease Brother   . Heart attack Brother 45    Surgical History Past Surgical History  Procedure Laterality Date  . Penile prosthesis implant    . Cholecystectomy    . Right carotid endarterectomy    . Back surgery    . Lumbar laminectomy  03/24/10    L3-4 and L4-5  . Ankle surgery      bone infection  . Cardiac catheterization    . Carotid endarterectomy      right  . Appendectomy    . Joint replacement      Left  . Radioactive seed implant    . Pars plana  vitrectomy  03/19/2012    Procedure: PARS PLANA VITRECTOMY WITH 25 GAUGE;  Surgeon: Sherrie George, MD;  Location: [facility] OR;  Service: Ophthalmology;  Laterality: Right;    Allergies  Allergen Reactions  . Amoxicillin     REACTION: unspecified  . Clopidogrel Bisulfate     REACTION: red blue, black blotches  . Dilaudid [Hydromorphone Hcl]   . Penicillins   . Sulfa Antibiotics   . Sulfamethoxazole     REACTION: unspecified  . Sulfonamide Derivatives     Current Outpatient Prescriptions  Medication Sig Dispense Refill  . acetaminophen (TYLENOL) 650 MG CR tablet Take 650 mg by mouth every 8 (eight) hours as needed. For pain      . aspirin 81 MG tablet Take 81 mg by mouth daily.      . budesonide-formoterol (SYMBICORT) 160-4.5 MCG/ACT inhaler Inhale 2 puffs into the lungs 2 (two) times daily.      Marland Kitchen docusate sodium (COLACE) 100 MG capsule Take 100 mg by mouth daily as needed. For constipation      . furosemide (LASIX) 20 MG tablet Take 1 tablet (20 mg total) by mouth daily.  90 tablet  3  . gabapentin (NEURONTIN) 300 MG capsule Take 600 mg by mouth 3 (three) times daily.       Marland Kitchen gemfibrozil (LOPID) 600 MG tablet Take 600 mg by mouth 2 (two) times daily before a meal.      . losartan (COZAAR) 25 MG tablet Take 25 mg by mouth daily.        . mirabegron ER (MYRBETRIQ) 50 MG TB24 Take 50 mg by mouth daily.      . Multiple Vitamins-Minerals (EYE VITAMINS) CAPS Take 1 capsule by mouth daily.      . nitroGLYCERIN (NITROSTAT) 0.4 MG SL tablet Place 1 tablet (0.4 mg total) under the tongue every 5 (five) minutes as needed. For chest pain  25 tablet  11  . Omega-3 Fatty Acids (FISH OIL) 1000 MG CAPS Take 1 capsule by mouth daily.      . polyethylene glycol powder (MIRALAX) powder Take 17 g by mouth daily as needed. For constipation      . sennosides-docusate sodium (SENOKOT-S) 8.6-50 MG tablet Take 1 tablet by mouth daily as needed. For constipation      . vitamin C (ASCORBIC ACID) 500 MG tablet Take  500 mg by mouth daily.        Marland Kitchen aspirin 325 MG EC tablet Take 325 mg by mouth daily.  No current facility-administered medications for this visit.    Physical Examination  Filed Vitals:   07/22/13 1313  BP: 128/61  Pulse:   Resp:    Filed Weights   07/22/13 1312  Weight: 177 lb 14.4 oz (80.695 kg)   Body mass index is 24.12 kg/(m^2).   General: WDWN male in NAD GAIT: right foot drop corrected by brace Eyes: PERRLA Pulmonary:  CTAB, Negative  Rales, Negative rhonchi, & Negative wheezing.  Cardiac: regular Rhythm,  Negative Murmurs.  VASCULAR EXAM Carotid Bruits Left Right   Negative Negative    Radial pulses are 1+ right and 2+ left.                                                                                                                            LE Pulses LEFT RIGHT       POPLITEAL   palpable    palpable    Gastrointestinal: soft, nontender, BS WNL, no r/g,  negative masses.  Musculoskeletal: Negative muscle atrophy/wasting. M/S 5/5 throughout, Extremities without ischemic changes. Wearing brace on right lower leg to prevent foot drop.  Neurologic: A&O X 3; Appropriate Affect ; SENSATION ;normal;  Speech is normal CN 2-12 intact, Pain and light touch intact in extremities, Motor exam as listed above.   Non-Invasive Vascular Imaging CAROTID DUPLEX 07/22/2013   Right ICA:  Patient CEA site with some plaque. Left ICA: 60 - 79 % stenosis.  These findings are Unchanged from previous outside facility exam.  Assessment: YANDEL ZEINER is a 79 y.o. male who presents with asymptomatic patent right ICA which is the CEA site and 60-79%  Left ICA  Stenosis. The  ICA stenosis is  Unchanged from previous exam.  Plan: Follow-up in 6 months with Carotid Duplex scan.  I discussed in depth with the patient the nature of atherosclerosis, and emphasized the importance of maximal medical management including strict control of blood pressure, blood glucose, and lipid  levels, obtaining regular exercise, and continued cessation of smoking.  The patient is aware that without maximal medical management the underlying atherosclerotic disease process will progress, limiting the benefit of any interventions. The patient was given information about stroke prevention and what symptoms should prompt the patient to seek immediate medical care. Thank you for allowing [place] to participate in this patient's care.  Charisse March, RN, MSN, FNP-C [facility] [place] Office: [phone]  Clinic Physician: Early  07/22/2013 1:31 PM

## 2013-07-22 NOTE — Patient Instructions (Signed)
Stroke Prevention Some medical conditions and behaviors are associated with an increased chance of having a stroke. You may prevent a stroke by making healthy choices and managing medical conditions. Reduce your risk of having a stroke by:  Staying physically active. Get at least 30 minutes of activity on most or all days.  Not smoking. It may also be helpful to avoid exposure to secondhand smoke.  Limiting alcohol use. Moderate alcohol use is considered to be:  No more than 2 drinks per day for men.  No more than 1 drink per day for nonpregnant women.  Eating healthy foods.  Include 5 or more servings of fruits and vegetables a day.  Certain diets may be prescribed to address high blood pressure, high cholesterol, diabetes, or obesity.  Managing your cholesterol levels.  A low-saturated fat, low-trans fat, low-cholesterol, and high-fiber diet may control cholesterol levels.  Take any prescribed medicines to control cholesterol as directed by your caregiver.  Managing your diabetes.  A controlled-carbohydrate, controlled-sugar diet is recommended to manage diabetes.  Take any prescribed medicines to control diabetes as directed by your caregiver.  Controlling your high blood pressure (hypertension).  A low-salt (sodium), low-saturated fat, low-trans fat, and low-cholesterol diet is recommended to manage high blood pressure.  Take any prescribed medicines to control hypertension as directed by your caregiver.  Maintaining a healthy weight.  A reduced-calorie, low-sodium, low-saturated fat, low-trans fat, low-cholesterol diet is recommended to manage weight.  Stopping drug abuse.  Avoiding birth control pills.  Talk to your caregiver about the risks of taking birth control pills if you are over 35 years old, smoke, get migraines, or have ever had a blood clot.  Getting evaluated for sleep disorders (sleep apnea).  Talk to your caregiver about getting a sleep evaluation  if you snore a lot or have excessive sleepiness.  Taking medicines as directed by your caregiver.  For some people, aspirin or blood thinners (anticoagulants) are helpful in reducing the risk of forming abnormal blood clots that can lead to stroke. If you have the irregular heart rhythm of atrial fibrillation, you should be on a blood thinner unless there is a good reason you cannot take them.  Understand all your medicine instructions. SEEK IMMEDIATE MEDICAL CARE IF:   You have sudden weakness or numbness of the face, arm, or leg, especially on one side of the body.  You have sudden confusion.  You have trouble speaking (aphasia) or understanding.  You have sudden trouble seeing in one or both eyes.  You have sudden trouble walking.  You have dizziness.  You have a loss of balance or coordination.  You have a sudden, severe headache with no known cause.  You have new chest pain or an irregular heartbeat. Any of these symptoms may represent a serious problem that is an emergency. Do not wait to see if the symptoms will go away. Get medical help right away. Call your local emergency services (911 in [place] Do not drive yourself to the hospital. Document Released: 09/07/2004 Document Revised: 10/23/2011 Document Reviewed: 01/31/2013 [employer] Patient Information 2014 [employer].  

## 2013-10-30 ENCOUNTER — Other Ambulatory Visit (INDEPENDENT_AMBULATORY_CARE_PROVIDER_SITE_OTHER): Payer: Medicare Other

## 2013-10-30 ENCOUNTER — Ambulatory Visit (INDEPENDENT_AMBULATORY_CARE_PROVIDER_SITE_OTHER): Payer: Medicare Other | Admitting: Internal Medicine

## 2013-10-30 ENCOUNTER — Encounter: Payer: Self-pay | Admitting: Internal Medicine

## 2013-10-30 VITALS — BP 108/70 | HR 62 | Temp 97.8°F | Ht 72.0 in | Wt 180.0 lb

## 2013-10-30 DIAGNOSIS — R0989 Other specified symptoms and signs involving the circulatory and respiratory systems: Secondary | ICD-10-CM

## 2013-10-30 DIAGNOSIS — J449 Chronic obstructive pulmonary disease, unspecified: Secondary | ICD-10-CM | POA: Insufficient documentation

## 2013-10-30 DIAGNOSIS — R0609 Other forms of dyspnea: Secondary | ICD-10-CM

## 2013-10-30 DIAGNOSIS — D509 Iron deficiency anemia, unspecified: Secondary | ICD-10-CM

## 2013-10-30 DIAGNOSIS — R0600 Dyspnea, unspecified: Secondary | ICD-10-CM

## 2013-10-30 LAB — CBC WITH DIFFERENTIAL/PLATELET
BASOS ABS: 0 10*3/uL (ref 0.0–0.1)
Basophils Relative: 0 % (ref 0.0–3.0)
EOS ABS: 0.3 10*3/uL (ref 0.0–0.7)
Eosinophils Relative: 3.3 % (ref 0.0–5.0)
HCT: 37.1 % — ABNORMAL LOW (ref 39.0–52.0)
Hemoglobin: 11.3 g/dL — ABNORMAL LOW (ref 13.0–17.0)
LYMPHS PCT: 20.3 % (ref 12.0–46.0)
Lymphs Abs: 2 10*3/uL (ref 0.7–4.0)
MCHC: 30.5 g/dL (ref 30.0–36.0)
MCV: 66.5 fl — ABNORMAL LOW (ref 78.0–100.0)
Monocytes Absolute: 0.7 10*3/uL (ref 0.1–1.0)
Monocytes Relative: 7 % (ref 3.0–12.0)
NEUTROS ABS: 6.8 10*3/uL (ref 1.4–7.7)
NEUTROS PCT: 69.4 % (ref 43.0–77.0)
Platelets: 179 10*3/uL (ref 150.0–400.0)
RBC: 5.58 Mil/uL (ref 4.22–5.81)
RDW: 15.5 % — AB (ref 11.5–14.6)
WBC: 9.9 10*3/uL (ref 4.5–10.5)

## 2013-10-30 LAB — BRAIN NATRIURETIC PEPTIDE: Pro B Natriuretic peptide (BNP): 398 pg/mL — ABNORMAL HIGH (ref 0.0–100.0)

## 2013-10-30 LAB — BASIC METABOLIC PANEL
BUN: 17 mg/dL (ref 6–23)
CALCIUM: 8.4 mg/dL (ref 8.4–10.5)
CO2: 30 mEq/L (ref 19–32)
CREATININE: 1.2 mg/dL (ref 0.4–1.5)
Chloride: 103 mEq/L (ref 96–112)
GFR: 61.34 mL/min (ref >60.00)
GLUCOSE: 106 mg/dL — AB (ref 70–99)
Potassium: 4.8 mEq/L (ref 3.5–5.1)
Sodium: 142 mEq/L (ref 135–145)

## 2013-10-30 MED ORDER — PREDNISONE 10 MG PO TABS: ORAL_TABLET | ORAL | Status: DC

## 2013-10-30 MED ORDER — TIOTROPIUM BROMIDE MONOHYDRATE 2.5 MCG/ACT IN AERS: INHALATION_SPRAY | RESPIRATORY_TRACT | Status: DC

## 2013-10-30 MED ORDER — LEVOFLOXACIN 500 MG PO TABS: 500.0000 mg | ORAL_TABLET | Freq: Every day | ORAL | Status: DC

## 2013-10-30 NOTE — Patient Instructions (Addendum)
Add spiriva 2 puffs each am Continue symbicort Take 2 puffs first thing in am and then another 2 puffs about 12 hours later.  Prednisone 10 mg take  4 each am x 2 days,   2 each am x 2 days,  1 each am x 2 days and stop  Levaquin x 7 days 500 mg one daily  For cough mucinex dm 1200 mg every 12 hours and try prilosec 20mg   Take 30-60 min before first meal of the day and Pepcid 20 mg one bedtime until cough is completely gone for at least a week without the need for cough suppression  GERD (REFLUX)  is an extremely common cause of respiratory symptoms just like yours, many times with no significant heartburn at all.    It can be treated with medication, but also with lifestyle changes including avoidance of late meals, excessive alcohol, smoking cessation, and avoid fatty foods, chocolate, peppermint, colas, red wine, and acidic juices such as orange juice.  NO MINT OR MENTHOL PRODUCTS SO NO COUGH DROPS  USE SUGARLESS CANDY INSTEAD (jolley ranchers or Stover's)  NO OIL BASED VITAMINS - use powdered substitutes.    Please remember to go to the lab  department downstairs for your tests - we will call you with the results when they are available.     Please schedule a follow up office visit in 2 weeks, sooner if needed  Needs spirometry and address microcytic anemia on return

## 2013-10-30 NOTE — Progress Notes (Addendum)
   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 Pulmonary Consult    Referred per Dr. Kathyrn Lass. Pt c/o cough and SOB for the past 10 days. He states cough is prod with large amounts of yellow sputum. He gets SOB walking to the mailbox and back. He uses ventolin for rescue approx 4 times per day.   cough bad esp in am rx pred and abx no better so far On symbicort 160 2bid and still feels need for saba qid  No obvious other patterns in day to day or daytime variabilty or assoc chronic cough or cp or chest tightness, subjective wheeze overt sinus or hb symptoms. No unusual exp hx or h/o childhood pna/ asthma or knowledge of premature birth.  Sleeping ok without nocturnal  or early am exacerbation  of respiratory  c/o's or need for noct saba. Also denies any obvious fluctuation of symptoms with weather or environmental changes or other aggravating or alleviating factors except as outlined above   Current Medications, Allergies, Complete Past Medical History, Past Surgical History, Family History, and Social History were reviewed in [employer] record.            Review of Systems  Constitutional: Negative for fever, chills, activity change, appetite change and unexpected weight change.  HENT: Positive for trouble swallowing. Negative for congestion, dental problem, postnasal drip, rhinorrhea, sneezing, sore throat and voice change.   Eyes: Negative for visual disturbance.  Respiratory: Positive for cough and shortness of breath. Negative for choking.   Cardiovascular: Positive for leg swelling. Negative for chest pain.   Gastrointestinal: Negative for nausea, vomiting and abdominal pain.  Genitourinary: Negative for difficulty urinating.  Musculoskeletal: Negative for arthralgias.  Skin: Negative for rash.  Psychiatric/Behavioral: Negative for behavioral problems and confusion.       Objective:   Physical Exam   Wt Readings from Last 3 Encounters:  10/30/13 180 lb (81.647 kg)  07/22/13 177 lb 14.4 oz (80.695 kg)  01/14/13 182 lb 1.6 oz (82.6 kg)    amb wm congested cough/ full top plate   HEENT mild turbinate edema.  Oropharynx no thrush or excess pnd or cobblestoning.  No JVD or cervical adenopathy. Mild accessory muscle hypertrophy. Trachea midline, nl thryroid. Chest was hyperinflated by percussion with diminished breath sounds and moderate increased exp time without wheeze. Hoover sign positive at mid inspiration. Regular rate and rhythm without murmur gallop or rub or increase P2 or edema.  Abd: no hsm, nl excursion. Ext warm without cyanosis or clubbing.    cxr 10/20/13 c/w copd, no acute changes Labs 10/30/13 Lab Results  Component Value Date   PROBNP 398.0* 10/30/2013   cbc ok though mcv chronically around 67 hc03 ok    Assessment & Plan:

## 2013-10-31 DIAGNOSIS — D509 Iron deficiency anemia, unspecified: Secondary | ICD-10-CM | POA: Insufficient documentation

## 2013-10-31 NOTE — Progress Notes (Signed)
Quick Note:  Spoke with pt and notified of results per Dr. Wert. Pt verbalized understanding and denied any questions.  ______ 

## 2013-10-31 NOTE — Assessment & Plan Note (Signed)
No real change baseline so ? Could he have thalassemia? Will address on return but doubt causing symptoms at present

## 2013-10-31 NOTE — Assessment & Plan Note (Addendum)
DDX of  difficult airways managment all start with A and  include Adherence, Ace Inhibitors, Acid Reflux, Active Sinus Disease, Alpha 1 Antitripsin deficiency, Anxiety masquerading as Airways dz,  ABPA,  allergy(esp in young), Aspiration (esp in elderly), Adverse effects of DPI,  Active smokers, plus two Bs  = Bronchiectasis and Beta blocker use..and one C= CHF  Adherence is always the initial "prime suspect" and is a multilayered concern that requires a "trust but verify" approach in every patient - starting with knowing how to use medications, especially inhalers, correctly, keeping up with refills and understanding the fundamental difference between maintenance and prns vs those medications only taken for a very short course and then stopped and not refilled.  - The proper method of use, as well as anticipated side effects, of a metered-dose inhaler are discussed and demonstrated to the patient. Improved effectiveness after extensive coaching during this visit to a level of approximately  75% so rec continue symbicort and add spiriva respimat to see if need for saba declines  ?Active sinus dz > levaquin then sinus ct if not better   ? Acid (or non-acid) GERD > always difficult to exclude as up to 75% of pts in some series report no assoc GI/ Heartburn symptoms> rec  diet restrictions/ reviewed and instructions given in writing.   ? CHF > bnp slt elevated but not above 500, could have mild diastolic dysfunction   See instructions for specific recommendations which were reviewed directly with the patient who was given a copy with highlighter outlining the key components.

## 2013-11-13 ENCOUNTER — Ambulatory Visit (INDEPENDENT_AMBULATORY_CARE_PROVIDER_SITE_OTHER): Payer: Medicare Other | Admitting: Internal Medicine

## 2013-11-13 ENCOUNTER — Encounter: Payer: Self-pay | Admitting: Internal Medicine

## 2013-11-13 ENCOUNTER — Encounter: Payer: Medicare Other | Admitting: Cardiology

## 2013-11-13 VITALS — BP 122/58 | HR 67 | Temp 97.6°F | Ht 72.0 in | Wt 176.0 lb

## 2013-11-13 DIAGNOSIS — R059 Cough, unspecified: Secondary | ICD-10-CM

## 2013-11-13 DIAGNOSIS — R05 Cough: Secondary | ICD-10-CM | POA: Insufficient documentation

## 2013-11-13 DIAGNOSIS — J449 Chronic obstructive pulmonary disease, unspecified: Secondary | ICD-10-CM

## 2013-11-13 NOTE — Patient Instructions (Addendum)
Please see patient coordinator before you leave today  to schedule CT sinus  Continue symbicort 160 Take 2 puffs first thing in am and then another 2 puffs about 12 hours later.  Continue spiriva 2 each am  Only use your albuterol (ventolin) as a rescue medication to be used if you can't catch your breath by resting or doing a relaxed purse lip breathing pattern.  - The less you use it, the better it will work when you need it. - Ok to use up to 2 puffs  every 4 hours if you must but call for immediate appointment if use goes up over your usual need - Don't leave home without it !!  (think of it like the spare tire for your car)    Stop fish oil  See Tammy NP w/in 2 weeks with all your medications, even over the counter meds, separated in two separate bags, the ones you take no matter what vs the ones you stop once you feel better and take only as needed when you feel you need them.   Tammy  will generate for you a new user friendly medication calendar that will put [place] all on the same page re: your medication use.     Without this process, it simply isn't possible to assure that we are providing  your outpatient care  with  the attention to detail we feel you deserve.   If we cannot assure that you're getting that kind of care,  then we cannot manage your problem effectively from this clinic.  Once you have seen Tammy and we are sure that we're all on the same page with your medication use she will arrange follow up with me.

## 2013-11-13 NOTE — Progress Notes (Signed)
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 Pulmonary Consult    Referred per Dr. Kathyrn Lass. Pt c/o cough and SOB for the past 10 days. He states cough is prod with large amounts of yellow sputum. He gets SOB walking to the mailbox and back. He uses ventolin for rescue approx 4 times per day.   cough bad esp in am rx pred and abx no better so far On symbicort 160 2bid and still feels need for saba qid rec Add spiriva 2 puffs each am Continue symbicort Take 2 puffs first thing in am and then another 2 puffs about 12 hours later.  Prednisone 10 mg take  4 each am x 2 days,   2 each am x 2 days,  1 each am x 2 days and stop  Levaquin x 7 days 500 mg one daily  For cough mucinex dm 1200 mg every 12 hours and try prilosec 20mg   Take 30-60 min before first meal of the day and Pepcid 20 mg one bedtime  GERD diet . Needs spirometry and address microcytic anemia on return > pt reports has thalassemia    11/13/2013 f/u ov/[name] re:  Chief Complaint  Patient presents with  . Follow-up    SOB and cough are improving. No new co's today. Using rescue inhaler approx 3 times per day.     Sill using fish oil Cough better still some nasty mucus but much less and does not disturb sleep Doe x mailbox is 200 yards slt uphill back and sometimes stop half way   No obvious day to day or daytime variabilty or assoc cp or chest tightness, subjective wheeze overt sinus or hb symptoms. No unusual exp hx or h/o childhood pna/ asthma or knowledge of premature  birth.  Sleeping ok without nocturnal  or early am exacerbation  of respiratory  c/o's or need for noct saba. Also denies any obvious fluctuation of symptoms with weather or environmental changes or other aggravating or alleviating factors except as outlined above   Current Medications, Allergies, Complete Past Medical History, Past Surgical History, Family History, and Social History were reviewed in [employer] record.  ROS  The following are not active complaints unless bolded sore throat, dysphagia, dental problems, itching, sneezing,  nasal congestion or excess/ purulent secretions, ear ache,   fever, chills, sweats, unintended wt loss, pleuritic or exertional cp, hemoptysis,  orthopnea pnd or leg swelling, presyncope, palpitations, heartburn, abdominal pain, anorexia, nausea, vomiting, diarrhea  or change in bowel or urinary habits, change in stools or urine, dysuria,hematuria,  rash, arthralgias, visual complaints, headache, numbness weakness or ataxia or problems with walking or coordination,  change in mood/affect or memory.              Objective:   Physical Exam  11/13/2013          176  Wt Readings from Last 3 Encounters:  10/30/13 180 lb (81.647 kg)  07/22/13 177 lb 14.4 oz (80.695  kg)  01/14/13 182 lb 1.6 oz (82.6 kg)    amb wm congested cough/ full top plate   HEENT mild turbinate edema.  Oropharynx no thrush or excess pnd or cobblestoning.  No JVD or cervical adenopathy. Mild accessory muscle hypertrophy. Trachea midline, nl thryroid. Chest was hyperinflated by percussion with diminished breath sounds and moderate increased exp time without wheeze. Hoover sign positive at mid inspiration. Regular rate and rhythm without murmur gallop or rub or increase P2 or edema.  Abd: no hsm, nl excursion. Ext warm without cyanosis or clubbing.    cxr 10/20/13 c/w copd, no acute changes Labs 10/30/13 Lab Results  Component Value Date   PROBNP 398.0* 10/30/2013   cbc  ok though mcv chronically around 67 hc03 ok    Assessment & Plan:

## 2013-11-14 ENCOUNTER — Institutional Professional Consult (permissible substitution): Payer: Medicare Other | Admitting: Internal Medicine

## 2013-11-14 ENCOUNTER — Ambulatory Visit (INDEPENDENT_AMBULATORY_CARE_PROVIDER_SITE_OTHER)
Admission: RE | Admit: 2013-11-14 | Discharge: 2013-11-14 | Disposition: A | Discharge status: Home / Self Care | Payer: Medicare Other | Source: Ambulatory Visit | Attending: Internal Medicine | Admitting: Internal Medicine

## 2013-11-14 DIAGNOSIS — R05 Cough: Secondary | ICD-10-CM

## 2013-11-14 DIAGNOSIS — R059 Cough, unspecified: Secondary | ICD-10-CM

## 2013-11-14 NOTE — Assessment & Plan Note (Addendum)
Check sinus ct/ hold further abx for now  Reviewed gerd diet restrictions > d/c fish oil    Each maintenance medication was reviewed in detail including most importantly the difference between maintenance and as needed and under what circumstances the prns are to be used.  Please see instructions for details which were reviewed in writing and the patient given a copy.

## 2013-11-14 NOTE — Assessment & Plan Note (Addendum)
-   spirometry 11/13/2013 >  FEV1  1.46 (45%) ratio 52 - 11/13/2013  Walked RA x 3 laps @ 185 ft each stopped due to  End of study no desats - 11/13/2013 p extensive coaching HFA effectiveness =    75%   DDX of  difficult airways managment all start with A and  include Adherence, Ace Inhibitors, Acid Reflux, Active Sinus Disease, Alpha 1 Antitripsin deficiency, Anxiety masquerading as Airways dz,  ABPA,  allergy(esp in young), Aspiration (esp in elderly), Adverse effects of DPI,  Active smokers, plus two Bs  = Bronchiectasis and Beta blocker use..and one C= CHF  Adherence is always the initial "prime suspect" and is a multilayered concern that requires a "trust but verify" approach in every patient - starting with knowing how to use medications, especially inhalers, correctly, keeping up with refills and understanding the fundamental difference between maintenance and prns vs those medications only taken for a very short course and then stopped and not refilled.  - needs med reconciliation next - The proper method of use, as well as anticipated side effects, of a metered-dose inhaler are discussed and demonstrated to the patient. Improved effectiveness after extensive coaching during this visit to a level of approximately  75% so continue symbicort and spiriva for now   ? Active sinus dz > check sinus ct   ? Allergies > unlikely new onset age 80/ trial of 1st gen h1 next   ? Asp/gerd > max diet reviewed, trial of ppi / h2hs next  ? chf > very unlikely based on hx, cxr and bnp < 500 though haven't excluded mild diastolic chf contributing.   See instructions for specific recommendations which were reviewed directly with the patient who was given a copy with highlighter outlining the key components.

## 2013-11-17 ENCOUNTER — Ambulatory Visit (INDEPENDENT_AMBULATORY_CARE_PROVIDER_SITE_OTHER): Payer: Medicare Other | Admitting: Cardiology

## 2013-11-17 ENCOUNTER — Encounter: Payer: Self-pay | Admitting: Cardiology

## 2013-11-17 ENCOUNTER — Telehealth: Payer: Self-pay | Admitting: Cardiology

## 2013-11-17 ENCOUNTER — Telehealth: Payer: Self-pay | Admitting: Internal Medicine

## 2013-11-17 VITALS — BP 152/70 | HR 71 | Ht 72.0 in | Wt 176.8 lb

## 2013-11-17 DIAGNOSIS — I509 Heart failure, unspecified: Secondary | ICD-10-CM

## 2013-11-17 DIAGNOSIS — I2510 Atherosclerotic heart disease of native coronary artery without angina pectoris: Secondary | ICD-10-CM

## 2013-11-17 DIAGNOSIS — Z79899 Other long term (current) drug therapy: Secondary | ICD-10-CM

## 2013-11-17 DIAGNOSIS — I5022 Chronic systolic (congestive) heart failure: Secondary | ICD-10-CM

## 2013-11-17 DIAGNOSIS — R609 Edema, unspecified: Secondary | ICD-10-CM

## 2013-11-17 LAB — LIPID PANEL
Cholesterol: 218 mg/dL — ABNORMAL HIGH (ref 0–200)
HDL: 42.6 mg/dL (ref >39.00)
LDL Cholesterol: 152 mg/dL — ABNORMAL HIGH (ref 0–99)
Total CHOL/HDL Ratio: 5
Triglycerides: 116 mg/dL (ref 0.0–149.0)
VLDL: 23.2 mg/dL (ref 0.0–40.0)

## 2013-11-17 MED ORDER — SIMVASTATIN 10 MG PO TABS: 10.0000 mg | ORAL_TABLET | Freq: Every day | ORAL | Status: DC

## 2013-11-17 MED ORDER — NITROGLYCERIN 0.4 MG SL SUBL: 0.4000 mg | SUBLINGUAL_TABLET | SUBLINGUAL | Status: DC | PRN

## 2013-11-17 NOTE — Patient Instructions (Signed)
Your physician recommends that you return for a FASTING lipid profile today.  Your physician recommends that you continue on your current medications as directed. Please refer to the Current Medication list given to you today.  Your physician has requested that you have a lower or upper extremity venous duplex today . This test is an ultrasound of the veins in the legs or arms. It looks at venous blood flow that carries blood from the heart to the legs or arms. Allow one hour for a Lower Venous exam. Allow thirty minutes for an Upper Venous exam. There are no restrictions or special instructions.  Your physician wants you to follow-up in: 1 year with Dr. Meda Coffee. You will receive a reminder letter in the mail two months in advance. If you don't receive a letter, please call our office to schedule the follow-up appointment.

## 2013-11-17 NOTE — Telephone Encounter (Signed)
Message copied by Alcario Drought on Mon Nov 17, 2013  4:48 PM ------      Message from: Dorothy Spark      Created: Mon Nov 17, 2013  4:45 PM       Nadine Ryle, would you prescribe this patient simvastatin 10 mg po QHS and follow C-MET in 3 weeks? His LDL is high.      Thank you,      K ------

## 2013-11-17 NOTE — Progress Notes (Signed)
Patient ID: MATHAYUS STANBERY, male   DOB: May 09, 1924, 80 y.o.   MRN: [id number]     Patient Name: [name] Date of Encounter: 11/17/2013  Primary Care Provider:   Melinda Crutch, MD Primary Cardiologist:  Dorothy Spark [facility] Dr Verl Blalock)  Problem List   Past Medical History  Diagnosis Date  . Carotid artery stenosis     without infarction  . Hypertension   . Chronic diastolic heart failure   . Edema   . Bronchitis, acute   . Mini stroke     20 yrs ago during cartodid surgery  . CHF (congestive heart failure)   . Myocardial infarction 1979    Dr Verl Blalock - Cardiologist  . Coronary artery disease     native vessel  . Shortness of breath     with exersition  . COPD (chronic obstructive pulmonary disease)     "mild"  . Sleep apnea     CPAP doesn't use know.  last tested > 5 years abd < 10..  . Stroke     "mild stroke Years ago"  . Cancer     prostate  . Arthritis   . Foot drop, right   . Neuromuscular disorder     neuropathy- LE  . Neuropathy    Past Surgical History  Procedure Laterality Date  . Penile prosthesis implant    . Cholecystectomy    . Right carotid endarterectomy    . Back surgery    . Lumbar laminectomy  03/24/10    L3-4 and L4-5  . Ankle surgery      bone infection  . Cardiac catheterization    . Carotid endarterectomy      right  . Appendectomy    . Joint replacement      Left  . Radioactive seed implant    . Pars plana vitrectomy  03/19/2012    Procedure: PARS PLANA VITRECTOMY WITH 25 GAUGE;  Surgeon: Hayden Pedro, MD;  Location: [place];  Service: Ophthalmology;  Laterality: Right;    Allergies  Allergies  Allergen Reactions  . Amoxicillin     REACTION: unspecified  . Clopidogrel Bisulfate     REACTION: red blue, black blotches  . Dilaudid [Hydromorphone Hcl]   . Penicillins   . Sulfa Antibiotics   . Sulfamethoxazole     REACTION: unspecified  . Sulfonamide Derivatives     HPI  [name] returns today for evaluation and management of  his coronary artery disease, chronic diastolic heart failure, and carotid artery disease.  He's having no angina or chest discomfort. He admits to shortness of breath on moderate activity. He denies any symptoms of TIAs or mini strokes. His mobility has diminished. He has a fair amount of stress at home with his wife is suffering from dementia. The patient complains of mild right lower extremity edema around his ankles. He admits to black terry stools but no bright red blood in his stool.   Home Medications  Prior to Admission medications   Medication Sig Start Date End Date Taking? Authorizing Provider  acetaminophen (TYLENOL) 650 MG CR tablet Take 650 mg by mouth every 8 (eight) hours as needed. For pain   Yes Historical Provider, MD  albuterol (VENTOLIN HFA) 108 (90 BASE) MCG/ACT inhaler Inhale 2 puffs into the lungs every 6 (six) hours as needed for wheezing or shortness of breath.   Yes Historical Provider, MD  aspirin 81 MG tablet Take 81 mg by mouth daily.   Yes  Historical Provider, MD  budesonide-formoterol (SYMBICORT) 160-4.5 MCG/ACT inhaler Inhale 2 puffs into the lungs 2 (two) times daily.   Yes Historical Provider, MD  carvedilol (COREG) 12.5 MG tablet Take 6.25 mg by mouth 2 (two) times daily with a meal.   Yes Historical Provider, MD  docusate sodium (COLACE) 100 MG capsule Take 100 mg by mouth daily as needed. For constipation   Yes Historical Provider, MD  furosemide (LASIX) 20 MG tablet Take 1 tablet (20 mg total) by mouth daily. 11/11/12  Yes Renella Cunas, MD  gemfibrozil (LOPID) 600 MG tablet Take 600 mg by mouth 2 (two) times daily before a meal.   Yes Historical Provider, MD  mirabegron ER (MYRBETRIQ) 50 MG TB24 Take 50 mg by mouth daily.   Yes Historical Provider, MD  Multiple Vitamins-Minerals (EYE VITAMINS) CAPS Take 1 capsule by mouth daily.   Yes Historical Provider, MD  nitroGLYCERIN (NITROSTAT) 0.4 MG SL tablet Place 1 tablet (0.4 mg total) under the tongue every 5  (five) minutes as needed. For chest pain 05/15/12  Yes Renella Cunas, MD  polyethylene glycol powder (MIRALAX) powder Take 17 g by mouth daily as needed. For constipation   Yes Historical Provider, MD  Pregabalin (LYRICA PO) Take 1 tablet by mouth 2 (two) times daily.   Yes Historical Provider, MD  sennosides-docusate sodium (SENOKOT-S) 8.6-50 MG tablet Take 1 tablet by mouth daily as needed. For constipation   Yes Historical Provider, MD  Tiotropium Bromide Monohydrate 2.5 MCG/ACT AERS Inhale 2 puffs each morning 10/30/13  Yes Tanda Rockers, MD    Family History  Family History  Problem Relation Age of Onset  . Thalassemia      Family history  . Cancer Mother 51    bone cancer  . Diabetes Mother   . Heart disease Mother   . Heart attack Mother   . Stroke Father 36  . Hypertension Sister   . Heart disease Brother   . Heart attack Brother 31    Social History  History   Social History  . Marital Status: Married    Spouse Name: N/A    Number of Children: N/A  . Years of Education: N/A   Occupational History  . Not on file.   Social History Main Topics  . Smoking status: Former Smoker -- 1.00 packs/day for 20 years    Types: Cigarettes    Quit date: 08/14/1977  . Smokeless tobacco: Never Used  . Alcohol Use: No     Comment: occasional  . Drug Use: No  . Sexual Activity: Not on file   Other Topics Concern  . Not on file   Social History Narrative  . No narrative on file     Review of Systems, as per HPI, otherwise negative General:  No chills, fever, night sweats or weight changes.  Cardiovascular:  No chest pain, dyspnea on exertion, edema, orthopnea, palpitations, paroxysmal nocturnal dyspnea. Dermatological: No rash, lesions/masses Respiratory: No cough, dyspnea Urologic: No hematuria, dysuria Abdominal:   No nausea, vomiting, diarrhea, bright red blood per rectum, melena, or hematemesis Neurologic:  No visual changes, wkns, changes in mental status. All  other systems reviewed and are otherwise negative except as noted above.  Physical Exam  Blood pressure 152/70, pulse 71, height 6' (1.829 m), weight 176 lb 12.8 oz (80.196 kg).  General: Pleasant, NAD Psych: Normal affect. Neuro: Alert and oriented X 3. Moves all extremities spontaneously. HEENT: Normal  Neck: Supple without bruits or JVD.  Lungs:  Resp regular and unlabored, CTA. Heart: RRR no s3, s4, or murmurs. Abdomen: Soft, non-tender, non-distended, BS + x 4.  Extremities: No clubbing, cyanosis, mild perimalleolar pitting edema of the right lower extremity. DP/PT/Radials 2+ and equal bilaterally.  Labs:  No results found for this basename: CKTOTAL, CKMB, TROPONINI,  in the last 72 hours Lab Results  Component Value Date   WBC 9.9 10/30/2013   HGB 11.3* 10/30/2013   HCT 37.1* 10/30/2013   MCV 66.5 Repeated and verified X2.* 10/30/2013   PLT 179.0 10/30/2013    Lab Results  Component Value Date   DDIMER  Value: 1.60        AT THE INHOUSE ESTABLISHED CUTOFF VALUE OF 0.48 ug/mL FEU, THIS ASSAY HAS BEEN DOCUMENTED IN THE LITERATURE TO HAVE A SENSITIVITY AND NEGATIVE PREDICTIVE VALUE OF AT LEAST 98 TO 99%.  THE TEST RESULT SHOULD BE CORRELATED WITH AN ASSESSMENT OF THE CLINICAL PROBABILITY OF DVT / VTE.* 12/18/2008   No components found with this basename: POCBNP,     Component Value Date/Time   NA 142 10/30/2013 1217   K 4.8 10/30/2013 1217   CL 103 10/30/2013 1217   CO2 30 10/30/2013 1217   GLUCOSE 106* 10/30/2013 1217   BUN 17 10/30/2013 1217   CREATININE 1.2 10/30/2013 1217   CREATININE 1.10 04/24/2011 1200   CALCIUM 8.4 10/30/2013 1217   PROT 6.6 04/30/2011 0625   ALBUMIN 3.4* 04/30/2011 0625   AST 15 04/30/2011 0625   ALT 16 04/30/2011 0625   ALKPHOS 73 04/30/2011 0625   BILITOT 0.8 04/30/2011 0625   GFRNONAA 54* 03/19/2012 0936   GFRAA 63* 03/19/2012 0936   Lab Results  Component Value Date   CHOL 175 04/30/2011   HDL 47 04/30/2011   LDLCALC 112* 04/30/2011   TRIG 79 04/30/2011     Accessory Clinical Findings  Echocardiogram 12/18/2008  Left ventricle: Very poor acoustic window limits study. LVEF is depressed at approximately 25 to30% with akinesis the the inferior wall, inferoseptal wall, apex, distal lateral, mid/distal anterior; hypokinesis/akinesis inferiorly. The cavity size was normal. Wall thickness was normal. 2. Mitral valve: Mildly to moderately calcified annulus. Mildly thickened leaflets . Mild regurgitation.  ECG - sinus rhythm, 72 beats per minute, anterior infarct, age undetermined abnormal EKG.    Assessment & Plan  A very pleasant 80 year old gentleman previously followed by Dr. Verl Blalock  1. CAD -prior anterior myocardial infarction, currently asymptomatic, will continue low-dose aspirin, carvedilol, and when necessary nitroglycerin  2. Chronic systolic CHF - LVEF 16-10% currently euvolemic  3. Hypertension - elevated today however patient states that he hasn't taken his medicines today yet been controlled at home.  4. unilateral right lower extremity edema - we will order a venous duplex of his right lower extremity to rule out DVT. These might be secondary to chronic immobilization of his right lower extremity secondary to foot drop. If no DVT we will continue the same regimen.  5. Lipids - we will check today  6. severe microcytic anemia - no colonoscopy recently and blood per stools patient is strongly encouraged to fall we his GI physician.  Followup in 1 year  Dorothy Spark, MD, [facility] 11/17/2013, 11:01 AM

## 2013-11-17 NOTE — Telephone Encounter (Signed)
Pt notified. Meds updated. Labs ordered.

## 2013-11-17 NOTE — Telephone Encounter (Signed)
Pt is aware of CT results. 

## 2013-11-17 NOTE — Progress Notes (Signed)
Quick Note:  [id number] ______ 

## 2013-11-18 ENCOUNTER — Ambulatory Visit (HOSPITAL_COMMUNITY): Payer: Medicare Other | Attending: Cardiology | Admitting: Cardiology

## 2013-11-18 DIAGNOSIS — I2510 Atherosclerotic heart disease of native coronary artery without angina pectoris: Secondary | ICD-10-CM | POA: Insufficient documentation

## 2013-11-18 DIAGNOSIS — M7989 Other specified soft tissue disorders: Secondary | ICD-10-CM

## 2013-11-18 DIAGNOSIS — R609 Edema, unspecified: Secondary | ICD-10-CM | POA: Insufficient documentation

## 2013-11-18 NOTE — Progress Notes (Signed)
Right lower extremity venous duplex completed

## 2013-11-27 ENCOUNTER — Encounter: Payer: Medicare Other | Admitting: Adult Health

## 2013-11-30 ENCOUNTER — Emergency Department (HOSPITAL_COMMUNITY)
Admission: EM | Admit: 2013-11-30 | Discharge: 2013-11-30 | Disposition: A | Discharge status: Home / Self Care | Payer: Medicare Other | Attending: Emergency Medicine | Admitting: Emergency Medicine

## 2013-11-30 ENCOUNTER — Encounter (HOSPITAL_COMMUNITY): Payer: Self-pay | Admitting: Emergency Medicine

## 2013-11-30 ENCOUNTER — Emergency Department (HOSPITAL_COMMUNITY): Payer: Medicare Other

## 2013-11-30 DIAGNOSIS — Y939 Activity, unspecified: Secondary | ICD-10-CM | POA: Insufficient documentation

## 2013-11-30 DIAGNOSIS — Z87891 Personal history of nicotine dependence: Secondary | ICD-10-CM | POA: Insufficient documentation

## 2013-11-30 DIAGNOSIS — Z7982 Long term (current) use of aspirin: Secondary | ICD-10-CM | POA: Insufficient documentation

## 2013-11-30 DIAGNOSIS — M129 Arthropathy, unspecified: Secondary | ICD-10-CM | POA: Insufficient documentation

## 2013-11-30 DIAGNOSIS — G709 Myoneural disorder, unspecified: Secondary | ICD-10-CM | POA: Insufficient documentation

## 2013-11-30 DIAGNOSIS — IMO0002 Reserved for concepts with insufficient information to code with codable children: Secondary | ICD-10-CM

## 2013-11-30 DIAGNOSIS — J4489 Other specified chronic obstructive pulmonary disease: Secondary | ICD-10-CM | POA: Insufficient documentation

## 2013-11-30 DIAGNOSIS — Z79899 Other long term (current) drug therapy: Secondary | ICD-10-CM | POA: Insufficient documentation

## 2013-11-30 DIAGNOSIS — I2510 Atherosclerotic heart disease of native coronary artery without angina pectoris: Secondary | ICD-10-CM | POA: Insufficient documentation

## 2013-11-30 DIAGNOSIS — S0180XA Unspecified open wound of other part of head, initial encounter: Secondary | ICD-10-CM | POA: Insufficient documentation

## 2013-11-30 DIAGNOSIS — Z9889 Other specified postprocedural states: Secondary | ICD-10-CM | POA: Insufficient documentation

## 2013-11-30 DIAGNOSIS — Z8673 Personal history of transient ischemic attack (TIA), and cerebral infarction without residual deficits: Secondary | ICD-10-CM | POA: Insufficient documentation

## 2013-11-30 DIAGNOSIS — J449 Chronic obstructive pulmonary disease, unspecified: Secondary | ICD-10-CM | POA: Insufficient documentation

## 2013-11-30 DIAGNOSIS — I10 Essential (primary) hypertension: Secondary | ICD-10-CM | POA: Insufficient documentation

## 2013-11-30 DIAGNOSIS — W19XXXA Unspecified fall, initial encounter: Secondary | ICD-10-CM

## 2013-11-30 DIAGNOSIS — W010XXA Fall on same level from slipping, tripping and stumbling without subsequent striking against object, initial encounter: Secondary | ICD-10-CM | POA: Insufficient documentation

## 2013-11-30 DIAGNOSIS — Y929 Unspecified place or not applicable: Secondary | ICD-10-CM | POA: Insufficient documentation

## 2013-11-30 DIAGNOSIS — I5032 Chronic diastolic (congestive) heart failure: Secondary | ICD-10-CM | POA: Insufficient documentation

## 2013-11-30 DIAGNOSIS — Z8546 Personal history of malignant neoplasm of prostate: Secondary | ICD-10-CM | POA: Insufficient documentation

## 2013-11-30 DIAGNOSIS — Z880 Allergy status to penicillin: Secondary | ICD-10-CM | POA: Insufficient documentation

## 2013-11-30 DIAGNOSIS — G589 Mononeuropathy, unspecified: Secondary | ICD-10-CM | POA: Insufficient documentation

## 2013-11-30 DIAGNOSIS — I252 Old myocardial infarction: Secondary | ICD-10-CM | POA: Insufficient documentation

## 2013-11-30 DIAGNOSIS — S0990XA Unspecified injury of head, initial encounter: Secondary | ICD-10-CM | POA: Insufficient documentation

## 2013-11-30 NOTE — ED Provider Notes (Signed)
CSN: [id number]     Arrival date & time 11/30/13  1311 History   First MD Initiated Contact with Patient 11/30/13 1311     Chief Complaint  Patient presents with  . Fall     (Consider location/radiation/quality/duration/timing/severity/associated sxs/prior Treatment) HPI Patient presents to the emergency department following a fall that occurred just prior to arrival.  The patient, states, that his leg brace got caught on the carpet and caused him to trip and fall.  The patient, states he struck the left side of his for head on the ground.  The patient has a laceration to the lateral aspect of his left eyebrow.  Patient, states he did not have any chest pain, shortness of breath, nausea, vomiting, abdominal pain, back pain, neck pain, blurred vision, weakness, dizziness, numbness, fever, or syncope.  Patient, states, that he did not take any medications prior to arrival.  He states, that palpation of the area makes the pain, worse.  He did clean the area and cover with a bandage prior to arrival. Past Medical History  Diagnosis Date  . Carotid artery stenosis     without infarction  . Hypertension   . Chronic diastolic heart failure   . Edema   . Bronchitis, acute   . Mini stroke     20 yrs ago during cartodid surgery  . CHF (congestive heart failure)   . Myocardial infarction 1979    Dr Verl Blalock - Cardiologist  . Coronary artery disease     native vessel  . Shortness of breath     with exersition  . COPD (chronic obstructive pulmonary disease)     "mild"  . Sleep apnea     CPAP doesn't use know.  last tested > 5 years abd < 10..  . Stroke     "mild stroke Years ago"  . Cancer     prostate  . Arthritis   . Foot drop, right   . Neuromuscular disorder     neuropathy- LE  . Neuropathy    Past Surgical History  Procedure Laterality Date  . Penile prosthesis implant    . Cholecystectomy    . Right carotid endarterectomy    . Back surgery    . Lumbar laminectomy  03/24/10   L3-4 and L4-5  . Ankle surgery      bone infection  . Cardiac catheterization    . Carotid endarterectomy      right  . Appendectomy    . Joint replacement      Left  . Radioactive seed implant    . Pars plana vitrectomy  03/19/2012    Procedure: PARS PLANA VITRECTOMY WITH 25 GAUGE;  Surgeon: Hayden Pedro, MD;  Location: [place];  Service: Ophthalmology;  Laterality: Right;   Family History  Problem Relation Age of Onset  . Thalassemia      Family history  . Cancer Mother 65    bone cancer  . Diabetes Mother   . Heart disease Mother   . Heart attack Mother   . Stroke Father 33  . Hypertension Sister   . Heart disease Brother   . Heart attack Brother 69   History  Substance Use Topics  . Smoking status: Former Smoker -- 1.00 packs/day for 20 years    Types: Cigarettes    Quit date: 08/14/1977  . Smokeless tobacco: Never Used  . Alcohol Use: No     Comment: occasional    Review of Systems All other systems negative  except as documented in the HPI. All pertinent positives and negatives as reviewed in the HPI.   Allergies  Amoxicillin; Clopidogrel bisulfate; Dilaudid; Penicillins; Sulfa antibiotics; Sulfamethoxazole; and Sulfonamide derivatives  Home Medications   Prior to Admission medications   Medication Sig Start Date End Date Taking? Authorizing Provider  acetaminophen (TYLENOL) 650 MG CR tablet Take 650 mg by mouth every 8 (eight) hours as needed. For pain    Historical Provider, MD  albuterol (VENTOLIN HFA) 108 (90 BASE) MCG/ACT inhaler Inhale 2 puffs into the lungs every 6 (six) hours as needed for wheezing or shortness of breath.    Historical Provider, MD  aspirin 81 MG tablet Take 81 mg by mouth daily.    Historical Provider, MD  budesonide-formoterol (SYMBICORT) 160-4.5 MCG/ACT inhaler Inhale 2 puffs into the lungs 2 (two) times daily.    Historical Provider, MD  carvedilol (COREG) 12.5 MG tablet Take 6.25 mg by mouth 2 (two) times daily with a meal.     Historical Provider, MD  docusate sodium (COLACE) 100 MG capsule Take 100 mg by mouth daily as needed. For constipation    Historical Provider, MD  furosemide (LASIX) 20 MG tablet Take 1 tablet (20 mg total) by mouth daily. 11/11/12   Renella Cunas, MD  gemfibrozil (LOPID) 600 MG tablet Take 600 mg by mouth 2 (two) times daily before a meal.    Historical Provider, MD  mirabegron ER (MYRBETRIQ) 50 MG TB24 Take 50 mg by mouth daily.    Historical Provider, MD  Multiple Vitamins-Minerals (EYE VITAMINS) CAPS Take 1 capsule by mouth daily.    Historical Provider, MD  nitroGLYCERIN (NITROSTAT) 0.4 MG SL tablet Place 1 tablet (0.4 mg total) under the tongue every 5 (five) minutes as needed. For chest pain 11/17/13   Dorothy Spark, MD  polyethylene glycol powder [facility]) powder Take 17 g by mouth daily as needed. For constipation    Historical Provider, MD  Pregabalin (LYRICA PO) Take 1 tablet by mouth 2 (two) times daily.    Historical Provider, MD  sennosides-docusate sodium (SENOKOT-S) 8.6-50 MG tablet Take 1 tablet by mouth daily as needed. For constipation    Historical Provider, MD  simvastatin (ZOCOR) 10 MG tablet Take 1 tablet (10 mg total) by mouth daily. 11/17/13   Dorothy Spark, MD  Tiotropium Bromide Monohydrate 2.5 MCG/ACT AERS Inhale 2 puffs each morning 10/30/13   Tanda Rockers, MD   BP 162/62  Pulse 72  Temp(Src) 97.5 F (36.4 C) (Oral)  Resp 18  SpO2 99% Physical Exam  Nursing note and vitals reviewed. Constitutional: He is oriented to person, place, and time. He appears well-developed and well-nourished. No distress.  HENT:  Head: Normocephalic.    Right Ear: Tympanic membrane normal.  Left Ear: Tympanic membrane normal.  Nose: Nose normal.  Mouth/Throat: Uvula is midline, oropharynx is clear and moist and mucous membranes are normal.  Eyes: EOM are normal. Pupils are equal, round, and reactive to light.  Neck: Normal range of motion. Neck supple.  Cardiovascular:  Normal rate, regular rhythm and normal heart sounds.  Exam reveals no gallop and no friction rub.   No murmur heard. Pulmonary/Chest: Effort normal and breath sounds normal. No respiratory distress.  Neurological: He is alert and oriented to person, place, and time. He has normal strength. No sensory deficit. He exhibits normal muscle tone. Coordination normal. GCS eye subscore is 4. GCS verbal subscore is 5. GCS motor subscore is 6.  Skin: Skin is  warm and dry.    ED Course  Procedures (including critical care time) LACERATION REPAIR Performed by: Brent General Authorized by: Resa Miner Amand Lemoine Consent: Verbal consent obtained. Risks and benefits: risks, benefits and alternatives were discussed Consent given by: patient Patient identity confirmed: provided demographic data Prepped and Draped in normal sterile fashion Wound explored  Laceration Location: Lateral temporal region just lateral to the eyebrow  Laceration Length: 6 cm  No Foreign Bodies seen or palpated  Anesthesia: local infiltration  Local anesthetic: lidocaine 2 % without epinephrine  Anesthetic total: 5 ml  Irrigation method: syringe Amount of cleaning: standard  Skin closure: 6-0 Prolene   Number of sutures:8  Technique: Simple Interrupted  Patient tolerance: Patient tolerated the procedure well with no immediate complications.    Patient will have CT scan of the head and neck.  The patient does not have any other injuries noted on exam.  Will be followed by Margarita Mail PA-C for his testing results.  Resa Miner Savion Washam, PA-C 12/04/13 0600

## 2013-11-30 NOTE — Discharge Instructions (Signed)
WOUND CARE Please have your stitches/staples removed in 7 days or sooner if you have concerns. You may do this at any available urgent care or at your primary care doctor's office.  Keep area clean and dry for 24 hours. Do not remove bandage, if applied.  After 24 hours, remove bandage and wash wound gently with mild soap and warm water. Reapply a new bandage after cleaning wound, if directed.  Continue daily cleansing with soap and water until stitches/staples are removed.  Do not apply any ointments or creams to the wound while stitches/staples are in place, as this may cause delayed healing.  Seek medical careif you experience any of the following signs of infection: Swelling, redness, pus drainage, streaking, fever >101.0 F  Seek care if you experience excessive bleeding that does not stop after 15-20 minutes of constant, firm pressure.    Fall Prevention and Home Safety Falls cause injuries and can affect all age groups. It is possible to use preventive measures to significantly decrease the likelihood of falls. There are many simple measures which can make your home safer and prevent falls. OUTDOORS  Repair cracks and edges of walkways and driveways.  Remove high doorway thresholds.  Trim shrubbery on the main path into your home.  Have good outside lighting.  Clear walkways of tools, rocks, debris, and clutter.  Check that handrails are not broken and are securely fastened. Both sides of steps should have handrails.  Have leaves, snow, and ice cleared regularly.  Use sand or salt on walkways during winter months.  In the garage, clean up grease or oil spills. BATHROOM  Install night lights.  Install grab bars by the toilet and in the tub and shower.  Use non-skid mats or decals in the tub or shower.  Place a plastic non-slip stool in the shower to sit on, if needed.  Keep floors dry and clean up all water on the floor immediately.  Remove soap  buildup in the tub or shower on a regular basis.  Secure bath mats with non-slip, double-sided rug tape.  Remove throw rugs and tripping hazards from the floors. BEDROOMS  Install night lights.  Make sure a bedside light is easy to reach.  Do not use oversized bedding.  Keep a telephone by your bedside.  Have a firm chair with side arms to use for getting dressed.  Remove throw rugs and tripping hazards from the floor. KITCHEN  Keep handles on pots and pans turned toward the center of the stove. Use back burners when possible.  Clean up spills quickly and allow time for drying.  Avoid walking on wet floors.  Avoid hot utensils and knives.  Position shelves so they are not too high or low.  Place commonly used objects within easy reach.  If necessary, use a sturdy step stool with a grab bar when reaching.  Keep electrical cables out of the way.  Do not use floor polish or wax that makes floors slippery. If you must use wax, use non-skid floor wax.  Remove throw rugs and tripping hazards from the floor. STAIRWAYS  Never leave objects on stairs.  Place handrails on both sides of stairways and use them. Fix any loose handrails. Make sure handrails on both sides of the stairways are as long as the stairs.  Check carpeting to make sure it is firmly attached along stairs. Make repairs to worn or loose carpet promptly.  Avoid placing throw rugs at the top or bottom of stairways, or  properly secure the rug with carpet tape to prevent slippage. Get rid of throw rugs, if possible.  Have an electrician put in a light switch at the top and bottom of the stairs. OTHER FALL PREVENTION TIPS  Wear low-heel or rubber-soled shoes that are supportive and fit well. Wear closed toe shoes.  When using a stepladder, make sure it is fully opened and both spreaders are firmly locked. Do not climb a closed stepladder.  Add color or contrast paint or tape to grab bars and handrails in  your home. Place contrasting color strips on first and last steps.  Learn and use mobility aids as needed. Install an electrical emergency response system.  Turn on lights to avoid dark areas. Replace light bulbs that burn out immediately. Get light switches that glow.  Arrange furniture to create clear pathways. Keep furniture in the same place.  Firmly attach carpet with non-skid or double-sided tape.  Eliminate uneven floor surfaces.  Select a carpet pattern that does not visually hide the edge of steps.  Be aware of all pets. OTHER HOME SAFETY TIPS  Set the water temperature for 120 F (48.8 C).  Keep emergency numbers on or near the telephone.  Keep smoke detectors on every level of the home and near sleeping areas. Document Released: 07/21/2002 Document Revised: 01/30/2012 Document Reviewed: 10/20/2011 [facility] Patient Information 2014 [place].

## 2013-11-30 NOTE — ED Notes (Addendum)
Pt reports to the ED from a restaurant. Pt reports he tripped over a rug and fell and hit hit head. Denies any syncope or LOC. Pt denies any neck or back pain. Pt passed [employer] in the field. Pt has 2 inch laceration bedside his left eye, multiple skin tears to left arm, and another skin tear to the left lateral forehead. Pt also has some swelling and erythema inferior to the left eye. VSS en route. Bleeding controlled. Pt is on blood thinners. Bandages placed on head and left arm. Pt A&Ox4, resp e/u, and skin warm and dry.

## 2013-11-30 NOTE — ED Provider Notes (Signed)
   Patient care assumed from [place] lawyer. Patient mechanical fall today.  Awaiting CT scan, laceration to right temple.  If negative he may be discharged.  \patietn CT negative for acute abnormality. The patient appears reasonably screened and/or stabilized for discharge and I doubt any other medical condition or other [facility] requiring further screening, evaluation, or treatment in the ED at this time prior to discharge.   Margarita Mail, PA-C 12/01/13 0028

## 2013-12-04 NOTE — ED Provider Notes (Signed)
Medical screening examination/treatment/procedure(s) were conducted as a shared visit with non-physician practitioner(s) and myself.  I personally evaluated the patient during the encounter.   EKG Interpretation None      Patient here s/p fall - no neuro deficits. On aspirin - awaiting scans.  Osvaldo Shipper, MD 12/04/13 1710

## 2013-12-04 NOTE — ED Provider Notes (Signed)
Medical screening examination/treatment/procedure(s) were conducted as a shared visit with non-physician practitioner(s) and myself.  I personally evaluated the patient during the encounter.   EKG Interpretation None       71M - on aspirin - mechanical fall. Small wound to face, repaired by Irena Cords. Will scan head, neck.  Osvaldo Shipper, MD 12/04/13 [phone]

## 2013-12-05 ENCOUNTER — Ambulatory Visit (INDEPENDENT_AMBULATORY_CARE_PROVIDER_SITE_OTHER): Payer: Medicare Other | Admitting: Adult Health

## 2013-12-05 ENCOUNTER — Encounter: Payer: Self-pay | Admitting: Adult Health

## 2013-12-05 VITALS — BP 120/66 | HR 81 | Temp 98.2°F | Ht 72.0 in | Wt 176.0 lb

## 2013-12-05 DIAGNOSIS — J449 Chronic obstructive pulmonary disease, unspecified: Secondary | ICD-10-CM

## 2013-12-05 MED ORDER — TIOTROPIUM BROMIDE MONOHYDRATE 2.5 MCG/ACT IN AERS: 2.0000 | INHALATION_SPRAY | RESPIRATORY_TRACT | Status: DC

## 2013-12-05 NOTE — Patient Instructions (Addendum)
Follow med calendar and bring to each visit.  Continue on current regimen .  Follow up Dr. Melvyn Novas  In 6 weeks and As needed

## 2013-12-08 ENCOUNTER — Other Ambulatory Visit (INDEPENDENT_AMBULATORY_CARE_PROVIDER_SITE_OTHER): Payer: Medicare Other

## 2013-12-08 DIAGNOSIS — Z79899 Other long term (current) drug therapy: Secondary | ICD-10-CM

## 2013-12-08 LAB — COMPREHENSIVE METABOLIC PANEL
ALT: 12 U/L (ref 0–53)
AST: 17 U/L (ref 0–37)
Albumin: 3.4 g/dL — ABNORMAL LOW (ref 3.5–5.2)
Alkaline Phosphatase: 80 U/L (ref 39–117)
BUN: 22 mg/dL (ref 6–23)
CO2: 28 mEq/L (ref 19–32)
Calcium: 9 mg/dL (ref 8.4–10.5)
Chloride: 102 mEq/L (ref 96–112)
Creatinine, Ser: 1.3 mg/dL (ref 0.4–1.5)
GFR: 55.38 mL/min — ABNORMAL LOW (ref >60.00)
Glucose, Bld: 121 mg/dL — ABNORMAL HIGH (ref 70–99)
Potassium: 3.9 mEq/L (ref 3.5–5.1)
Sodium: 141 mEq/L (ref 135–145)
Total Bilirubin: 0.5 mg/dL (ref 0.3–1.2)
Total Protein: 6.6 g/dL (ref 6.0–8.3)

## 2013-12-10 NOTE — Assessment & Plan Note (Signed)
Compensated on present regimen  Patient's medications were reviewed today and patient education was given. Computerized medication calendar was adjusted/completed   Plan  Follow med calendar and bring to each visit.  Continue on current regimen .  Follow up Dr. Melvyn Novas  In 6 weeks and As needed

## 2013-12-10 NOTE — Progress Notes (Signed)
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 Pulmonary Consult    Referred per Dr. Kathyrn Lass. Pt c/o cough and SOB for the past 10 days. He states cough is prod with large amounts of yellow sputum. He gets SOB walking to the mailbox and back. He uses ventolin for rescue approx 4 times per day.   cough bad esp in am rx pred and abx no better so far On symbicort 160 2bid and still feels need for saba qid rec Add spiriva 2 puffs each am Continue symbicort Take 2 puffs first thing in am and then another 2 puffs about 12 hours later.  Prednisone 10 mg take  4 each am x 2 days,   2 each am x 2 days,  1 each am x 2 days and stop  Levaquin x 7 days 500 mg one daily  For cough mucinex dm 1200 mg every 12 hours and try prilosec 20mg   Take 30-60 min before first meal of the day and Pepcid 20 mg one bedtime  GERD diet . Needs spirometry and address microcytic anemia on return > pt reports has thalassemia    11/13/2013 f/u ov/[facility] re:  Chief Complaint  Patient presents with  . Follow-up    SOB and cough are improving. No new co's today. Using rescue inhaler approx 3 times per day.     Sill using fish oil Cough better still some nasty mucus but much less and does not disturb sleep Doe x mailbox is 200 yards slt uphill back and sometimes stop half way  >>d/c fish oil   12/05/13 Follow up and Med Calendar  Returns for follow up and med review  We reviewed all her meds and organized them into a med calendar with pt education .   Denies any breathing  complaints at this time.   Feels cough is better.  Remains on symbicort and spiriva .     Current Medications, Allergies, Complete Past Medical History, Past Surgical History, Family History, and Social History were reviewed in [employer] record.  ROS  The following are not active complaints unless bolded sore throat, dysphagia, dental problems, itching, sneezing,  nasal congestion or excess/ purulent secretions, ear ache,   fever, chills, sweats, unintended wt loss, pleuritic or exertional cp, hemoptysis,  orthopnea pnd or leg swelling, presyncope, palpitations, heartburn, abdominal pain, anorexia, nausea, vomiting, diarrhea  or change in bowel or urinary habits, change in stools or urine, dysuria,hematuria,  rash, arthralgias, visual complaints, headache, numbness weakness or ataxia or problems with walking or coordination,  change in mood/affect or memory.              Objective:   Physical Exam  11/13/2013          176>176 12/05/13     HEENT mild turbinate edema.  Oropharynx no thrush or excess pnd or cobblestoning.  No JVD or cervical adenopathy. Mild accessory muscle hypertrophy. Trachea midline, nl thryroid. Chest was hyperinflated by percussion with  diminished breath sounds and moderate increased exp time without wheeze. Hoover sign positive at mid inspiration. Regular rate and rhythm without murmur gallop or rub or increase P2 or edema.  Abd: no hsm, nl excursion. Ext warm without cyanosis or clubbing.    cxr 10/20/13 c/w copd, no acute changes Labs 10/30/13 Lab Results  Component Value Date   PROBNP 398.0* 10/30/2013   cbc ok though mcv chronically around 67 hc03 ok    Assessment & Plan:

## 2013-12-10 NOTE — Addendum Note (Signed)
Addended by: Parke Poisson E on: 12/10/2013 10:39 AM   Modules accepted: Orders

## 2013-12-12 ENCOUNTER — Ambulatory Visit (INDEPENDENT_AMBULATORY_CARE_PROVIDER_SITE_OTHER): Payer: Medicare Other

## 2013-12-12 VITALS — BP 148/69 | HR 64 | Resp 12

## 2013-12-12 DIAGNOSIS — G609 Hereditary and idiopathic neuropathy, unspecified: Secondary | ICD-10-CM

## 2013-12-12 DIAGNOSIS — G629 Polyneuropathy, unspecified: Secondary | ICD-10-CM

## 2013-12-12 DIAGNOSIS — Q828 Other specified congenital malformations of skin: Secondary | ICD-10-CM

## 2013-12-12 DIAGNOSIS — M21379 Foot drop, unspecified foot: Secondary | ICD-10-CM

## 2013-12-12 DIAGNOSIS — R269 Unspecified abnormalities of gait and mobility: Secondary | ICD-10-CM

## 2013-12-12 DIAGNOSIS — M216X9 Other acquired deformities of unspecified foot: Secondary | ICD-10-CM

## 2013-12-12 NOTE — Progress Notes (Signed)
   Subjective:    Patient ID: [name], male    DOB: 1924-05-01, 80 y.o.   MRN: [id number]  HPI PT STATED RT BOTTOM OF THE FOOT HAVE A HARD KNOT AND ITS BEEN SORE FOR 6 MONTHS. THE FOOT IS GETTING WORSE. THE FOOT GET AGGRAVATED BY WALKING AND PRESSURE. TRIED NO TREATMENT.    Review of Systems  Cardiovascular: Positive for leg swelling.  Genitourinary: Positive for frequency.  Hematological: Bruises/bleeds easily.  All other systems reviewed and are negative.      Objective:   Physical Exam Lower extremity objective findings as follows patient has pedal pulses palpable DP postal for PT plus one over 4 bilateral capillary refill time 3 seconds all digits plus one edema the right noted no edema noted left. Neurologically epicritic and proprioceptive sensations diminished on right-sided patient dropfoot deformity secondary to back surgery years ago. Patient does wear an AFO brace on the right side left foot is unremarkable both feet is rectus slight varus deformity right foot during weightbearing and ambulation there is hemorrhage a keratotic lesion sub-fifth MTP area right no active bleeding or discharge or drainage is noted however dry eschar tissue is evident. Painful tender symptomatic again patient does have decreased sensation or abnormal sensation and motor function to dropfoot deformity. Orthopedic exam otherwise unremarkable mild plantar flexed fifth metatarsal no x-rays taken at this time. Dermatologically nails otherwise unremarkable patient has mild varicosities otherwise rectus foot and intact vascular status       Assessment & Plan:  Assessment this time is pre-ulcerative keratotic lesion report keratoses secondary plantarflexed metatarsal and T. abnormality with dropfoot deformity and peripheral neuropathy the criptotic lesion is debrided down to a pinpoint bleeding treatment with lumicain Neosporin and Band-Aid dressing maintain Neosporin and Band-Aid dressing for couple days also  at this time a pocket was placed in the insole of his right shoe maintain that pocketing to accommodate the deformity or plantar flexed metatarsal prevent recurrence of keratoses return in the future and as-needed basis for followup  Harriet Masson DPM

## 2013-12-12 NOTE — Patient Instructions (Signed)

## 2013-12-21 ENCOUNTER — Other Ambulatory Visit: Payer: Self-pay | Admitting: Cardiology

## 2014-01-16 ENCOUNTER — Ambulatory Visit: Payer: Medicare Other | Attending: Family Medicine | Admitting: Rehabilitative and Restorative Service Providers"

## 2014-01-16 ENCOUNTER — Ambulatory Visit: Payer: Medicare Other | Admitting: Internal Medicine

## 2014-01-16 DIAGNOSIS — M6281 Muscle weakness (generalized): Secondary | ICD-10-CM | POA: Diagnosis not present

## 2014-01-16 DIAGNOSIS — R269 Unspecified abnormalities of gait and mobility: Secondary | ICD-10-CM | POA: Diagnosis not present

## 2014-01-16 DIAGNOSIS — IMO0001 Reserved for inherently not codable concepts without codable children: Secondary | ICD-10-CM | POA: Diagnosis not present

## 2014-01-19 ENCOUNTER — Ambulatory Visit: Payer: Medicare Other | Admitting: Rehabilitative and Restorative Service Providers"

## 2014-01-19 DIAGNOSIS — IMO0001 Reserved for inherently not codable concepts without codable children: Secondary | ICD-10-CM | POA: Diagnosis not present

## 2014-01-22 ENCOUNTER — Ambulatory Visit: Payer: Medicare Other | Admitting: Physical Therapy

## 2014-01-22 DIAGNOSIS — IMO0001 Reserved for inherently not codable concepts without codable children: Secondary | ICD-10-CM | POA: Diagnosis not present

## 2014-01-23 ENCOUNTER — Ambulatory Visit (INDEPENDENT_AMBULATORY_CARE_PROVIDER_SITE_OTHER): Payer: Medicare Other | Admitting: Internal Medicine

## 2014-01-23 ENCOUNTER — Encounter: Payer: Self-pay | Admitting: Internal Medicine

## 2014-01-23 VITALS — BP 120/60 | HR 65 | Temp 98.0°F | Ht 72.0 in | Wt 179.6 lb

## 2014-01-23 DIAGNOSIS — R05 Cough: Secondary | ICD-10-CM

## 2014-01-23 DIAGNOSIS — J4489 Other specified chronic obstructive pulmonary disease: Secondary | ICD-10-CM

## 2014-01-23 DIAGNOSIS — R059 Cough, unspecified: Secondary | ICD-10-CM

## 2014-01-23 DIAGNOSIS — J449 Chronic obstructive pulmonary disease, unspecified: Secondary | ICD-10-CM

## 2014-01-23 MED ORDER — BUDESONIDE-FORMOTEROL FUMARATE 160-4.5 MCG/ACT IN AERO: INHALATION_SPRAY | RESPIRATORY_TRACT | Status: DC

## 2014-01-23 MED ORDER — ALBUTEROL SULFATE HFA 108 (90 BASE) MCG/ACT IN AERS: 2.0000 | INHALATION_SPRAY | RESPIRATORY_TRACT | Status: DC | PRN

## 2014-01-23 NOTE — Progress Notes (Signed)
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 Pulmonary Consult    Referred per Dr. Kathyrn Lass. Pt c/o cough and SOB for the past 10 days. He states cough is prod with large amounts of yellow sputum. He gets SOB walking to the mailbox and back. He uses ventolin for rescue approx 4 times per day.   cough bad esp in am rx pred and abx no better so far On symbicort 160 2bid and still feels need for saba qid rec Add spiriva 2 puffs each am Continue symbicort Take 2 puffs first thing in am and then another 2 puffs about 12 hours later.  Prednisone 10 mg take  4 each am x 2 days,   2 each am x 2 days,  1 each am x 2 days and stop  Levaquin x 7 days 500 mg one daily  For cough mucinex dm 1200 mg every 12 hours and try prilosec 20mg   Take 30-60 min before first meal of the day and Pepcid 20 mg one bedtime  GERD diet .  microcytic anemia detected  > pt reports has thalassemia and Dr Harrington Challenger aware   11/13/2013 f/u ov/[name] re: GOLD III COPD  Chief Complaint  Patient presents with  . Follow-up    SOB and cough are improving. No new co's today. Using rescue inhaler approx 3 times per day.     Sill using fish oil Cough better still some nasty mucus but much less and does not disturb sleep Doe x mailbox is 200 yards slt uphill back and sometimes stop half way  >>d/c fish oil   12/05/13 Follow up and Med Calendar  Returns for follow up and med review  We reviewed all her meds and organized them into a med calendar with pt education .   Denies any breathing  complaints at this time.   Feels cough is better.  Remains on symbicort and spiriva .  rec Follow med calendar and bring to each visit.  Continue on current regimen   01/23/2014 f/u ov/[name] re: just on symbicort and not rinsing/ no med calendar/ no saba need  Chief Complaint  Patient presents with  . Follow-up    Pt states that his cough has resolved, but c/o hoarseness for the past month.  He states that his breathing is back to his normal baseline.   some bruising on asa. Not limited by breathing from desired activities   No obvious day to day or daytime variabilty or assoc chronic cough or cp or chest tightness, subjective wheeze overt sinus or hb symptoms. No unusual exp hx or h/o childhood pna/ asthma or knowledge of premature birth.  Sleeping ok without nocturnal  or early am exacerbation  of respiratory  c/o's or need for noct saba. Also denies any obvious fluctuation of symptoms with weather or environmental changes or other aggravating or alleviating factors except as outlined above   Current Medications, Allergies, Complete Past Medical History, Past Surgical History, Family History, and Social  History were reviewed in [facility] record.  ROS  The following are not active complaints unless bolded sore throat, dysphagia, dental problems, itching, sneezing,  nasal congestion or excess/ purulent secretions, ear ache,   fever, chills, sweats, unintended wt loss, pleuritic or exertional cp, hemoptysis,  orthopnea pnd or leg swelling, presyncope, palpitations, heartburn, abdominal pain, anorexia, nausea, vomiting, diarrhea  or change in bowel or urinary habits, change in stools or urine, dysuria,hematuria,  rash, arthralgias, visual complaints, headache, numbness weakness or ataxia or problems with walking or coordination,  change in mood/affect or memory.                         Objective:   Physical Exam  11/13/2013          176>176 12/05/13 >  01/23/2014  180     HEENT mild turbinate edema.  Oropharynx no thrush or excess pnd or cobblestoning.  No JVD or cervical adenopathy. Mild accessory muscle hypertrophy. Trachea midline, nl thryroid. Chest was hyperinflated by percussion with diminished breath sounds and moderate increased exp time without wheeze. Hoover sign positive at mid inspiration. Regular rate and rhythm without murmur gallop or rub or increase P2 or edema.  Abd: no hsm, nl excursion. Ext warm without cyanosis or clubbing.      cxr 10/20/13 c/w copd, no acute changes Labs 10/30/13 Lab Results  Component Value Date   PROBNP 398.0* 10/30/2013  CBC ok though mcv chronically around 67 hc03 ok    Assessment & Plan:

## 2014-01-23 NOTE — Patient Instructions (Addendum)
Leave off spiriva (think of it like high octane fuel)  Continue symbicort 160 Take 2 puffs first thing in am and then another 2 puffs about 12 hours later.  Rinse and gargle after use  Only use your albuterol as a rescue medication to be used if you can't catch your breath by resting or doing a relaxed purse lip breathing pattern.  - The less you use it, the better it will work when you need it. - Ok to use up to 2 puffs  every 4 hours if you must but call for immediate appointment if use goes up over your usual need - Don't leave home without it !!  (think of it like the spare tire for your car)    If you are satisfied with your treatment plan let your doctor know and he/she can either refill your medications or you can return here when your prescription runs out.     If in any way you are not 100% satisfied,  please tell us.  If 100% better, tell your friends!    Pulmonary follow up is as needed

## 2014-01-23 NOTE — Assessment & Plan Note (Signed)
-   sinus CT  11/13/2013 > Mild mucosal edema in the paranasal sinuses bilaterally including  maxillary sinus bilaterally, ethmoid sinuses, and right frontal  sinus. Negative for air-fluid level. No acute bony change. No soft  tissue lesion. - try off fish oil 11/13/13   Resolved to his satisfaction

## 2014-01-23 NOTE — Assessment & Plan Note (Addendum)
-   spirometry 11/13/2013 >  FEV1  1.46 (45%) ratio 52 - 11/13/2013  Walked RA x 3 laps @ 185 ft each stopped due to  End of study no desats - 11/13/2013 p extensive coaching HFA effectiveness =    75%  -12/05/13 Med calendar created > not using 01/23/2014   Adequate control on present rx, reviewed > no change in rx needed  And doubt he's really benefiting from spiriva so ok to d/c but monitor ex tol off it and resume if ex tol declines  Needs to remember to rinse and gargle after use of ICS >   Each maintenance medication was reviewed in detail including most importantly the difference between maintenance and as needed and under what circumstances the prns are to be used.  Please see instructions for details which were reviewed in writing and the patient given a copy.

## 2014-01-26 ENCOUNTER — Encounter: Payer: Self-pay | Admitting: Family

## 2014-01-27 ENCOUNTER — Ambulatory Visit (HOSPITAL_COMMUNITY)
Admission: RE | Admit: 2014-01-27 | Discharge: 2014-01-27 | Disposition: A | Discharge status: Home / Self Care | Payer: Medicare Other | Source: Ambulatory Visit | Attending: Family | Admitting: Family

## 2014-01-27 ENCOUNTER — Ambulatory Visit (INDEPENDENT_AMBULATORY_CARE_PROVIDER_SITE_OTHER): Payer: Medicare Other | Admitting: Family

## 2014-01-27 ENCOUNTER — Ambulatory Visit: Payer: Medicare Other | Admitting: Physical Therapy

## 2014-01-27 ENCOUNTER — Encounter: Payer: Self-pay | Admitting: Family

## 2014-01-27 ENCOUNTER — Other Ambulatory Visit: Payer: Self-pay | Admitting: Family

## 2014-01-27 VITALS — BP 124/63 | HR 63 | Resp 16 | Ht 72.0 in | Wt 176.0 lb

## 2014-01-27 DIAGNOSIS — Z48812 Encounter for surgical aftercare following surgery on the circulatory system: Secondary | ICD-10-CM

## 2014-01-27 DIAGNOSIS — I739 Peripheral vascular disease, unspecified: Secondary | ICD-10-CM

## 2014-01-27 DIAGNOSIS — I6529 Occlusion and stenosis of unspecified carotid artery: Secondary | ICD-10-CM

## 2014-01-27 DIAGNOSIS — I658 Occlusion and stenosis of other precerebral arteries: Secondary | ICD-10-CM | POA: Insufficient documentation

## 2014-01-27 DIAGNOSIS — IMO0001 Reserved for inherently not codable concepts without codable children: Secondary | ICD-10-CM | POA: Diagnosis not present

## 2014-01-27 NOTE — Patient Instructions (Signed)
Stroke Prevention Some medical conditions and behaviors are associated with an increased chance of having a stroke. You may prevent a stroke by making healthy choices and managing medical conditions. HOW CAN I REDUCE MY RISK OF HAVING A STROKE?   Stay physically active. Get at least 30 minutes of activity on most or all days.  Do not smoke. It may also be helpful to avoid exposure to secondhand smoke.  Limit alcohol use. Moderate alcohol use is considered to be:  No more than 2 drinks per day for men.  No more than 1 drink per day for nonpregnant women.  Eat healthy foods. This involves  Eating 5 or more servings of fruits and vegetables a day.  Following a diet that addresses high blood pressure (hypertension), high cholesterol, diabetes, or obesity.  Manage your cholesterol levels.  A diet low in saturated fat, trans fat, and cholesterol and high in fiber may control cholesterol levels.  Take any prescribed medicines to control cholesterol as directed by your health care provider.  Manage your diabetes.  A controlled-carbohydrate, controlled-sugar diet is recommended to manage diabetes.  Take any prescribed medicines to control diabetes as directed by your health care provider.  Control your hypertension.  A low-salt (sodium), low-saturated fat, low-trans fat, and low-cholesterol diet is recommended to manage hypertension.  Take any prescribed medicines to control hypertension as directed by your health care provider.  Maintain a healthy weight.  A reduced-calorie, low-sodium, low-saturated fat, low-trans fat, low-cholesterol diet is recommended to manage weight.  Stop drug abuse.  Avoid taking birth control pills.  Talk to your health care provider about the risks of taking birth control pills if you are over 26 years old, smoke, get migraines, or have ever had a blood clot.  Get evaluated for sleep disorders (sleep apnea).  Talk to your health care provider about  getting a sleep evaluation if you snore a lot or have excessive sleepiness.  Take medicines as directed by your health care provider.  For some people, aspirin or blood thinners (anticoagulants) are helpful in reducing the risk of forming abnormal blood clots that can lead to stroke. If you have the irregular heart rhythm of atrial fibrillation, you should be on a blood thinner unless there is a good reason you cannot take them.  Understand all your medicine instructions.  Make sure that other other conditions (such as anemia or atherosclerosis) are addressed. SEEK IMMEDIATE MEDICAL CARE IF:   You have sudden weakness or numbness of the face, arm, or leg, especially on one side of the body.  Your face or eyelid droops to one side.  You have sudden confusion.  You have trouble speaking (aphasia) or understanding.  You have sudden trouble seeing in one or both eyes.  You have sudden trouble walking.  You have dizziness.  You have a loss of balance or coordination.  You have a sudden, severe headache with no known cause.  You have new chest pain or an irregular heartbeat. Any of these symptoms may represent a serious problem that is an emergency. Do not wait to see if the symptoms will go away. Get medical help at once. Call your local emergency services  (911 in [place] Do not drive yourself to the hospital. Document Released: 09/07/2004 Document Revised: 05/21/2013 Document Reviewed: 01/31/2013 [facility] Patient Information 2014 [place].   Obtain 20-30 mm mercury knee high graduated compression hose if you do not have any. Wear during the day. Elevate legs to heart level when  not walking.

## 2014-01-27 NOTE — Progress Notes (Signed)
Established Carotid Patient   History of Present Illness  [name] is a 80 y.o. male patient that Dr. Donnetta Hutching saw in March, 2014 for referral from patient's opthalmologist for plaque seen in his right eye.  Dr. Donnetta Hutching had performed a right carotid endarterectomy about 15 years prior. The patient had no focal neurologic deficits at that time. He is left-handed.  On a carotid Duplex performed in an outside facility prior to Dr. Donnetta Hutching seeing patient in March of 2014, moderate left internal carotid artery stenosis was noted  with widely patent carotid endarterectomy site.  Dr. Donnetta Hutching explained to patient and wife in March that this is a relative indication for endarterectomy with moderate disease.  81 mg daily ASA was suggested since he was having some bruising with 325 aspirin.   Patient denies history of any stroke or TIA but was told that he had a stroke prior to the right CEA.  He has chronic dizziness for several years, states his PCP is aware and has had evaluation of this and therapy for this which did not seem to help. He has fallen several times from the dizziness and hit his head.  The patient denies amaurosis fugax or monocular blindness. The patient denies facial drooping.  Pt. denies hemiplegia. The patient denies receptive or expressive aphasia. Pt. denies extremity weakness.  He denies steal symptoms in either arm.  Patient reports New Medical or Surgical History: he was diagnosed with COPD.  States he has slowly progressive Neuropathy in LE's that started about 1999, before his back problems.  States he was exposed to extreme cold in the [employer] in [place].  He also has right foot drop which he states is related to his low back issues, wears a brace on his right foot for this.  He had an MI in 1979 and has been attending cardiac rehab. Regularly since then.  He stumbles when he walks, legs "give out", he denies non healing wounds in feet or legs.  Pt Diabetic: Yes, "borderline"   Pt smoker: former smoker, quit 1979  Pt meds include:  Statin : No  ASA: Yes  Other anticoagulants/antiplatelets: no  Past Medical History  Diagnosis Date  . Carotid artery stenosis     without infarction  . Hypertension   . Chronic diastolic heart failure   . Edema   . Bronchitis, acute   . Mini stroke     20 yrs ago during cartodid surgery  . CHF (congestive heart failure)   . Myocardial infarction 1979    Dr Verl Blalock - Cardiologist  . Coronary artery disease     native vessel  . Shortness of breath     with exersition  . COPD (chronic obstructive pulmonary disease)     "mild"  . Sleep apnea     CPAP doesn't use know.  last tested > 5 years abd < 10..  . Stroke     "mild stroke Years ago"  . Cancer     prostate  . Arthritis   . Foot drop, right   . Neuromuscular disorder     neuropathy- LE  . Neuropathy     Social History History  Substance Use Topics  . Smoking status: Former Smoker -- 1.00 packs/day for 20 years    Types: Cigarettes    Quit date: 08/14/1977  . Smokeless tobacco: Never Used  . Alcohol Use: No     Comment: occasional    Family History Family History  Problem Relation Age of  Onset  . Thalassemia      Family history  . Cancer Mother 60    bone cancer  . Diabetes Mother   . Heart disease Mother   . Heart attack Mother   . Stroke Father 20  . Hypertension Sister   . Heart disease Brother   . Heart attack Brother 79    Surgical History Past Surgical History  Procedure Laterality Date  . Penile prosthesis implant    . Cholecystectomy    . Right carotid endarterectomy    . Back surgery    . Lumbar laminectomy  03/24/10    L3-4 and L4-5  . Ankle surgery      bone infection  . Cardiac catheterization    . Appendectomy    . Joint replacement      Left  . Radioactive seed implant    . Pars plana vitrectomy  03/19/2012    Procedure: PARS PLANA VITRECTOMY WITH 25 GAUGE;  Surgeon: Hayden Pedro, MD;  Location: [facility];  Service:  Ophthalmology;  Laterality: Right;  . Carotid endarterectomy Right     cea- 15 yrs ago  . Spine surgery      Allergies  Allergen Reactions  . Amoxicillin     REACTION: unspecified  . Clopidogrel Bisulfate Hives    REACTION: red blue, black blotches  . Dilaudid [Hydromorphone Hcl]   . Penicillins   . Sulfa Antibiotics   . Sulfamethoxazole     REACTION: unspecified  . Sulfonamide Derivatives     Current Outpatient Prescriptions  Medication Sig Dispense Refill  . albuterol (VENTOLIN HFA) 108 (90 BASE) MCG/ACT inhaler Inhale 2 puffs into the lungs every 4 (four) hours as needed for wheezing or shortness of breath.  1 Inhaler  11  . aspirin 81 MG tablet Take 81 mg by mouth daily.      . budesonide-formoterol (SYMBICORT) 160-4.5 MCG/ACT inhaler Take 2 puffs first thing in am and then another 2 puffs about 12 hours later.  1 Inhaler  11  . carvedilol (COREG) 12.5 MG tablet 1/2 tab by mouth twice daily      . dextromethorphan-guaiFENesin (MUCINEX DM) 30-600 MG per 12 hr tablet Take 1 tablet by mouth 2 (two) times daily as needed (cough, congestion).      . famotidine (PEPCID) 20 MG tablet Take 20 mg by mouth at bedtime.      . furosemide (LASIX) 20 MG tablet TAKE 1 TABLET BY MOUTH DAILY.  90 tablet  3  . gemfibrozil (LOPID) 600 MG tablet Take 600 mg by mouth 2 (two) times daily before a meal.      . meclizine (ANTIVERT) 25 MG tablet Take as directed when needed for dizziness      . mirabegron ER (MYRBETRIQ) 50 MG TB24 Take 50 mg by mouth at bedtime.       . nitroGLYCERIN (NITROSTAT) 0.4 MG SL tablet Place 1 tablet (0.4 mg total) under the tongue every 5 (five) minutes as needed. For chest pain  25 tablet  5  . omeprazole (PRILOSEC) 20 MG capsule Take 20 mg by mouth 2 (two) times daily before a meal.      . polyethylene glycol powder (MIRALAX) powder Take 17 g by mouth daily as needed. For constipation      . pregabalin (LYRICA) 75 MG capsule Take 75 mg by mouth daily.      . simvastatin  (ZOCOR) 10 MG tablet Take 1 tablet (10 mg total) by mouth daily.  [place]  tablet  6  . Vitamin D, Ergocalciferol, (DRISDOL) 50000 UNITS CAPS capsule Take 50,000 Units by mouth every Friday.       No current facility-administered medications for this visit.    Review of Systems : See HPI for pertinent positives and negatives.  Physical Examination   Filed Vitals:   01/27/14 1537 01/27/14 1540  BP: 119/57 124/63  Pulse: 64 63  Resp:  16  Height:  6' (1.829 m)  Weight:  176 lb (79.833 kg)  SpO2:  97%   Body mass index is 23.86 kg/(m^2).  General: WDWN male in NAD  GAIT: right foot drop corrected by brace  Eyes: PERRLA  Pulmonary: CTAB, Negative Rales, Negative rhonchi, & Negative wheezing.  Cardiac: regular Rhythm, Negative Murmurs.   VASCULAR EXAM  Carotid Bruits  Left  Right    Negative  Negative   Radial pulses are 1+ right and 2+ left.  Aorta is not palpable. LE Pulses  LEFT  RIGHT   POPLITEAL  palpable  palpable   PT 1+ palpable faintly palpable  DP 1+ palpable 1+ palpable   Gastrointestinal: soft, nontender, BS WNL, no r/g, negative masses.  Musculoskeletal: Negative muscle atrophy/wasting. M/S 5/5 throughout, Extremities without ischemic changes. Wearing brace on right lower leg to prevent foot drop. 2+ pretibial pitting edema in right leg, 1+ in left lower leg. Neurologic: A&O X 3; Appropriate Affect ; SENSATION ;normal;  Speech is normal  CN 2-12 intact, Pain and light touch intact in extremities, Motor exam as listed above.  Non-Invasive Vascular Imaging CAROTID DUPLEX 01/27/2014   CEREBROVASCULAR DUPLEX EVALUATION    INDICATION: Carotid stenosis    PREVIOUS INTERVENTION(S): Right carotid endarterectomy approximately 15 years ago    DUPLEX EXAM:     RIGHT  LEFT  Peak Systolic Velocities (cm/s) End Diastolic Velocities (cm/s) Plaque LOCATION Peak Systolic Velocities (cm/s) End Diastolic Velocities (cm/s) Plaque  91 18  CCA PROXIMAL 168 24 HT  82 17 HT CCA MID  130 23 HT  83 13 HT CCA DISTAL 153 20 HT  192 14 HT ECA 176 5 HT  101 22 HT ICA PROXIMAL 267 68 HT  112 26  ICA MID 242 46 HT  101 28  ICA DISTAL 100 26     carotid endarterectomy  ICA / CCA Ratio (PSV) 2.05  Antegrade Vertebral Flow Antegrade  761 Brachial Systolic Pressure (mmHg) 607  Triphasic Brachial Artery Waveforms Triphasic    Plaque Morphology:  HM = Homogeneous, HT = Heterogeneous, CP = Calcific Plaque, SP = Smooth Plaque, IP = Irregular Plaque     ADDITIONAL FINDINGS:     IMPRESSION: Right internal carotid artery is patent with history of carotid endarterectomy, mild heterogeneous plaque versus hyperplasia present at the endarterectomized segment suggestive of restenosis less than 40%. Left internal carotid artery stenosis present in the 60%-79% range. Bilateral external carotid artery stenosis present.    Compared to the previous exam:  Essentially unchanged since previous study on 07/22/2013.    Assessment: [name] is a 80 y.o. male who is s/p right CEA in about 1999. He presents with asymptomatic patent right ICA with history of carotid endarterectomy, mild heterogeneous plaque versus hyperplasia present at the endarterectomized segment suggestive of restenosis less than 40%. Left internal carotid artery stenosis present in the 60%-79% range. Bilateral external carotid artery stenosis present. Essentially unchanged since previous study on 07/22/2013. His legs feel progressively weaker, pedal pulses are palpable, feet appear well perfused.  Will nevertheless obtain ABI's  when he returns in 6 months for the carotid Duplex surveillance.  Plan: Follow-up in 6 months with Carotid Duplex scan and ABI's.  Knee high compression hose to wear during the day for pretibial pitting edema that mostly resolves over night.  I discussed in depth with the patient the nature of atherosclerosis, and emphasized the importance of maximal medical management including strict control of  blood pressure, blood glucose, and lipid levels, obtaining regular exercise, and continued cessation of smoking.  The patient is aware that without maximal medical management the underlying atherosclerotic disease process will progress, limiting the benefit of any interventions. The patient was given information about stroke prevention and what symptoms should prompt the patient to seek immediate medical care. Thank you for allowing [place] to participate in this patient's care.  Clemon Chambers, RN, MSN, FNP-C [facility] Office: [facility] Physician: Early  01/27/2014 3:58 PM

## 2014-01-29 ENCOUNTER — Ambulatory Visit: Payer: Medicare Other | Admitting: Physical Therapy

## 2014-01-29 DIAGNOSIS — IMO0001 Reserved for inherently not codable concepts without codable children: Secondary | ICD-10-CM | POA: Diagnosis not present

## 2014-02-03 ENCOUNTER — Ambulatory Visit: Payer: Medicare Other | Admitting: Rehabilitative and Restorative Service Providers"

## 2014-02-03 DIAGNOSIS — IMO0001 Reserved for inherently not codable concepts without codable children: Secondary | ICD-10-CM | POA: Diagnosis not present

## 2014-02-06 ENCOUNTER — Ambulatory Visit: Payer: Medicare Other | Admitting: Rehabilitative and Restorative Service Providers"

## 2014-02-06 DIAGNOSIS — IMO0001 Reserved for inherently not codable concepts without codable children: Secondary | ICD-10-CM | POA: Diagnosis not present

## 2014-02-10 ENCOUNTER — Ambulatory Visit: Payer: Medicare Other | Admitting: Rehabilitative and Restorative Service Providers"

## 2014-02-10 DIAGNOSIS — IMO0001 Reserved for inherently not codable concepts without codable children: Secondary | ICD-10-CM | POA: Diagnosis not present

## 2014-02-12 ENCOUNTER — Ambulatory Visit: Payer: Medicare Other | Attending: Family Medicine | Admitting: Physical Therapy

## 2014-02-12 DIAGNOSIS — R269 Unspecified abnormalities of gait and mobility: Secondary | ICD-10-CM | POA: Diagnosis not present

## 2014-02-12 DIAGNOSIS — IMO0001 Reserved for inherently not codable concepts without codable children: Secondary | ICD-10-CM | POA: Insufficient documentation

## 2014-02-12 DIAGNOSIS — M6281 Muscle weakness (generalized): Secondary | ICD-10-CM | POA: Insufficient documentation

## 2014-02-17 ENCOUNTER — Ambulatory Visit: Payer: Medicare Other | Admitting: Rehabilitative and Restorative Service Providers"

## 2014-02-17 DIAGNOSIS — IMO0001 Reserved for inherently not codable concepts without codable children: Secondary | ICD-10-CM | POA: Diagnosis not present

## 2014-02-19 ENCOUNTER — Encounter: Payer: Medicare Other | Admitting: Rehabilitative and Restorative Service Providers"

## 2014-02-23 ENCOUNTER — Encounter: Payer: Medicare Other | Admitting: Rehabilitative and Restorative Service Providers"

## 2014-02-24 ENCOUNTER — Encounter: Payer: Medicare Other | Admitting: Rehabilitative and Restorative Service Providers"

## 2014-02-27 ENCOUNTER — Encounter: Payer: Medicare Other | Admitting: Rehabilitative and Restorative Service Providers"

## 2014-03-03 ENCOUNTER — Encounter: Payer: Medicare Other | Admitting: Physical Therapy

## 2014-03-05 ENCOUNTER — Encounter: Payer: Medicare Other | Admitting: Rehabilitative and Restorative Service Providers"

## 2014-03-10 ENCOUNTER — Encounter: Payer: Medicare Other | Admitting: Rehabilitative and Restorative Service Providers"

## 2014-03-12 ENCOUNTER — Encounter: Payer: Medicare Other | Admitting: Rehabilitative and Restorative Service Providers"

## 2014-03-23 ENCOUNTER — Ambulatory Visit: Payer: Medicare Other | Attending: Family Medicine | Admitting: Rehabilitative and Restorative Service Providers"

## 2014-03-23 DIAGNOSIS — IMO0001 Reserved for inherently not codable concepts without codable children: Secondary | ICD-10-CM | POA: Diagnosis present

## 2014-03-23 DIAGNOSIS — R269 Unspecified abnormalities of gait and mobility: Secondary | ICD-10-CM | POA: Insufficient documentation

## 2014-03-23 DIAGNOSIS — M6281 Muscle weakness (generalized): Secondary | ICD-10-CM | POA: Diagnosis not present

## 2014-04-09 ENCOUNTER — Ambulatory Visit: Payer: Medicare Other | Admitting: Rehabilitative and Restorative Service Providers"

## 2014-04-09 DIAGNOSIS — IMO0001 Reserved for inherently not codable concepts without codable children: Secondary | ICD-10-CM | POA: Diagnosis not present

## 2014-04-10 ENCOUNTER — Ambulatory Visit (INDEPENDENT_AMBULATORY_CARE_PROVIDER_SITE_OTHER): Payer: Medicare Other | Admitting: Ophthalmology

## 2014-04-10 DIAGNOSIS — H35039 Hypertensive retinopathy, unspecified eye: Secondary | ICD-10-CM

## 2014-04-10 DIAGNOSIS — I10 Essential (primary) hypertension: Secondary | ICD-10-CM

## 2014-04-10 DIAGNOSIS — H43819 Vitreous degeneration, unspecified eye: Secondary | ICD-10-CM

## 2014-04-10 DIAGNOSIS — H35379 Puckering of macula, unspecified eye: Secondary | ICD-10-CM

## 2014-04-10 DIAGNOSIS — S0550XA Penetrating wound with foreign body of unspecified eyeball, initial encounter: Secondary | ICD-10-CM

## 2014-04-10 DIAGNOSIS — H26499 Other secondary cataract, unspecified eye: Secondary | ICD-10-CM

## 2014-04-15 ENCOUNTER — Ambulatory Visit: Payer: Medicare Other | Admitting: Rehabilitative and Restorative Service Providers"

## 2014-04-23 ENCOUNTER — Ambulatory Visit (INDEPENDENT_AMBULATORY_CARE_PROVIDER_SITE_OTHER): Payer: Medicare Other | Admitting: Ophthalmology

## 2014-04-23 DIAGNOSIS — H26499 Other secondary cataract, unspecified eye: Secondary | ICD-10-CM

## 2014-05-06 ENCOUNTER — Ambulatory Visit (INDEPENDENT_AMBULATORY_CARE_PROVIDER_SITE_OTHER): Payer: Medicare Other | Admitting: Ophthalmology

## 2014-05-20 ENCOUNTER — Ambulatory Visit (INDEPENDENT_AMBULATORY_CARE_PROVIDER_SITE_OTHER): Payer: Medicare Other | Admitting: Ophthalmology

## 2014-05-20 DIAGNOSIS — H2702 Aphakia, left eye: Secondary | ICD-10-CM

## 2014-06-05 ENCOUNTER — Other Ambulatory Visit: Payer: Self-pay | Admitting: *Deleted

## 2014-06-05 MED ORDER — SIMVASTATIN 10 MG PO TABS: 10.0000 mg | ORAL_TABLET | Freq: Every day | ORAL | Status: DC

## 2014-06-08 ENCOUNTER — Other Ambulatory Visit: Payer: Self-pay

## 2014-06-08 MED ORDER — SIMVASTATIN 10 MG PO TABS: 10.0000 mg | ORAL_TABLET | Freq: Every day | ORAL | Status: DC

## 2014-07-20 ENCOUNTER — Encounter (INDEPENDENT_AMBULATORY_CARE_PROVIDER_SITE_OTHER): Payer: Medicare Other | Admitting: Ophthalmology

## 2014-07-20 DIAGNOSIS — H35372 Puckering of macula, left eye: Secondary | ICD-10-CM

## 2014-08-11 ENCOUNTER — Encounter (HOSPITAL_COMMUNITY): Payer: Medicare Other

## 2014-08-11 ENCOUNTER — Other Ambulatory Visit (HOSPITAL_COMMUNITY): Payer: Medicare Other

## 2014-08-11 ENCOUNTER — Ambulatory Visit: Payer: Medicare Other | Admitting: Family

## 2014-08-11 ENCOUNTER — Encounter: Payer: Self-pay | Admitting: Family

## 2014-08-18 ENCOUNTER — Ambulatory Visit (INDEPENDENT_AMBULATORY_CARE_PROVIDER_SITE_OTHER): Payer: Medicare Other | Admitting: Family

## 2014-08-18 ENCOUNTER — Ambulatory Visit (HOSPITAL_COMMUNITY)
Admission: RE | Admit: 2014-08-18 | Discharge: 2014-08-18 | Disposition: A | Discharge status: Home / Self Care | Payer: Medicare Other | Source: Ambulatory Visit | Attending: Family | Admitting: Family

## 2014-08-18 ENCOUNTER — Ambulatory Visit (INDEPENDENT_AMBULATORY_CARE_PROVIDER_SITE_OTHER)
Admission: RE | Admit: 2014-08-18 | Discharge: 2014-08-18 | Disposition: A | Discharge status: Home / Self Care | Payer: Medicare Other | Source: Ambulatory Visit | Attending: Family | Admitting: Family

## 2014-08-18 ENCOUNTER — Encounter: Payer: Self-pay | Admitting: Family

## 2014-08-18 VITALS — BP 126/67 | HR 60 | Resp 16 | Ht 72.0 in | Wt 176.0 lb

## 2014-08-18 DIAGNOSIS — I6523 Occlusion and stenosis of bilateral carotid arteries: Secondary | ICD-10-CM

## 2014-08-18 DIAGNOSIS — I739 Peripheral vascular disease, unspecified: Secondary | ICD-10-CM | POA: Diagnosis present

## 2014-08-18 DIAGNOSIS — G609 Hereditary and idiopathic neuropathy, unspecified: Secondary | ICD-10-CM

## 2014-08-18 DIAGNOSIS — Z48812 Encounter for surgical aftercare following surgery on the circulatory system: Secondary | ICD-10-CM

## 2014-08-18 DIAGNOSIS — Z9889 Other specified postprocedural states: Secondary | ICD-10-CM

## 2014-08-18 NOTE — Progress Notes (Signed)
[facility] HISTORY AND PHYSICAL   MRN : [id number]  History of Present Illness:   [name] is a 81 y.o. male patient that Dr. Donnetta Hutching saw in March, 2014 for referral from patient's opthalmologist re plaque seen in his right eye.  He returns today for carotid artery surveillance and ABI's. Dr. Donnetta Hutching had performed a right carotid endarterectomy about 15 years prior. The patient had no focal neurologic deficits at that time. He is left-handed.  On a carotid Duplex performed in an outside facility prior to Dr. Donnetta Hutching seeing patient in March of 2014, moderate left internal carotid artery stenosis was noted  with widely patent carotid endarterectomy site.  Dr. Donnetta Hutching explained to patient and wife at that time that this is a relative indication for endarterectomy with moderate disease.  81 mg daily ASA was suggested since he was having some bruising with 325 aspirin.  He leads cardiac rehab exercises 3 days/week.  Patient denies history of any stroke or TIA but was told that he had a stroke prior to the right CEA.  He has chronic dizziness for several years, states his PCP is aware and has had evaluation of this and therapy for this which did not seem to help. He has fallen several times from the dizziness and hit his head.  The patient denies amaurosis fugax or monocular blindness. The patient denies facial drooping.  Pt. denies hemiplegia. The patient denies receptive or expressive aphasia. Pt. denies extremity weakness.  He denies steal symptoms in either arm.  Patient reports New Medical or Surgical History: he was diagnosed with COPD.  States he has slowly progressive Neuropathy in LE's that started about 1999, before his back problems, has had 2 lumbar spine surgeries.  States he was exposed to extreme cold in the [employer] in [place].  He also has right foot drop which he states is related to his low back issues, wears a brace on his right foot for  this.  He had an MI in 1979 and has been attending cardiac rehab regularly since then.  He stumbles when he walks, legs "give out", this is worsening, he denies non healing wounds in feet or legs.  Pt Diabetic: Yes, "borderline"  Pt smoker: former smoker, quit 1979  Pt meds include:  Statin : No  ASA: Yes  Other anticoagulants/antiplatelets: no    Current Outpatient Prescriptions  Medication Sig Dispense Refill  . acetaminophen (TYLENOL) 325 MG tablet Take 650 mg by mouth as needed.    Marland Kitchen albuterol (VENTOLIN HFA) 108 (90 BASE) MCG/ACT inhaler Inhale 2 puffs into the lungs every 4 (four) hours as needed for wheezing or shortness of breath. 1 Inhaler 11  . aspirin 81 MG tablet Take 81 mg by mouth daily.    . budesonide-formoterol (SYMBICORT) 160-4.5 MCG/ACT inhaler Take 2 puffs first thing in am and then another 2 puffs about 12 hours later. 1 Inhaler 11  . carvedilol (COREG) 12.5 MG tablet 1/2 tab by mouth twice daily    . dextromethorphan-guaiFENesin (MUCINEX DM) 30-600 MG per 12 hr tablet Take 1 tablet by mouth 2 (two) times daily as needed (cough, congestion).    . famotidine (PEPCID) 20 MG tablet Take 20 mg by mouth at bedtime.    . furosemide (LASIX) 20 MG tablet TAKE 1 TABLET BY MOUTH DAILY. 90 tablet 3  . gemfibrozil (LOPID) 600 MG tablet Take 600 mg by mouth 2 (two) times daily before a meal.    .  meclizine (ANTIVERT) 25 MG tablet Take as directed when needed for dizziness    . mirabegron ER (MYRBETRIQ) 50 MG TB24 Take 50 mg by mouth at bedtime.     . nitroGLYCERIN (NITROSTAT) 0.4 MG SL tablet Place 1 tablet (0.4 mg total) under the tongue every 5 (five) minutes as needed. For chest pain 25 tablet 5  . omeprazole (PRILOSEC) 20 MG capsule Take 20 mg by mouth 2 (two) times daily before a meal.    . polyethylene glycol powder (MIRALAX) powder Take 17 g by mouth daily as needed. For constipation    . pregabalin (LYRICA) 75 MG capsule Take 75 mg by mouth daily.    . simvastatin  (ZOCOR) 10 MG tablet Take 1 tablet (10 mg total) by mouth daily. 30 tablet 6  . Vitamin D, Ergocalciferol, (DRISDOL) 50000 UNITS CAPS capsule Take 50,000 Units by mouth every Friday.     No current facility-administered medications for this visit.    Past Medical History  Diagnosis Date  . Carotid artery stenosis     without infarction  . Hypertension   . Chronic diastolic heart failure   . Edema   . Bronchitis, acute   . Mini stroke     20 yrs ago during cartodid surgery  . CHF (congestive heart failure)   . Myocardial infarction 1979    Dr Verl Blalock - Cardiologist  . Coronary artery disease     native vessel  . Shortness of breath     with exersition  . COPD (chronic obstructive pulmonary disease)     "mild"  . Sleep apnea     CPAP doesn't use know.  last tested > 5 years abd < 10..  . Stroke     "mild stroke Years ago"  . Cancer     prostate  . Arthritis   . Foot drop, right   . Neuromuscular disorder     neuropathy- LE  . Neuropathy   . Anemia     Social History History  Substance Use Topics  . Smoking status: Former Smoker -- 1.00 packs/day for 20 years    Types: Cigarettes    Quit date: 08/14/1977  . Smokeless tobacco: Never Used  . Alcohol Use: No     Comment: occasional    Family History Family History  Problem Relation Age of Onset  . Thalassemia      Family history  . Cancer Mother 59    bone cancer  . Diabetes Mother   . Heart disease Mother   . Heart attack Mother   . Hypertension Mother   . Stroke Father 65  . Heart disease Father   . Hypertension Father   . Hypertension Sister   . Diabetes Sister   . Heart disease Brother   . Heart attack Brother 44  . Cancer Brother     Surgical History Past Surgical History  Procedure Laterality Date  . Penile prosthesis implant    . Cholecystectomy    . Right carotid endarterectomy    . Back surgery    . Lumbar laminectomy  03/24/10    L3-4 and L4-5  . Ankle surgery      bone infection  .  Cardiac catheterization    . Appendectomy    . Joint replacement      Left  . Radioactive seed implant    . Pars plana vitrectomy  03/19/2012    Procedure: PARS PLANA VITRECTOMY WITH 25 GAUGE;  Surgeon: Hayden Pedro, MD;  Location: [facility];  Service: Ophthalmology;  Laterality: Right;  . Carotid endarterectomy Right     cea- 15 yrs ago  . Spine surgery      Allergies  Allergen Reactions  . Amoxicillin     REACTION: unspecified  . Clopidogrel Bisulfate Hives    REACTION: red blue, black blotches  . Dilaudid [Hydromorphone Hcl]   . Penicillins   . Sulfa Antibiotics   . Sulfamethoxazole     REACTION: unspecified  . Sulfonamide Derivatives     Current Outpatient Prescriptions  Medication Sig Dispense Refill  . acetaminophen (TYLENOL) 325 MG tablet Take 650 mg by mouth as needed.    Marland Kitchen albuterol (VENTOLIN HFA) 108 (90 BASE) MCG/ACT inhaler Inhale 2 puffs into the lungs every 4 (four) hours as needed for wheezing or shortness of breath. 1 Inhaler 11  . aspirin 81 MG tablet Take 81 mg by mouth daily.    . budesonide-formoterol (SYMBICORT) 160-4.5 MCG/ACT inhaler Take 2 puffs first thing in am and then another 2 puffs about 12 hours later. 1 Inhaler 11  . carvedilol (COREG) 12.5 MG tablet 1/2 tab by mouth twice daily    . dextromethorphan-guaiFENesin (MUCINEX DM) 30-600 MG per 12 hr tablet Take 1 tablet by mouth 2 (two) times daily as needed (cough, congestion).    . famotidine (PEPCID) 20 MG tablet Take 20 mg by mouth at bedtime.    . furosemide (LASIX) 20 MG tablet TAKE 1 TABLET BY MOUTH DAILY. 90 tablet 3  . gemfibrozil (LOPID) 600 MG tablet Take 600 mg by mouth 2 (two) times daily before a meal.    . meclizine (ANTIVERT) 25 MG tablet Take as directed when needed for dizziness    . mirabegron ER (MYRBETRIQ) 50 MG TB24 Take 50 mg by mouth at bedtime.     . nitroGLYCERIN (NITROSTAT) 0.4 MG SL tablet Place 1 tablet (0.4 mg total) under the tongue every 5 (five) minutes as needed. For  chest pain 25 tablet 5  . omeprazole (PRILOSEC) 20 MG capsule Take 20 mg by mouth 2 (two) times daily before a meal.    . polyethylene glycol powder (MIRALAX) powder Take 17 g by mouth daily as needed. For constipation    . pregabalin (LYRICA) 75 MG capsule Take 75 mg by mouth daily.    . simvastatin (ZOCOR) 10 MG tablet Take 1 tablet (10 mg total) by mouth daily. 30 tablet 6  . Vitamin D, Ergocalciferol, (DRISDOL) 50000 UNITS CAPS capsule Take 50,000 Units by mouth every Friday.     No current facility-administered medications for this visit.     REVIEW OF SYSTEMS: See HPI for pertinent positives and negatives.  Physical Examination Filed Vitals:   08/18/14 1439 08/18/14 1443  BP: 119/62 126/67  Pulse: 61 60  Resp:  16  Height:  6' (1.829 m)  Weight:  176 lb (79.833 kg)  SpO2:  97%   Body mass index is 23.86 kg/(m^2).  General: WDWN male in NAD  GAIT: right foot drop corrected by brace  Eyes: PERRLA  Pulmonary: CTAB, Negative Rales, Negative rhonchi, & Negative wheezing.  Cardiac: regular Rhythm, Negative Murmurs.   VASCULAR EXAM  Carotid Bruits  Left  Right    Negative  Negative   Radial pulses are 1+ right and 2+ left.  Aorta is not palpable. LE Pulses  LEFT  RIGHT   POPLITEAL  2+palpable  1+palpable   PT not palpable not palpable  DP not palpable 1+ palpable   Gastrointestinal:  soft, nontender, BS WNL, no r/g, no palpable masses.  Musculoskeletal: Negative muscle atrophy/wasting. M/S 5/5 throughout, Extremities without ischemic changes. Wearing brace on right lower leg to prevent foot drop. 2+ pretibial pitting edema in right leg, 1+ in left lower leg. Neurologic: A&O X 3; Appropriate Affect ; SENSATION ;normal;  Speech is normal  CN 2-12 intact, Pain and light touch intact in extremities, Motor exam as listed above.   Non-Invasive Vascular Imaging (08/18/2014):   Carotid Duplex: The right CEA site is patent with <40% hyperplasia in  the ICA; left ICA reveals 60-79% stenosis. Bilateral vertebral artery is antegrade with mild dampening of the left.  ABI (Date: 08/18/2014)  Bilateral ABI's could not be obtained due to non compressible vessels.  Waveforms on the right are are strongly monophasic/biphasic suggestive of mild-moderate arterial insufficiency.  Waveforms on the left are are biphasic/triphasic suggestive of mild arterial insufficiency.   Bilateral toe pressures are WNL.  No previous ABI's for comparison.    ASSESSMENT:  [name] is a 81 y.o. male who is s/p right CEA about 1999. He has had no stroke or TIA since 1999. On carotid Duplex today the right CEA site is patent with <40% hyperplasia in the ICA; left ICA reveals 60-79% stenosis. Bilateral vertebral artery is antegrade with mild dampening of the left. Discussed with Dr. Donnetta Hutching re pt dizziness; his dizziness is not related to the left antegrade dampened vertebral flow since the right vertebral flow is normal. Pedal pulses are mostly non palpable; ABI's are not obtainable due to calcified vessels; he does not have DM nor renal failure; unknown etiology of calcified lower legs arteries. Right LE waveforms are monophasic suggestive of mild-moderate arterial insufficiency and left LE waveforms are bi and triphasic suggestive of mild arterial insufficiency.  The pain and weakness in his legs seems most likely related to his known lower spine issues.   PLAN:   Based on today's exam and non-invasive vascular lab results, the patient will follow up in 6 months with the following tests carotid Duplex, ABI's. I discussed in depth with the patient the nature of atherosclerosis, and emphasized the importance of maximal medical management including strict control of blood pressure, blood glucose, and lipid levels, obtaining regular exercise, and cessation of smoking.  The patient is aware that without maximal medical management the underlying atherosclerotic disease  process will progress, limiting the benefit of any interventions.  The patient was given information about stroke prevention and what symptoms should prompt the patient to seek immediate medical care.  The patient was given information about PAD including signs, symptoms, treatment, what symptoms should prompt the patient to seek immediate medical care, and risk reduction measures to take. Thank you for allowing [place] to participate in this patient's care.  Clemon Chambers, RN, MSN, FNP-C [facility] Office: [phone]  Clinic MD: Early 08/18/2014 2:52 PM

## 2014-08-18 NOTE — Patient Instructions (Signed)

## 2014-09-22 ENCOUNTER — Encounter: Payer: Self-pay | Admitting: Vascular Surgery

## 2014-09-22 ENCOUNTER — Ambulatory Visit (HOSPITAL_COMMUNITY)
Admission: RE | Admit: 2014-09-22 | Discharge: 2014-09-22 | Disposition: A | Discharge status: Home / Self Care | Payer: Medicare Other | Source: Ambulatory Visit | Attending: Vascular Surgery | Admitting: Vascular Surgery

## 2014-09-22 ENCOUNTER — Other Ambulatory Visit: Payer: Self-pay

## 2014-09-22 ENCOUNTER — Ambulatory Visit (INDEPENDENT_AMBULATORY_CARE_PROVIDER_SITE_OTHER): Payer: Medicare Other | Admitting: Vascular Surgery

## 2014-09-22 VITALS — BP 118/52 | HR 66 | Ht 72.0 in | Wt 169.0 lb

## 2014-09-22 DIAGNOSIS — I6522 Occlusion and stenosis of left carotid artery: Secondary | ICD-10-CM | POA: Diagnosis not present

## 2014-09-22 DIAGNOSIS — G453 Amaurosis fugax: Secondary | ICD-10-CM | POA: Diagnosis not present

## 2014-09-22 NOTE — Progress Notes (Addendum)
[facility] [place]  Established Carotid Patient  History of Present Illness  [name] is a 81 y.o. (29-Jul-1924) male who presents with chief complaint: left eye temporary blindness  Previous carotid studies demonstrated: RICA 40% stenosis with carotid endarterectomy 15 years ago, LICA 49-70% stenosis with symptoms.  Patient has has history of TIA or stroke symptom.  The patient has has had amaurosis fugax or monocular blindness on 09/17/2014.  The patient has not had facial drooping or hemiplegia.  The patient has not had receptive or expressive aphasia.    The patient's previous neurologic deficits have  Resolved.  Past medical history includes: PAD, CAD with MI 1979, He was a smoker, but quit in 1979, hypertension, TIA, Bilateral LE neuropathy-Foot Drop-  He wears AFO's bilaterally.  He takes 81 mg Asprin , Zocor, carvedilol daily.   Past Medical History  Diagnosis Date  . Carotid artery stenosis     without infarction  . Hypertension   . Chronic diastolic heart failure   . Edema   . Bronchitis, acute   . Mini stroke     20 yrs ago during cartodid surgery  . CHF (congestive heart failure)   . Myocardial infarction 1979    Dr Verl Blalock - Cardiologist  . Coronary artery disease     native vessel  . Shortness of breath     with exersition  . COPD (chronic obstructive pulmonary disease)     "mild"  . Sleep apnea     CPAP doesn't use know.  last tested > 5 years abd < 10..  . Stroke     "mild stroke Years ago"  . Cancer     prostate  . Arthritis   . Foot drop, right   . Neuromuscular disorder     neuropathy- LE  . Neuropathy   . Anemia    Past Surgical History  Procedure Laterality Date  . Penile prosthesis implant    . Cholecystectomy    . Right carotid endarterectomy    . Back surgery    . Lumbar laminectomy  03/24/10    L3-4 and L4-5  . Ankle surgery      bone infection  . Cardiac catheterization    . Appendectomy    . Joint replacement       Left  . Radioactive seed implant    . Pars plana vitrectomy  03/19/2012    Procedure: PARS PLANA VITRECTOMY WITH 25 GAUGE;  Surgeon: Hayden Pedro, MD;  Location: [facility];  Service: Ophthalmology;  Laterality: Right;  . Carotid endarterectomy Right     cea- 15 yrs ago  . Spine surgery     History   Social History  . Marital Status: Married    Spouse Name: N/A    Number of Children: N/A  . Years of Education: N/A   Occupational History  . Not on file.   Social History Main Topics  . Smoking status: Former Smoker -- 1.00 packs/day for 20 years    Types: Cigarettes    Quit date: 08/14/1977  . Smokeless tobacco: Never Used  . Alcohol Use: No     Comment: occasional  . Drug Use: No  . Sexual Activity: Not on file   Other Topics Concern  . Not on file   Social History Narrative   Family History  Problem Relation Age of Onset  . Thalassemia      Family history  . Cancer Mother 14    bone  cancer  . Diabetes Mother   . Heart disease Mother   . Heart attack Mother   . Hypertension Mother   . Stroke Father 68  . Heart disease Father   . Hypertension Father   . Hypertension Sister   . Diabetes Sister   . Heart disease Brother   . Heart attack Brother 59  . Cancer Brother    Current Outpatient Prescriptions on File Prior to Visit  Medication Sig Dispense Refill  . acetaminophen (TYLENOL) 325 MG tablet Take 650 mg by mouth as needed.    Marland Kitchen albuterol (VENTOLIN HFA) 108 (90 BASE) MCG/ACT inhaler Inhale 2 puffs into the lungs every 4 (four) hours as needed for wheezing or shortness of breath. 1 Inhaler 11  . aspirin 81 MG tablet Take 81 mg by mouth daily.    . budesonide-formoterol (SYMBICORT) 160-4.5 MCG/ACT inhaler Take 2 puffs first thing in am and then another 2 puffs about 12 hours later. 1 Inhaler 11  . carvedilol (COREG) 12.5 MG tablet 1/2 tab by mouth twice daily    . dextromethorphan-guaiFENesin (MUCINEX DM) 30-600 MG per 12 hr tablet Take 1 tablet by mouth 2 (two)  times daily as needed (cough, congestion).    . famotidine (PEPCID) 20 MG tablet Take 20 mg by mouth at bedtime.    . furosemide (LASIX) 20 MG tablet TAKE 1 TABLET BY MOUTH DAILY. 90 tablet 3  . gemfibrozil (LOPID) 600 MG tablet Take 600 mg by mouth 2 (two) times daily before a meal.    . meclizine (ANTIVERT) 25 MG tablet Take as directed when needed for dizziness    . mirabegron ER (MYRBETRIQ) 50 MG TB24 Take 50 mg by mouth at bedtime.     . nitroGLYCERIN (NITROSTAT) 0.4 MG SL tablet Place 1 tablet (0.4 mg total) under the tongue every 5 (five) minutes as needed. For chest pain 25 tablet 5  . omeprazole (PRILOSEC) 20 MG capsule Take 20 mg by mouth 2 (two) times daily before a meal.    . polyethylene glycol powder (MIRALAX) powder Take 17 g by mouth daily as needed. For constipation    . pregabalin (LYRICA) 75 MG capsule Take 75 mg by mouth daily.    . simvastatin (ZOCOR) 10 MG tablet Take 1 tablet (10 mg total) by mouth daily. 30 tablet 6  . Vitamin D, Ergocalciferol, (DRISDOL) 50000 UNITS CAPS capsule Take 50,000 Units by mouth every Friday.     No current facility-administered medications on file prior to visit.   Allergies  Allergen Reactions  . Amoxicillin     REACTION: unspecified  . Clopidogrel Bisulfate Hives    REACTION: red blue, black blotches  . Dilaudid [Hydromorphone Hcl]   . Penicillins   . Sulfa Antibiotics   . Sulfamethoxazole     REACTION: unspecified  . Sulfonamide Derivatives      ROS: [x]  Positive   [ ]  Negative   [ ]  All sytems reviewed and are negative  General: [ ]  Weight loss, [ ]  Fever, [ ]  chills Neurologic: [ ]  Dizziness, [ ]  Blackouts, [ ]  Seizure [ ]  Stroke, [ ]  "Mini stroke", [ ]  Slurred speech, [ ]  Temporary blindness; [ ]  weakness in arms or legs, [ ]  Hoarseness Cardiac: [ ]  Chest pain/pressure, [ ]  Shortness of breath at rest [ ]  Shortness of breath with exertion, [ ]  Atrial fibrillation or irregular heartbeat Vascular: [ ]  Pain in legs with  walking, [ ]  Pain in legs at rest, [ ]   Pain in legs at night,  [ ]  Non-healing ulcer, [ ]  Blood clot in vein/DVT,   Pulmonary: [ ]  Home oxygen, [ ]  Productive cough, [ ]  Coughing up blood, [ ]  Asthma,  [ ]  Wheezing Musculoskeletal:  [ ]  Arthritis, [ ]  Low back pain, [ ]  Joint pain Hematologic: [ ]  Easy Bruising, [ ]  Anemia; [ ]  Hepatitis Gastrointestinal: [ ]  Blood in stool, [ ]  Gastroesophageal Reflux/heartburn, [ ]  Trouble swallowing Urinary: [ ]  chronic Kidney disease, [ ]  on HD - [ ]  MWF or [ ]  TTHS, [ ]  Burning with urination, [ ]  Difficulty urinating Skin: [ ]  Rashes, [ ]  Wounds Psychological: [ ]  Anxiety, [ ]  Depression  Physical Examination  Filed Vitals:   09/22/14 1438 09/22/14 1440  BP: 112/48 118/52  Pulse: 66   Height: 6' (1.829 m)   Weight: 169 lb (76.658 kg)   SpO2: 98%    Body mass index is 22.92 kg/(m^2).  General: A&O x 3, WD  Eyes: PERRLA, EOMI,   Neck: Supple,   Pulmonary: Sym exp, good air movt, CTAB, no rales, rhonchi, & wheezing  Cardiac: RRR, Nl S1, S2, no Murmurs, rubs or gallops Vascular: Vessel Right Left  Radial Palpable Palpable  Brachial Palpable Palpable  Carotid Palpable, without bruit Palpable, without bruit  Aorta Not palpable N/A  Femoral Palpable Palpable  Popliteal Not palpable Not palpable  PT Palpable Palpable  DP Palpable Palpable   Gastrointestinal: soft, NTND  Musculoskeletal: M/S 5/5 throughout except bilateral foot drop, Extremities without ischemic changes   Neurologic: CN 2-12 intact *, Pain and light touch intact in extremities to the ankles then decreased sensation.*, Motor exam as listed above  Non-Invasive Vascular Imaging  CAROTID DUPLEX (Date: 09/22/2014):  Right carotid < 40% stenosis s/p CEA 15 years ago Left prox ICA velocity 515 stenosis 60-79%    Medical Decision Making   [name] is a 81 y.o. male who presents with: Temporary left eye blindness that lasted 10-15 min. With 60-79% left carotid  stenosis.    Based on the patient's vascular studies and examination,Dr. Early suggested left CEA.    He is symptomatic and the peak systolic velocity of the ICA proximal did increase since his last visit on 08/18/2014.   Left carotid endarterectomy surgery Mon.Feb  4th 2016.  He has agreed to undergo surgery  He was seen by in conjunction with Dr. Donnetta Hutching in clinic today.  He discussed the advantages and disadvantages to surgery.    Theda Sers Trenity [facility] PA-C [facility] [place] Office: [phone]    09/22/2014, 4:12 PM    I have examined the patient, reviewed and agree with above. Clear episode of amaurosis fugax left eye. Underwent repeat duplex today to rule out any thrombosis of his internal carotid artery. This does show a high-grade stenosis with elevated velocities since the study one month ago. Recommend left carotid endarterectomy for reduction of stroke risk. Explained the procedure. He has had prior surgery  With carotid endarterectomy in the right uneventful he is right carotid. We'll schedule surgery for Monday 09/28/2014  EARLY, TODD, MD 09/22/2014 4:52 PM

## 2014-09-23 ENCOUNTER — Encounter (HOSPITAL_COMMUNITY): Payer: Self-pay | Admitting: Pharmacy Technician

## 2014-09-24 ENCOUNTER — Encounter (HOSPITAL_COMMUNITY): Payer: Self-pay

## 2014-09-24 ENCOUNTER — Encounter (HOSPITAL_COMMUNITY)
Admission: RE | Admit: 2014-09-24 | Discharge: 2014-09-24 | Disposition: A | Discharge status: Home / Self Care | Payer: Medicare Other | Source: Ambulatory Visit | Attending: Vascular Surgery | Admitting: Vascular Surgery

## 2014-09-24 ENCOUNTER — Encounter (HOSPITAL_COMMUNITY)
Admission: RE | Admit: 2014-09-24 | Discharge: 2014-09-24 | Disposition: A | Discharge status: Home / Self Care | Payer: Medicare Other | Source: Ambulatory Visit | Attending: Anesthesiology | Admitting: Anesthesiology

## 2014-09-24 DIAGNOSIS — Z01811 Encounter for preprocedural respiratory examination: Secondary | ICD-10-CM

## 2014-09-24 DIAGNOSIS — J069 Acute upper respiratory infection, unspecified: Secondary | ICD-10-CM

## 2014-09-24 HISTORY — DX: Thalassemia, unspecified: D56.9

## 2014-09-24 HISTORY — DX: Gastro-esophageal reflux disease without esophagitis: K21.9

## 2014-09-24 HISTORY — DX: Unspecified hearing loss, left ear: H91.92

## 2014-09-24 HISTORY — DX: Presence of dental prosthetic device (complete) (partial): Z97.2

## 2014-09-24 HISTORY — DX: Stress incontinence (female) (male): N39.3

## 2014-09-24 LAB — URINALYSIS, ROUTINE W REFLEX MICROSCOPIC
BILIRUBIN URINE: NEGATIVE
GLUCOSE, UA: NEGATIVE mg/dL
Hgb urine dipstick: NEGATIVE
KETONES UR: NEGATIVE mg/dL
Leukocytes, UA: NEGATIVE
NITRITE: NEGATIVE
Protein, ur: NEGATIVE mg/dL
Specific Gravity, Urine: 1.013 (ref 1.005–1.030)
Urobilinogen, UA: 1 mg/dL (ref 0.0–1.0)
pH: 5.5 (ref 5.0–8.0)

## 2014-09-24 LAB — COMPREHENSIVE METABOLIC PANEL
ALT: 12 U/L (ref 0–53)
AST: 19 U/L (ref 0–37)
Albumin: 3.4 g/dL — ABNORMAL LOW (ref 3.5–5.2)
Alkaline Phosphatase: 87 U/L (ref 39–117)
Anion gap: 11 (ref 5–15)
BILIRUBIN TOTAL: 0.9 mg/dL (ref 0.3–1.2)
BUN: 27 mg/dL — ABNORMAL HIGH (ref 6–23)
CALCIUM: 9.1 mg/dL (ref 8.4–10.5)
CHLORIDE: 99 mmol/L (ref 96–112)
CO2: 29 mmol/L (ref 19–32)
Creatinine, Ser: 1.59 mg/dL — ABNORMAL HIGH (ref 0.50–1.35)
GFR calc Af Amer: 43 mL/min — ABNORMAL LOW (ref >90)
GFR, EST NON AFRICAN AMERICAN: 37 mL/min — AB (ref >90)
GLUCOSE: 132 mg/dL — AB (ref 70–99)
Potassium: 4.3 mmol/L (ref 3.5–5.1)
Sodium: 139 mmol/L (ref 135–145)
Total Protein: 6.8 g/dL (ref 6.0–8.3)

## 2014-09-24 LAB — TYPE AND SCREEN
ABO/RH(D): A POS
ANTIBODY SCREEN: NEGATIVE

## 2014-09-24 LAB — PROTIME-INR
INR: 1.09 (ref 0.00–1.49)
PROTHROMBIN TIME: 14.2 s (ref 11.6–15.2)

## 2014-09-24 LAB — SURGICAL PCR SCREEN
MRSA, PCR: NEGATIVE
Staphylococcus aureus: POSITIVE — AB

## 2014-09-24 LAB — APTT: aPTT: 34 seconds (ref 24–37)

## 2014-09-24 NOTE — Pre-Procedure Instructions (Signed)
[name]  09/24/2014   Your procedure is scheduled on:  Monday, Feb. 15  Report to [facility] Admitting at 0530 AM.  Call this number if you have problems the morning of surgery: [phone]              For other questions, call [phone] between the hours of 8 am and 4 pm.   Remember:   Do not eat food or drink liquids after midnight.Sunday night   Take these medicines the morning of surgery with A SIP OF WATER: carvedilol,omeprazole,Lyrica,   Do not wear jewelry.  Do not wear lotions, powders, or perfumes.Do not wear deodorant.  Do not shave 48 hours prior to surgery. Men may shave face and neck.  Do not bring valuables to the hospital.  [facility] is not responsible for any belongings or valuables.               Contacts, dentures or bridgework may not be worn into surgery.  Leave suitcase in the car. After surgery it may be brought to your room.  For patients admitted to the hospital, discharge time is determined by your  treatment team.             Special Instructions: [facility] - Preparing for Surgery  Before surgery, you can play an important role.  Because skin is not sterile, your skin needs to be as free of germs as possible.  You can reduce the number of germs on you skin by washing with CHG (chlorahexidine gluconate) soap before surgery.  CHG is an antiseptic cleaner which kills germs and bonds with the skin to continue killing germs even after washing.  Please DO NOT use if you have an allergy to CHG or antibacterial soaps.  If your skin becomes reddened/irritated stop using the CHG and inform your nurse when you arrive at Short Stay.  Do not shave (including legs and underarms) for at least 48 hours prior to the first CHG shower.  You may shave your face.  Please follow these instructions carefully:   1.  Shower with CHG Soap the night before surgery and the  morning of Surgery.  2.  If you choose to wash your hair, wash your hair first as usual  with your   normal shampoo.  3.  After you shampoo, rinse your hair and body thoroughly to remove the  Shampoo.  4.  Use CHG as you would any other liquid soap.  You can apply chg directly  to the skin and wash gently with scrungie or a clean washcloth.  5.  Apply the CHG Soap to your body ONLY FROM THE NECK DOWN.  Do not use on open wounds or open sores.  Avoid contact with your eyes,  ears, mouth and genitals (private parts).  Wash genitals (private parts)  with your normal soap.  6.  Wash thoroughly, paying special attention to the area where your surgery   will be performed.  7.  Thoroughly rinse your body with warm water from the neck down.  8.  DO NOT shower/wash with your normal soap after using and rinsing off    the CHG Soap.  9.  Pat yourself dry with a clean towel.            10.  Wear clean pajamas.            11 .  Place clean sheets on your bed the night of your first shower and do  not   sleep with pets.  Day of Surgery  Do not apply any lotions/deoderants the morning of surgery.  Please wear clean clothes to the hospital/surgery center.     Please read over the following fact sheets that you were given: Pain Booklet, Coughing and Deep Breathing, Blood Transfusion Information and Surgical Site Infection Prevention

## 2014-09-24 NOTE — Progress Notes (Signed)
   09/24/14 1418  OBSTRUCTIVE SLEEP APNEA  Have you ever been diagnosed with sleep apnea through a sleep study? Yes  If yes, do you have and use a CPAP or BPAP machine every night? 1  Do you know the presssure settings on your maching? No

## 2014-09-25 ENCOUNTER — Encounter (HOSPITAL_COMMUNITY): Payer: Self-pay

## 2014-09-25 NOTE — Progress Notes (Signed)
Anesthesia Chart Review:  Patient is a 81 year old male scheduled for left CEA on 09/28/14 by Dr. Donnetta Hutching. He had amaurosis fugax or monocular blindness involving the left eye on 09/17/14.   History includes CAD/anterior MI '79, CHF, carotid artery stenosis s/p right CEA > 15 years ago, TIA, thalassemia, OSA with CPAP, COPD Gold stage III, HTN, edema, former smoker, GERD, vertigo, left ear deafness, lumbar decompression '11. PCP is Dr. Melinda Crutch. Pulmonologist is Dr. Melvyn Novas. Cardiologist is Dr. Meda Coffee, last visit 11/2013. He was asymptomatic from a CV standpoint and euvolemic.  RLE venous duplex ordered (negative for DVT), but otherwise no new cardiac testing and one year follow-up recommended.   Meds include albuterol, ASA, Symbicort, Coreg, Lasix, Lopid, Antivert, Nitro, Prilosec, Lyrica, Zocor, mirabegron EF.   04/30/11 Echo (under Notes tab): LV cavity size was normal. Mild LVH. Systolic function was mildly to moderately reduced, LVEF 40-45%. Akinesis of the apical myocardium and mid-distal anteroseptal myocardium. Grade 1 diastolic dysfunction. Calcified mitral annulus. Trivial MR. Trivial TR. Pulmonary arteries systolic pressure mildly increased.  (Previous 12/18/08 echo showed EF 25-30%, inferior, inferoseptal, apex, distal lateral, mid-distal anterior akinesis.)   12/18/08 cardiac cath (done during admission for NSTEMI with acute diastolic CHF): 1. The left main coronary artery has mild 20% plaque. 2. The left anterior descending is known to have a chronic total  occlusion in the midportion of the vessel just beyond the first  moderate-to-large size diagonal branch. This appears unchanged  from the prior cath in July 2009. The first diagonal is a moderate-  sized vessel that has a 30% plaque noted diffusely throughout the  vessel. The distal left anterior descending coronary artery fills  faintly from left-to-left collaterals. 3. The circumflex artery primarily consists of a  large marginal  branch. There is diffuse 20-30% disease throughout this vessel.  There are no severely obstructive lesions noted. 4. The right coronary artery is a large dominant vessel that has a  proximal 60-70% stenosis that appears unchanged from prior cath.  The midportion of the vessel has serial 40-50% stenoses. The  distal portion of the vessel has a calcific 60-70% stenosis that  appears unchanged from prior cath. The posterior descending artery  posterolateral branch has mild plaque disease only. IMPRESSION: 1. Double-vessel coronary artery disease that appears stable and  unchanged from cath in July 2009. 2. Acute pulmonary edema that is likely secondary to diastolic  dysfunction. The patient's blood pressure was elevated to 694  systolically when he arrived in Cath Lab. Ultimately medical therapy was recommended.  11/17/13: NSR, anteroseptal infarct (age undetemined).  08/18/14 carotid duplex: Patent right CEA site with < 40% hyperplasia in ICA. Right ECA stenosis. 85-46% LICA stenosis (confirmed on 09/22/14), bilateral vertebral artery is antegrade with mild dampening of the left.    09/24/14 CXR: No evidence of acute cardiopulmonary disease.  11/13/13 PFTs: FVC 2.82 (63%), FEV1 1.46 (45%), ratio 52. Severe airway obstruction with low vital capacity.  11/13/2013 Walked RA x 3 laps @ 185 ft each stopped due to End of study no desats  Preoperative labs noted. Cr 1.59, up from 1.3 on 12/08/13.  Main labs called and could not locate his CBC specimen, so that will have to be redrawn prior to surgery.   Patient wit known CAD, CHF.  He was seen by cardiology within the past year and by notes felt stable from a CV/CHF standpoint. He has symptomatic carotid stenosis and needs left CEA for future CVA prevention.  Discussed with  anesthesiologist Dr. Deatra Canter. Patient will be further evaluated by his assigned anesthesiologist on the day of  surgery.  If no acute CV/CHF symptoms it is anticipated that he can proceed as planned.  George Hugh [facility] Short Stay Center/Anesthesiology Phone [phone] 09/25/2014 1:50 PM

## 2014-09-27 MED ORDER — CIPROFLOXACIN IN D5W 400 MG/200ML IV SOLN
400.0000 mg | INTRAVENOUS | Status: AC
  Administered 2014-09-28: 400 mg via INTRAVENOUS
  Filled 2014-09-27: qty 200

## 2014-09-28 ENCOUNTER — Encounter (HOSPITAL_COMMUNITY): Payer: Self-pay | Admitting: Certified Registered Nurse Anesthetist

## 2014-09-28 ENCOUNTER — Inpatient Hospital Stay (HOSPITAL_COMMUNITY): Payer: Medicare Other | Admitting: Vascular Surgery

## 2014-09-28 ENCOUNTER — Inpatient Hospital Stay (HOSPITAL_COMMUNITY)
Admission: RE | Admit: 2014-09-28 | Discharge: 2014-09-29 | DRG: 038 | Disposition: A | Discharge status: Home / Self Care | Payer: Medicare Other | Source: Ambulatory Visit | Attending: Vascular Surgery | Admitting: Vascular Surgery

## 2014-09-28 ENCOUNTER — Inpatient Hospital Stay (HOSPITAL_COMMUNITY): Payer: Medicare Other | Admitting: Certified Registered Nurse Anesthetist

## 2014-09-28 ENCOUNTER — Encounter (HOSPITAL_COMMUNITY)
Admission: RE | Disposition: A | Discharge status: Home / Self Care | Payer: Self-pay | Source: Ambulatory Visit | Attending: Vascular Surgery

## 2014-09-28 DIAGNOSIS — M21371 Foot drop, right foot: Secondary | ICD-10-CM | POA: Diagnosis present

## 2014-09-28 DIAGNOSIS — K219 Gastro-esophageal reflux disease without esophagitis: Secondary | ICD-10-CM | POA: Diagnosis present

## 2014-09-28 DIAGNOSIS — I252 Old myocardial infarction: Secondary | ICD-10-CM | POA: Diagnosis not present

## 2014-09-28 DIAGNOSIS — Z960 Presence of urogenital implants: Secondary | ICD-10-CM | POA: Diagnosis present

## 2014-09-28 DIAGNOSIS — I6522 Occlusion and stenosis of left carotid artery: Secondary | ICD-10-CM | POA: Diagnosis present

## 2014-09-28 DIAGNOSIS — Z9049 Acquired absence of other specified parts of digestive tract: Secondary | ICD-10-CM | POA: Diagnosis present

## 2014-09-28 DIAGNOSIS — I10 Essential (primary) hypertension: Secondary | ICD-10-CM | POA: Diagnosis present

## 2014-09-28 DIAGNOSIS — Z880 Allergy status to penicillin: Secondary | ICD-10-CM

## 2014-09-28 DIAGNOSIS — R42 Dizziness and giddiness: Secondary | ICD-10-CM | POA: Diagnosis present

## 2014-09-28 DIAGNOSIS — Z87891 Personal history of nicotine dependence: Secondary | ICD-10-CM

## 2014-09-28 DIAGNOSIS — Z882 Allergy status to sulfonamides status: Secondary | ICD-10-CM | POA: Diagnosis not present

## 2014-09-28 DIAGNOSIS — Z888 Allergy status to other drugs, medicaments and biological substances status: Secondary | ICD-10-CM | POA: Diagnosis not present

## 2014-09-28 DIAGNOSIS — Z8249 Family history of ischemic heart disease and other diseases of the circulatory system: Secondary | ICD-10-CM | POA: Diagnosis not present

## 2014-09-28 DIAGNOSIS — Z7982 Long term (current) use of aspirin: Secondary | ICD-10-CM

## 2014-09-28 DIAGNOSIS — Z79899 Other long term (current) drug therapy: Secondary | ICD-10-CM | POA: Diagnosis not present

## 2014-09-28 DIAGNOSIS — I5032 Chronic diastolic (congestive) heart failure: Secondary | ICD-10-CM | POA: Diagnosis present

## 2014-09-28 DIAGNOSIS — G4730 Sleep apnea, unspecified: Secondary | ICD-10-CM | POA: Diagnosis present

## 2014-09-28 DIAGNOSIS — Z9660 Presence of unspecified orthopedic joint implant: Secondary | ICD-10-CM | POA: Diagnosis present

## 2014-09-28 DIAGNOSIS — J449 Chronic obstructive pulmonary disease, unspecified: Secondary | ICD-10-CM | POA: Diagnosis present

## 2014-09-28 DIAGNOSIS — Z823 Family history of stroke: Secondary | ICD-10-CM | POA: Diagnosis not present

## 2014-09-28 DIAGNOSIS — M1990 Unspecified osteoarthritis, unspecified site: Secondary | ICD-10-CM | POA: Diagnosis present

## 2014-09-28 DIAGNOSIS — H9192 Unspecified hearing loss, left ear: Secondary | ICD-10-CM | POA: Diagnosis present

## 2014-09-28 DIAGNOSIS — G629 Polyneuropathy, unspecified: Secondary | ICD-10-CM | POA: Diagnosis present

## 2014-09-28 DIAGNOSIS — Z9889 Other specified postprocedural states: Secondary | ICD-10-CM

## 2014-09-28 DIAGNOSIS — C61 Malignant neoplasm of prostate: Secondary | ICD-10-CM | POA: Diagnosis present

## 2014-09-28 DIAGNOSIS — Z8673 Personal history of transient ischemic attack (TIA), and cerebral infarction without residual deficits: Secondary | ICD-10-CM

## 2014-09-28 DIAGNOSIS — I739 Peripheral vascular disease, unspecified: Secondary | ICD-10-CM | POA: Diagnosis present

## 2014-09-28 DIAGNOSIS — D649 Anemia, unspecified: Secondary | ICD-10-CM | POA: Diagnosis present

## 2014-09-28 DIAGNOSIS — I2510 Atherosclerotic heart disease of native coronary artery without angina pectoris: Secondary | ICD-10-CM | POA: Diagnosis present

## 2014-09-28 DIAGNOSIS — D569 Thalassemia, unspecified: Secondary | ICD-10-CM | POA: Diagnosis present

## 2014-09-28 DIAGNOSIS — I6529 Occlusion and stenosis of unspecified carotid artery: Secondary | ICD-10-CM | POA: Diagnosis present

## 2014-09-28 DIAGNOSIS — Z885 Allergy status to narcotic agent status: Secondary | ICD-10-CM | POA: Diagnosis not present

## 2014-09-28 HISTORY — PX: ENDARTERECTOMY: SHX5162

## 2014-09-28 HISTORY — PX: PATCH ANGIOPLASTY: SHX6230

## 2014-09-28 LAB — CBC
HCT: 39.6 % (ref 39.0–52.0)
HEMATOCRIT: 33.6 % — AB (ref 39.0–52.0)
HEMOGLOBIN: 10.6 g/dL — AB (ref 13.0–17.0)
Hemoglobin: 12.4 g/dL — ABNORMAL LOW (ref 13.0–17.0)
MCH: 19.8 pg — ABNORMAL LOW (ref 26.0–34.0)
MCH: 20 pg — ABNORMAL LOW (ref 26.0–34.0)
MCHC: 31.3 g/dL (ref 30.0–36.0)
MCHC: 31.5 g/dL (ref 30.0–36.0)
MCV: 63.3 fL — AB (ref 78.0–100.0)
MCV: 63.4 fL — AB (ref 78.0–100.0)
PLATELETS: 168 10*3/uL (ref 150–400)
Platelets: 136 10*3/uL — ABNORMAL LOW (ref 150–400)
RBC: 6.26 MIL/uL — AB (ref 4.22–5.81)
RBC: 5.3 MIL/uL (ref 4.22–5.81)
RDW: 15.2 % (ref 11.5–15.5)
RDW: 15.1 % (ref 11.5–15.5)
WBC: 6.6 10*3/uL (ref 4.0–10.5)
WBC: 7 10*3/uL (ref 4.0–10.5)

## 2014-09-28 LAB — CREATININE, SERUM
CREATININE: 1.32 mg/dL (ref 0.50–1.35)
GFR calc non Af Amer: 46 mL/min — ABNORMAL LOW (ref >90)
GFR, EST AFRICAN AMERICAN: 54 mL/min — AB (ref >90)

## 2014-09-28 SURGERY — ENDARTERECTOMY, CAROTID
Anesthesia: General | Site: Neck | Laterality: Left

## 2014-09-28 MED ORDER — ONDANSETRON HCL 4 MG/2ML IJ SOLN
INTRAMUSCULAR | Status: AC
  Filled 2014-09-28: qty 2

## 2014-09-28 MED ORDER — SUCCINYLCHOLINE CHLORIDE 20 MG/ML IJ SOLN
INTRAMUSCULAR | Status: AC
  Filled 2014-09-28: qty 1

## 2014-09-28 MED ORDER — VANCOMYCIN HCL IN DEXTROSE 1-5 GM/200ML-% IV SOLN
1000.0000 mg | INTRAVENOUS | Status: AC
  Administered 2014-09-28: 1000 mg via INTRAVENOUS
  Filled 2014-09-28: qty 200

## 2014-09-28 MED ORDER — ONDANSETRON HCL 4 MG/2ML IJ SOLN: 4.0000 mg | Freq: Four times a day (QID) | INTRAMUSCULAR | Status: DC | PRN

## 2014-09-28 MED ORDER — BUDESONIDE-FORMOTEROL FUMARATE 160-4.5 MCG/ACT IN AERO
2.0000 | INHALATION_SPRAY | Freq: Two times a day (BID) | RESPIRATORY_TRACT | Status: DC
  Administered 2014-09-28 – 2014-09-29 (×2): 2 via RESPIRATORY_TRACT
  Filled 2014-09-28: qty 6

## 2014-09-28 MED ORDER — ALBUTEROL SULFATE (2.5 MG/3ML) 0.083% IN NEBU: 2.5000 mg | INHALATION_SOLUTION | RESPIRATORY_TRACT | Status: DC | PRN

## 2014-09-28 MED ORDER — ACETAMINOPHEN 325 MG PO TABS: 650.0000 mg | ORAL_TABLET | Freq: Four times a day (QID) | ORAL | Status: DC | PRN

## 2014-09-28 MED ORDER — 0.9 % SODIUM CHLORIDE (POUR BTL) OPTIME
TOPICAL | Status: DC | PRN
  Administered 2014-09-28 (×2): 1000 mL

## 2014-09-28 MED ORDER — CARVEDILOL 6.25 MG PO TABS
6.2500 mg | ORAL_TABLET | Freq: Two times a day (BID) | ORAL | Status: DC
  Administered 2014-09-28 – 2014-09-29 (×2): 6.25 mg via ORAL
  Filled 2014-09-28 (×4): qty 1

## 2014-09-28 MED ORDER — ONDANSETRON HCL 4 MG/2ML IJ SOLN
INTRAMUSCULAR | Status: DC | PRN
  Administered 2014-09-28: 4 mg via INTRAVENOUS

## 2014-09-28 MED ORDER — DOCUSATE SODIUM 100 MG PO CAPS
100.0000 mg | ORAL_CAPSULE | Freq: Every day | ORAL | Status: DC
  Administered 2014-09-29: 100 mg via ORAL
  Filled 2014-09-28: qty 1

## 2014-09-28 MED ORDER — ACETAMINOPHEN 160 MG/5ML PO SOLN
325.0000 mg | ORAL | Status: DC | PRN
  Filled 2014-09-28: qty 20.3

## 2014-09-28 MED ORDER — LACTATED RINGERS IV SOLN
INTRAVENOUS | Status: DC | PRN
  Administered 2014-09-28: 11:00:00 via INTRAVENOUS

## 2014-09-28 MED ORDER — HEPARIN SODIUM (PORCINE) 1000 UNIT/ML IJ SOLN
INTRAMUSCULAR | Status: AC
  Filled 2014-09-28: qty 1

## 2014-09-28 MED ORDER — FENTANYL CITRATE 0.05 MG/ML IJ SOLN
INTRAMUSCULAR | Status: AC
  Filled 2014-09-28: qty 5

## 2014-09-28 MED ORDER — GEMFIBROZIL 600 MG PO TABS
600.0000 mg | ORAL_TABLET | Freq: Two times a day (BID) | ORAL | Status: DC
  Administered 2014-09-28 – 2014-09-29 (×2): 600 mg via ORAL
  Filled 2014-09-28 (×4): qty 1

## 2014-09-28 MED ORDER — OXYCODONE HCL 5 MG/5ML PO SOLN: 5.0000 mg | Freq: Once | ORAL | Status: DC | PRN

## 2014-09-28 MED ORDER — LIDOCAINE HCL 4 % MT SOLN
OROMUCOSAL | Status: DC | PRN
  Administered 2014-09-28: 4 mL via TOPICAL

## 2014-09-28 MED ORDER — MAGNESIUM SULFATE 2 GM/50ML IV SOLN: 2.0000 g | Freq: Every day | INTRAVENOUS | Status: DC | PRN

## 2014-09-28 MED ORDER — EPHEDRINE SULFATE 50 MG/ML IJ SOLN
INTRAMUSCULAR | Status: DC | PRN
  Administered 2014-09-28 (×6): 5 mg via INTRAVENOUS

## 2014-09-28 MED ORDER — SUCCINYLCHOLINE CHLORIDE 20 MG/ML IJ SOLN
INTRAMUSCULAR | Status: DC | PRN
  Administered 2014-09-28: 40 mg via INTRAVENOUS

## 2014-09-28 MED ORDER — OXYCODONE HCL 5 MG PO TABS: 5.0000 mg | ORAL_TABLET | Freq: Once | ORAL | Status: DC | PRN

## 2014-09-28 MED ORDER — ASPIRIN EC 325 MG PO TBEC
325.0000 mg | DELAYED_RELEASE_TABLET | Freq: Every day | ORAL | Status: DC
  Administered 2014-09-28 – 2014-09-29 (×2): 325 mg via ORAL
  Filled 2014-09-28 (×2): qty 1

## 2014-09-28 MED ORDER — MIRABEGRON ER 50 MG PO TB24
50.0000 mg | ORAL_TABLET | Freq: Every day | ORAL | Status: DC
  Administered 2014-09-28: 50 mg via ORAL
  Filled 2014-09-28 (×2): qty 1

## 2014-09-28 MED ORDER — POLYETHYLENE GLYCOL 3350 17 GM/SCOOP PO POWD
17.0000 g | Freq: Every day | ORAL | Status: DC | PRN
  Filled 2014-09-28: qty 255

## 2014-09-28 MED ORDER — EPHEDRINE SULFATE 50 MG/ML IJ SOLN
INTRAMUSCULAR | Status: AC
  Filled 2014-09-28: qty 1

## 2014-09-28 MED ORDER — CHLORHEXIDINE GLUCONATE 4 % EX LIQD
60.0000 mL | Freq: Once | CUTANEOUS | Status: DC
  Filled 2014-09-28: qty 60

## 2014-09-28 MED ORDER — FUROSEMIDE 20 MG PO TABS
20.0000 mg | ORAL_TABLET | Freq: Every day | ORAL | Status: DC
  Administered 2014-09-28 – 2014-09-29 (×2): 20 mg via ORAL
  Filled 2014-09-28 (×2): qty 1

## 2014-09-28 MED ORDER — PHENYLEPHRINE HCL 10 MG/ML IJ SOLN
10.0000 mg | INTRAVENOUS | Status: DC | PRN
  Administered 2014-09-28: 10 ug/min via INTRAVENOUS

## 2014-09-28 MED ORDER — POLYETHYLENE GLYCOL 3350 17 G PO PACK
17.0000 g | PACK | Freq: Every day | ORAL | Status: DC | PRN
  Filled 2014-09-28: qty 1

## 2014-09-28 MED ORDER — SODIUM CHLORIDE 0.9 % IV SOLN: 500.0000 mL | Freq: Once | INTRAVENOUS | Status: AC | PRN

## 2014-09-28 MED ORDER — LIDOCAINE HCL (PF) 1 % IJ SOLN
INTRAMUSCULAR | Status: AC
  Filled 2014-09-28: qty 30

## 2014-09-28 MED ORDER — HEPARIN SODIUM (PORCINE) 1000 UNIT/ML IJ SOLN
INTRAMUSCULAR | Status: DC | PRN
  Administered 2014-09-28: 7000 [IU] via INTRAVENOUS

## 2014-09-28 MED ORDER — PREGABALIN 75 MG PO CAPS
75.0000 mg | ORAL_CAPSULE | Freq: Every day | ORAL | Status: DC
  Administered 2014-09-29: 75 mg via ORAL
  Filled 2014-09-28 (×2): qty 1

## 2014-09-28 MED ORDER — ENOXAPARIN SODIUM 30 MG/0.3ML ~~LOC~~ SOLN
30.0000 mg | SUBCUTANEOUS | Status: DC
  Filled 2014-09-28: qty 0.3

## 2014-09-28 MED ORDER — POTASSIUM CHLORIDE CRYS ER 20 MEQ PO TBCR: 20.0000 meq | EXTENDED_RELEASE_TABLET | Freq: Every day | ORAL | Status: DC | PRN

## 2014-09-28 MED ORDER — SODIUM CHLORIDE 0.9 % IV SOLN
0.0125 ug/kg/min | INTRAVENOUS | Status: DC
  Filled 2014-09-28: qty 2000

## 2014-09-28 MED ORDER — VITAMIN D (ERGOCALCIFEROL) 1.25 MG (50000 UNIT) PO CAPS: 50000.0000 [IU] | ORAL_CAPSULE | ORAL | Status: DC

## 2014-09-28 MED ORDER — LIDOCAINE HCL (CARDIAC) 20 MG/ML IV SOLN
INTRAVENOUS | Status: AC
  Filled 2014-09-28: qty 10

## 2014-09-28 MED ORDER — STERILE WATER FOR INJECTION IJ SOLN
INTRAMUSCULAR | Status: AC
  Filled 2014-09-28: qty 10

## 2014-09-28 MED ORDER — METOPROLOL TARTRATE 1 MG/ML IV SOLN: 2.0000 mg | INTRAVENOUS | Status: DC | PRN

## 2014-09-28 MED ORDER — PROTAMINE SULFATE 10 MG/ML IV SOLN
INTRAVENOUS | Status: DC | PRN
  Administered 2014-09-28 (×2): 20 mg via INTRAVENOUS
  Administered 2014-09-28: 10 mg via INTRAVENOUS

## 2014-09-28 MED ORDER — ROCURONIUM BROMIDE 50 MG/5ML IV SOLN
INTRAVENOUS | Status: AC
  Filled 2014-09-28: qty 1

## 2014-09-28 MED ORDER — HEPARIN SODIUM (PORCINE) 5000 UNIT/ML IJ SOLN
INTRAMUSCULAR | Status: DC | PRN
  Administered 2014-09-28: 500 mL

## 2014-09-28 MED ORDER — LACTATED RINGERS IV SOLN
INTRAVENOUS | Status: DC
  Administered 2014-09-28: 11:00:00 via INTRAVENOUS

## 2014-09-28 MED ORDER — LABETALOL HCL 5 MG/ML IV SOLN: 10.0000 mg | INTRAVENOUS | Status: DC | PRN

## 2014-09-28 MED ORDER — PROTAMINE SULFATE 10 MG/ML IV SOLN
INTRAVENOUS | Status: AC
  Filled 2014-09-28: qty 5

## 2014-09-28 MED ORDER — GUAIFENESIN-DM 100-10 MG/5ML PO SYRP: 15.0000 mL | ORAL_SOLUTION | ORAL | Status: DC | PRN

## 2014-09-28 MED ORDER — OXYCODONE-ACETAMINOPHEN 5-325 MG PO TABS
1.0000 | ORAL_TABLET | ORAL | Status: DC | PRN
  Administered 2014-09-28: 2 via ORAL
  Filled 2014-09-28: qty 2

## 2014-09-28 MED ORDER — SODIUM CHLORIDE 0.9 % IV SOLN
INTRAVENOUS | Status: DC
  Administered 2014-09-28: 16:00:00 via INTRAVENOUS

## 2014-09-28 MED ORDER — MORPHINE SULFATE 2 MG/ML IJ SOLN: 2.0000 mg | INTRAMUSCULAR | Status: DC | PRN

## 2014-09-28 MED ORDER — SODIUM CHLORIDE 0.9 % IV SOLN: INTRAVENOUS | Status: DC

## 2014-09-28 MED ORDER — DOPAMINE-DEXTROSE 3.2-5 MG/ML-% IV SOLN: 3.0000 ug/kg/min | INTRAVENOUS | Status: DC

## 2014-09-28 MED ORDER — ACETAMINOPHEN 325 MG PO TABS: 325.0000 mg | ORAL_TABLET | ORAL | Status: DC | PRN

## 2014-09-28 MED ORDER — PROPOFOL 10 MG/ML IV BOLUS
INTRAVENOUS | Status: AC
  Filled 2014-09-28: qty 20

## 2014-09-28 MED ORDER — SODIUM CHLORIDE 0.9 % IV SOLN
2000.0000 ug | INTRAVENOUS | Status: DC | PRN
  Administered 2014-09-28: .2 ug/kg/min via INTRAVENOUS

## 2014-09-28 MED ORDER — FENTANYL CITRATE 0.05 MG/ML IJ SOLN
INTRAMUSCULAR | Status: AC
Start: 2014-09-28 — End: 2014-09-28
  Filled 2014-09-28: qty 5

## 2014-09-28 MED ORDER — HYDRALAZINE HCL 20 MG/ML IJ SOLN: 5.0000 mg | INTRAMUSCULAR | Status: DC | PRN

## 2014-09-28 MED ORDER — NITROGLYCERIN 0.4 MG SL SUBL: 0.4000 mg | SUBLINGUAL_TABLET | SUBLINGUAL | Status: DC | PRN

## 2014-09-28 MED ORDER — FENTANYL CITRATE 0.05 MG/ML IJ SOLN: 25.0000 ug | INTRAMUSCULAR | Status: DC | PRN

## 2014-09-28 MED ORDER — ALUM & MAG HYDROXIDE-SIMETH 200-200-20 MG/5ML PO SUSP: 15.0000 mL | ORAL | Status: DC | PRN

## 2014-09-28 MED ORDER — PROPOFOL 10 MG/ML IV BOLUS
INTRAVENOUS | Status: DC | PRN
  Administered 2014-09-28: 30 mg via INTRAVENOUS

## 2014-09-28 MED ORDER — SIMVASTATIN 10 MG PO TABS
10.0000 mg | ORAL_TABLET | Freq: Every day | ORAL | Status: DC
  Administered 2014-09-28 – 2014-09-29 (×2): 10 mg via ORAL
  Filled 2014-09-28 (×2): qty 1

## 2014-09-28 MED ORDER — LIDOCAINE HCL (CARDIAC) 20 MG/ML IV SOLN
INTRAVENOUS | Status: DC | PRN
  Administered 2014-09-28: 60 mg via INTRAVENOUS

## 2014-09-28 MED ORDER — PANTOPRAZOLE SODIUM 40 MG PO TBEC
40.0000 mg | DELAYED_RELEASE_TABLET | Freq: Every day | ORAL | Status: DC
  Administered 2014-09-28 – 2014-09-29 (×2): 40 mg via ORAL
  Filled 2014-09-28 (×2): qty 1

## 2014-09-28 MED ORDER — PHENOL 1.4 % MT LIQD: 1.0000 | OROMUCOSAL | Status: DC | PRN

## 2014-09-28 SURGICAL SUPPLY — 48 items
BENZOIN TINCTURE PRP APPL 2/3 (GAUZE/BANDAGES/DRESSINGS) ×3 IMPLANT
BLADE SURG 10 STRL SS (BLADE) ×3 IMPLANT
CABLE VENTRICULAR (VASCULAR PRODUCTS) ×3 IMPLANT
CANISTER SUCTION 2500CC (MISCELLANEOUS) ×3 IMPLANT
CANNULA VESSEL 3MM 2 BLNT TIP (CANNULA) IMPLANT
CATH ROBINSON RED A/P 18FR (CATHETERS) ×3 IMPLANT
CLIP LIGATING EXTRA MED SLVR (CLIP) ×3 IMPLANT
CLIP LIGATING EXTRA SM BLUE (MISCELLANEOUS) ×3 IMPLANT
CLOSURE WOUND 1/2 X4 (GAUZE/BANDAGES/DRESSINGS) ×1
CRADLE DONUT ADULT HEAD (MISCELLANEOUS) ×3 IMPLANT
DECANTER SPIKE VIAL GLASS SM (MISCELLANEOUS) IMPLANT
DRAIN HEMOVAC 1/8 X 5 (WOUND CARE) IMPLANT
DRSG COVADERM 4X8 (GAUZE/BANDAGES/DRESSINGS) ×3 IMPLANT
ELECT REM PT RETURN 9FT ADLT (ELECTROSURGICAL) ×3
ELECTRODE REM PT RTRN 9FT ADLT (ELECTROSURGICAL) ×1 IMPLANT
EVACUATOR SILICONE 100CC (DRAIN) IMPLANT
GAUZE SPONGE 4X4 12PLY STRL (GAUZE/BANDAGES/DRESSINGS) ×3 IMPLANT
GEL ULTRASOUND 20GR AQUASONIC (MISCELLANEOUS) IMPLANT
GLOVE BIOGEL PI IND STRL 7.5 (GLOVE) ×1 IMPLANT
GLOVE BIOGEL PI INDICATOR 7.5 (GLOVE) ×2
GLOVE ECLIPSE 7.0 STRL STRAW (GLOVE) ×3 IMPLANT
GLOVE SS BIOGEL STRL SZ 7.5 (GLOVE) ×1 IMPLANT
GLOVE SUPERSENSE BIOGEL SZ 7.5 (GLOVE) ×2
GOWN STRL REUS W/ TWL LRG LVL3 (GOWN DISPOSABLE) ×3 IMPLANT
GOWN STRL REUS W/TWL LRG LVL3 (GOWN DISPOSABLE) ×6
KIT BASIN OR (CUSTOM PROCEDURE TRAY) ×3 IMPLANT
KIT ROOM TURNOVER OR (KITS) ×3 IMPLANT
NEEDLE 22X1 1/2 (OR ONLY) (NEEDLE) IMPLANT
NS IRRIG 1000ML POUR BTL (IV SOLUTION) ×6 IMPLANT
PACK CAROTID (CUSTOM PROCEDURE TRAY) ×3 IMPLANT
PAD ARMBOARD 7.5X6 YLW CONV (MISCELLANEOUS) ×6 IMPLANT
PATCH HEMASHIELD 8X75 (Vascular Products) ×3 IMPLANT
PENCIL BUTTON HOLSTER BLD 10FT (ELECTRODE) ×3 IMPLANT
PROBE PENCIL 8 MHZ STRL DISP (MISCELLANEOUS) IMPLANT
SHUNT CAROTID BYPASS 10 (VASCULAR PRODUCTS) ×3 IMPLANT
SHUNT CAROTID BYPASS 12FRX15.5 (VASCULAR PRODUCTS) IMPLANT
SPONGE GAUZE 4X4 12PLY STER LF (GAUZE/BANDAGES/DRESSINGS) ×3 IMPLANT
SPONGE INTESTINAL PEANUT (DISPOSABLE) ×3 IMPLANT
STRIP CLOSURE SKIN 1/2X4 (GAUZE/BANDAGES/DRESSINGS) ×2 IMPLANT
SUT ETHILON 3 0 PS 1 (SUTURE) IMPLANT
SUT PROLENE 6 0 CC (SUTURE) ×15 IMPLANT
SUT SILK 3 0 (SUTURE)
SUT SILK 3-0 18XBRD TIE 12 (SUTURE) IMPLANT
SUT VIC AB 3-0 SH 27 (SUTURE) ×4
SUT VIC AB 3-0 SH 27X BRD (SUTURE) ×2 IMPLANT
SUT VICRYL 4-0 PS2 18IN ABS (SUTURE) ×3 IMPLANT
SYR CONTROL 10ML LL (SYRINGE) IMPLANT
WATER STERILE IRR 1000ML POUR (IV SOLUTION) ×3 IMPLANT

## 2014-09-28 NOTE — Progress Notes (Signed)
Pt states he had a brown leather bag with shoulder strap pre operatively. I called to PACU and it was not seen. Designer, multimedia notified, will attempt to locate again tomorrow.

## 2014-09-28 NOTE — Progress Notes (Addendum)
       Patient alert and oriented x 3.   No tongue deviation, smile is symmetric Grip 5/5 bil upper ext  Stable disposition S/P left CEA  COLLINS, EMMA MAUREEN PA-C  I have examined the patient, reviewed and agree with above.  Jamone Garrido, MD 09/29/2014 7:32 AM

## 2014-09-28 NOTE — Transfer of Care (Signed)
Immediate Anesthesia Transfer of Care Note  Patient: [name]  Procedure(s) Performed: Procedure(s): Left Carotid Endarterectomy (Left) WITH DACRON PATCH ANGIOPLASTY (Left)  Patient Location: PACU  Anesthesia Type:General  Level of Consciousness: awake, alert  and oriented  Airway & Oxygen Therapy: Patient Spontanous Breathing and Patient connected to nasal cannula oxygen  Post-op Assessment: Report given to RN, Post -op Vital signs reviewed and stable and Patient moving all extremities X 4  Post vital signs: Reviewed and stable  Last Vitals:  Filed Vitals:   09/28/14 1029  BP: 154/56  Pulse: 67  Temp: 36.4 C  Resp: 20    Complications: No apparent anesthesia complications

## 2014-09-28 NOTE — Anesthesia Preprocedure Evaluation (Addendum)
Anesthesia Evaluation  Patient identified by MRN, date of birth, ID band Patient awake    Reviewed: Allergy & Precautions, NPO status , Patient's Chart, lab work & pertinent test results, reviewed documented beta blocker date and time   History of Anesthesia Complications Negative for: history of anesthetic complications  Airway Mallampati: II  TM Distance: >3 FB Neck ROM: Full    Dental  (+) Dental Advisory Given, Edentulous Upper   Pulmonary shortness of breath and with exertion, sleep apnea and Continuous Positive Airway Pressure Ventilation , COPD COPD inhaler, former smoker,  breath sounds clear to auscultation        Cardiovascular hypertension, Pt. on medications and Pt. on home beta blockers - angina+ CAD, + Past MI, + Peripheral Vascular Disease and +CHF Rhythm:Regular     Neuro/Psych CVA, No Residual Symptoms negative psych ROS   GI/Hepatic Neg liver ROS, GERD-  Medicated and Controlled,  Endo/Other  negative endocrine ROS  Renal/GU Renal InsufficiencyRenal disease     Musculoskeletal  (+) Arthritis -,   Abdominal   Peds  Hematology  (+) anemia ,   Anesthesia Other Findings   Reproductive/Obstetrics                          Anesthesia Physical Anesthesia Plan  ASA: III  Anesthesia Plan: General   Post-op Pain Management:    Induction: Intravenous  Airway Management Planned: Oral ETT  Additional Equipment: Arterial line  Intra-op Plan:   Post-operative Plan: Extubation in OR and Possible Post-op intubation/ventilation  Informed Consent: I have reviewed the patients History and Physical, chart, labs and discussed the procedure including the risks, benefits and alternatives for the proposed anesthesia with the patient or authorized representative who has indicated his/her understanding and acceptance.   Dental advisory given  Plan Discussed with: CRNA, Anesthesiologist and  Surgeon  Anesthesia Plan Comments:        Anesthesia Quick Evaluation

## 2014-09-28 NOTE — Op Note (Signed)
     Patient name: [name] MRN: [id number] DOB: October 19, 1923 Sex: male  09/28/2014 Pre-operative Diagnosis: Symptomatic left carotid stenosis Post-operative diagnosis:  Same Surgeon:  Rosetta Posner, M.D. Assistants:  Pablo Ledger Procedure:    left carotid Endarterectomy with Dacron patch angioplasty Anesthesia:  General Blood Loss:  See anesthesia record   Indications for surgery:  Symptomatic left carotid stenosis was amaurosis fugax  Procedure in detail:  The patient was taken to the operating and placed in the supine position. The neck was prepped and draped in the usual sterile fashion. An incision was made anterior to the sternocleidomastoid muscle and continued with electrocautery through the platysma muscle. The muscle was retracted posteriorly and the carotid sheath was opened. The facial vein was ligated with 2-0 silk ties and divided. The common carotid artery was encircled with an umbilical tape and Rummel tourniquet. Dissection was continued onto the carotid bifurcation. The superior thyroid artery was controlled with a 2-0 silk Potts tie. The external carotid organ was encircled with a vessel loop and the internal carotid was encircled with umbilical tape and Rummel tourniquet. The hypoglossal and vagus nerves were identified and preserved.  The patient was given systemic heparinization. After adequate circulation time, the internal,external and common carotid arteries were occluded. The common carotid was opened with an 11 blade and the arteriotomy was continued with Potts scissors onto the internal carotid artery. A 10 shunt was passed up the internal carotid artery, allowed to back bleed, and then passed down the common carotid artery. The shunt was secured with Rummel tourniquet. The endarterectomy was begun on the common carotid artery  plaque was divided proximally with Potts scissors. The endarterectomy was continued onto the carotid bifurcation. The external carotid was  endarterectomized by eversion technique and the internal carotid artery was endarterectomized in an open fashion. Remaining debris was removed from the endarterectomy plane. A Dacron patch was brought to the field and sewn as a patch angioplasty. Prior to completing the anastomosis, the shunt was removed and the usual flushing maneuvers were undertaken. The anastomosis was then completed and flow was restored first to the external and then the internal carotid artery. Excellent flow characteristics were noted with hand-held Doppler in the internal and external carotid arteries.  The patient was given protamine to reverse the heparin. Hemostasis was obtained with electrocautery. The wounds were irrigated with saline. The wound was closed by first reapproximating the sternocleidomastoid muscle over the carotid artery with interrupted 3-0 Vicryl sutures. Next, the platysma was closed with a running 3-0 Vicryl suture. The skin was closed with a 4-0 subcuticular Vicryl suture. Benzoin and Steri-Strips were applied to the incision. A sterile dressing was placed over the incision. All sponge and needle counts were correct. The patient was awakened in the operating room, neurologically intact. They were transferred to the PACU in stable condition.  Carotid stenosis at surgery: Greater than 80%  Disposition:  To PACU in stable condition,neurologically intact  Relevant Operative Details:  Irregular greater than 80% stenosis  Rosetta Posner, M.D. [facility] [place] Office: [phone] Pager:  [phone]

## 2014-09-28 NOTE — H&P (View-Only) (Signed)
[facility] [place]  Established Carotid Patient  History of Present Illness  [name] is a 81 y.o. ([birth year]) male who presents with chief complaint: left eye temporary blindness  Previous carotid studies demonstrated: RICA 40% stenosis with carotid endarterectomy 15 years ago, LICA 02-63% stenosis with symptoms.  Patient has has history of TIA or stroke symptom.  The patient has has had amaurosis fugax or monocular blindness on 09/17/2014.  The patient has not had facial drooping or hemiplegia.  The patient has not had receptive or expressive aphasia.    The patient's previous neurologic deficits have  Resolved.  Past medical history includes: PAD, CAD with MI 1979, He was a smoker, but quit in 1979, hypertension, TIA, Bilateral LE neuropathy-Foot Drop-  He wears AFO's bilaterally.  He takes 81 mg Asprin , Zocor, carvedilol daily.   Past Medical History  Diagnosis Date  . Carotid artery stenosis     without infarction  . Hypertension   . Chronic diastolic heart failure   . Edema   . Bronchitis, acute   . Mini stroke     20 yrs ago during cartodid surgery  . CHF (congestive heart failure)   . Myocardial infarction 1979    Dr Verl Blalock - Cardiologist  . Coronary artery disease     native vessel  . Shortness of breath     with exersition  . COPD (chronic obstructive pulmonary disease)     "mild"  . Sleep apnea     CPAP doesn't use know.  last tested > 5 years abd < 10..  . Stroke     "mild stroke Years ago"  . Cancer     prostate  . Arthritis   . Foot drop, right   . Neuromuscular disorder     neuropathy- LE  . Neuropathy   . Anemia    Past Surgical History  Procedure Laterality Date  . Penile prosthesis implant    . Cholecystectomy    . Right carotid endarterectomy    . Back surgery    . Lumbar laminectomy  03/24/10    L3-4 and L4-5  . Ankle surgery      bone infection  . Cardiac catheterization    . Appendectomy    . Joint replacement       Left  . Radioactive seed implant    . Pars plana vitrectomy  03/19/2012    Procedure: PARS PLANA VITRECTOMY WITH 25 GAUGE;  Surgeon: Hayden Pedro, MD;  Location: [place];  Service: Ophthalmology;  Laterality: Right;  . Carotid endarterectomy Right     cea- 15 yrs ago  . Spine surgery     History   Social History  . Marital Status: Married    Spouse Name: N/A    Number of Children: N/A  . Years of Education: N/A   Occupational History  . Not on file.   Social History Main Topics  . Smoking status: Former Smoker -- 1.00 packs/day for 20 years    Types: Cigarettes    Quit date: 08/14/1977  . Smokeless tobacco: Never Used  . Alcohol Use: No     Comment: occasional  . Drug Use: No  . Sexual Activity: Not on file   Other Topics Concern  . Not on file   Social History Narrative   Family History  Problem Relation Age of Onset  . Thalassemia      Family history  . Cancer Mother 55    bone  cancer  . Diabetes Mother   . Heart disease Mother   . Heart attack Mother   . Hypertension Mother   . Stroke Father 44  . Heart disease Father   . Hypertension Father   . Hypertension Sister   . Diabetes Sister   . Heart disease Brother   . Heart attack Brother 5  . Cancer Brother    Current Outpatient Prescriptions on File Prior to Visit  Medication Sig Dispense Refill  . acetaminophen (TYLENOL) 325 MG tablet Take 650 mg by mouth as needed.    Marland Kitchen albuterol (VENTOLIN HFA) 108 (90 BASE) MCG/ACT inhaler Inhale 2 puffs into the lungs every 4 (four) hours as needed for wheezing or shortness of breath. 1 Inhaler 11  . aspirin 81 MG tablet Take 81 mg by mouth daily.    . budesonide-formoterol (SYMBICORT) 160-4.5 MCG/ACT inhaler Take 2 puffs first thing in am and then another 2 puffs about 12 hours later. 1 Inhaler 11  . carvedilol (COREG) 12.5 MG tablet 1/2 tab by mouth twice daily    . dextromethorphan-guaiFENesin (MUCINEX DM) 30-600 MG per 12 hr tablet Take 1 tablet by mouth 2 (two)  times daily as needed (cough, congestion).    . famotidine (PEPCID) 20 MG tablet Take 20 mg by mouth at bedtime.    . furosemide (LASIX) 20 MG tablet TAKE 1 TABLET BY MOUTH DAILY. 90 tablet 3  . gemfibrozil (LOPID) 600 MG tablet Take 600 mg by mouth 2 (two) times daily before a meal.    . meclizine (ANTIVERT) 25 MG tablet Take as directed when needed for dizziness    . mirabegron ER (MYRBETRIQ) 50 MG TB24 Take 50 mg by mouth at bedtime.     . nitroGLYCERIN (NITROSTAT) 0.4 MG SL tablet Place 1 tablet (0.4 mg total) under the tongue every 5 (five) minutes as needed. For chest pain 25 tablet 5  . omeprazole (PRILOSEC) 20 MG capsule Take 20 mg by mouth 2 (two) times daily before a meal.    . polyethylene glycol powder (MIRALAX) powder Take 17 g by mouth daily as needed. For constipation    . pregabalin (LYRICA) 75 MG capsule Take 75 mg by mouth daily.    . simvastatin (ZOCOR) 10 MG tablet Take 1 tablet (10 mg total) by mouth daily. 30 tablet 6  . Vitamin D, Ergocalciferol, (DRISDOL) 50000 UNITS CAPS capsule Take 50,000 Units by mouth every Friday.     No current facility-administered medications on file prior to visit.   Allergies  Allergen Reactions  . Amoxicillin     REACTION: unspecified  . Clopidogrel Bisulfate Hives    REACTION: red blue, black blotches  . Dilaudid [Hydromorphone Hcl]   . Penicillins   . Sulfa Antibiotics   . Sulfamethoxazole     REACTION: unspecified  . Sulfonamide Derivatives      ROS: [x]  Positive   [ ]  Negative   [ ]  All sytems reviewed and are negative  General: [ ]  Weight loss, [ ]  Fever, [ ]  chills Neurologic: [ ]  Dizziness, [ ]  Blackouts, [ ]  Seizure [ ]  Stroke, [ ]  "Mini stroke", [ ]  Slurred speech, [ ]  Temporary blindness; [ ]  weakness in arms or legs, [ ]  Hoarseness Cardiac: [ ]  Chest pain/pressure, [ ]  Shortness of breath at rest [ ]  Shortness of breath with exertion, [ ]  Atrial fibrillation or irregular heartbeat Vascular: [ ]  Pain in legs with  walking, [ ]  Pain in legs at rest, [ ]   Pain in legs at night,  [ ]  Non-healing ulcer, [ ]  Blood clot in vein/DVT,   Pulmonary: [ ]  Home oxygen, [ ]  Productive cough, [ ]  Coughing up blood, [ ]  Asthma,  [ ]  Wheezing Musculoskeletal:  [ ]  Arthritis, [ ]  Low back pain, [ ]  Joint pain Hematologic: [ ]  Easy Bruising, [ ]  Anemia; [ ]  Hepatitis Gastrointestinal: [ ]  Blood in stool, [ ]  Gastroesophageal Reflux/heartburn, [ ]  Trouble swallowing Urinary: [ ]  chronic Kidney disease, [ ]  on HD - [ ]  MWF or [ ]  TTHS, [ ]  Burning with urination, [ ]  Difficulty urinating Skin: [ ]  Rashes, [ ]  Wounds Psychological: [ ]  Anxiety, [ ]  Depression  Physical Examination  Filed Vitals:   09/22/14 1438 09/22/14 1440  BP: 112/48 118/52  Pulse: 66   Height: 6' (1.829 m)   Weight: 169 lb (76.658 kg)   SpO2: 98%    Body mass index is 22.92 kg/(m^2).  General: A&O x 3, WD  Eyes: PERRLA, EOMI,   Neck: Supple,   Pulmonary: Sym exp, good air movt, CTAB, no rales, rhonchi, & wheezing  Cardiac: RRR, Nl S1, S2, no Murmurs, rubs or gallops Vascular: Vessel Right Left  Radial Palpable Palpable  Brachial Palpable Palpable  Carotid Palpable, without bruit Palpable, without bruit  Aorta Not palpable N/A  Femoral Palpable Palpable  Popliteal Not palpable Not palpable  PT Palpable Palpable  DP Palpable Palpable   Gastrointestinal: soft, NTND  Musculoskeletal: M/S 5/5 throughout except bilateral foot drop, Extremities without ischemic changes   Neurologic: CN 2-12 intact *, Pain and light touch intact in extremities to the ankles then decreased sensation.*, Motor exam as listed above  Non-Invasive Vascular Imaging  CAROTID DUPLEX (Date: 09/22/2014):  Right carotid < 40% stenosis s/p CEA 15 years ago Left prox ICA velocity 515 stenosis 60-79%    Medical Decision Making   [name] is a 81 y.o. male who presents with: Temporary left eye blindness that lasted 10-15 min. With 60-79% left carotid  stenosis.    Based on the patient's vascular studies and examination,Dr. Early suggested left CEA.    He is symptomatic and the peak systolic velocity of the ICA proximal did increase since his last visit on 08/18/2014.   Left carotid endarterectomy surgery Mon.Feb  4th 2016.  He has agreed to undergo surgery  He was seen by in conjunction with Dr. Donnetta Hutching in clinic today.  He discussed the advantages and disadvantages to surgery.    Theda Sers [name] [facility] PA-C [facility] [place] Office: [phone]    09/22/2014, 4:12 PM    I have examined the patient, reviewed and agree with above. Clear episode of amaurosis fugax left eye. Underwent repeat duplex today to rule out any thrombosis of his internal carotid artery. This does show a high-grade stenosis with elevated velocities since the study one month ago. Recommend left carotid endarterectomy for reduction of stroke risk. Explained the procedure. He has had prior surgery  With carotid endarterectomy in the right uneventful he is right carotid. We'll schedule surgery for Monday 09/28/2014  EARLY, TODD, MD 09/22/2014 4:52 PM

## 2014-09-28 NOTE — Anesthesia Procedure Notes (Signed)
Procedure Name: Intubation Date/Time: 09/28/2014 11:33 AM Performed by: Garrison Columbus T Pre-anesthesia Checklist: Patient identified, Emergency Drugs available, Suction available and Patient being monitored Patient Re-evaluated:Patient Re-evaluated prior to inductionOxygen Delivery Method: Circle system utilized Preoxygenation: Pre-oxygenation with 100% oxygen Intubation Type: IV induction Ventilation: Mask ventilation without difficulty and Oral airway inserted - appropriate to patient size Laryngoscope Size: Sabra Heck and 2 Grade View: Grade I Tube type: Oral Tube size: 7.5 mm Number of attempts: 1 Airway Equipment and Method: Stylet and Oral airway Placement Confirmation: ETT inserted through vocal cords under direct vision,  positive ETCO2 and breath sounds checked- equal and bilateral Secured at: 23 cm Tube secured with: Tape Dental Injury: Teeth and Oropharynx as per pre-operative assessment

## 2014-09-28 NOTE — Interval H&P Note (Signed)
History and Physical Interval Note:  09/28/2014 10:33 AM  [name]  has presented today for surgery, with the diagnosis of Left Internal Carotid Artery Stenosis I65.22  The various methods of treatment have been discussed with the patient and family. After consideration of risks, benefits and other options for treatment, the patient has consented to  Procedure(s): ENDARTERECTOMY CAROTID (Left) as a surgical intervention .  The patient's history has been reviewed, patient examined, no change in status, stable for surgery.  I have reviewed the patient's chart and labs.  Questions were answered to the patient's satisfaction.     Cashel Bellina

## 2014-09-28 NOTE — Anesthesia Postprocedure Evaluation (Signed)
  Anesthesia Post-op Note  Patient: [name]  Procedure(s) Performed: Procedure(s): Left Carotid Endarterectomy (Left) WITH DACRON PATCH ANGIOPLASTY (Left)  Patient Location: PACU  Anesthesia Type: General   Level of Consciousness: awake, alert  and oriented  Airway and Oxygen Therapy: Patient Spontanous Breathing  Post-op Pain: none  Post-op Assessment: Post-op Vital signs reviewed  Post-op Vital Signs: Reviewed  Last Vitals:  Filed Vitals:   09/28/14 1445  BP:   Pulse: 60  Temp:   Resp: 12    Complications: No apparent anesthesia complications

## 2014-09-28 NOTE — Progress Notes (Signed)
tounge midline

## 2014-09-28 NOTE — Progress Notes (Signed)
Facial symmetry good, no drop noted

## 2014-09-29 ENCOUNTER — Telehealth: Payer: Self-pay | Admitting: Vascular Surgery

## 2014-09-29 ENCOUNTER — Encounter (HOSPITAL_COMMUNITY): Payer: Self-pay | Admitting: Vascular Surgery

## 2014-09-29 LAB — BASIC METABOLIC PANEL
ANION GAP: 11 (ref 5–15)
BUN: 23 mg/dL (ref 6–23)
CO2: 25 mmol/L (ref 19–32)
CREATININE: 1.25 mg/dL (ref 0.50–1.35)
Calcium: 8.6 mg/dL (ref 8.4–10.5)
Chloride: 102 mmol/L (ref 96–112)
GFR calc Af Amer: 57 mL/min — ABNORMAL LOW (ref >90)
GFR calc non Af Amer: 50 mL/min — ABNORMAL LOW (ref >90)
Glucose, Bld: 124 mg/dL — ABNORMAL HIGH (ref 70–99)
POTASSIUM: 4.5 mmol/L (ref 3.5–5.1)
Sodium: 138 mmol/L (ref 135–145)

## 2014-09-29 LAB — CBC
HCT: 34.3 % — ABNORMAL LOW (ref 39.0–52.0)
Hemoglobin: 10.5 g/dL — ABNORMAL LOW (ref 13.0–17.0)
MCH: 19.5 pg — ABNORMAL LOW (ref 26.0–34.0)
MCHC: 30.6 g/dL (ref 30.0–36.0)
MCV: 63.6 fL — ABNORMAL LOW (ref 78.0–100.0)
Platelets: 133 10*3/uL — ABNORMAL LOW (ref 150–400)
RBC: 5.39 MIL/uL (ref 4.22–5.81)
RDW: 15.3 % (ref 11.5–15.5)
WBC: 6.9 10*3/uL (ref 4.0–10.5)

## 2014-09-29 MED ORDER — OXYCODONE-ACETAMINOPHEN 5-325 MG PO TABS: 1.0000 | ORAL_TABLET | Freq: Four times a day (QID) | ORAL | Status: DC | PRN

## 2014-09-29 NOTE — Telephone Encounter (Addendum)
-----   Message from Mena Goes, RN sent at 09/29/2014  9:20 AM EST ----- Regarding: Schedule   ----- Message -----    From: Alvia Grove, PA-C    Sent: 09/29/2014   7:58 AM      To: Vvs Charge Pool  S/p left CEA 09/28/14  F/u with Dr. Donnetta Hutching in 2 weeks  Thanks Maudie Mercury  09/29/14: lm for pt re appt, dpm

## 2014-09-29 NOTE — Discharge Summary (Signed)
[facility] Discharge Summary   Patient ID:  ESLI JERNIGAN MRN: [id number] DOB/AGE: [birth year] 81 y.o.  Admit date: 09/28/2014 Discharge date: 09/29/2014 Date of Surgery: 09/28/2014 Surgeon: Surgeon(s): Rosetta Posner, MD  Admission Diagnosis: Left Internal Carotid Artery Stenosis I65.22  Discharge Diagnoses:  Left Internal Carotid Artery Stenosis I65.22  Secondary Diagnoses: Past Medical History  Diagnosis Date  . Carotid artery stenosis     without infarction  . Hypertension   . Chronic diastolic heart failure   . Edema   . Bronchitis, acute   . Mini stroke     20 yrs ago during cartodid surgery  . CHF (congestive heart failure)   . Myocardial infarction 1979    Dr Verl Blalock - Cardiologist  . Coronary artery disease     native vessel  . Shortness of breath     with exersition  . COPD (chronic obstructive pulmonary disease)     "mild"  . Stroke     "mild stroke Years ago"  . Cancer     prostate  . Arthritis   . Foot drop, right   . Neuromuscular disorder     neuropathy- LE  . Neuropathy   . Anemia   . Urinary incontinence, male, stress     wears depends  . GERD (gastroesophageal reflux disease)   . Frequent use of laxatives     Miralax daily  . Thalassemia   . Vertigo, constant   . Wears dentures   . Deafness in left ear     wears hearing aide in right ear  . Sleep apnea     CPAP .  last tested > 5 years abd < 10.Marland Kitchen    Procedure(s): Left Carotid Endarterectomy WITH DACRON PATCH ANGIOPLASTY  Discharged Condition: good  HPI:  [name] is a 81 y.o. (05-15-1924) male who presents with chief complaint: left eye temporary blindness  Previous carotid studies demonstrated: RICA 40% stenosis with carotid endarterectomy 15 years ago, LICA 70-62% stenosis with symptoms. Patient has has history of TIA or stroke symptom. The patient has has had amaurosis fugax or monocular blindness on 09/17/2014. The patient has not had facial drooping or  hemiplegia. The patient has not had receptive or expressive aphasia.   The patient's previous neurologic deficits have Resolved.    Hospital Course:  [name] is a 81 y.o. male is S/P  Procedure(s): Left Carotid Endarterectomy WITH DACRON PATCH ANGIOPLASTY Post op day 1 patient is stable , without deficients, ambulating, taking PO's well and voided.  He will be discharged home.  Significant Diagnostic Studies: CBC Lab Results  Component Value Date   WBC 6.9 09/29/2014   HGB 10.5* 09/29/2014   HCT 34.3* 09/29/2014   MCV 63.6* 09/29/2014   PLT 133* 09/29/2014    BMET    Component Value Date/Time   NA 138 09/29/2014 0420   K 4.5 09/29/2014 0420   CL 102 09/29/2014 0420   CO2 25 09/29/2014 0420   GLUCOSE 124* 09/29/2014 0420   BUN 23 09/29/2014 0420   CREATININE 1.25 09/29/2014 0420   CREATININE 1.10 04/24/2011 1200   CALCIUM 8.6 09/29/2014 0420   GFRNONAA 50* 09/29/2014 0420   GFRAA 57* 09/29/2014 0420   COAG Lab Results  Component Value Date   INR 1.09 09/24/2014   INR 1.11 03/15/2010   INR 1.2 12/18/2008     Disposition:  Discharge to :Home Discharge Instructions    CAROTID Sugery: Call MD for difficulty swallowing or  speaking; weakness in arms or legs that is a new symtom; severe headache.  If you have increased swelling in the neck and/or  are having difficulty breathing, CALL 911    Complete by:  As directed      Call MD for:  redness, tenderness, or signs of infection (pain, swelling, bleeding, redness, odor or green/yellow discharge around incision site)    Complete by:  As directed      Call MD for:  severe or increased pain, loss or decreased feeling  in affected limb(s)    Complete by:  As directed      Call MD for:  temperature >100.5    Complete by:  As directed      Discharge wound care:    Complete by:  As directed   Wash over wounds (wash over strips) daily with soap and water. The strips will eventually fall off on their own. Do not pull  them off yourself.     Driving Restrictions    Complete by:  As directed   No driving for 2 weeks     Increase activity slowly    Complete by:  As directed   Walk with assistance use walker or cane as needed     Lifting restrictions    Complete by:  As directed   No lifting for 2 weeks     Resume previous diet    Complete by:  As directed             Medication List    TAKE these medications        albuterol 108 (90 BASE) MCG/ACT inhaler  Commonly known as:  VENTOLIN HFA  Inhale 2 puffs into the lungs every 4 (four) hours as needed for wheezing or shortness of breath.     aspirin 81 MG tablet  Take 81 mg by mouth daily.     budesonide-formoterol 160-4.5 MCG/ACT inhaler  Commonly known as:  SYMBICORT  Take 2 puffs first thing in am and then another 2 puffs about 12 hours later.     carvedilol 12.5 MG tablet  Commonly known as:  COREG  Take 6.25 mg by mouth 2 (two) times daily with a meal.     furosemide 20 MG tablet  Commonly known as:  LASIX  TAKE 1 TABLET BY MOUTH DAILY.     gemfibrozil 600 MG tablet  Commonly known as:  LOPID  Take 600 mg by mouth 2 (two) times daily before a meal.     meclizine 25 MG tablet  Commonly known as:  ANTIVERT  Take as directed when needed for dizziness     mirabegron ER 50 MG Tb24 tablet  Commonly known as:  MYRBETRIQ  Take 50 mg by mouth at bedtime.     MIRALAX powder  Generic drug:  polyethylene glycol powder  Take 17 g by mouth daily as needed. For constipation     nitroGLYCERIN 0.4 MG SL tablet  Commonly known as:  NITROSTAT  Place 1 tablet (0.4 mg total) under the tongue every 5 (five) minutes as needed. For chest pain     omeprazole 20 MG capsule  Commonly known as:  PRILOSEC  Take 20 mg by mouth 2 (two) times daily before a meal.     oxyCODONE-acetaminophen 5-325 MG per tablet  Commonly known as:  PERCOCET/ROXICET  Take 1-2 tablets by mouth every 6 (six) hours as needed for moderate pain.     pregabalin 75 MG  capsule  Commonly  known as:  LYRICA  Take 75 mg by mouth daily.     simvastatin 10 MG tablet  Commonly known as:  ZOCOR  Take 1 tablet (10 mg total) by mouth daily.     TYLENOL 325 MG tablet  Generic drug:  acetaminophen  Take 650 mg by mouth every 6 (six) hours as needed for mild pain.     Vitamin D (Ergocalciferol) 50000 UNITS Caps capsule  Commonly known as:  DRISDOL  Take 50,000 Units by mouth every Friday.       Verbal and written Discharge instructions given to the patient. Wound care per Discharge AVS     Follow-up Information    Follow up with EARLY, TODD, MD In 2 weeks.   Specialty:  Vascular Surgery   Why:  Our office will call you to arrange an appointment (sent)   Contact information:   [address] [place] [address] [phone]       Signed: Laurence Slate [facility] 09/29/2014, 10:15 AM  --- For VQI Registry use --- Instructions: Press F2 to tab through selections.  Delete question if not applicable.   Modified Rankin score at D/C (0-6): Rankin Score=0  IV medication needed for:  1. Hypertension: No 2. Hypotension: No  Post-op Complications: No  1. Post-op CVA or TIA: No  If yes: Event classification (right eye, left eye, right cortical, left cortical, verterobasilar, other):   If yes: Timing of event (intra-op, <6 hrs post-op, >=6 hrs post-op, unknown):   2. CN injury: No  If yes: CN  injuried   3. Myocardial infarction: No  If yes: Dx by (EKG or clinical, Troponin):   4.  CHF: No  5.  Dysrhythmia (new): No  6. Wound infection: No  7. Reperfusion symptoms: No  8. Return to OR: No  If yes: return to OR for (bleeding, neurologic, other CEA incision, other):   Discharge medications: Statin use:  Yes ASA use:  Yes Beta blocker use:  Yes ACE-Inhibitor use:  No  for medical reason   P2Y12 Antagonist use: [x ] None, [ ]  Plavix, [ ]  Plasugrel, [ ]  Ticlopinine, [ ]  Ticagrelor, [ ]  Other, [ ]  No for medical reason, [ ]  Non-compliant,  [ ]  Not-indicated Anti-coagulant use:  [x ] None, [ ]  Warfarin, [ ]  Rivaroxaban, [ ]  Dabigatran, [ ]  Other, [ ]  No for medical reason, [ ]  Non-compliant, [ ]  Not-indicated

## 2014-09-29 NOTE — Care Management Note (Signed)
    Page 1 of 1   09/29/2014     9:51:50 AM CARE MANAGEMENT NOTE 09/29/2014  Patient:  [name], [name]   Account Number:  [id number]  Date Initiated:  09/29/2014  Documentation initiated by:  Marvetta Gibbons  Subjective/Objective Assessment:   Pt admitted s/p Carotid     Action/Plan:   PTA pt lived at home with wife   Anticipated DC Date:  09/29/2014   Anticipated DC Plan:  HOME/SELF CARE         Choice offered to / List presented to:             Status of service:  Completed, signed off Medicare Important Message given?  NA - LOS <3 / Initial given by admissions (If response is "NO", the following Medicare IM given date fields will be blank) Date Medicare IM given:   Medicare IM given by:   Date Additional Medicare IM given:   Additional Medicare IM given by:    Discharge Disposition:  HOME/SELF CARE  Per UR Regulation:  Reviewed for med. necessity/level of care/duration of stay  If discussed at [place] of Stay Meetings, dates discussed:    Comments:

## 2014-09-29 NOTE — Progress Notes (Signed)
Utilization review completed.  

## 2014-09-29 NOTE — Progress Notes (Signed)
Pt d/c home per Md order, pt VSS, pt belonging found except hearing aid x1. Pt will check at home and call back if unable to find, d/c instructions given with prescriptions, family at [facility], pt and family verbalized understanding of d/c, all questions answered

## 2014-09-29 NOTE — Progress Notes (Signed)
   [facility] [place]  Subjective  - Doing very well.     Objective 130/85 73 97.5 F (36.4 C) (Oral) 17 92%  Intake/Output Summary (Last 24 hours) at 09/29/14 0950 Last data filed at 09/29/14 0900  Gross per 24 hour  Intake 1927.5 ml  Output   1000 ml  Net  927.5 ml    Eating, drinking without swallowing difficulty No tongue deviation, smile is symmetric Incision C/D/I No hematoma  Assessment/Planning: POD #1 CEA left Stable disposition, ambulated and voiding. Discharge home     Laurence Slate [facility] 09/29/2014 9:50 AM --  Laboratory Lab Results:  Recent Labs  09/28/14 1715 09/29/14 0420  WBC 7.0 6.9  HGB 10.6* 10.5*  HCT 33.6* 34.3*  PLT 136* 133*   BMET  Recent Labs  09/28/14 1715 09/29/14 0420  NA  --  138  K  --  4.5  CL  --  102  CO2  --  25  GLUCOSE  --  124*  BUN  --  23  CREATININE 1.32 1.25  CALCIUM  --  8.6    COAG Lab Results  Component Value Date   INR 1.09 09/24/2014   INR 1.11 03/15/2010   INR 1.2 12/18/2008   No results found for: PTT

## 2014-10-12 ENCOUNTER — Encounter: Payer: Self-pay | Admitting: Vascular Surgery

## 2014-10-13 ENCOUNTER — Ambulatory Visit (INDEPENDENT_AMBULATORY_CARE_PROVIDER_SITE_OTHER): Payer: Self-pay | Admitting: Vascular Surgery

## 2014-10-13 ENCOUNTER — Encounter: Payer: Self-pay | Admitting: Vascular Surgery

## 2014-10-13 VITALS — BP 143/70 | HR 64 | Resp 16 | Ht 72.0 in | Wt 168.0 lb

## 2014-10-13 DIAGNOSIS — Z48812 Encounter for surgical aftercare following surgery on the circulatory system: Secondary | ICD-10-CM

## 2014-10-13 DIAGNOSIS — I6523 Occlusion and stenosis of bilateral carotid arteries: Secondary | ICD-10-CM

## 2014-10-13 NOTE — Addendum Note (Signed)
Addended by: Mena Goes on: 10/13/2014 11:47 AM   Modules accepted: Orders

## 2014-10-13 NOTE — Progress Notes (Signed)
Here today for follow-up of his recent carotid endarterectomy for symptomatic disease. He had had the transient left eye blindness. He is doing quite well from surgery discharged home on postoperative day 1. He has had no difficulty since his discharge. Reports some chronic dizziness and chronic hearing loss. This was present preop and is not changed.  On physical exam his left neck incision is healing quite nicely. He does have normal healing ridge. He has no carotid bruits bilaterally He is intact grossly from a neurologic standpoint bilaterally  Impression and plan: Stable follow-up following left carotid endarterectomy for symptomatic disease on 09/28/2013. We will see him again in 6 months with repeat carotid duplex. He'll notify should he have any wound issues or neurologic deficits

## 2014-11-24 ENCOUNTER — Ambulatory Visit: Payer: Medicare Other | Admitting: Cardiology

## 2014-12-01 ENCOUNTER — Ambulatory Visit: Payer: Medicare Other | Admitting: Cardiology

## 2014-12-02 ENCOUNTER — Ambulatory Visit: Payer: Medicare Other | Admitting: Cardiology

## 2014-12-28 ENCOUNTER — Other Ambulatory Visit: Payer: Self-pay | Admitting: Cardiology

## 2015-01-08 ENCOUNTER — Encounter (HOSPITAL_COMMUNITY): Payer: Self-pay | Admitting: Emergency Medicine

## 2015-01-08 ENCOUNTER — Emergency Department (HOSPITAL_COMMUNITY): Payer: Medicare Other

## 2015-01-08 ENCOUNTER — Ambulatory Visit (HOSPITAL_COMMUNITY): Payer: Medicare Other

## 2015-01-08 ENCOUNTER — Inpatient Hospital Stay (HOSPITAL_COMMUNITY)
Admission: EM | Admit: 2015-01-08 | Discharge: 2015-01-14 | DRG: 291 | Disposition: A | Discharge status: Home / Self Care | Payer: Medicare Other | Attending: Pulmonary Disease | Admitting: Pulmonary Disease

## 2015-01-08 ENCOUNTER — Other Ambulatory Visit (HOSPITAL_COMMUNITY): Payer: Self-pay

## 2015-01-08 DIAGNOSIS — J9601 Acute respiratory failure with hypoxia: Secondary | ICD-10-CM | POA: Diagnosis present

## 2015-01-08 DIAGNOSIS — G629 Polyneuropathy, unspecified: Secondary | ICD-10-CM | POA: Diagnosis present

## 2015-01-08 DIAGNOSIS — M109 Gout, unspecified: Secondary | ICD-10-CM | POA: Diagnosis present

## 2015-01-08 DIAGNOSIS — Z87891 Personal history of nicotine dependence: Secondary | ICD-10-CM | POA: Diagnosis not present

## 2015-01-08 DIAGNOSIS — I129 Hypertensive chronic kidney disease with stage 1 through stage 4 chronic kidney disease, or unspecified chronic kidney disease: Secondary | ICD-10-CM | POA: Diagnosis present

## 2015-01-08 DIAGNOSIS — D649 Anemia, unspecified: Secondary | ICD-10-CM | POA: Diagnosis present

## 2015-01-08 DIAGNOSIS — Z9049 Acquired absence of other specified parts of digestive tract: Secondary | ICD-10-CM | POA: Diagnosis present

## 2015-01-08 DIAGNOSIS — Z7982 Long term (current) use of aspirin: Secondary | ICD-10-CM | POA: Diagnosis not present

## 2015-01-08 DIAGNOSIS — Z882 Allergy status to sulfonamides status: Secondary | ICD-10-CM | POA: Diagnosis not present

## 2015-01-08 DIAGNOSIS — Z880 Allergy status to penicillin: Secondary | ICD-10-CM

## 2015-01-08 DIAGNOSIS — I252 Old myocardial infarction: Secondary | ICD-10-CM

## 2015-01-08 DIAGNOSIS — I5023 Acute on chronic systolic (congestive) heart failure: Secondary | ICD-10-CM | POA: Diagnosis not present

## 2015-01-08 DIAGNOSIS — J810 Acute pulmonary edema: Secondary | ICD-10-CM | POA: Diagnosis not present

## 2015-01-08 DIAGNOSIS — N390 Urinary tract infection, site not specified: Secondary | ICD-10-CM | POA: Diagnosis not present

## 2015-01-08 DIAGNOSIS — D569 Thalassemia, unspecified: Secondary | ICD-10-CM | POA: Diagnosis present

## 2015-01-08 DIAGNOSIS — H9192 Unspecified hearing loss, left ear: Secondary | ICD-10-CM | POA: Diagnosis present

## 2015-01-08 DIAGNOSIS — J449 Chronic obstructive pulmonary disease, unspecified: Secondary | ICD-10-CM | POA: Diagnosis present

## 2015-01-08 DIAGNOSIS — R0602 Shortness of breath: Secondary | ICD-10-CM | POA: Diagnosis present

## 2015-01-08 DIAGNOSIS — Z515 Encounter for palliative care: Secondary | ICD-10-CM

## 2015-01-08 DIAGNOSIS — Z9842 Cataract extraction status, left eye: Secondary | ICD-10-CM | POA: Diagnosis not present

## 2015-01-08 DIAGNOSIS — E1165 Type 2 diabetes mellitus with hyperglycemia: Secondary | ICD-10-CM | POA: Diagnosis present

## 2015-01-08 DIAGNOSIS — J432 Centrilobular emphysema: Secondary | ICD-10-CM | POA: Diagnosis not present

## 2015-01-08 DIAGNOSIS — G4733 Obstructive sleep apnea (adult) (pediatric): Secondary | ICD-10-CM | POA: Diagnosis present

## 2015-01-08 DIAGNOSIS — I248 Other forms of acute ischemic heart disease: Secondary | ICD-10-CM | POA: Diagnosis not present

## 2015-01-08 DIAGNOSIS — N289 Disorder of kidney and ureter, unspecified: Secondary | ICD-10-CM | POA: Diagnosis not present

## 2015-01-08 DIAGNOSIS — M1990 Unspecified osteoarthritis, unspecified site: Secondary | ICD-10-CM | POA: Diagnosis present

## 2015-01-08 DIAGNOSIS — N189 Chronic kidney disease, unspecified: Secondary | ICD-10-CM | POA: Diagnosis present

## 2015-01-08 DIAGNOSIS — B9620 Unspecified Escherichia coli [E. coli] as the cause of diseases classified elsewhere: Secondary | ICD-10-CM | POA: Diagnosis present

## 2015-01-08 DIAGNOSIS — I509 Heart failure, unspecified: Secondary | ICD-10-CM

## 2015-01-08 DIAGNOSIS — I2510 Atherosclerotic heart disease of native coronary artery without angina pectoris: Secondary | ICD-10-CM | POA: Diagnosis present

## 2015-01-08 DIAGNOSIS — J9622 Acute and chronic respiratory failure with hypercapnia: Secondary | ICD-10-CM | POA: Diagnosis present

## 2015-01-08 DIAGNOSIS — J189 Pneumonia, unspecified organism: Secondary | ICD-10-CM | POA: Diagnosis present

## 2015-01-08 DIAGNOSIS — J15211 Pneumonia due to Methicillin susceptible Staphylococcus aureus: Secondary | ICD-10-CM | POA: Diagnosis not present

## 2015-01-08 DIAGNOSIS — E876 Hypokalemia: Secondary | ICD-10-CM | POA: Diagnosis not present

## 2015-01-08 DIAGNOSIS — Z881 Allergy status to other antibiotic agents status: Secondary | ICD-10-CM | POA: Diagnosis not present

## 2015-01-08 DIAGNOSIS — Z8673 Personal history of transient ischemic attack (TIA), and cerebral infarction without residual deficits: Secondary | ICD-10-CM | POA: Diagnosis not present

## 2015-01-08 DIAGNOSIS — Z888 Allergy status to other drugs, medicaments and biological substances status: Secondary | ICD-10-CM

## 2015-01-08 DIAGNOSIS — I255 Ischemic cardiomyopathy: Secondary | ICD-10-CM | POA: Diagnosis present

## 2015-01-08 DIAGNOSIS — R7989 Other specified abnormal findings of blood chemistry: Secondary | ICD-10-CM | POA: Diagnosis not present

## 2015-01-08 DIAGNOSIS — N179 Acute kidney failure, unspecified: Secondary | ICD-10-CM | POA: Diagnosis not present

## 2015-01-08 DIAGNOSIS — G9340 Encephalopathy, unspecified: Secondary | ICD-10-CM | POA: Diagnosis present

## 2015-01-08 DIAGNOSIS — K219 Gastro-esophageal reflux disease without esophagitis: Secondary | ICD-10-CM | POA: Diagnosis present

## 2015-01-08 DIAGNOSIS — Z8546 Personal history of malignant neoplasm of prostate: Secondary | ICD-10-CM

## 2015-01-08 DIAGNOSIS — Z961 Presence of intraocular lens: Secondary | ICD-10-CM | POA: Diagnosis present

## 2015-01-08 DIAGNOSIS — R0600 Dyspnea, unspecified: Secondary | ICD-10-CM | POA: Diagnosis not present

## 2015-01-08 DIAGNOSIS — I5043 Acute on chronic combined systolic (congestive) and diastolic (congestive) heart failure: Secondary | ICD-10-CM | POA: Diagnosis present

## 2015-01-08 DIAGNOSIS — I420 Dilated cardiomyopathy: Secondary | ICD-10-CM | POA: Diagnosis not present

## 2015-01-08 DIAGNOSIS — J9600 Acute respiratory failure, unspecified whether with hypoxia or hypercapnia: Secondary | ICD-10-CM | POA: Diagnosis present

## 2015-01-08 HISTORY — DX: Malignant neoplasm of prostate: C61

## 2015-01-08 HISTORY — DX: Ischemic cardiomyopathy: I25.5

## 2015-01-08 HISTORY — DX: Personal history of transient ischemic attack (TIA), and cerebral infarction without residual deficits: Z86.73

## 2015-01-08 HISTORY — DX: Essential (primary) hypertension: I10

## 2015-01-08 LAB — CBC WITH DIFFERENTIAL/PLATELET
BASOS ABS: 0 10*3/uL (ref 0.0–0.1)
BASOS PCT: 0 % (ref 0–1)
EOS PCT: 3 % (ref 0–5)
Eosinophils Absolute: 0.6 10*3/uL (ref 0.0–0.7)
HCT: 42.7 % (ref 39.0–52.0)
Hemoglobin: 12.9 g/dL — ABNORMAL LOW (ref 13.0–17.0)
LYMPHS ABS: 5.4 10*3/uL — AB (ref 0.7–4.0)
LYMPHS PCT: 28 % (ref 12–46)
MCH: 19.4 pg — ABNORMAL LOW (ref 26.0–34.0)
MCHC: 30.2 g/dL (ref 30.0–36.0)
MCV: 64.1 fL — ABNORMAL LOW (ref 78.0–100.0)
MONO ABS: 1 10*3/uL (ref 0.1–1.0)
MONOS PCT: 5 % (ref 3–12)
NEUTROS ABS: 12.4 10*3/uL — AB (ref 1.7–7.7)
Neutrophils Relative %: 64 % (ref 43–77)
Platelets: 273 10*3/uL (ref 150–400)
RBC: 6.66 MIL/uL — AB (ref 4.22–5.81)
RDW: 15.2 % (ref 11.5–15.5)
WBC: 19.4 10*3/uL — ABNORMAL HIGH (ref 4.0–10.5)

## 2015-01-08 LAB — I-STAT ARTERIAL BLOOD GAS, ED
Acid-base deficit: 16 mmol/L — ABNORMAL HIGH (ref 0.0–2.0)
Bicarbonate: 13 mEq/L — ABNORMAL LOW (ref 20.0–24.0)
O2 Saturation: 43 %
PCO2 ART: 46.9 mmHg — AB (ref 35.0–45.0)
Patient temperature: 97.3
TCO2: 14 mmol/L (ref 0–100)
pH, Arterial: 7.048 — CL (ref 7.350–7.450)
pO2, Arterial: 33 mmHg — CL (ref 80.0–100.0)

## 2015-01-08 LAB — I-STAT CHEM 8, ED
BUN: 38 mg/dL — AB (ref 6–20)
CHLORIDE: 108 mmol/L (ref 101–111)
Calcium, Ion: 1.21 mmol/L (ref 1.13–1.30)
Creatinine, Ser: 1.4 mg/dL — ABNORMAL HIGH (ref 0.61–1.24)
GLUCOSE: 207 mg/dL — AB (ref 65–99)
HCT: 48 % (ref 39.0–52.0)
HEMOGLOBIN: 16.3 g/dL (ref 13.0–17.0)
POTASSIUM: 4.5 mmol/L (ref 3.5–5.1)
Sodium: 142 mmol/L (ref 135–145)
TCO2: 24 mmol/L (ref 0–100)

## 2015-01-08 LAB — URINE MICROSCOPIC-ADD ON

## 2015-01-08 LAB — COMPREHENSIVE METABOLIC PANEL
ALT: 25 U/L (ref 17–63)
ANION GAP: 8 (ref 5–15)
AST: 45 U/L — AB (ref 15–41)
Albumin: 3.6 g/dL (ref 3.5–5.0)
Alkaline Phosphatase: 92 U/L (ref 38–126)
BUN: 28 mg/dL — ABNORMAL HIGH (ref 6–20)
CHLORIDE: 106 mmol/L (ref 101–111)
CO2: 27 mmol/L (ref 22–32)
CREATININE: 1.49 mg/dL — AB (ref 0.61–1.24)
Calcium: 8.9 mg/dL (ref 8.9–10.3)
GFR calc Af Amer: 46 mL/min — ABNORMAL LOW (ref >60)
GFR calc non Af Amer: 40 mL/min — ABNORMAL LOW (ref >60)
GLUCOSE: 202 mg/dL — AB (ref 65–99)
Potassium: 4.4 mmol/L (ref 3.5–5.1)
SODIUM: 141 mmol/L (ref 135–145)
TOTAL PROTEIN: 7.9 g/dL (ref 6.5–8.1)
Total Bilirubin: 1 mg/dL (ref 0.3–1.2)

## 2015-01-08 LAB — I-STAT CG4 LACTIC ACID, ED: LACTIC ACID, VENOUS: 2.25 mmol/L — AB (ref 0.5–2.0)

## 2015-01-08 LAB — URINALYSIS, ROUTINE W REFLEX MICROSCOPIC
BILIRUBIN URINE: NEGATIVE
Glucose, UA: NEGATIVE mg/dL
Ketones, ur: NEGATIVE mg/dL
Nitrite: NEGATIVE
PROTEIN: 100 mg/dL — AB
Specific Gravity, Urine: 1.026 (ref 1.005–1.030)
Urobilinogen, UA: 0.2 mg/dL (ref 0.0–1.0)
pH: 5 (ref 5.0–8.0)

## 2015-01-08 LAB — GLUCOSE, CAPILLARY
GLUCOSE-CAPILLARY: 194 mg/dL — AB (ref 65–99)
GLUCOSE-CAPILLARY: 195 mg/dL — AB (ref 65–99)
GLUCOSE-CAPILLARY: 180 mg/dL — AB (ref 65–99)
Glucose-Capillary: 211 mg/dL — ABNORMAL HIGH (ref 65–99)

## 2015-01-08 LAB — MRSA PCR SCREENING: MRSA by PCR: NEGATIVE

## 2015-01-08 LAB — TROPONIN I
TROPONIN I: 0.23 ng/mL — AB (ref <0.031)
Troponin I: 0.04 ng/mL — ABNORMAL HIGH (ref <0.031)
Troponin I: 0.11 ng/mL — ABNORMAL HIGH (ref <0.031)

## 2015-01-08 LAB — LACTIC ACID, PLASMA: Lactic Acid, Venous: 2.5 mmol/L (ref 0.5–2.0)

## 2015-01-08 LAB — PROTIME-INR
INR: 1.3 (ref 0.00–1.49)
Prothrombin Time: 16.3 seconds — ABNORMAL HIGH (ref 11.6–15.2)

## 2015-01-08 LAB — PROCALCITONIN

## 2015-01-08 LAB — LIPASE, BLOOD: Lipase: 15 U/L — ABNORMAL LOW (ref 22–51)

## 2015-01-08 LAB — I-STAT TROPONIN, ED: Troponin i, poc: 0.01 ng/mL (ref 0.00–0.08)

## 2015-01-08 LAB — BRAIN NATRIURETIC PEPTIDE
B Natriuretic Peptide: 569.6 pg/mL — ABNORMAL HIGH (ref 0.0–100.0)
B Natriuretic Peptide: 439.7 pg/mL — ABNORMAL HIGH (ref 0.0–100.0)

## 2015-01-08 LAB — MAGNESIUM: MAGNESIUM: 1.9 mg/dL (ref 1.7–2.4)

## 2015-01-08 LAB — PHOSPHORUS: Phosphorus: 3 mg/dL (ref 2.5–4.6)

## 2015-01-08 MED ORDER — ETOMIDATE 2 MG/ML IV SOLN
INTRAVENOUS | Status: DC | PRN
  Administered 2015-01-08: 20 mg via INTRAVENOUS

## 2015-01-08 MED ORDER — ALBUTEROL (5 MG/ML) CONTINUOUS INHALATION SOLN
INHALATION_SOLUTION | RESPIRATORY_TRACT | Status: AC
  Filled 2015-01-08: qty 20

## 2015-01-08 MED ORDER — HEPARIN SODIUM (PORCINE) 5000 UNIT/ML IJ SOLN: 5000.0000 [IU] | Freq: Three times a day (TID) | INTRAMUSCULAR | Status: DC

## 2015-01-08 MED ORDER — SODIUM CHLORIDE 0.9 % IV BOLUS (SEPSIS)
1000.0000 mL | Freq: Once | INTRAVENOUS | Status: AC
  Administered 2015-01-08: 1000 mL via INTRAVENOUS

## 2015-01-08 MED ORDER — DEXTROSE 5 % IV SOLN
2.0000 g | Freq: Three times a day (TID) | INTRAVENOUS | Status: DC
  Administered 2015-01-08 – 2015-01-11 (×10): 2 g via INTRAVENOUS
  Filled 2015-01-08 (×12): qty 2

## 2015-01-08 MED ORDER — PROPOFOL 1000 MG/100ML IV EMUL
INTRAVENOUS | Status: AC
  Filled 2015-01-08: qty 100

## 2015-01-08 MED ORDER — ROCURONIUM BROMIDE 50 MG/5ML IV SOLN
INTRAVENOUS | Status: DC | PRN
  Administered 2015-01-08: 100 mg via INTRAVENOUS

## 2015-01-08 MED ORDER — CETYLPYRIDINIUM CHLORIDE 0.05 % MT LIQD
7.0000 mL | Freq: Four times a day (QID) | OROMUCOSAL | Status: DC
  Administered 2015-01-08 – 2015-01-09 (×5): 7 mL via OROMUCOSAL

## 2015-01-08 MED ORDER — ROCURONIUM BROMIDE 50 MG/5ML IV SOLN: 100.0000 mg | Freq: Once | INTRAVENOUS | Status: AC

## 2015-01-08 MED ORDER — AZTREONAM 2 G IJ SOLR
2.0000 g | Freq: Three times a day (TID) | INTRAMUSCULAR | Status: DC
  Filled 2015-01-08 (×3): qty 2

## 2015-01-08 MED ORDER — ROCURONIUM BROMIDE 50 MG/5ML IV SOLN
INTRAVENOUS | Status: AC
  Filled 2015-01-08: qty 2

## 2015-01-08 MED ORDER — HEPARIN SODIUM (PORCINE) 5000 UNIT/ML IJ SOLN
5000.0000 [IU] | Freq: Three times a day (TID) | INTRAMUSCULAR | Status: DC
  Administered 2015-01-08 – 2015-01-12 (×13): 5000 [IU] via SUBCUTANEOUS
  Filled 2015-01-08 (×15): qty 1

## 2015-01-08 MED ORDER — LIDOCAINE HCL (CARDIAC) 20 MG/ML IV SOLN
INTRAVENOUS | Status: AC
  Filled 2015-01-08: qty 5

## 2015-01-08 MED ORDER — PRO-STAT SUGAR FREE PO LIQD
30.0000 mL | Freq: Two times a day (BID) | ORAL | Status: AC
  Administered 2015-01-08: 30 mL
  Filled 2015-01-08 (×2): qty 30

## 2015-01-08 MED ORDER — SODIUM CHLORIDE 0.9 % IV SOLN
250.0000 mL | INTRAVENOUS | Status: DC | PRN
  Administered 2015-01-09: 250 mL via INTRAVENOUS

## 2015-01-08 MED ORDER — SUCCINYLCHOLINE CHLORIDE 20 MG/ML IJ SOLN
INTRAMUSCULAR | Status: AC
  Filled 2015-01-08: qty 1

## 2015-01-08 MED ORDER — POTASSIUM CHLORIDE 20 MEQ/15ML (10%) PO SOLN
40.0000 meq | Freq: Once | ORAL | Status: AC
  Administered 2015-01-08: 40 meq
  Filled 2015-01-08: qty 30

## 2015-01-08 MED ORDER — CHLORHEXIDINE GLUCONATE 0.12 % MT SOLN
15.0000 mL | Freq: Two times a day (BID) | OROMUCOSAL | Status: DC
  Administered 2015-01-08 – 2015-01-09 (×3): 15 mL via OROMUCOSAL
  Filled 2015-01-08: qty 15

## 2015-01-08 MED ORDER — SENNOSIDES 8.8 MG/5ML PO SYRP
5.0000 mL | ORAL_SOLUTION | Freq: Two times a day (BID) | ORAL | Status: DC | PRN
  Filled 2015-01-08: qty 5

## 2015-01-08 MED ORDER — INSULIN ASPART 100 UNIT/ML ~~LOC~~ SOLN: 0.0000 [IU] | Freq: Three times a day (TID) | SUBCUTANEOUS | Status: DC

## 2015-01-08 MED ORDER — SIMVASTATIN 10 MG PO TABS
10.0000 mg | ORAL_TABLET | Freq: Every day | ORAL | Status: DC
  Administered 2015-01-08 – 2015-01-13 (×6): 10 mg
  Filled 2015-01-08 (×7): qty 1

## 2015-01-08 MED ORDER — BUDESONIDE 0.5 MG/2ML IN SUSP
0.5000 mg | Freq: Two times a day (BID) | RESPIRATORY_TRACT | Status: DC
  Administered 2015-01-08 – 2015-01-11 (×7): 0.5 mg via RESPIRATORY_TRACT
  Filled 2015-01-08 (×10): qty 2

## 2015-01-08 MED ORDER — HYDRALAZINE HCL 20 MG/ML IJ SOLN: 10.0000 mg | INTRAMUSCULAR | Status: DC | PRN

## 2015-01-08 MED ORDER — ALBUTEROL (5 MG/ML) CONTINUOUS INHALATION SOLN
10.0000 mg/h | INHALATION_SOLUTION | RESPIRATORY_TRACT | Status: DC
  Administered 2015-01-08: 08:00:00 via RESPIRATORY_TRACT

## 2015-01-08 MED ORDER — PNEUMOCOCCAL VAC POLYVALENT 25 MCG/0.5ML IJ INJ
0.5000 mL | INJECTION | INTRAMUSCULAR | Status: DC
  Filled 2015-01-08: qty 0.5

## 2015-01-08 MED ORDER — VANCOMYCIN HCL IN DEXTROSE 1-5 GM/200ML-% IV SOLN
1000.0000 mg | Freq: Once | INTRAVENOUS | Status: DC
  Filled 2015-01-08: qty 200

## 2015-01-08 MED ORDER — VANCOMYCIN HCL 10 G IV SOLR
1500.0000 mg | Freq: Once | INTRAVENOUS | Status: AC
  Administered 2015-01-08: 1500 mg via INTRAVENOUS
  Filled 2015-01-08: qty 1500

## 2015-01-08 MED ORDER — VANCOMYCIN HCL IN DEXTROSE 1-5 GM/200ML-% IV SOLN: 1000.0000 mg | INTRAVENOUS | Status: DC

## 2015-01-08 MED ORDER — FUROSEMIDE 10 MG/ML IJ SOLN
40.0000 mg | Freq: Two times a day (BID) | INTRAMUSCULAR | Status: DC
  Administered 2015-01-08 – 2015-01-10 (×5): 40 mg via INTRAVENOUS
  Filled 2015-01-08 (×7): qty 4

## 2015-01-08 MED ORDER — VITAL AF 1.2 CAL PO LIQD
1000.0000 mL | ORAL | Status: DC
  Administered 2015-01-08: 1000 mL
  Filled 2015-01-08 (×4): qty 1000

## 2015-01-08 MED ORDER — CARVEDILOL 6.25 MG PO TABS
6.2500 mg | ORAL_TABLET | Freq: Two times a day (BID) | ORAL | Status: DC
  Administered 2015-01-08 – 2015-01-09 (×2): 6.25 mg
  Filled 2015-01-08 (×4): qty 1

## 2015-01-08 MED ORDER — PANTOPRAZOLE SODIUM 40 MG IV SOLR
40.0000 mg | Freq: Every day | INTRAVENOUS | Status: DC
  Administered 2015-01-08: 40 mg via INTRAVENOUS
  Filled 2015-01-08 (×2): qty 40

## 2015-01-08 MED ORDER — FENTANYL CITRATE (PF) 100 MCG/2ML IJ SOLN
50.0000 ug | INTRAMUSCULAR | Status: DC | PRN
  Administered 2015-01-08 (×4): 50 ug via INTRAVENOUS
  Filled 2015-01-08 (×3): qty 2

## 2015-01-08 MED ORDER — VITAL HIGH PROTEIN PO LIQD
1000.0000 mL | ORAL | Status: DC
  Filled 2015-01-08 (×2): qty 1000

## 2015-01-08 MED ORDER — FENTANYL CITRATE (PF) 100 MCG/2ML IJ SOLN
50.0000 ug | INTRAMUSCULAR | Status: AC | PRN
  Administered 2015-01-08 – 2015-01-09 (×3): 50 ug via INTRAVENOUS
  Filled 2015-01-08 (×4): qty 2

## 2015-01-08 MED ORDER — LEVOFLOXACIN IN D5W 750 MG/150ML IV SOLN
750.0000 mg | INTRAVENOUS | Status: DC
  Administered 2015-01-10 – 2015-01-14 (×3): 750 mg via INTRAVENOUS
  Filled 2015-01-08 (×4): qty 150

## 2015-01-08 MED ORDER — IPRATROPIUM-ALBUTEROL 0.5-2.5 (3) MG/3ML IN SOLN
3.0000 mL | RESPIRATORY_TRACT | Status: DC
  Administered 2015-01-08 – 2015-01-09 (×7): 3 mL via RESPIRATORY_TRACT
  Filled 2015-01-08 (×7): qty 3

## 2015-01-08 MED ORDER — LEVOFLOXACIN IN D5W 750 MG/150ML IV SOLN
750.0000 mg | Freq: Once | INTRAVENOUS | Status: AC
  Administered 2015-01-08: 750 mg via INTRAVENOUS
  Filled 2015-01-08: qty 150

## 2015-01-08 MED ORDER — BISACODYL 10 MG RE SUPP: 10.0000 mg | Freq: Every day | RECTAL | Status: DC | PRN

## 2015-01-08 MED ORDER — PROPOFOL 1000 MG/100ML IV EMUL
5.0000 ug/kg/min | Freq: Once | INTRAVENOUS | Status: AC
  Administered 2015-01-08: 5 ug/kg/min via INTRAVENOUS

## 2015-01-08 MED ORDER — ETOMIDATE 2 MG/ML IV SOLN: 20.0000 mg | Freq: Once | INTRAVENOUS | Status: AC

## 2015-01-08 MED ORDER — INSULIN ASPART 100 UNIT/ML ~~LOC~~ SOLN
0.0000 [IU] | SUBCUTANEOUS | Status: DC
  Administered 2015-01-08: 5 [IU] via SUBCUTANEOUS
  Administered 2015-01-08 – 2015-01-09 (×5): 3 [IU] via SUBCUTANEOUS
  Administered 2015-01-09: 2 [IU] via SUBCUTANEOUS
  Administered 2015-01-10: 3 [IU] via SUBCUTANEOUS

## 2015-01-08 MED ORDER — ETOMIDATE 2 MG/ML IV SOLN
INTRAVENOUS | Status: AC
  Filled 2015-01-08: qty 20

## 2015-01-08 MED ORDER — ASPIRIN 81 MG PO CHEW
81.0000 mg | CHEWABLE_TABLET | Freq: Every day | ORAL | Status: DC
  Administered 2015-01-08 – 2015-01-14 (×7): 81 mg
  Filled 2015-01-08 (×8): qty 1

## 2015-01-08 NOTE — ED Notes (Addendum)
Per EMS, pt coming in today for SOB starting 1.5 hours ago. Pt reported cough x2 days. Pt working extremely hard to breath for EMS. Pt placed on C-pap by ems. Pt also given 10mg  of albuterol, 125 of solumedrol and 1mg  of Atrovent. Pt becoming increasingly unresponsive upon arrival Dr. Doy Mince at the bedside.

## 2015-01-08 NOTE — Progress Notes (Signed)
Patient presented to ED on CPAP by EMS.  Was placed on Bipap.  Patient began to become unresponsive.  Was intubated by ED physician for airway protection.  Bilateral breath sounds noted.  Positive color change noted.  Chest xray pending for tube placement.  Will continue to monitor.

## 2015-01-08 NOTE — Progress Notes (Signed)
Initial Nutrition Assessment  DOCUMENTATION CODES:  Not applicable  INTERVENTION:   Initiate TF via OGT with Vital AF 1.2 at 25 ml/h and Prostat 30 ml BID on day 1; on day 2, d/c Prostat and increase to goal rate of 60 ml/h (1440 ml per day) to provide 1728 kcals, 108 gm protein, 1168 ml free water daily.   NUTRITION DIAGNOSIS:  Inadequate oral intake related to inability to eat as evidenced by NPO status.   GOAL:  Patient will meet greater than or equal to 90% of their needs   MONITOR:  TF tolerance, Vent status, Labs, Weight trends  REASON FOR ASSESSMENT:  Consult Enteral/tube feeding initiation and management  ASSESSMENT:  Patient with GOLD III COPD. Presented to the ED on 5/27 with 2 day history of cough, developed acute onset of dyspnea. Required intubation in the ED.   Labs and medications reviewed. Patient's wife reports that patient likes to eat, he ate well and weight was stable PTA. No nutrition concerns PTA. Nutrition focused physical exam completed.  No muscle or subcutaneous fat depletion noticed.  Patient is currently intubated on ventilator support MV: 12.2 L/min Temp (24hrs), Avg:96.8 F (36 C), Min:95.7 F (35.4 C), Max:97.5 F (36.4 C)  Propofol: none  Height:  Ht Readings from Last 1 Encounters:  01/08/15 5\' 11"  (1.803 m)    Weight:  Wt Readings from Last 1 Encounters:  01/08/15 175 lb 0.7 oz (79.4 kg)    Ideal Body Weight:  78.2 kg  Wt Readings from Last 10 Encounters:  01/08/15 175 lb 0.7 oz (79.4 kg)  10/13/14 168 lb (76.204 kg)  09/24/14 167 lb 1.6 oz (75.796 kg)  09/22/14 169 lb (76.658 kg)  08/18/14 176 lb (79.833 kg)  01/27/14 176 lb (79.833 kg)  01/23/14 179 lb 9.6 oz (81.466 kg)  12/05/13 176 lb (79.833 kg)  11/17/13 176 lb 12.8 oz (80.196 kg)  11/13/13 176 lb (79.833 kg)    BMI:  Body mass index is 24.42 kg/(m^2).  Estimated Nutritional Needs:  Kcal:  1770  Protein:  110 gm  Fluid:  1.8-2 L  Skin:   Reviewed, no issues  Diet Order:  Diet NPO time specified  EDUCATION NEEDS:  No education needs identified at this time   Intake/Output Summary (Last 24 hours) at 01/08/15 1350 Last data filed at 01/08/15 0939  Gross per 24 hour  Intake   2150 ml  Output      0 ml  Net   2150 ml    Last BM:  Unknown   Molli Barrows, RD, LDN, [place] Pager [phone] After Hours Pager [phone]

## 2015-01-08 NOTE — Progress Notes (Addendum)
ANTIBIOTIC CONSULT NOTE - INITIAL  Pharmacy Consult for Vancomycin and Levaquin Indication: pneumonia  Allergies  Allergen Reactions  . Amoxicillin     REACTION: unspecified  . Clopidogrel Bisulfate Hives    REACTION: red blue, black blotches  . Dilaudid [Hydromorphone Hcl]   . Penicillins   . Sulfa Antibiotics   . Sulfamethoxazole     REACTION: unspecified  . Sulfonamide Derivatives     Patient Measurements: Weight: 167 lb 8.8 oz (76 kg)  Vital Signs: Temp: 97.3 F (36.3 C) (05/27 0802) Temp Source: Rectal (05/27 0802) BP: 103/59 mmHg (05/27 0737) Pulse Rate: 96 (05/27 0737) Intake/Output from previous day:   Intake/Output from this shift:    Labs:  Recent Labs  01/08/15 0739  HGB 16.3  CREATININE 1.40*   Estimated Creatinine Clearance: 38.5 mL/min (by C-G formula based on Cr of 1.4). No results for input(s): VANCOTROUGH, VANCOPEAK, VANCORANDOM, GENTTROUGH, GENTPEAK, GENTRANDOM, TOBRATROUGH, TOBRAPEAK, TOBRARND, AMIKACINPEAK, AMIKACINTROU, AMIKACIN in the last 72 hours.   Microbiology: No results found for this or any previous visit (from the past 720 hour(s)).  Medical History: Past Medical History  Diagnosis Date  . Carotid artery stenosis     without infarction  . Hypertension   . Chronic diastolic heart failure   . Edema   . Bronchitis, acute   . Mini stroke     20 yrs ago during cartodid surgery  . CHF (congestive heart failure)   . Myocardial infarction 1979    Dr Verl Blalock - Cardiologist  . Coronary artery disease     native vessel  . Shortness of breath     with exersition  . COPD (chronic obstructive pulmonary disease)     "mild"  . Stroke     "mild stroke Years ago"  . Cancer     prostate  . Arthritis   . Foot drop, right   . Neuromuscular disorder     neuropathy- LE  . Neuropathy   . Anemia   . Urinary incontinence, male, stress     wears depends  . GERD (gastroesophageal reflux disease)   . Frequent use of laxatives     Miralax  daily  . Thalassemia   . Vertigo, constant   . Wears dentures   . Deafness in left ear     wears hearing aide in right ear  . Sleep apnea     CPAP .  last tested > 5 years abd < 10..   Assessment: 81 y.o. male with shortness of breath, possible PNA, for empiric antibiotics  Goal of Therapy:  Vancomycin trough level 15-20 mcg/ml  Plan:  Vancomycin 1500 mg IV now, then 1 g IV q48h Levaquin 750 mg IV q48h  Abbott, Bronson Curb 01/08/2015,8:04 AM   Addendum: Adding Aztreonam - 2g IV q8h ordered by MD.  Will continue this dose and monitor renal function and cultures. Heide Guile, PharmD, BCPS-AQ ID Clinical Pharmacist Pager [phone]

## 2015-01-08 NOTE — ED Provider Notes (Signed)
CSN: [id number]     Arrival date & time 01/08/15  0272 History   First MD Initiated Contact with Patient 01/08/15 [phone]     Chief Complaint  Patient presents with  . Shortness of Breath     (Consider location/radiation/quality/duration/timing/severity/associated sxs/prior Treatment) HPI Comments: 81 yo male presenting with severe respiratory distress.  Unable to get any history form patient.  Level V Caveat due to altered mental status, respiratory distress.  Patient is a 81 y.o. male presenting with shortness of breath.  Shortness of Breath Severity:  Severe Onset quality:  Gradual Duration:  2 days (2) Timing:  Constant Progression:  Worsening Context comment:  Per EMS report, breathing became very labored this morning. Relieved by:  Nothing Ineffective treatments: CPAP and albuterol by EMS.   Past Medical History  Diagnosis Date  . Carotid artery stenosis     without infarction  . Hypertension   . Chronic diastolic heart failure   . Edema   . Bronchitis, acute   . Mini stroke     20 yrs ago during cartodid surgery  . CHF (congestive heart failure)   . Myocardial infarction 1979    Dr Verl Blalock - Cardiologist  . Coronary artery disease     native vessel  . Shortness of breath     with exersition  . COPD (chronic obstructive pulmonary disease)     "mild"  . Stroke     "mild stroke Years ago"  . Cancer     prostate  . Arthritis   . Foot drop, right   . Neuromuscular disorder     neuropathy- LE  . Neuropathy   . Anemia   . Urinary incontinence, male, stress     wears depends  . GERD (gastroesophageal reflux disease)   . Frequent use of laxatives     Miralax daily  . Thalassemia   . Vertigo, constant   . Wears dentures   . Deafness in left ear     wears hearing aide in right ear  . Sleep apnea     CPAP .  last tested > 5 years abd < 10.Marland Kitchen   Past Surgical History  Procedure Laterality Date  . Penile prosthesis implant    . Cholecystectomy    . Right carotid  endarterectomy    . Back surgery    . Lumbar laminectomy  03/24/10    L3-4 and L4-5  . Ankle surgery      bone infection  . Cardiac catheterization    . Appendectomy    . Joint replacement      Left  . Radioactive seed implant  2009  . Pars plana vitrectomy  03/19/2012    Procedure: PARS PLANA VITRECTOMY WITH 25 GAUGE;  Surgeon: Hayden Pedro, MD;  Location: [facility];  Service: Ophthalmology;  Laterality: Right;  . Carotid endarterectomy Right     cea- 15 yrs ago  . Spine surgery    . Eye surgery    . Cataract extraction w/ intraocular lens implant Left   . Endarterectomy Left 09/28/2014    Procedure: Left Carotid Endarterectomy;  Surgeon: Rosetta Posner, MD;  Location: [facility];  Service: Vascular;  Laterality: Left;  . Patch angioplasty Left 09/28/2014    Procedure: WITH DACRON PATCH ANGIOPLASTY;  Surgeon: Rosetta Posner, MD;  Location: [facility] OR;  Service: Vascular;  Laterality: Left;   Family History  Problem Relation Age of Onset  . Thalassemia      Family history  .  Cancer Mother 28    bone cancer  . Diabetes Mother   . Heart disease Mother   . Heart attack Mother   . Hypertension Mother   . Stroke Father 61  . Heart disease Father   . Hypertension Father   . Hypertension Sister   . Diabetes Sister   . Heart disease Brother   . Heart attack Brother 78  . Cancer Brother    History  Substance Use Topics  . Smoking status: Former Smoker -- 1.00 packs/day for 20 years    Types: Cigarettes    Quit date: 08/14/1977  . Smokeless tobacco: Never Used  . Alcohol Use: No     Comment: occasional    Review of Systems  Unable to perform ROS: Severe respiratory distress  Respiratory: Positive for shortness of breath.       Allergies  Amoxicillin; Clopidogrel bisulfate; Dilaudid; Penicillins; Sulfa antibiotics; Sulfamethoxazole; and Sulfonamide derivatives  Home Medications   Prior to Admission medications   Medication Sig Start Date End Date Taking? Authorizing Provider   acetaminophen (TYLENOL) 325 MG tablet Take 650 mg by mouth every 6 (six) hours as needed for mild pain.     Historical Provider, MD  albuterol (VENTOLIN HFA) 108 (90 BASE) MCG/ACT inhaler Inhale 2 puffs into the lungs every 4 (four) hours as needed for wheezing or shortness of breath. 01/23/14   Tanda Rockers, MD  aspirin 81 MG tablet Take 81 mg by mouth daily.    Historical Provider, MD  budesonide-formoterol (SYMBICORT) 160-4.5 MCG/ACT inhaler Take 2 puffs first thing in am and then another 2 puffs about 12 hours later. 01/23/14   Tanda Rockers, MD  carvedilol (COREG) 12.5 MG tablet Take 6.25 mg by mouth 2 (two) times daily with a meal.     Historical Provider, MD  furosemide (LASIX) 20 MG tablet TAKE 1 TABLET BY MOUTH DAILY. 12/28/14   Dorothy Spark, MD  gemfibrozil (LOPID) 600 MG tablet Take 600 mg by mouth 2 (two) times daily before a meal.    Historical Provider, MD  meclizine (ANTIVERT) 25 MG tablet Take as directed when needed for dizziness    Historical Provider, MD  mirabegron ER (MYRBETRIQ) 50 MG TB24 Take 50 mg by mouth at bedtime.     Historical Provider, MD  nitroGLYCERIN (NITROSTAT) 0.4 MG SL tablet Place 1 tablet (0.4 mg total) under the tongue every 5 (five) minutes as needed. For chest pain 11/17/13   Dorothy Spark, MD  omeprazole (PRILOSEC) 20 MG capsule Take 20 mg by mouth 2 (two) times daily before a meal.    Historical Provider, MD  oxyCODONE-acetaminophen (PERCOCET/ROXICET) 5-325 MG per tablet Take 1-2 tablets by mouth every 6 (six) hours as needed for moderate pain. Patient not taking: Reported on 10/13/2014 09/29/14   Alvia Grove, PA-C  polyethylene glycol powder (MIRALAX) powder Take 17 g by mouth daily as needed. For constipation    Historical Provider, MD  pregabalin (LYRICA) 75 MG capsule Take 75 mg by mouth daily.    Historical Provider, MD  simvastatin (ZOCOR) 10 MG tablet Take 1 tablet (10 mg total) by mouth daily. 06/08/14   Dorothy Spark, MD  Vitamin D,  Ergocalciferol, (DRISDOL) 50000 UNITS CAPS capsule Take 50,000 Units by mouth every Friday.    Historical Provider, MD   BP 198/82 mmHg  Pulse 89  Resp 20  SpO2 100% Physical Exam  Constitutional: He appears well-developed and well-nourished. He appears lethargic. No distress.  HENT:  Head: Normocephalic and atraumatic.  Mouth/Throat: Oropharynx is clear and moist.  Eyes: Conjunctivae are normal. Pupils are equal, round, and reactive to light. No scleral icterus.  Neck: Neck supple.  Cardiovascular: Normal rate, regular rhythm, normal heart sounds and intact distal pulses.   No murmur heard. Pulmonary/Chest: No stridor. Bradypnea noted. He is in respiratory distress. He has decreased breath sounds. He has wheezes. He has rales (coarse breath sounds throughout with very poor air movement. ).  Abdominal: Soft. He exhibits no distension. There is no tenderness.  Musculoskeletal: Normal range of motion. He exhibits no edema.  Neurological: He appears lethargic. GCS eye subscore is 4. GCS verbal subscore is 1. GCS motor subscore is 1.  Skin: Skin is warm and dry. No rash noted.  Psychiatric: He has a normal mood and affect. His behavior is normal.  Nursing note and vitals reviewed.   ED Course  CRITICAL CARE Performed by: Serita Grit Authorized by: Serita Grit Total critical care time: 40 minutes Critical care time was exclusive of separately billable procedures and treating other patients. Critical care was necessary to treat or prevent imminent or life-threatening deterioration of the following conditions: respiratory failure. Critical care was time spent personally by me on the following activities: development of treatment plan with patient or surrogate, discussions with consultants, evaluation of patient's response to treatment, examination of patient, obtaining history from patient or surrogate, ordering and performing treatments and interventions, ordering and review of  laboratory studies, ordering and review of radiographic studies, pulse oximetry, re-evaluation of patient's condition and review of old charts.  INTUBATION Date/Time: 01/08/2015 12:36 PM Performed by: Serita Grit Authorized by: Serita Grit Consent: The procedure was performed in an emergent situation. Indications: respiratory failure Intubation method: video-assisted Patient status: paralyzed (RSI) Preoxygenation: BVM (BPAP) Sedatives: etomidate Paralytic: rocuronium Tube size: 7.5 mm Tube type: cuffed Number of attempts: 1 Cricoid pressure: no Cords visualized: yes Post-procedure assessment: chest rise and CO2 detector Breath sounds: equal Cuff inflated: yes ETT to lip: 24 cm Tube secured with: ETT holder Chest x-ray interpreted by me and radiologist. Chest x-ray findings: endotracheal tube in appropriate position Patient tolerance: Patient tolerated the procedure well with no immediate complications   (including critical care time) Labs Review Labs Reviewed  CBC WITH DIFFERENTIAL/PLATELET - Abnormal; Notable for the following:    WBC 19.4 (*)    RBC 6.66 (*)    Hemoglobin 12.9 (*)    MCV 64.1 (*)    MCH 19.4 (*)    Neutro Abs 12.4 (*)    Lymphs Abs 5.4 (*)    All other components within normal limits  COMPREHENSIVE METABOLIC PANEL - Abnormal; Notable for the following:    Glucose, Bld 202 (*)    BUN 28 (*)    Creatinine, Ser 1.49 (*)    AST 45 (*)    GFR calc non Af Amer 40 (*)    GFR calc Af Amer 46 (*)    All other components within normal limits  PROTIME-INR - Abnormal; Notable for the following:    Prothrombin Time 16.3 (*)    All other components within normal limits  LIPASE, BLOOD - Abnormal; Notable for the following:    Lipase 15 (*)    All other components within normal limits  URINALYSIS, ROUTINE W REFLEX MICROSCOPIC (NOT AT [facility]) - Abnormal; Notable for the following:    APPearance CLOUDY (*)    Hgb urine dipstick SMALL (*)    Protein, ur 100  (*)  Leukocytes, UA LARGE (*)    All other components within normal limits  BRAIN NATRIURETIC PEPTIDE - Abnormal; Notable for the following:    B Natriuretic Peptide 569.6 (*)    All other components within normal limits  URINE MICROSCOPIC-ADD ON - Abnormal; Notable for the following:    Casts HYALINE CASTS (*)    All other components within normal limits  TROPONIN I - Abnormal; Notable for the following:    Troponin I 0.04 (*)    All other components within normal limits  LACTIC ACID, PLASMA - Abnormal; Notable for the following:    Lactic Acid, Venous 2.5 (*)    All other components within normal limits  BRAIN NATRIURETIC PEPTIDE - Abnormal; Notable for the following:    B Natriuretic Peptide 439.7 (*)    All other components within normal limits  GLUCOSE, CAPILLARY - Abnormal; Notable for the following:    Glucose-Capillary 195 (*)    All other components within normal limits  I-STAT CHEM 8, ED - Abnormal; Notable for the following:    BUN 38 (*)    Creatinine, Ser 1.40 (*)    Glucose, Bld 207 (*)    All other components within normal limits  I-STAT CG4 LACTIC ACID, ED - Abnormal; Notable for the following:    Lactic Acid, Venous 2.25 (*)    All other components within normal limits  I-STAT ARTERIAL BLOOD GAS, ED - Abnormal; Notable for the following:    pH, Arterial 7.048 (*)    pCO2 arterial 46.9 (*)    pO2, Arterial 33.0 (*)    Bicarbonate 13.0 (*)    Acid-base deficit 16.0 (*)    All other components within normal limits  MRSA PCR SCREENING  CULTURE, BLOOD (ROUTINE X 2)  CULTURE, BLOOD (ROUTINE X 2)  URINE CULTURE  CULTURE, RESPIRATORY (NON-EXPECTORATED)  CULTURE, RESPIRATORY (NON-EXPECTORATED)  PHOSPHORUS  MAGNESIUM  PROCALCITONIN  BLOOD GAS, ARTERIAL  TROPONIN I  TROPONIN I  I-STAT TROPOININ, ED  I-STAT CG4 LACTIC ACID, ED  I-STAT CG4 LACTIC ACID, ED    Imaging Review Dg Chest Port 1 View  01/08/2015   CLINICAL DATA:  Hypoxia  EXAM: PORTABLE CHEST - 1  VIEW  COMPARISON:  September 24, 2014  FINDINGS: Endotracheal tube tip is 6.6 cm above the carina. Nasogastric tube tip and side port are below the diaphragm with the side port seen in the stomach. No pneumothorax. There is generalized interstitial edema, superimposed on emphysematous change. There is mild scarring in the bases. There is patchy alveolar edema, primarily in the mid lung regions.  Heart size is normal. The pulmonary vascularity reflects underlying emphysema. No adenopathy.  IMPRESSION: Tube positions as described without pneumothorax. Findings felt to represent congestive heart failure superimposed on emphysematous change. Superimposed pneumonia cannot be excluded radiographically.   Electronically Signed   By: Lowella Grip III M.D.   On: 01/08/2015 07:54     EKG Interpretation None      EKG - sinus rhythm, rate 90, normal axis, normal intervals, nonspecific ST/T changes, which are new compared to prior.  MDM   Final diagnoses:  SOB (shortness of breath)  respiratory failure  CHF with acute exacerbation  Severe respiratory distress.  Initial suspicion is CHF due to peripheral edema.  However, also likely has component of COPD due to poor air movement.  PNA can't be excluded as he's also had purulent cough.  Needed intubation immediately.  Admitted by critical care.   Serita Grit, MD 01/08/15 [phone]

## 2015-01-08 NOTE — Care Management Note (Signed)
Case Management Note  Patient Details  Name: [name] MRN: [id number] Date of Birth: 03/10/1924  Subjective/Objective:  Admitted with resp failure - on vent.  Has wife and children.  Wife with dementia - someone is with the wife at all times                Action/Plan: CM will continue to follow.   Expected Discharge Date:                  Expected Discharge Plan:  [place]  In-House Referral:     Discharge planning Services     Post Acute Care Choice:    Choice offered to:     DME Arranged:    DME Agency:     HH Arranged:    [place] Agency:     Status of Service:  In process, will continue to follow  Medicare Important Message Given:    Date Medicare IM Given:    Medicare IM give by:    Date Additional Medicare IM Given:    Additional Medicare Important Message give by:     If discussed at [place] of Stay Meetings, dates discussed:    Additional Comments:  Vergie Living, RN 01/08/2015, 2:03 PM

## 2015-01-08 NOTE — Progress Notes (Signed)
Obtained venous sample when attempting to obtain ABG.  Went and [place] patient and obtained arterial blood.  Reran on the same Istat machine, however the only results that crossed over were the results of the venous blood sample.  ABG results are as follows: pH: 7.26, CO2: 51.7, PaO2:>450, HCO3: 23.2.  Marni Griffon made aware.  Ventilator changes made.  Will continue to monitor.

## 2015-01-08 NOTE — H&P (Signed)
PULMONARY / CRITICAL CARE MEDICINE   Name: [name] MRN: [id number] DOB: August 06, 1924    ADMISSION DATE:  01/08/2015 CONSULTATION DATE:  5/27  REFERRING MD :  wofford  CHIEF COMPLAINT:  Acute hypoxic respiratory failure   INITIAL PRESENTATION:  81 year old male w/ prior h/o diastolic HF ([facility]) and GOLD III COPD (wert). Admitted on 5/27 w/ working dx of acute on chronic hypercarbic resp failure in setting decompensated diastolic HF w/ pulmonary edema +/-CAP.   STUDIES:  ECHO 5/27>>>  SIGNIFICANT EVENTS:    HISTORY OF PRESENT ILLNESS:   This is a 81 year old w/ GOLD III COPD f/b wert, as well as chronic diastolic HF followed by [facility]. Presents to ED 5/27 w/ 2d h/o cough, not clear if productive, no other hx other than developed acute onset of dyspnea about 1.5 hrs prior to presentation. EMS called. On arrival by EMS he had marked accessory muscle use, + reported acute distress, he was placed on CPAP, given albuterol and solumedrol. By the time he arrived at ED he was essentially unresponsive. GCS 10, initial PCO2 51. Intubated by EDP, initial CXR w/ what looked like diffuse pulmonary edema. PCCM asked to admit   PAST MEDICAL HISTORY :   has a past medical history of Carotid artery stenosis; Hypertension; Chronic diastolic heart failure; Edema; Bronchitis, acute; Mini stroke; CHF (congestive heart failure); Myocardial infarction (1979); Coronary artery disease; Shortness of breath; COPD (chronic obstructive pulmonary disease); Stroke; Cancer; Arthritis; Foot drop, right; Neuromuscular disorder; Neuropathy; Anemia; Urinary incontinence, male, stress; GERD (gastroesophageal reflux disease); Frequent use of laxatives; Thalassemia; Vertigo, constant; Wears dentures; Deafness in left ear; and Sleep apnea.  has past surgical history that includes Penile prosthesis implant; Cholecystectomy; Right carotid endarterectomy; Back surgery; Lumbar laminectomy (03/24/10); Ankle surgery; Cardiac catheterization;  Appendectomy; Joint replacement; Radioactive seed implant (2009); Pars plana vitrectomy (03/19/2012); Carotid endarterectomy (Right); Spine surgery; Eye surgery; Cataract extraction w/ intraocular lens implant (Left); Endarterectomy (Left, 09/28/2014); and Patch angioplasty (Left, 09/28/2014). Prior to Admission medications   Medication Sig Start Date End Date Taking? Authorizing Provider  acetaminophen (TYLENOL) 325 MG tablet Take 650 mg by mouth every 6 (six) hours as needed for mild pain.     Historical Provider, MD  albuterol (VENTOLIN HFA) 108 (90 BASE) MCG/ACT inhaler Inhale 2 puffs into the lungs every 4 (four) hours as needed for wheezing or shortness of breath. 01/23/14   Tanda Rockers, MD  aspirin 81 MG tablet Take 81 mg by mouth daily.    Historical Provider, MD  budesonide-formoterol (SYMBICORT) 160-4.5 MCG/ACT inhaler Take 2 puffs first thing in am and then another 2 puffs about 12 hours later. 01/23/14   Tanda Rockers, MD  carvedilol (COREG) 12.5 MG tablet Take 6.25 mg by mouth 2 (two) times daily with a meal.     Historical Provider, MD  furosemide (LASIX) 20 MG tablet TAKE 1 TABLET BY MOUTH DAILY. 12/28/14   Dorothy Spark, MD  gemfibrozil (LOPID) 600 MG tablet Take 600 mg by mouth 2 (two) times daily before a meal.    Historical Provider, MD  meclizine (ANTIVERT) 25 MG tablet Take as directed when needed for dizziness    Historical Provider, MD  mirabegron ER (MYRBETRIQ) 50 MG TB24 Take 50 mg by mouth at bedtime.     Historical Provider, MD  nitroGLYCERIN (NITROSTAT) 0.4 MG SL tablet Place 1 tablet (0.4 mg total) under the tongue every 5 (five) minutes as needed. For chest pain 11/17/13  Dorothy Spark, MD  omeprazole (PRILOSEC) 20 MG capsule Take 20 mg by mouth 2 (two) times daily before a meal.    Historical Provider, MD  oxyCODONE-acetaminophen (PERCOCET/ROXICET) 5-325 MG per tablet Take 1-2 tablets by mouth every 6 (six) hours as needed for moderate pain. Patient not taking:  Reported on 10/13/2014 09/29/14   Alvia Grove, PA-C  polyethylene glycol powder (MIRALAX) powder Take 17 g by mouth daily as needed. For constipation    Historical Provider, MD  pregabalin (LYRICA) 75 MG capsule Take 75 mg by mouth daily.    Historical Provider, MD  simvastatin (ZOCOR) 10 MG tablet Take 1 tablet (10 mg total) by mouth daily. 06/08/14   Dorothy Spark, MD  Vitamin D, Ergocalciferol, (DRISDOL) 50000 UNITS CAPS capsule Take 50,000 Units by mouth every Friday.    Historical Provider, MD   Allergies  Allergen Reactions  . Amoxicillin     REACTION: unspecified  . Clopidogrel Bisulfate Hives    REACTION: red blue, black blotches  . Dilaudid [Hydromorphone Hcl]   . Penicillins   . Sulfa Antibiotics   . Sulfamethoxazole     REACTION: unspecified  . Sulfonamide Derivatives     FAMILY HISTORY:  indicated that his mother is deceased. He indicated that his father is deceased. He indicated that his sister is deceased. He indicated that two of his three brothers are alive.  SOCIAL HISTORY:  reports that he quit smoking about 37 years ago. His smoking use included Cigarettes. He has a 20 pack-year smoking history. He has never used smokeless tobacco. He reports that he does not drink alcohol or use illicit drugs.  REVIEW OF SYSTEMS:  Unable   VITAL SIGNS: Temp:  [95.7 F (35.4 C)-97.5 F (36.4 C)] 95.9 F (35.5 C) (05/27 0930) Pulse Rate:  [25-96] 76 (05/27 0930) Resp:  [18-30] 18 (05/27 0930) BP: (68-198)/(44-97) 173/75 mmHg (05/27 0930) SpO2:  [84 %-100 %] 97 % (05/27 0930) FiO2 (%):  [100 %] 100 % (05/27 0752) Weight:  [74.844 kg (165 lb)-76 kg (167 lb 8.8 oz)] 76 kg (167 lb 8.8 oz) (05/27 0737) HEMODYNAMICS:   VENTILATOR SETTINGS: Vent Mode:  [-] PRVC FiO2 (%):  [100 %] 100 % Set Rate:  [18 bmp] 18 bmp Vt Set:  [600 mL] 600 mL PEEP:  [5 cmH20] 5 cmH20 Plateau Pressure:  [23 cmH20] 23 cmH20 INTAKE / OUTPUT:  Intake/Output Summary (Last 24 hours) at 01/08/15  0951 Last data filed at 01/08/15 0830  Gross per 24 hour  Intake   1000 ml  Output      0 ml  Net   1000 ml    PHYSICAL EXAMINATION: General:  81 year old male, sedated on full vent support Neuro:  Sedated, minimally responsive no focal def  HEENT:  Orally intubated, mild JVD Cardiovascular:  rrr Lungs:  Diffuse coarse rhonchi  Abdomen:  Soft, no OM + bowel sounds Musculoskeletal:  Intact w/ good tone  Skin:  3+ pitting edema in LEs  LABS:  CBC  Recent Labs Lab 01/08/15 0725 01/08/15 0739  WBC 19.4*  --   HGB 12.9* 16.3  HCT 42.7 48.0  PLT 273  --    Coag's  Recent Labs Lab 01/08/15 0725  INR 1.30   BMET  Recent Labs Lab 01/08/15 0725 01/08/15 0739  NA 141 142  K 4.4 4.5  CL 106 108  CO2 27  --   BUN 28* 38*  CREATININE 1.49* 1.40*  GLUCOSE 202* 207*  Electrolytes  Recent Labs Lab 01/08/15 0725  CALCIUM 8.9   Sepsis Markers  Recent Labs Lab 01/08/15 0739  LATICACIDVEN 2.25*   ABG  Recent Labs Lab 01/08/15 0805  PHART 7.048*  PCO2ART 46.9*  PO2ART 33.0*   Liver Enzymes  Recent Labs Lab 01/08/15 0725  AST 45*  ALT 25  ALKPHOS 92  BILITOT 1.0  ALBUMIN 3.6   Cardiac Enzymes No results for input(s): TROPONINI, PROBNP in the last 168 hours. Glucose No results for input(s): GLUCAP in the last 168 hours.  Imaging Dg Chest Port 1 View  01/08/2015   CLINICAL DATA:  Hypoxia  EXAM: PORTABLE CHEST - 1 VIEW  COMPARISON:  September 24, 2014  FINDINGS: Endotracheal tube tip is 6.6 cm above the carina. Nasogastric tube tip and side port are below the diaphragm with the side port seen in the stomach. No pneumothorax. There is generalized interstitial edema, superimposed on emphysematous change. There is mild scarring in the bases. There is patchy alveolar edema, primarily in the mid lung regions.  Heart size is normal. The pulmonary vascularity reflects underlying emphysema. No adenopathy.  IMPRESSION: Tube positions as described without  pneumothorax. Findings felt to represent congestive heart failure superimposed on emphysematous change. Superimposed pneumonia cannot be excluded radiographically.   Electronically Signed   By: Lowella Grip III M.D.   On: 01/08/2015 07:54  + curly B sign. C/w edema    ASSESSMENT / PLAN:  PULMONARY OETT 5/27 A: Acute on chronic hypercarbic Respiratory failure (PCO2 51 on admit, baseline calculated around 46), in setting of diffuse pulmonary edema +/- CAP.  H/o GOLD III COPD P:   Full vent support (ve adjusted after most recent ABG in progress note from RT) Scheduled BDs Scheduled ICS Sputum culture  PAD protocol  Diurese  ABG/CXR in am 5/28  CARDIOVASCULAR CVL A:  Decompensated acute on chronic diastolic HF H/o HTN Pulmonary edema  >concerned about new systolic dysfxn P:  KVO IVFs Lasix ECHO Cycle CEs Add back Coreg  Hydralazine for SBP >160 Will need cards involvement at some point to ensure adequate f/u  RENAL A:   Mild acute on chronic renal failure (looks like baseline cr ~1.2-1.3) Minimal lactic acidosis  P:   Trend chemistry  Lasix  Strict I&O Renal dose meds KVO IVFs   GASTROINTESTINAL A:   No acute P:   Start tube feeds PPI for SUP  HEMATOLOGIC A:   Mild leukocytosis boarderline anemia P:  Trend cbc [place] heparin Transfuse per protocol   INFECTIOUS A:  R/o CAP but favor heart failure  P:   BCx2 5/27>>> UC 5/27>>> Sputum 5/27>>> levaquin 5/27>>> azactam 5/27>>> PCT protocol  ENDOCRINE A:  DKA P:   SSI protocol  NEUROLOGIC A:   Acute encephalopathy  Deaf left ear P:   RASS goal: -2 PAD protocol   FAMILY  - Updates: pending   - Inter-disciplinary family meet or Palliative Care meeting due by:  June 2    TODAY'S SUMMARY:  Admit w/ working dx: acute on chronic HC resp failure in setting of decomp diastolic dysfxn. Have made vent adjustments. Will repeat ECHO, cycle CEs, push diuresis and will eventually need more  aggressive BP rx. Will add empiric abx but don't think he is infected.      [facility] [place] Pager: [phone]  01/08/2015, 9:51 AM

## 2015-01-08 NOTE — Progress Notes (Signed)
Patient transported from ED to 2M12 without any complications.  Also obtained sputum sample and sent down to main lab without any complications.

## 2015-01-09 ENCOUNTER — Inpatient Hospital Stay (HOSPITAL_COMMUNITY): Payer: Medicare Other

## 2015-01-09 DIAGNOSIS — R0600 Dyspnea, unspecified: Secondary | ICD-10-CM

## 2015-01-09 DIAGNOSIS — I248 Other forms of acute ischemic heart disease: Secondary | ICD-10-CM

## 2015-01-09 DIAGNOSIS — J432 Centrilobular emphysema: Secondary | ICD-10-CM

## 2015-01-09 LAB — POCT I-STAT 3, ART BLOOD GAS (G3+)
Acid-base deficit: 3 mmol/L — ABNORMAL HIGH (ref 0.0–2.0)
Bicarbonate: 19.2 mEq/L — ABNORMAL LOW (ref 20.0–24.0)
O2 Saturation: 99 %
TCO2: 20 mmol/L (ref 0–100)
pCO2 arterial: 24.3 mmHg — ABNORMAL LOW (ref 35.0–45.0)
pH, Arterial: 7.505 — ABNORMAL HIGH (ref 7.350–7.450)
pO2, Arterial: 139 mmHg — ABNORMAL HIGH (ref 80.0–100.0)

## 2015-01-09 LAB — BASIC METABOLIC PANEL
Anion gap: 12 (ref 5–15)
BUN: 34 mg/dL — AB (ref 6–20)
CO2: 21 mmol/L — AB (ref 22–32)
Calcium: 9 mg/dL (ref 8.9–10.3)
Chloride: 108 mmol/L (ref 101–111)
Creatinine, Ser: 1.55 mg/dL — ABNORMAL HIGH (ref 0.61–1.24)
GFR calc Af Amer: 44 mL/min — ABNORMAL LOW (ref >60)
GFR calc non Af Amer: 38 mL/min — ABNORMAL LOW (ref >60)
Glucose, Bld: 188 mg/dL — ABNORMAL HIGH (ref 65–99)
Potassium: 4.3 mmol/L (ref 3.5–5.1)
Sodium: 141 mmol/L (ref 135–145)

## 2015-01-09 LAB — CBC
HEMATOCRIT: 38.4 % — AB (ref 39.0–52.0)
HEMOGLOBIN: 11.9 g/dL — AB (ref 13.0–17.0)
MCH: 19.2 pg — ABNORMAL LOW (ref 26.0–34.0)
MCHC: 31 g/dL (ref 30.0–36.0)
MCV: 62 fL — AB (ref 78.0–100.0)
Platelets: 223 10*3/uL (ref 150–400)
RBC: 6.19 MIL/uL — AB (ref 4.22–5.81)
RDW: 15.1 % (ref 11.5–15.5)
WBC: 16.5 10*3/uL — ABNORMAL HIGH (ref 4.0–10.5)

## 2015-01-09 LAB — GLUCOSE, CAPILLARY
GLUCOSE-CAPILLARY: 165 mg/dL — AB (ref 65–99)
GLUCOSE-CAPILLARY: 69 mg/dL (ref 65–99)
Glucose-Capillary: 129 mg/dL — ABNORMAL HIGH (ref 65–99)
Glucose-Capillary: 138 mg/dL — ABNORMAL HIGH (ref 65–99)
Glucose-Capillary: 153 mg/dL — ABNORMAL HIGH (ref 65–99)
Glucose-Capillary: 193 mg/dL — ABNORMAL HIGH (ref 65–99)
Glucose-Capillary: 78 mg/dL (ref 65–99)
Glucose-Capillary: 89 mg/dL (ref 65–99)

## 2015-01-09 LAB — PHOSPHORUS: Phosphorus: 1 mg/dL — CL (ref 2.5–4.6)

## 2015-01-09 LAB — MAGNESIUM: Magnesium: 1.9 mg/dL (ref 1.7–2.4)

## 2015-01-09 LAB — PROCALCITONIN: Procalcitonin: 0.25 ng/mL

## 2015-01-09 MED ORDER — MIDAZOLAM HCL 2 MG/2ML IJ SOLN
INTRAMUSCULAR | Status: AC
  Filled 2015-01-09: qty 2

## 2015-01-09 MED ORDER — SODIUM PHOSPHATE 3 MMOLE/ML IV SOLN
30.0000 mmol | Freq: Once | INTRAVENOUS | Status: AC
  Administered 2015-01-09: 30 mmol via INTRAVENOUS
  Filled 2015-01-09: qty 10

## 2015-01-09 MED ORDER — POTASSIUM & SODIUM PHOSPHATES 280-160-250 MG PO PACK
2.0000 | PACK | Freq: Three times a day (TID) | ORAL | Status: AC
  Administered 2015-01-09 (×3): 2 via ORAL
  Filled 2015-01-09 (×3): qty 2

## 2015-01-09 MED ORDER — IPRATROPIUM-ALBUTEROL 0.5-2.5 (3) MG/3ML IN SOLN
3.0000 mL | Freq: Four times a day (QID) | RESPIRATORY_TRACT | Status: DC
  Administered 2015-01-09 – 2015-01-12 (×9): 3 mL via RESPIRATORY_TRACT
  Filled 2015-01-09 (×9): qty 3

## 2015-01-09 MED ORDER — CARVEDILOL 12.5 MG PO TABS
12.5000 mg | ORAL_TABLET | Freq: Two times a day (BID) | ORAL | Status: DC
  Administered 2015-01-09 – 2015-01-14 (×10): 12.5 mg
  Filled 2015-01-09 (×12): qty 1

## 2015-01-09 MED ORDER — MIDAZOLAM HCL 2 MG/2ML IJ SOLN
1.0000 mg | INTRAMUSCULAR | Status: DC | PRN
  Administered 2015-01-09 (×3): 2 mg via INTRAVENOUS
  Filled 2015-01-09 (×2): qty 2

## 2015-01-09 MED ORDER — PERFLUTREN LIPID MICROSPHERE
1.0000 mL | INTRAVENOUS | Status: AC | PRN
  Administered 2015-01-09: 1 mL via INTRAVENOUS
  Filled 2015-01-09: qty 10

## 2015-01-09 MED ORDER — ASPIRIN EC 81 MG PO TBEC: 81.0000 mg | DELAYED_RELEASE_TABLET | Freq: Every day | ORAL | Status: DC

## 2015-01-09 MED ORDER — PANTOPRAZOLE SODIUM 40 MG PO TBEC
40.0000 mg | DELAYED_RELEASE_TABLET | Freq: Every day | ORAL | Status: DC
  Administered 2015-01-09 – 2015-01-13 (×5): 40 mg via ORAL
  Filled 2015-01-09 (×5): qty 1

## 2015-01-09 NOTE — Progress Notes (Signed)
*  PRELIMINARY RESULTS* Echocardiogram 2D Echocardiogram has been performed.  Leavy Cella 01/09/2015, 1:04 PM

## 2015-01-09 NOTE — H&P (Signed)
PULMONARY / CRITICAL CARE MEDICINE   Name: [name] MRN: [id number] DOB: 07-Oct-1923    ADMISSION DATE:  01/08/2015 CONSULTATION DATE:  5/27  REFERRING MD :  wofford  CHIEF COMPLAINT:  Acute hypoxic respiratory failure   INITIAL PRESENTATION:  81 year old male w/ prior h/o diastolic HF ([facility]) and GOLD III COPD (wert). Admitted on 5/27 w/ working dx of acute on chronic hypercarbic resp failure in setting decompensated diastolic HF w/ pulmonary edema +/-CAP.   STUDIES:  ECHO 5/27>>>  SIGNIFICANT EVENTS: Extubated 5/28    HISTORY OF PRESENT ILLNESS:   This is a 81 year old w/ GOLD III COPD f/b wert, as well as chronic diastolic HF followed by [facility]. Presents to ED 5/27 w/ 2d h/o cough, not clear if productive, no other hx other than developed acute onset of dyspnea about 1.5 hrs prior to presentation. EMS called. On arrival by EMS he had marked accessory muscle use, + reported acute distress, he was placed on CPAP, given albuterol and solumedrol. By the time he arrived at ED he was essentially unresponsive. GCS 10, initial PCO2 51. Intubated by EDP, initial CXR w/ what looked like diffuse pulmonary edema. PCCM asked to admit    Subjective :  Pt sitting up in bed on vent , following commands Wants tube out Weaning well on 5/5 w/ good vol and sats     VITAL SIGNS: Temp:  [94.5 F (34.7 C)-99 F (37.2 C)] 98 F (36.7 C) (05/28 0820) Pulse Rate:  [69-105] 83 (05/28 0900) Resp:  [15-29] 16 (05/28 0900) BP: (62-186)/(36-141) 141/58 mmHg (05/28 0900) SpO2:  [89 %-100 %] 100 % (05/28 1125) FiO2 (%):  [30 %-40 %] 30 % (05/28 1125) Weight:  [172 lb 9.9 oz (78.3 kg)] 172 lb 9.9 oz (78.3 kg) (05/28 0333) HEMODYNAMICS:   VENTILATOR SETTINGS: Vent Mode:  [-] PSV;CPAP FiO2 (%):  [30 %-40 %] 30 % Set Rate:  [15 bmp-20 bmp] 15 bmp Vt Set:  [450 mL-600 mL] 450 mL PEEP:  [5 cmH20] 5 cmH20 Pressure Support:  [5 cmH20] 5 cmH20 Plateau Pressure:  [12 LGX21-19 cmH20] 12 cmH20 INTAKE /  OUTPUT:  Intake/Output Summary (Last 24 hours) at 01/09/15 1140 Last data filed at 01/09/15 0900  Gross per 24 hour  Intake 1410.17 ml  Output   2900 ml  Net -1489.83 ml    PHYSICAL EXAMINATION: General:  Nad, sitting up in bed on vent  Neuro:  Following commands , maew  HEENT:  Orally intubated, mild JVD Cardiovascular:  rrr Lungs:  Decreased BS in bases  Abdomen:  Soft, no OM + bowel sounds Musculoskeletal:  Intact w/ good tone  Skin:  1-2+ pitting edema in LEs  LABS:  CBC  Recent Labs Lab 01/08/15 0725 01/08/15 0739 01/09/15 0241  WBC 19.4*  --  16.5*  HGB 12.9* 16.3 11.9*  HCT 42.7 48.0 38.4*  PLT 273  --  223   Coag's  Recent Labs Lab 01/08/15 0725  INR 1.30   BMET  Recent Labs Lab 01/08/15 0725 01/08/15 0739 01/09/15 0241  NA 141 142 141  K 4.4 4.5 4.3  CL 106 108 108  CO2 27  --  21*  BUN 28* 38* 34*  CREATININE 1.49* 1.40* 1.55*  GLUCOSE 202* 207* 188*   Electrolytes  Recent Labs Lab 01/08/15 0725 01/08/15 1000 01/09/15 0241  CALCIUM 8.9  --  9.0  MG  --  1.9 1.9  PHOS  --  3.0 1.0*  Sepsis Markers  Recent Labs Lab 01/08/15 0739 01/08/15 1000 01/09/15 0241  LATICACIDVEN 2.25* 2.5*  --   PROCALCITON  --  <0.10 0.25   ABG  Recent Labs Lab 01/08/15 0805 01/09/15 0500  PHART 7.048* 7.505*  PCO2ART 46.9* 24.3*  PO2ART 33.0* 139.0*   Liver Enzymes  Recent Labs Lab 01/08/15 0725  AST 45*  ALT 25  ALKPHOS 92  BILITOT 1.0  ALBUMIN 3.6   Cardiac Enzymes  Recent Labs Lab 01/08/15 1000 01/08/15 1520 01/08/15 2050  TROPONINI 0.04* 0.11* 0.23*   Glucose  Recent Labs Lab 01/08/15 1157 01/08/15 1527 01/08/15 1941 01/09/15 0018 01/09/15 0321 01/09/15 0822  GLUCAP 195* 211* 180* 153* 193* 165*    Imaging Dg Chest Port 1 View  01/09/2015   CLINICAL DATA:  Acute respiratory failure  EXAM: PORTABLE CHEST - 1 VIEW  COMPARISON:  01/08/2015  FINDINGS: Endotracheal and nasogastric tubes are appropriately  positioned. Hazy perihilar opacities have improved since previously. Trace if any pleural fluid. Left costophrenic angle omitted from the field of view. Clips project over the neck. Glenohumeral degenerative change noted.  IMPRESSION: Improved perihilar aeration suggesting improving ARDS or pulmonary edema.   Electronically Signed   By: Conchita Paris M.D.   On: 01/09/2015 07:23     ASSESSMENT / PLAN:  PULMONARY OETT 5/27> 5/28  A: Acute on chronic hypercarbic Respiratory failure (PCO2 51 on admit, baseline calculated around 46), in setting of diffuse pulmonary edema +/- CAP.  H/o GOLD III COPD P:  Extubate  Scheduled BDs Scheduled ICS Sputum culture pend  Diurese as tol  O2 for sat >90%   CARDIOVASCULAR CVL A:  Decompensated acute on chronic diastolic HF H/o HTN Pulmonary edema  >concerned about new systolic dysfxn> echo pend  5/28 : troponin bump ?demand  P:  KVO IVFs Lasix as tolerated w/ neg  bal goal  ECHO pend   cont Coreg  Hydralazine for SBP >160 Consider cards consult   RENAL A:   Mild acute on chronic renal failure (looks like baseline cr ~1.2-1.3) Minimal lactic acidosis  Hypokalemia  Hypophos  P:   Trend chemistry  Lasix  Strict I&O Renal dose meds KVO IVFs Replace electrolytes   GASTROINTESTINAL A:   No acute P:   Advance diet as tolerated  PPI for SUP  HEMATOLOGIC A:   Mild leukocytosis boarderline anemia P:  Trend cbc [place] heparin Transfuse per protocol   INFECTIOUS A:  R/o CAP but favor heart failure  P:   BCx2 5/27>>> UC 5/27>>> Sputum 5/27>>> levaquin 5/27>>> azactam 5/27>>> PCT protocol   Narrow as cx return   ENDOCRINE A:  Hyperglycemia   P:   SSI protocol  NEUROLOGIC A:   Acute encephalopathy >resolved  Deaf left ear P:   RASS goal: na     FAMILY  - Updates: family updated 5/28   - Inter-disciplinary family meet or Palliative Care meeting due by:  June 2    TODAY'S SUMMARY:  Improved with diuresis  , weaning well. Proceed with extubation  I/O goal bal neg. Slight bump in troponin ? Demand  Consider cards consult .  Echo pending.     PARRETT,TAMMY NP-C  [facility] [place] Pager: [phone]  01/09/2015, 11:40 AM   Attending:  I have seen and examined the patient with nurse practitioner/resident and agree with the note above.   Feeling better post diuresis Lungs clear today, awake and alert CXR images reviewed> improved aeration of lungs  Troponin elevated  Acute respiratory failure > extubate, maintain lasix today Demand ischemia> repeat EKG now, continue to cycle troponins, increase home coreg to full dose, give asa, f/u echo, may need cardiology consult  My cc time 35 minutes  Roselie Awkward, MD [place] [facility] Pager: [phone] Cell: [phone] After 3pm or if no response, call [phone]

## 2015-01-09 NOTE — Procedures (Signed)
Extubation Procedure Note  Patient Details:   Name: [name] DOB: 07-22-1924 MRN: [id number]   Airway Documentation:     Evaluation  O2 sats: stable throughout Complications: No apparent complications Patient did tolerate procedure well. Bilateral Breath Sounds: Clear Suctioning: Airway Yes  Patient tolerated wean. MD ordered to extubate. Positive for cuff leak. Patient extubated to a 3 Lpm nasal cannula. No signs of dyspnea or stridor. Patient instructed on the Incentive Spirometer, achieving 1250 mL five times. Patient resting comfortably. Daughter at bedside.   Myrtie Neither 01/09/2015, 12:06 PM

## 2015-01-09 NOTE — Progress Notes (Signed)
CRITICAL VALUE ALERT  Critical value received:  Phosphorus 1.0  Date of notification:  01/09/15  Time of notification:  0500  Critical value read back:Yes  Nurse who received alert:  Deboraha Sprang, RN   MD notified Dr. Elsworth Soho  Time of first page:  0500

## 2015-01-09 NOTE — Progress Notes (Addendum)
[facility] Progress Note Patient Name: [name] DOB: [birth year] MRN: [id number]   Date of Service  01/09/2015  HPI/Events of Note  resp alkalosis  eICU Interventions  hypophos -repleted Lower RR to 15     Intervention Category Intermediate Interventions: Electrolyte abnormality - evaluation and management  Bambi Fehnel V. 01/09/2015, 5:03 AM

## 2015-01-10 ENCOUNTER — Encounter (HOSPITAL_COMMUNITY): Payer: Self-pay | Admitting: Cardiology

## 2015-01-10 ENCOUNTER — Inpatient Hospital Stay (HOSPITAL_COMMUNITY): Payer: Medicare Other

## 2015-01-10 DIAGNOSIS — I2510 Atherosclerotic heart disease of native coronary artery without angina pectoris: Secondary | ICD-10-CM

## 2015-01-10 DIAGNOSIS — I5043 Acute on chronic combined systolic (congestive) and diastolic (congestive) heart failure: Principal | ICD-10-CM

## 2015-01-10 DIAGNOSIS — R7989 Other specified abnormal findings of blood chemistry: Secondary | ICD-10-CM

## 2015-01-10 DIAGNOSIS — I255 Ischemic cardiomyopathy: Secondary | ICD-10-CM

## 2015-01-10 DIAGNOSIS — J449 Chronic obstructive pulmonary disease, unspecified: Secondary | ICD-10-CM

## 2015-01-10 DIAGNOSIS — I10 Essential (primary) hypertension: Secondary | ICD-10-CM

## 2015-01-10 LAB — CBC
HEMATOCRIT: 33.2 % — AB (ref 39.0–52.0)
HEMOGLOBIN: 10.6 g/dL — AB (ref 13.0–17.0)
MCH: 19.7 pg — AB (ref 26.0–34.0)
MCHC: 31.9 g/dL (ref 30.0–36.0)
MCV: 61.8 fL — ABNORMAL LOW (ref 78.0–100.0)
Platelets: 158 10*3/uL (ref 150–400)
RBC: 5.37 MIL/uL (ref 4.22–5.81)
RDW: 15 % (ref 11.5–15.5)
WBC: 11.4 10*3/uL — AB (ref 4.0–10.5)

## 2015-01-10 LAB — BASIC METABOLIC PANEL
Anion gap: 8 (ref 5–15)
BUN: 37 mg/dL — ABNORMAL HIGH (ref 6–20)
CO2: 26 mmol/L (ref 22–32)
CREATININE: 1.37 mg/dL — AB (ref 0.61–1.24)
Calcium: 8.4 mg/dL — ABNORMAL LOW (ref 8.9–10.3)
Chloride: 107 mmol/L (ref 101–111)
GFR calc Af Amer: 51 mL/min — ABNORMAL LOW (ref >60)
GFR calc non Af Amer: 44 mL/min — ABNORMAL LOW (ref >60)
Glucose, Bld: 106 mg/dL — ABNORMAL HIGH (ref 65–99)
POTASSIUM: 4.1 mmol/L (ref 3.5–5.1)
Sodium: 141 mmol/L (ref 135–145)

## 2015-01-10 LAB — URINE CULTURE: Colony Count: >=100000

## 2015-01-10 LAB — GLUCOSE, CAPILLARY
Glucose-Capillary: 95 mg/dL (ref 65–99)
Glucose-Capillary: 118 mg/dL — ABNORMAL HIGH (ref 65–99)
Glucose-Capillary: 168 mg/dL — ABNORMAL HIGH (ref 65–99)

## 2015-01-10 LAB — PROCALCITONIN: PROCALCITONIN: 0.15 ng/mL

## 2015-01-10 MED ORDER — FUROSEMIDE 10 MG/ML IJ SOLN
40.0000 mg | Freq: Every day | INTRAMUSCULAR | Status: DC
Start: 2015-01-11 — End: 2015-01-11
  Filled 2015-01-10: qty 4

## 2015-01-10 MED ORDER — LOSARTAN POTASSIUM 25 MG PO TABS
25.0000 mg | ORAL_TABLET | Freq: Every day | ORAL | Status: DC
  Administered 2015-01-10 – 2015-01-14 (×5): 25 mg via ORAL
  Filled 2015-01-10 (×5): qty 1

## 2015-01-10 MED ORDER — PREGABALIN 75 MG PO CAPS
75.0000 mg | ORAL_CAPSULE | Freq: Every day | ORAL | Status: DC
  Administered 2015-01-10 – 2015-01-14 (×5): 75 mg via ORAL
  Filled 2015-01-10 (×5): qty 1

## 2015-01-10 NOTE — Consult Note (Signed)
Primary cardiologist: Dr. Ena Dawley Consulting cardiologist: Dr. Satira Sark  Reason for consultation: Congestive heart failure  Clinical Summary Mr. Strike is an 81 y.o.male with past medical history outlined below, now admitted with acute on chronic hypercarbic respiratory failure requiring temporary ventilatory support. CXR was also concerning for associated pulmonary edema (ARDS not excluded). He is doing much better today, extubated, treated on the critical care team with antibiotics, Lasix, and nebulizers.  He reports worsening lower leg edema for weeks, no increasing shortness of breath however until the last few days. He also reports productive cough (yellow phlegm), but no fevers or chills. No angina symptoms or palpitations. He reports compliance with his medications (ASA, Coreg, Lasix, and Zocor at home). He was actually scheduled to follow-up for a routine visit with Dr. Meda Coffee this week.  Follow-up echocardiogram is noted below as is presenting ECG.  He has not undergone any recent interval ischemic testing. Cardiac catheterization in 2009 showed proximally occluded LAD with collaterals and moderate RCA disease - decision was to manage medically at that time.   Allergies  Allergen Reactions  . Amoxicillin     REACTION: unspecified  . Clopidogrel Bisulfate Hives    REACTION: red blue, black blotches  . Dilaudid [Hydromorphone Hcl]   . Penicillins   . Sulfa Antibiotics   . Sulfamethoxazole     REACTION: unspecified  . Sulfonamide Derivatives     Medications Scheduled Medications: . aspirin  81 mg Per Tube Daily  . aztreonam  2 g Intravenous 3 times per day  . budesonide (PULMICORT) nebulizer solution  0.5 mg Nebulization BID  . carvedilol  12.5 mg Per Tube BID WC  . [START ON 01/11/2015] furosemide  40 mg Intravenous Daily  . heparin  5,000 Units Subcutaneous 3 times per day  . ipratropium-albuterol  3 mL Nebulization Q6H  . levofloxacin (LEVAQUIN) IV   750 mg Intravenous Q48H  . pantoprazole  40 mg Oral Q1200  . pneumococcal 23 valent vaccine  0.5 mL Intramuscular Tomorrow-1000  . pregabalin  75 mg Oral Daily  . simvastatin  10 mg Per Tube q1800    PRN Medications: sodium chloride, bisacodyl, hydrALAZINE, sennosides   Past Medical History  Diagnosis Date  . Carotid artery stenosis   . Essential hypertension   . Myocardial infarction 1979  . Coronary artery disease     Known occluded proximal LAD with collaterals, moderate RCA disease - managed medically 2009  . COPD (chronic obstructive pulmonary disease)   . History of stroke   . Prostate cancer   . Arthritis   . Foot drop, right   . Neuropathy   . Anemia   . Urinary incontinence, male, stress   . GERD (gastroesophageal reflux disease)   . Thalassemia   . Wears dentures   . Deafness in left ear     Wears hearing aide in right ear  . Sleep apnea     CPAP  . Ischemic cardiomyopathy     LVEF 40-45%    Past Surgical History  Procedure Laterality Date  . Penile prosthesis implant    . Cholecystectomy    . Right carotid endarterectomy    . Back surgery    . Lumbar laminectomy  03/24/10    L3-4 and L4-5  . Ankle surgery      Bone infection  . Cardiac catheterization    . Appendectomy    . Joint replacement Left   . Radioactive seed implant  2009  .  Pars plana vitrectomy  03/19/2012    Procedure: PARS PLANA VITRECTOMY WITH 25 GAUGE;  Surgeon: Hayden Pedro, MD;  Location: [facility];  Service: Ophthalmology;  Laterality: Right;  . Carotid endarterectomy Right     CEA - 15 yrs ago  . Spine surgery    . Eye surgery    . Cataract extraction w/ intraocular lens implant Left   . Endarterectomy Left 09/28/2014    Procedure: Left Carotid Endarterectomy;  Surgeon: Rosetta Posner, MD;  Location: [facility];  Service: Vascular;  Laterality: Left;  . Patch angioplasty Left 09/28/2014    Procedure: WITH DACRON PATCH ANGIOPLASTY;  Surgeon: Rosetta Posner, MD;  Location: [facility] OR;  Service:  Vascular;  Laterality: Left;    Family History  Problem Relation Age of Onset  . Thalassemia      Family history  . Cancer Mother 62    Bone cancer  . Diabetes Mother   . Heart disease Mother   . Heart attack Mother   . Hypertension Mother   . Stroke Father 68  . Heart disease Father   . Hypertension Father   . Hypertension Sister   . Diabetes Sister   . Heart disease Brother   . Heart attack Brother 62  . Cancer Brother     Social History Mr. Bjelland reports that he quit smoking about 37 years ago. His smoking use included Cigarettes. He has a 20 pack-year smoking history. He has never used smokeless tobacco. Mr. Trudell reports that he does not drink alcohol.  Review of Systems Complete review of systems negative except as otherwise outlined in the clinical summary and also the following. Hard of hearing. Occasional left ankle pain.  Physical Examination Blood pressure 135/52, pulse 73, temperature 98.4 F (36.9 C), temperature source Oral, resp. rate 21, height 5\' 11"  (1.803 m), weight 162 lb 14.7 oz (73.9 kg), SpO2 94 %.  Intake/Output Summary (Last 24 hours) at 01/10/15 1455 Last data filed at 01/10/15 1400  Gross per 24 hour  Intake    720 ml  Output   4470 ml  Net  -3750 ml   Telemetry: Sinus rhythm.  Gen: Sitting in chair eating lunch. No chest pain. HEENT: Conjunctiva and lids normal, oropharynx clear. Neck: Supple, no elevated JVP, left carotid bruit, no thyromegaly. Lungs: Coarse breath sounds, nonlabored breathing at rest. Cardiac: Regular rate and rhythm, no S3 or significant systolic murmur, no pericardial rub. Abdomen: Soft, nontender, bowel sounds present, no guarding or rebound. Extremities: 2+ lower leg and ankle edema, distal pulses 2+. Skin: Warm and dry. Musculoskeletal: No kyphosis. Neuropsychiatric: Alert and oriented x3, affect grossly appropriate, decreased hearing.   Lab Results  Basic Metabolic Panel:  Recent Labs Lab 01/08/15 0725  01/08/15 0739 01/08/15 1000 01/09/15 0241 01/10/15 0241  NA 141 142  --  141 141  K 4.4 4.5  --  4.3 4.1  CL 106 108  --  108 107  CO2 27  --   --  21* 26  GLUCOSE 202* 207*  --  188* 106*  BUN 28* 38*  --  34* 37*  CREATININE 1.49* 1.40*  --  1.55* 1.37*  CALCIUM 8.9  --   --  9.0 8.4*  MG  --   --  1.9 1.9  --   PHOS  --   --  3.0 1.0*  --     Liver Function Tests:  Recent Labs Lab 01/08/15 0725  AST 45*  ALT 25  ALKPHOS 92  BILITOT 1.0  PROT 7.9  ALBUMIN 3.6    CBC:  Recent Labs Lab 01/08/15 0725 01/08/15 0739 01/09/15 0241 01/10/15 0241  WBC 19.4*  --  16.5* 11.4*  NEUTROABS 12.4*  --   --   --   HGB 12.9* 16.3 11.9* 10.6*  HCT 42.7 48.0 38.4* 33.2*  MCV 64.1*  --  62.0* 61.8*  PLT 273  --  223 158    Cardiac Enzymes:  Recent Labs Lab 01/08/15 1000 01/08/15 1520 01/08/15 2050  TROPONINI 0.04* 0.11* 0.23*    BNP: 439  ECG Sinus rhythm with evidence of prior anterolateral infarct, residual ST elevation, new nonspecific T wave changes.  Imaging Chest x-ray 01/10/2015: CLINICAL DATA: Followup pneumonia. Subsequent encounter.  EXAM: PORTABLE CHEST - 1 VIEW  COMPARISON: 01/09/2015  FINDINGS: Perihilar and lung base interstitial and hazy airspace opacities are without significant change from the previous day's study. No new lung abnormalities. No pneumothorax.  IMPRESSION: 1. No change from the previous day's study. 2. Persistent perihilar and lower lung zone interstitial and hazy airspace opacities, which may reflect pneumonia, edema or ARDS or a combination.  Echocardiogram 01/09/2015: Study Conclusions  - Left ventricle: The cavity size was normal. Wall thickness was increased in a pattern of mild LVH. Systolic function was mildly to moderately reduced. The estimated ejection fraction was in the range of 40% to 45%. There is akinesis of the mid-apicalanteroseptal and apical myocardium. There is akinesis of the  apicalinferior myocardium. Doppler parameters are consistent with abnormal left ventricular relaxation (grade 1 diastolic dysfunction). Doppler parameters are consistent with high ventricular filling pressure. - Aortic valve: Mildly calcified annulus. Trileaflet. - Mitral valve: Calcified annulus. Mildly calcified leaflets . - Left atrium: The atrium was mildly dilated. - Right atrium: Central venous pressure (est): 3 mm Hg. - Atrial septum: No defect or patent foramen ovale was identified. - Tricuspid valve: There was trivial regurgitation. - Pulmonary arteries: Systolic pressure could not be accurately estimated. - Pericardium, extracardiac: There was no pericardial effusion.  Impressions:  - Normal LV wall thickness with LVEF approximately 40-45%. Mid to apical anteroseptal and inferoapical akinesis suggests ischemic cardiomyopathy. Definity contrast shows swirling and slow flow at the apex, but no definitive mural thrombus. Grade 1 diastolic dysfunction with increased filling pressures. Mild left atrial enlargement. Trivial tricuspid regurgitation, unable to assess PASP.   Impression  1. Evidence of acute on chronic combined heart failure contributing to current presentation. LVEF 40-45% by follow-up echocardiogram with grade 1 diastolic dysfunction and increased filling pressures.  2. Mild increase in troponin I, peak of 0.23 so far. No active chest pain and ECG largely with chronic abnormalities, new nonspecific T wave changes. Most likely related to CHF rather than definitive ACS.  3. CAD as outlined above, managed medically as of 2009.  4. Ischemic cardiomyopathy.  5. COPD, GOLD III.  6. Essential hypertension.  7. OSA on CPAP.  8. Carotid artery disease status post left CEA in February of this year - no obvious cardiac complications at that time.   Recommendations  Continue ASA, Coreg, and Lasix. Will add low dose ARB as well - watch  creatinine. Although a follow-up Myoview would almost certainly be abnormal, it would be reasonable to consider once he is at baseline as a means of assessing overall ischemic burden. Further invasive cardiac testing would not likely be considered unless substantial ischemia noted and significant myocardium at risk.  Satira Sark, M.D., F.A.C.C.

## 2015-01-10 NOTE — Progress Notes (Signed)
Patient arrived to unit, Oriented to unit protocol  And placed on telemetry.

## 2015-01-10 NOTE — H&P (Signed)
PULMONARY / CRITICAL CARE MEDICINE   Name: [name] MRN: [id number] DOB: 07/04/1924    ADMISSION DATE:  01/08/2015 CONSULTATION DATE:  5/27  REFERRING MD :  wofford  CHIEF COMPLAINT:  Acute hypoxic respiratory failure   INITIAL PRESENTATION:  81 year old male w/ prior h/o diastolic HF (Wall) and GOLD III COPD (wert). Admitted on 5/27 w/ working dx of acute on chronic hypercarbic resp failure in setting decompensated diastolic HF w/ pulmonary edema +/-CAP.   STUDIES:  ECHO 5/27>>>EF 40-45%, Gr 1 DD , Akinesis apical /inferior  (Prev echo 25-30%)  SIGNIFICANT EVENTS: Extubated 5/28    HISTORY OF PRESENT ILLNESS:   This is a 81 year old w/ GOLD III COPD f/b wert, as well as chronic diastolic HF followed by Wall. Presents to ED 5/27 w/ 2d h/o cough, not clear if productive, no other hx other than developed acute onset of dyspnea about 1.5 hrs prior to presentation. EMS called. On arrival by EMS he had marked accessory muscle use, + reported acute distress, he was placed on CPAP, given albuterol and solumedrol. By the time he arrived at ED he was essentially unresponsive. GCS 10, initial PCO2 51. Intubated by EDP, initial CXR w/ what looked like diffuse pulmonary edema. PCCM asked to admit    Subjective :  Pt sitting up in chair  sats good on RA   Hungry wants to eat    VITAL SIGNS: Temp:  [97.8 F (36.6 C)-98.7 F (37.1 C)] 98.4 F (36.9 C) (05/29 1232) Pulse Rate:  [60-77] 73 (05/29 1200) Resp:  [14-26] 21 (05/29 1200) BP: (100-149)/(39-71) 135/52 mmHg (05/29 1200) SpO2:  [93 %-100 %] 94 % (05/29 1200) Weight:  [162 lb 14.7 oz (73.9 kg)] 162 lb 14.7 oz (73.9 kg) (05/29 0340) HEMODYNAMICS:   VENTILATOR SETTINGS:   INTAKE / OUTPUT:  Intake/Output Summary (Last 24 hours) at 01/10/15 1327 Last data filed at 01/10/15 1200  Gross per 24 hour  Intake    720 ml  Output   4495 ml  Net  -3775 ml    PHYSICAL EXAMINATION: General:  Nad, sitting up in chair  Neuro:   Following commands , maew  HEENT:   Dry mucosa  Cardiovascular:  rrr Lungs:  Decreased BS in bases  Abdomen:  Soft, no OM + bowel sounds Musculoskeletal:  Intact w/ good tone  Skin:  1-2+ pitting edema in LEs  LABS:  CBC  Recent Labs Lab 01/08/15 0725 01/08/15 0739 01/09/15 0241 01/10/15 0241  WBC 19.4*  --  16.5* 11.4*  HGB 12.9* 16.3 11.9* 10.6*  HCT 42.7 48.0 38.4* 33.2*  PLT 273  --  223 158   Coag's  Recent Labs Lab 01/08/15 0725  INR 1.30   BMET  Recent Labs Lab 01/08/15 0725 01/08/15 0739 01/09/15 0241 01/10/15 0241  NA 141 142 141 141  K 4.4 4.5 4.3 4.1  CL 106 108 108 107  CO2 27  --  21* 26  BUN 28* 38* 34* 37*  CREATININE 1.49* 1.40* 1.55* 1.37*  GLUCOSE 202* 207* 188* 106*   Electrolytes  Recent Labs Lab 01/08/15 0725 01/08/15 1000 01/09/15 0241 01/10/15 0241  CALCIUM 8.9  --  9.0 8.4*  MG  --  1.9 1.9  --   PHOS  --  3.0 1.0*  --    Sepsis Markers  Recent Labs Lab 01/08/15 0739 01/08/15 1000 01/09/15 0241 01/10/15 0241  LATICACIDVEN 2.25* 2.5*  --   --   PROCALCITON  --  <  0.10 0.25 0.15   ABG  Recent Labs Lab 01/08/15 0805 01/09/15 0500  PHART 7.048* 7.505*  PCO2ART 46.9* 24.3*  PO2ART 33.0* 139.0*   Liver Enzymes  Recent Labs Lab 01/08/15 0725  AST 45*  ALT 25  ALKPHOS 92  BILITOT 1.0  ALBUMIN 3.6   Cardiac Enzymes  Recent Labs Lab 01/08/15 1000 01/08/15 1520 01/08/15 2050  TROPONINI 0.04* 0.11* 0.23*   Glucose  Recent Labs Lab 01/09/15 1653 01/09/15 1948 01/09/15 2332 01/10/15 0339 01/10/15 0821 01/10/15 1233  GLUCAP 89 78 129* 95 118* 168*    Imaging Dg Chest Port 1 View  01/10/2015   CLINICAL DATA:  Followup pneumonia.  Subsequent encounter.  EXAM: PORTABLE CHEST - 1 VIEW  COMPARISON:  01/09/2015  FINDINGS: Perihilar and lung base interstitial and hazy airspace opacities are without significant change from the previous day's study. No new lung abnormalities. No pneumothorax.  IMPRESSION:  1. No change from the previous day's study. 2. Persistent perihilar and lower lung zone interstitial and hazy airspace opacities, which may reflect pneumonia, edema or ARDS or a combination.   Electronically Signed   By: Lajean Manes M.D.   On: 01/10/2015 07:28     ASSESSMENT / PLAN:  PULMONARY OETT 5/27> 5/28  A: Acute on chronic hypercarbic Respiratory failure (PCO2 51 on admit, baseline calculated around 46), in setting of diffuse pulmonary edema +/- CAP.  H/o GOLD III COPD P:   Scheduled BDs Scheduled ICS Sputum culture pend >few staph aur Decrease lasix  O2 for sat >90%   CARDIOVASCULAR CVL A:  Decompensated acute on chronic diastolic HF>echo 9/83 EF 38-25%, GR 1 DD (EF improved), akinesis apical/inferior  H/o HTN Pulmonary edema     troponin bump ?demand  P:  KVO IVFs Decrease lasix 40mg  daily   Cont Coreg  Hydralazine for SBP >160 Consider cards consult   RENAL A:   Mild acute on chronic renal failure (looks like baseline cr ~1.2-1.3) Minimal lactic acidosis  Hypokalemia >resolved  Hypophos >replaced  P:   Trend chemistry  Decrease Lasix  Strict I&O Renal dose meds KVO IVFs Check phos in am     GASTROINTESTINAL A:   GERD on home PPI  P:   Advance diet  PPI for GERD   HEMATOLOGIC A:   Mild leukocytosis>wbc tr down  boarderline anemia P:  Trend cbc [place] heparin Transfuse per protocol   INFECTIOUS A:  R/o CAP but favor heart failure  UTI  P:   BCx2 5/27>>> UC 5/27>>>GNR 100k >> Sputum 5/27>>>staph aur>>  levaquin 5/27>>> azactam 5/27>>> PCT protocol> PCT tr down       ENDOCRINE A:  Hyperglycemia   P:   D/c SSI  Follow BS on chem   NEUROLOGIC A:   Acute encephalopathy >resolved  Deaf left ear P:   Monitor     FAMILY  - Updates: family updated 5/28   - Inter-disciplinary family meet or Palliative Care meeting due by:  June 2    TODAY'S SUMMARY:  Improved with diuresis, Extubated yesterday .  Echo shows EF stable  40-45%, Gr 1 DD , apical kinesis  Transfer to Tele .    PARRETT,TAMMY NP-C  Pulmonary and [place] Pager: [phone]  01/10/2015, 1:27 PM  Attending:  I have seen and examined the patient with nurse practitioner/resident and agree with the note above.   [name] is feeling great, lungs clear, edema improved CXR images reviewed > improved edema bilaterally Consult  Cardiology today for CHF exacerbation Elevated troponin likely demand ischemia Move to floor, PCCM service D/c foley  Roselie Awkward, MD [place] [facility] Pager: [phone] Cell: [phone] After 3pm or if no response, call [phone]

## 2015-01-11 ENCOUNTER — Inpatient Hospital Stay (HOSPITAL_COMMUNITY): Payer: Medicare Other

## 2015-01-11 ENCOUNTER — Encounter (HOSPITAL_COMMUNITY): Payer: Self-pay | Admitting: Cardiology

## 2015-01-11 DIAGNOSIS — J189 Pneumonia, unspecified organism: Secondary | ICD-10-CM | POA: Insufficient documentation

## 2015-01-11 DIAGNOSIS — N390 Urinary tract infection, site not specified: Secondary | ICD-10-CM

## 2015-01-11 DIAGNOSIS — B9620 Unspecified Escherichia coli [E. coli] as the cause of diseases classified elsewhere: Secondary | ICD-10-CM

## 2015-01-11 DIAGNOSIS — J15211 Pneumonia due to Methicillin susceptible Staphylococcus aureus: Secondary | ICD-10-CM

## 2015-01-11 DIAGNOSIS — N289 Disorder of kidney and ureter, unspecified: Secondary | ICD-10-CM

## 2015-01-11 LAB — CULTURE, RESPIRATORY W GRAM STAIN

## 2015-01-11 LAB — PHOSPHORUS: Phosphorus: 3.3 mg/dL (ref 2.5–4.6)

## 2015-01-11 LAB — CBC
HCT: 32.3 % — ABNORMAL LOW (ref 39.0–52.0)
HEMOGLOBIN: 10.2 g/dL — AB (ref 13.0–17.0)
MCH: 19.4 pg — AB (ref 26.0–34.0)
MCHC: 31.6 g/dL (ref 30.0–36.0)
MCV: 61.4 fL — ABNORMAL LOW (ref 78.0–100.0)
Platelets: 150 10*3/uL (ref 150–400)
RBC: 5.26 MIL/uL (ref 4.22–5.81)
RDW: 14.9 % (ref 11.5–15.5)
WBC: 8.8 10*3/uL (ref 4.0–10.5)

## 2015-01-11 LAB — BASIC METABOLIC PANEL
Anion gap: 8 (ref 5–15)
BUN: 40 mg/dL — AB (ref 6–20)
CO2: 28 mmol/L (ref 22–32)
Calcium: 8.3 mg/dL — ABNORMAL LOW (ref 8.9–10.3)
Chloride: 104 mmol/L (ref 101–111)
Creatinine, Ser: 1.71 mg/dL — ABNORMAL HIGH (ref 0.61–1.24)
GFR, EST AFRICAN AMERICAN: 39 mL/min — AB (ref >60)
GFR, EST NON AFRICAN AMERICAN: 34 mL/min — AB (ref >60)
GLUCOSE: 122 mg/dL — AB (ref 65–99)
Potassium: 3.5 mmol/L (ref 3.5–5.1)
Sodium: 140 mmol/L (ref 135–145)

## 2015-01-11 LAB — MAGNESIUM: Magnesium: 2 mg/dL (ref 1.7–2.4)

## 2015-01-11 MED ORDER — MORPHINE SULFATE 2 MG/ML IJ SOLN
2.0000 mg | Freq: Once | INTRAMUSCULAR | Status: AC
  Administered 2015-01-11: 2 mg via INTRAVENOUS
  Filled 2015-01-11: qty 1

## 2015-01-11 NOTE — Progress Notes (Signed)
Patient only urinated 10cc this am- will pass on to day RN.

## 2015-01-11 NOTE — Progress Notes (Signed)
eLink Physician-Brief Progress Note Patient Name: [name] DOB: 08-Feb-1924 MRN: [id number]   Date of Service  01/11/2015  HPI/Events of Note  C/o ankle pain.  eICU Interventions  Will give morphine.     Intervention Category Major Interventions: Other:  Thorne Wirz 01/11/2015, 12:21 AM

## 2015-01-11 NOTE — Progress Notes (Signed)
PULMONARY / CRITICAL CARE MEDICINE   Name: [name] MRN: [id number] DOB: 08-09-1924    ADMISSION DATE:  01/08/2015 CONSULTATION DATE:  5/27  REFERRING MD :  wofford  CHIEF COMPLAINT:  Acute hypoxic respiratory failure   INITIAL PRESENTATION:  81 year old male w/ prior h/o diastolic HF (Wall) and GOLD III COPD (wert). Admitted on 5/27 w/ working dx of acute on chronic hypercarbic resp failure in setting decompensated diastolic HF w/ pulmonary edema +/-CAP.   STUDIES:  ECHO 5/27>>>EF 40-45%, Gr 1 DD , Akinesis apical /inferior  (Prev echo 25-30%)  SIGNIFICANT EVENTS: Extubated 5/28   Subjective :  Pt sitting up in chair  stable  VITAL SIGNS: Temp:  [98.2 F (36.8 C)-98.7 F (37.1 C)] 98.7 F (37.1 C) (05/30 0532) Pulse Rate:  [70-76] 70 (05/29 2221) Resp:  [20-26] 20 (05/30 0532) BP: (111-131)/(43-51) 118/46 mmHg (05/30 0901) SpO2:  [90 %-96 %] 96 % (05/30 1436) Weight:  [75.1 kg (165 lb 9.1 oz)] 75.1 kg (165 lb 9.1 oz) (05/30 0456) HEMODYNAMICS:   VENTILATOR SETTINGS:   INTAKE / OUTPUT:  Intake/Output Summary (Last 24 hours) at 01/11/15 1445 Last data filed at 01/11/15 1131  Gross per 24 hour  Intake    580 ml  Output    500 ml  Net     80 ml    PHYSICAL EXAMINATION: General:  Nad, sitting up in chair  Neuro:  Following commands , maew  HEENT:   Dry mucosa  Cardiovascular:  rrr Lungs:  Decreased BS in bases  Abdomen:  Soft, no OM + bowel sounds Musculoskeletal:  Intact w/ good tone  Skin:  1-2+ pitting edema in LEs  LABS:  CBC  Recent Labs Lab 01/09/15 0241 01/10/15 0241 01/11/15 0543  WBC 16.5* 11.4* 8.8  HGB 11.9* 10.6* 10.2*  HCT 38.4* 33.2* 32.3*  PLT 223 158 150   Coag's  Recent Labs Lab 01/08/15 0725  INR 1.30   BMET  Recent Labs Lab 01/09/15 0241 01/10/15 0241 01/11/15 0543  NA 141 141 140  K 4.3 4.1 3.5  CL 108 107 104  CO2 21* 26 28  BUN 34* 37* 40*  CREATININE 1.55* 1.37* 1.71*  GLUCOSE 188* 106* 122*    Electrolytes  Recent Labs Lab 01/08/15 1000 01/09/15 0241 01/10/15 0241 01/11/15 0543  CALCIUM  --  9.0 8.4* 8.3*  MG 1.9 1.9  --  2.0  PHOS 3.0 1.0*  --  3.3   Sepsis Markers  Recent Labs Lab 01/08/15 0739 01/08/15 1000 01/09/15 0241 01/10/15 0241  LATICACIDVEN 2.25* 2.5*  --   --   PROCALCITON  --  <0.10 0.25 0.15   ABG  Recent Labs Lab 01/08/15 0805 01/09/15 0500  PHART 7.048* 7.505*  PCO2ART 46.9* 24.3*  PO2ART 33.0* 139.0*   Liver Enzymes  Recent Labs Lab 01/08/15 0725  AST 45*  ALT 25  ALKPHOS 92  BILITOT 1.0  ALBUMIN 3.6   Cardiac Enzymes  Recent Labs Lab 01/08/15 1000 01/08/15 1520 01/08/15 2050  TROPONINI 0.04* 0.11* 0.23*   Glucose  Recent Labs Lab 01/09/15 1653 01/09/15 1948 01/09/15 2332 01/10/15 0339 01/10/15 0821 01/10/15 1233  GLUCAP 89 78 129* 95 118* 168*    Imaging Dg Chest Port 1 View  01/11/2015   CLINICAL DATA:  Follow-up pneumonia, productive cough  EXAM: PORTABLE CHEST - 1 VIEW  COMPARISON:  01/10/2015  FINDINGS: Cardiomediastinal silhouette is stable. No pulmonary edema. Persistent streaky bilateral basilar and infrahilar atelectasis or infiltrate.  There is slight improvement in aeration of lung bases.  IMPRESSION: No pulmonary edema. Persistent bilateral basilar and infrahilar streaky atelectasis or infiltrate. Slight improvement in aeration.   Electronically Signed   By: Lahoma Crocker M.D.   On: 01/11/2015 08:06     ASSESSMENT / PLAN:  PULMONARY OETT 5/27> 5/28  A: Acute on chronic hypercarbic Respiratory failure (PCO2 51 on admit, baseline calculated around 46), in setting of diffuse pulmonary edema +/- CAP and UTI.  H/o GOLD III COPD P:   Scheduled BDs Scheduled ICS Cont lasix  O2 for sat >90%   CARDIOVASCULAR CVL A:  Decompensated acute on chronic diastolic HF>echo 0/86 EF 76-19%, GR 1 DD (EF improved), akinesis apical/inferior  H/o HTN Pulmonary edema     troponin bump ?demand  P:  KVO IVFs   lasix 40mg  daily   Cont Coreg  Hydralazine for SBP >160 apprec cards help  RENAL A:   Mild acute on chronic renal failure (looks like baseline cr ~1.2-1.3) Minimal lactic acidosis  Hypokalemia >resolved  Hypophos >resolved P:   Trend chemistry  Cont  Lasix  Strict I&O Renal dose meds KVO IVFs  GASTROINTESTINAL A:   GERD on home PPI  P:   Advance diet  PPI for GERD   HEMATOLOGIC A:   Mild leukocytosis>wbc tr down  boarderline anemia P:  Trend cbc [place] heparin Transfuse per protocol   INFECTIOUS A:  R/o CAP but favor heart failure  UTI  WBC and Fever better  P:   BCx2 5/27>>>Neg UC 5/27>>>GNR 100k >>e coli Sputum 5/27>>>staph aur>>MSSA  levaquin 5/27>>> azactam 5/27>>>5/30       ENDOCRINE A:  Hyperglycemia   P:   D/c SSI  Follow BS on chem   NEUROLOGIC A:   Acute encephalopathy >resolved  Deaf left ear P:   Monitor     FAMILY  - Updates: daughter updated 5/30  - Inter-disciplinary family meet or Palliative Care meeting due by:  June 2    Asencion Noble MD [facility]  [phone]  Cell  [phone]  If no response or cell goes to voicemail, call beeper [phone]  Pulmonary and [place] Pager: [phone]  01/11/2015, 2:45 PM

## 2015-01-11 NOTE — Progress Notes (Signed)
MD notified- verbal orders to see labs in the am since patient has not taken in any intake. Patient is resting at this time.

## 2015-01-11 NOTE — Progress Notes (Signed)
RN was told that patient had foley d/c'ed at around 2:30pm on 5/29- patient stated that he did not need to urinate beginning of shift. RN handed the urinal to patient around  9pm- patient started to urinate just a little. When RN returned she noticed that patient had only urinated about 10cc. RN asked patient to try again- patient started to dripple and asked for him to try again later. RN returned around 11am to find patient trying to urinate again- only dripples. Bladder scan was completed with RN, Dudley Major and charge nurse Cordelia Pen.- residual 0. RN and tech stood patient up and patient urinated just a bit on his gown and the floor- in all patient urinated about 40cc. Patient stated that he had not drunk anything all day and did not feel any pressure to urinate. MD on call will be notified for further evaluation.

## 2015-01-11 NOTE — Progress Notes (Signed)
Primary cardiologist: Dr. Ena Dawley  Seen for followup: Acute on chronic CHF, CAD  Subjective:    Mainly complains of bilateral ankle pain - states he has this at home as well from time to time. No chest pain. Leg edema gone.  Objective:   Temp:  [98.2 F (36.8 C)-98.7 F (37.1 C)] 98.7 F (37.1 C) (05/30 0532) Pulse Rate:  [69-78] 70 (05/29 2221) Resp:  [17-26] 20 (05/30 0532) BP: (100-135)/(43-52) 121/51 mmHg (05/30 0532) SpO2:  [90 %-98 %] 95 % (05/30 0532) Weight:  [165 lb 9.1 oz (75.1 kg)] 165 lb 9.1 oz (75.1 kg) (05/30 0456) Last BM Date: 01/10/15  Filed Weights   01/09/15 0333 01/10/15 0340 01/11/15 0456  Weight: 172 lb 9.9 oz (78.3 kg) 162 lb 14.7 oz (73.9 kg) 165 lb 9.1 oz (75.1 kg)    Intake/Output Summary (Last 24 hours) at 01/11/15 0813 Last data filed at 01/11/15 0600  Gross per 24 hour  Intake    390 ml  Output   1850 ml  Net  -1460 ml    Telemetry: Sinus rhythm.  Exam:  General: No distress.  Lungs: No rales.  Cardiac: RRR, no S3.  Extremities: Hot packs on ankles, no leg edema or cords.   Lab Results:  Basic Metabolic Panel:  Recent Labs Lab 01/08/15 1000 01/09/15 0241 01/10/15 0241 01/11/15 0543  NA  --  141 141 140  K  --  4.3 4.1 3.5  CL  --  108 107 104  CO2  --  21* 26 28  GLUCOSE  --  188* 106* 122*  BUN  --  34* 37* 40*  CREATININE  --  1.55* 1.37* 1.71*  CALCIUM  --  9.0 8.4* 8.3*  MG 1.9 1.9  --  2.0    CBC:  Recent Labs Lab 01/09/15 0241 01/10/15 0241 01/11/15 0543  WBC 16.5* 11.4* 8.8  HGB 11.9* 10.6* 10.2*  HCT 38.4* 33.2* 32.3*  MCV 62.0* 61.8* 61.4*  PLT 223 158 150    Cardiac Enzymes:  Recent Labs Lab 01/08/15 1000 01/08/15 1520 01/08/15 2050  TROPONINI 0.04* 0.11* 0.23*    Echocardiogram 01/09/2015: Study Conclusions  - Left ventricle: The cavity size was normal. Wall thickness was increased in a pattern of mild LVH. Systolic function was mildly to moderately reduced. The  estimated ejection fraction was in the range of 40% to 45%. There is akinesis of the mid-apicalanteroseptal and apical myocardium. There is akinesis of the apicalinferior myocardium. Doppler parameters are consistent with abnormal left ventricular relaxation (grade 1 diastolic dysfunction). Doppler parameters are consistent with high ventricular filling pressure. - Aortic valve: Mildly calcified annulus. Trileaflet. - Mitral valve: Calcified annulus. Mildly calcified leaflets . - Left atrium: The atrium was mildly dilated. - Right atrium: Central venous pressure (est): 3 mm Hg. - Atrial septum: No defect or patent foramen ovale was identified. - Tricuspid valve: There was trivial regurgitation. - Pulmonary arteries: Systolic pressure could not be accurately estimated. - Pericardium, extracardiac: There was no pericardial effusion.  Impressions:  - Normal LV wall thickness with LVEF approximately 40-45%. Mid to apical anteroseptal and inferoapical akinesis suggests ischemic cardiomyopathy. Definity contrast shows swirling and slow flow at the apex, but no definitive mural thrombus. Grade 1 diastolic dysfunction with increased filling pressures. Mild left atrial enlargement. Trivial tricuspid regurgitation, unable to assess PASP.   Medications:   Scheduled Medications: . aspirin  81 mg Per Tube Daily  . aztreonam  2 g Intravenous 3  times per day  . budesonide (PULMICORT) nebulizer solution  0.5 mg Nebulization BID  . carvedilol  12.5 mg Per Tube BID WC  . furosemide  40 mg Intravenous Daily  . heparin  5,000 Units Subcutaneous 3 times per day  . ipratropium-albuterol  3 mL Nebulization Q6H  . levofloxacin (LEVAQUIN) IV  750 mg Intravenous Q48H  . losartan  25 mg Oral Daily  . pantoprazole  40 mg Oral Q1200  . pneumococcal 23 valent vaccine  0.5 mL Intramuscular Tomorrow-1000  . pregabalin  75 mg Oral Daily  . simvastatin  10 mg Per Tube q1800      PRN Medications:  sodium chloride, bisacodyl, hydrALAZINE, sennosides   Assessment:   1. Acute on chronic combined heart failure contributing to current presentation. LVEF 40-45% by follow-up echocardiogram with grade 1 diastolic dysfunction and increased filling pressures. Has had good diuresis and leg edema resolved.  2. Mild increase in troponin I, peak of 0.23. No active chest pain and ECG largely with chronic abnormalities, new nonspecific T wave changes. Most likely related to CHF rather than definitive ACS.  3. CAD, known occluded proximal LAD with collaterals and moderate RCA disease 2009.  4. Ischemic cardiomyopathy.  5. COPD, GOLD III.  6. Acure renal insufficiency. Creatinine up to 1.7.  Plan/Discussion:    Hold Lasix today, try and continue Cozaar. With decreased UOP after removing Foley question component of outflow obstruction - could check residuals and try in and out  As noted in consultation, will likely pursue follow-up Myoview to reassess ischemic burden eventually. This can be done as an outpatient most likely depending on clinical progress.   Satira Sark, M.D., F.A.C.C.

## 2015-01-11 NOTE — Progress Notes (Signed)
Patient states that both heels and ankles have been bothering him all day. Patient has been given heat packs to help but states that they are no longer working. RN paged MD on call- orders to give 2mg  of morphine. RN rechecked pain scale in an hour and patient has continued to yelled and scream- stating that the pain medication did not even touch the pain. RN placed pillow under bilateral feet and will reassessed pain level again in about 15- will page MD if more medication is needed.

## 2015-01-11 NOTE — Progress Notes (Signed)
ANTIBIOTIC CONSULT NOTE - FOLLOW UP  Pharmacy Consult for levaquin/aztreonam  Indication: Pneumonia  Allergies  Allergen Reactions  . Amoxicillin     REACTION: unspecified  . Clopidogrel Bisulfate Hives    REACTION: red blue, black blotches  . Dilaudid [Hydromorphone Hcl]   . Penicillins   . Sulfa Antibiotics   . Sulfamethoxazole     REACTION: unspecified  . Sulfonamide Derivatives     Patient Measurements: Height: 5\' 11"  (180.3 cm) Weight: 165 lb 9.1 oz (75.1 kg) IBW/kg (Calculated) : 75.3  Vital Signs: Temp: 98.7 F (37.1 C) (05/30 0532) Temp Source: Oral (05/30 0532) BP: 118/46 mmHg (05/30 0901) Intake/Output from previous day: 05/29 0701 - 05/30 0700 In: 690 [P.O.:390; IV Piggyback:300] Out: 2100 [Urine:2100] Intake/Output from this shift: Total I/O In: 240 [P.O.:240] Out: 450 [Urine:450]  Labs:  Recent Labs  01/09/15 0241 01/10/15 0241 01/11/15 0543  WBC 16.5* 11.4* 8.8  HGB 11.9* 10.6* 10.2*  PLT 223 158 150  CREATININE 1.55* 1.37* 1.71*   Estimated Creatinine Clearance: 31.1 mL/min (by C-G formula based on Cr of 1.71). No results for input(s): VANCOTROUGH, VANCOPEAK, VANCORANDOM, GENTTROUGH, GENTPEAK, GENTRANDOM, TOBRATROUGH, TOBRAPEAK, TOBRARND, AMIKACINPEAK, AMIKACINTROU, AMIKACIN in the last 72 hours.   Microbiology: Recent Results (from the past 720 hour(s))  Blood Culture (routine x 2)     Status: None (Preliminary result)   Collection Time: 01/08/15  7:25 AM  Result Value Ref Range Status   Specimen Description BLOOD RIGHT ANTECUBITAL  Final   Special Requests BOTTLES DRAWN AEROBIC AND ANAEROBIC  5ML  Final   Culture   Final           BLOOD CULTURE RECEIVED NO GROWTH TO DATE CULTURE WILL BE HELD FOR 5 DAYS BEFORE ISSUING A FINAL NEGATIVE REPORT Performed at [facility]    Report Status PENDING  Incomplete  Urine culture     Status: None   Collection Time: 01/08/15  7:54 AM  Result Value Ref Range Status   Specimen Description  URINE, CATHETERIZED  Final   Special Requests NONE  Final   Colony Count   Final    >=100,000 COLONIES/ML Performed at [facility]    Culture   Final    ESCHERICHIA COLI Performed at [facility]    Report Status 01/10/2015 FINAL  Final   Organism ID, Bacteria ESCHERICHIA COLI  Final      Susceptibility   Escherichia coli - MIC*    AMPICILLIN 8 SENSITIVE Sensitive     CEFAZOLIN <=4 SENSITIVE Sensitive     CEFTRIAXONE <=1 SENSITIVE Sensitive     CIPROFLOXACIN >=4 RESISTANT Resistant     GENTAMICIN <=1 SENSITIVE Sensitive     LEVOFLOXACIN >=8 RESISTANT Resistant     NITROFURANTOIN <=16 SENSITIVE Sensitive     TOBRAMYCIN <=1 SENSITIVE Sensitive     TRIMETH/SULFA >=320 RESISTANT Resistant     PIP/TAZO <=4 SENSITIVE Sensitive     * ESCHERICHIA COLI  Blood Culture (routine x 2)     Status: None (Preliminary result)   Collection Time: 01/08/15  8:00 AM  Result Value Ref Range Status   Specimen Description BLOOD RIGHT HAND  Final   Special Requests AEB 5 ML  Final   Culture   Final           BLOOD CULTURE RECEIVED NO GROWTH TO DATE CULTURE WILL BE HELD FOR 5 DAYS BEFORE ISSUING A FINAL NEGATIVE REPORT Performed at [facility]    Report Status PENDING  Incomplete  Culture, respiratory (NON-Expectorated)     Status: None   Collection Time: 01/08/15 10:30 AM  Result Value Ref Range Status   Specimen Description TRACHEAL ASPIRATE  Final   Special Requests NONE  Final   Gram Stain   Final    RARE WBC PRESENT,BOTH PMN AND MONONUCLEAR RARE SQUAMOUS EPITHELIAL CELLS PRESENT NO ORGANISMS SEEN Performed at [facility]    Culture   Final    FEW STAPHYLOCOCCUS AUREUS Note: RIFAMPIN AND GENTAMICIN SHOULD NOT BE USED AS SINGLE DRUGS FOR TREATMENT OF STAPH INFECTIONS. This organism DOES NOT demonstrate inducible Clindamycin resistance in vitro. Performed at [facility]    Report Status 01/11/2015 FINAL  Final   Organism ID, Bacteria  STAPHYLOCOCCUS AUREUS  Final      Susceptibility   Staphylococcus aureus - MIC*    CLINDAMYCIN <=0.25 SENSITIVE Sensitive     ERYTHROMYCIN >=8 RESISTANT Resistant     GENTAMICIN <=0.5 SENSITIVE Sensitive     LEVOFLOXACIN 4 INTERMEDIATE Intermediate     OXACILLIN 0.5 SENSITIVE Sensitive     PENICILLIN >=0.5 RESISTANT Resistant     RIFAMPIN <=0.5 SENSITIVE Sensitive     TRIMETH/SULFA <=10 SENSITIVE Sensitive     VANCOMYCIN <=0.5 SENSITIVE Sensitive     TETRACYCLINE <=1 SENSITIVE Sensitive     MOXIFLOXACIN 1 INTERMEDIATE Intermediate     * FEW STAPHYLOCOCCUS AUREUS  MRSA PCR Screening     Status: None   Collection Time: 01/08/15 10:49 AM  Result Value Ref Range Status   MRSA by PCR NEGATIVE NEGATIVE Final    Comment:        The GeneXpert MRSA Assay (FDA approved for NASAL specimens only), is one component of a comprehensive MRSA colonization surveillance program. It is not intended to diagnose MRSA infection nor to guide or monitor treatment for MRSA infections.     Anti-infectives    Start     Dose/Rate Route Frequency Ordered Stop   01/10/15 0800  levofloxacin (LEVAQUIN) IVPB 750 mg     750 mg 100 mL/hr over 90 Minutes Intravenous Every 48 hours 01/08/15 0808     01/10/15 0600  vancomycin (VANCOCIN) IVPB 1000 mg/200 mL premix  Status:  Discontinued     1,000 mg 200 mL/hr over 60 Minutes Intravenous Every 48 hours 01/08/15 0808 01/08/15 0919   01/08/15 1000  aztreonam (AZACTAM) injection 2 g  Status:  Discontinued     2 g Intravenous 3 times per day 01/08/15 0919 01/08/15 0944   01/08/15 1000  aztreonam (AZACTAM) 2 g in dextrose 5 % 50 mL IVPB     2 g 100 mL/hr over 30 Minutes Intravenous 3 times per day 01/08/15 0944     01/08/15 0900  vancomycin (VANCOCIN) 1,500 mg in sodium chloride 0.9 % 500 mL IVPB     1,500 mg 250 mL/hr over 120 Minutes Intravenous  Once 01/08/15 0808 01/08/15 1032   01/08/15 0730  levofloxacin (LEVAQUIN) IVPB 750 mg     750 mg 100 mL/hr over 90  Minutes Intravenous  Once 01/08/15 0729 01/08/15 0939   01/08/15 0730  vancomycin (VANCOCIN) IVPB 1000 mg/200 mL premix  Status:  Discontinued     1,000 mg 200 mL/hr over 60 Minutes Intravenous  Once 01/08/15 0729 01/08/15 0806      Assessment: 89 YOM on Levaquin/aztreonam for r/o CAP. With multiple antibiotic allergies. He is Afebrile. WBC trend down 8.8. LA 2.5. PCT 0.15. SCr down back up again 1.71, est CrCl 30 ml/min  5/27 Vanc x 1 5/27 Levaquin>> 5/27 Aztreonam>>  MRSA PCR neg 5/27 TA>> few staph aureus (S-clinda, Oxacillin, vanc, tetra, bactrim; I-levaquin and avelox) 5/27 Bld x2>> ngtd 5/27 Urine>> 100k E.coli (S- amp, ancef, rocephin, gent, nitro, zosyn; R-levaquin, cipro, bactrim)   Plan:  Levaquin 750mg  IV q48h Aztreonam 2gm IV q8h Consider switching levaquin to doxy? Monitor renal funciton  Maryanna Shape, PharmD, BCPS  Clinical Pharmacist  Pager: [phone]   01/11/2015,2:28 PM

## 2015-01-12 ENCOUNTER — Ambulatory Visit: Payer: Medicare Other | Admitting: Cardiology

## 2015-01-12 ENCOUNTER — Other Ambulatory Visit: Payer: Self-pay | Admitting: *Deleted

## 2015-01-12 DIAGNOSIS — J810 Acute pulmonary edema: Secondary | ICD-10-CM

## 2015-01-12 LAB — URIC ACID: URIC ACID, SERUM: 9.4 mg/dL — AB (ref 4.4–7.6)

## 2015-01-12 MED ORDER — BUDESONIDE-FORMOTEROL FUMARATE 160-4.5 MCG/ACT IN AERO
2.0000 | INHALATION_SPRAY | Freq: Two times a day (BID) | RESPIRATORY_TRACT | Status: DC
  Administered 2015-01-12 – 2015-01-14 (×4): 2 via RESPIRATORY_TRACT
  Filled 2015-01-12: qty 6

## 2015-01-12 MED ORDER — ACETAMINOPHEN 325 MG PO TABS
650.0000 mg | ORAL_TABLET | Freq: Four times a day (QID) | ORAL | Status: DC | PRN
  Administered 2015-01-12 – 2015-01-14 (×5): 650 mg via ORAL
  Filled 2015-01-12 (×5): qty 2

## 2015-01-12 MED ORDER — ENOXAPARIN SODIUM 30 MG/0.3ML ~~LOC~~ SOLN
30.0000 mg | Freq: Every day | SUBCUTANEOUS | Status: DC
  Administered 2015-01-12 – 2015-01-13 (×2): 30 mg via SUBCUTANEOUS
  Filled 2015-01-12 (×3): qty 0.3

## 2015-01-12 MED ORDER — ENOXAPARIN SODIUM 30 MG/0.3ML ~~LOC~~ SOLN
30.0000 mg | SUBCUTANEOUS | Status: DC
  Filled 2015-01-12: qty 0.3

## 2015-01-12 NOTE — Evaluation (Signed)
Physical Therapy Evaluation Patient Details Name: [name] MRN: [id number] DOB: 10-Jul-1924 Today's Date: 01/12/2015   History of Present Illness  Pt adm for respiratory failure due to pulmonary edema and decompensated heart failure. Pt intubated 5/27. Extubated 5/28.PMH - COPD,   Clinical Impression  Pt admitted with above diagnosis and presents to PT with functional limitations due to deficits listed below (See PT problem list). Pt needs skilled PT to maximize independence and safety to allow discharge to home with intermittent assist. If ankle pain improves expect pt's mobility will return to baseline.     Follow Up Recommendations Home health PT;Supervision - Intermittent    Equipment Recommendations  None recommended by PT    Recommendations for Other Services       Precautions / Restrictions Precautions Precautions: None      Mobility  Bed Mobility                  Transfers Overall transfer level: Needs assistance Equipment used: Rolling walker (2 wheeled) Transfers: Sit to/from Stand Sit to Stand: Min assist         General transfer comment: Assist for support  Ambulation/Gait Ambulation/Gait assistance: Min guard Ambulation Distance (Feet): 200 Feet Assistive device: Rolling walker (2 wheeled) Gait Pattern/deviations: Step-through pattern;Decreased step length - right;Decreased step length - left;Antalgic Gait velocity: decr due to pain Gait velocity interpretation: Below normal speed for age/gender General Gait Details: Limited by painful bil ankles.  Stairs            Wheelchair Mobility    Modified Rankin (Stroke Patients Only)       Balance Overall balance assessment: No apparent balance deficits (not formally assessed)                                           Pertinent Vitals/Pain Pain Assessment: Faces Faces Pain Scale: Hurts even more Pain Location: bilateral ankles Pain Descriptors / Indicators:  Grimacing Pain Intervention(s): Limited activity within patient's tolerance;Repositioned    Home Living Family/patient expects to be discharged to:: Private residence Living Arrangements: Spouse/significant other (Wife has dementia and pt is caregiver.) Available Help at Discharge: Available PRN/intermittently Type of Home: House Home Access: Level entry     Home Layout: One level Home Equipment: Environmental consultant - 4 wheels Additional Comments: Pt's wife is currently staying with her son and can stay there until pt can care for her.    Prior Function Level of Independence: Independent         Comments: Takes care of wife, drives.     Hand Dominance        Extremity/Trunk Assessment   Upper Extremity Assessment: Overall WFL for tasks assessed           Lower Extremity Assessment: Overall WFL for tasks assessed         Communication   Communication: HOH  Cognition Arousal/Alertness: Awake/alert Behavior During Therapy: WFL for tasks assessed/performed Overall Cognitive Status: Within Functional Limits for tasks assessed                      General Comments      Exercises        Assessment/Plan    PT Assessment Patient needs continued PT services  PT Diagnosis Difficulty walking;Acute pain   PT Problem List Decreased activity tolerance;Decreased mobility;Pain  PT Treatment Interventions DME instruction;Gait training;Functional  mobility training;Therapeutic activities;Patient/family education   PT Goals (Current goals can be found in the Care Plan section) Acute Rehab PT Goals Patient Stated Goal: return home PT Goal Formulation: With patient Time For Goal Achievement: 01/19/15 Potential to Achieve Goals: Good    Frequency Min 3X/week   Barriers to discharge        Co-evaluation               End of Session   Activity Tolerance: Patient limited by pain Patient left: in chair;with call bell/phone within reach;with chair alarm set Nurse  Communication: Mobility status         Time: 6568-1275 PT Time Calculation (min) (ACUTE ONLY): 21 min   Charges:   PT Evaluation $Initial PT Evaluation Tier I: 1 Procedure     PT G Codes:        Roslynn Holte 01/15/2015, 4:13 PM  [facility] PT [phone]

## 2015-01-12 NOTE — Progress Notes (Signed)
Nutrition Follow-up  DOCUMENTATION CODES:  Not applicable  INTERVENTION:  Other (Comment) (Continue with current nutrition plan of care (Heart Healthy diet))  NUTRITION DIAGNOSIS:  Inadequate oral intake related to inability to eat as evidenced by NPO status.  Resolved  GOAL:  Patient will meet greater than or equal to 90% of their needs  Met  MONITOR:  PO intake, Labs, Weight trends, Skin, I & O's  REASON FOR ASSESSMENT:  Consult Enteral/tube feeding initiation and management  ASSESSMENT: Patient with GOLD III COPD. Presented to the ED on 5/27 with 2 day history of cough, developed acute onset of dyspnea. Required intubation in the ED.   Pt extubated on 01/09/15- nutritional needs re-estimated due to change in status. TF d/c. He was transferred out of ICU to medical floor on 01/10/15.   Pt sleeping soundly at time of visit. He is currently on a Heart Healthy diet and tolerating meal well. Noted 100% meal completion.   Noted a 8# wt loss since 01/08/15 likely due to diuretic use. Per MD notes, lasix held on 01/11/15 due to absence of edema.   Height:  Ht Readings from Last 1 Encounters:  01/08/15 5' 11"  (1.803 m)    Weight:  Wt Readings from Last 1 Encounters:  01/12/15 164 lb 7.4 oz (74.6 kg)    Ideal Body Weight:  78.2 kg  Wt Readings from Last 10 Encounters:  01/12/15 164 lb 7.4 oz (74.6 kg)  10/13/14 168 lb (76.204 kg)  09/24/14 167 lb 1.6 oz (75.796 kg)  09/22/14 169 lb (76.658 kg)  08/18/14 176 lb (79.833 kg)  01/27/14 176 lb (79.833 kg)  01/23/14 179 lb 9.6 oz (81.466 kg)  12/05/13 176 lb (79.833 kg)  11/17/13 176 lb 12.8 oz (80.196 kg)  11/13/13 176 lb (79.833 kg)    BMI:  Body mass index is 22.95 kg/(m^2).  Estimated Nutritional Needs:  Kcal:  1650-1850  Protein:  80-90 grams  Fluid:  1.6-1.8 L  Skin:  Reviewed, no issues (closed incision on lt anterior arm)  Diet Order:  Diet Heart Room service appropriate?: Yes; Fluid consistency::  Thin  EDUCATION NEEDS:  No education needs identified at this time   Intake/Output Summary (Last 24 hours) at 01/12/15 1304 Last data filed at 01/12/15 1119  Gross per 24 hour  Intake    480 ml  Output    500 ml  Net    -20 ml    Last BM:  01/10/15  Jazzmyne Rasnick A. Jimmye Norman, RD, LDN, CDE Pager: [phone] After hours Pager: [phone]

## 2015-01-12 NOTE — Progress Notes (Addendum)
PULMONARY / CRITICAL CARE MEDICINE   Name: [name] MRN: [id number] DOB: 01-26-1924    ADMISSION DATE:  01/08/2015 CONSULTATION DATE:  5/27  REFERRING MD :  wofford  CHIEF COMPLAINT:  Acute hypoxic respiratory failure   INITIAL PRESENTATION:  81 year old male w/ prior h/o diastolic HF ([place]) and GOLD III COPD (wert). Admitted on 5/27 w/ working dx of acute on chronic hypercarbic resp failure in setting decompensated diastolic HF w/ pulmonary edema +/-CAP.   STUDIES:  ECHO 5/27>>>EF 40-45%, Gr 1 DD , Akinesis apical /inferior  (Prev echo 25-30%)  SIGNIFICANT EVENTS: Extubated 5/28   Subjective :  Pt sitting up in chair  stable  VITAL SIGNS: Temp:  [98 F (36.7 C)-98.2 F (36.8 C)] 98.2 F (36.8 C) (05/31 0542) Pulse Rate:  [74-77] 74 (05/31 0806) Resp:  [18-19] 18 (05/31 0542) BP: (99-129)/(40-63) 101/40 mmHg (05/31 1018) SpO2:  [96 %-100 %] 100 % (05/31 0806) Weight:  [164 lb 7.4 oz (74.6 kg)] 164 lb 7.4 oz (74.6 kg) (05/31 0433) HEMODYNAMICS:   VENTILATOR SETTINGS:   INTAKE / OUTPUT:  Intake/Output Summary (Last 24 hours) at 01/12/15 1107 Last data filed at 01/12/15 0931  Gross per 24 hour  Intake    480 ml  Output    250 ml  Net    230 ml    PHYSICAL EXAMINATION: General:  Nad, sitting up in chair , complains of bilateral ankle pain Neuro:  Following commands , maew  HEENT: WNL Cardiovascular: Reg, no M Lungs: bilateral crackles, no wheezes  Abdomen:  Soft, no OM + bowel sounds Ext: trace symmetric edema  LABS:  CBC  Recent Labs Lab 01/09/15 0241 01/10/15 0241 01/11/15 0543  WBC 16.5* 11.4* 8.8  HGB 11.9* 10.6* 10.2*  HCT 38.4* 33.2* 32.3*  PLT 223 158 150   Coag's  Recent Labs Lab 01/08/15 0725  INR 1.30   BMET  Recent Labs Lab 01/09/15 0241 01/10/15 0241 01/11/15 0543  NA 141 141 140  K 4.3 4.1 3.5  CL 108 107 104  CO2 21* 26 28  BUN 34* 37* 40*  CREATININE 1.55* 1.37* 1.71*  GLUCOSE 188* 106* 122*    Electrolytes  Recent Labs Lab 01/08/15 1000 01/09/15 0241 01/10/15 0241 01/11/15 0543  CALCIUM  --  9.0 8.4* 8.3*  MG 1.9 1.9  --  2.0  PHOS 3.0 1.0*  --  3.3   Sepsis Markers  Recent Labs Lab 01/08/15 0739 01/08/15 1000 01/09/15 0241 01/10/15 0241  LATICACIDVEN 2.25* 2.5*  --   --   PROCALCITON  --  <0.10 0.25 0.15   ABG  Recent Labs Lab 01/08/15 0805 01/09/15 0500  PHART 7.048* 7.505*  PCO2ART 46.9* 24.3*  PO2ART 33.0* 139.0*   Liver Enzymes  Recent Labs Lab 01/08/15 0725  AST 45*  ALT 25  ALKPHOS 92  BILITOT 1.0  ALBUMIN 3.6   Cardiac Enzymes  Recent Labs Lab 01/08/15 1000 01/08/15 1520 01/08/15 2050  TROPONINI 0.04* 0.11* 0.23*   Glucose  Recent Labs Lab 01/09/15 1653 01/09/15 1948 01/09/15 2332 01/10/15 0339 01/10/15 0821 01/10/15 1233  GLUCAP 89 78 129* 95 118* 168*    Imaging No results found.   ASSESSMENT / PLAN:  PULMONARY OETT 5/27> 5/28  A: Acute on chronic hypercarbic Respiratory failure (PCO2 51 on admit, baseline calculated around 46), in setting of diffuse pulmonary edema +/- CAP and UTI.  H/o GOLD III COPD P:   Scheduled BDs Scheduled ICS Cont lasix  O2  for sat >90%   CARDIOVASCULAR CVL A:  Decompensated acute on chronic diastolic HF>echo 7/03 EF 50-09%, GR 1 DD (EF improved), akinesis apical/inferior  H/o HTN Pulmonary edema     troponin bump ?demand  P:  KVO IVFs  lasix 40mg  daily   Cont Coreg  Hydralazine for SBP >160 apprec cards help  RENAL A:   Mild acute on chronic renal failure (looks like baseline cr ~1.2-1.3) Minimal lactic acidosis  Hypokalemia >resolved  Hypophos >resolved P:   Trend chemistry  Cont  Lasix  Strict I&O Renal dose meds KVO IVFs  GASTROINTESTINAL A:   GERD on home PPI  P:   Advance diet  PPI for GERD   HEMATOLOGIC A:   Mild leukocytosis>wbc tr down  boarderline anemia P:  Trend cbc [place] heparin Transfuse per protocol   INFECTIOUS A:  R/o CAP but  favor heart failure  UTI  WBC and Fever better  P:   BCx2 5/27>>>Neg UC 5/27>>>GNR 100k >>e coli Sputum 5/27>>>staph aur>>MSSA  levaquin 5/27>>> azactam 5/27>>>5/30       ENDOCRINE CBG (last 3)   Recent Labs  01/10/15 0339 01/10/15 0821 01/10/15 1233  GLUCAP 95 118* 168*     A:  Hyperglycemia   P:   D/c SSI  Follow BS on chem   NEUROLOGIC A:   Acute encephalopathy >resolved  Deaf left ear Lower ext pain P:   Monitor  Tylenol/Lyrica      Richardson Landry Minor ACNP Maryanna Shape PCCM Pager [phone] till 3 pm If no answer page [phone] 01/12/2015, 11:08 AM   PCCM ATTENDING: I have reviewed pt's initial presentation, consultants notes and hospital database in detail.  The above assessment and plan was formulated under my direction.  In summary: Hypoxic resp failure and pulmonary infiltrates resolving. Doubt PNA. Suspect this is all pulmonary edema. No distress on RA. Ankle and pedal edema are apparently improving. Exam continues to reveal bibasilar crackles. Will DC abx and recheck CXR in AM 6/01. He is likely ready for discharge 6/01   Merton Border, MD;  PCCM service; Mobile [phone]

## 2015-01-12 NOTE — Progress Notes (Signed)
NP made aware at bedside of patient's ankle pain. Patient stated this pain came on right before his admission. No PRN orders in for patient at this time.

## 2015-01-13 ENCOUNTER — Other Ambulatory Visit: Payer: Self-pay | Admitting: Physician Assistant

## 2015-01-13 ENCOUNTER — Inpatient Hospital Stay (HOSPITAL_COMMUNITY): Payer: Medicare Other

## 2015-01-13 DIAGNOSIS — I5023 Acute on chronic systolic (congestive) heart failure: Secondary | ICD-10-CM

## 2015-01-13 DIAGNOSIS — J810 Acute pulmonary edema: Secondary | ICD-10-CM | POA: Insufficient documentation

## 2015-01-13 DIAGNOSIS — I420 Dilated cardiomyopathy: Secondary | ICD-10-CM

## 2015-01-13 DIAGNOSIS — I5043 Acute on chronic combined systolic (congestive) and diastolic (congestive) heart failure: Secondary | ICD-10-CM

## 2015-01-13 LAB — BASIC METABOLIC PANEL
Anion gap: 9 (ref 5–15)
BUN: 34 mg/dL — ABNORMAL HIGH (ref 6–20)
CO2: 27 mmol/L (ref 22–32)
Calcium: 8.6 mg/dL — ABNORMAL LOW (ref 8.9–10.3)
Chloride: 103 mmol/L (ref 101–111)
Creatinine, Ser: 1.42 mg/dL — ABNORMAL HIGH (ref 0.61–1.24)
GFR calc Af Amer: 49 mL/min — ABNORMAL LOW (ref >60)
GFR, EST NON AFRICAN AMERICAN: 42 mL/min — AB (ref >60)
Glucose, Bld: 112 mg/dL — ABNORMAL HIGH (ref 65–99)
POTASSIUM: 3.6 mmol/L (ref 3.5–5.1)
Sodium: 139 mmol/L (ref 135–145)

## 2015-01-13 MED ORDER — COLCHICINE 0.6 MG PO TABS
0.6000 mg | ORAL_TABLET | Freq: Every day | ORAL | Status: DC
  Administered 2015-01-13 – 2015-01-14 (×2): 0.6 mg via ORAL
  Filled 2015-01-13 (×2): qty 1

## 2015-01-13 MED ORDER — ALPRAZOLAM 0.25 MG PO TABS
0.2500 mg | ORAL_TABLET | Freq: Once | ORAL | Status: AC
  Administered 2015-01-13: 0.25 mg via ORAL
  Filled 2015-01-13: qty 1

## 2015-01-13 MED ORDER — FUROSEMIDE 20 MG PO TABS
20.0000 mg | ORAL_TABLET | Freq: Every day | ORAL | Status: DC
Start: 2015-01-13 — End: 2015-01-13
  Filled 2015-01-13: qty 1

## 2015-01-13 MED ORDER — FUROSEMIDE 40 MG PO TABS
40.0000 mg | ORAL_TABLET | Freq: Every day | ORAL | Status: DC
  Administered 2015-01-13 – 2015-01-14 (×2): 40 mg via ORAL
  Filled 2015-01-13 (×2): qty 1

## 2015-01-13 NOTE — Progress Notes (Signed)
PULMONARY / CRITICAL CARE MEDICINE   Name: [name] MRN: [id number] DOB: August 08, 1924    ADMISSION DATE:  01/08/2015 CONSULTATION DATE:  5/27  REFERRING MD :  wofford  CHIEF COMPLAINT:  Acute hypoxic respiratory failure   INITIAL PRESENTATION:  81 year old male w/ prior h/o diastolic HF ([place]) and GOLD III COPD (wert). Admitted on 5/27 w/ working dx of acute on chronic hypercarbic resp failure in setting decompensated diastolic HF w/ pulmonary edema +/-CAP.   STUDIES:  ECHO 5/27>>>EF 40-45%, Gr 1 DD , Akinesis apical /inferior  (Prev echo 25-30%)  SIGNIFICANT EVENTS: Extubated 5/28   Subjective :  Feeling better.  Worried about caring for his wife with alzheimers.   VITAL SIGNS: Temp:  [97.5 F (36.4 C)-98.2 F (36.8 C)] 98.2 F (36.8 C) (06/01 0606) Pulse Rate:  [67-103] 71 (06/01 0826) Resp:  [13-16] 13 (06/01 0606) BP: (104-133)/(33-51) 122/48 mmHg (06/01 0826) SpO2:  [96 %-100 %] 98 % (06/01 0936) Weight:  [161 lb 6.4 oz (73.211 kg)] 161 lb 6.4 oz (73.211 kg) (06/01 0725) HEMODYNAMICS:   VENTILATOR SETTINGS:   INTAKE / OUTPUT:  Intake/Output Summary (Last 24 hours) at 01/13/15 1108 Last data filed at 01/13/15 0932  Gross per 24 hour  Intake    732 ml  Output   1000 ml  Net   -268 ml    PHYSICAL EXAMINATION: General:  Nad, sitting up in chair , complains of bilateral ankle pain Neuro:  Following commands , maew  HEENT: WNL Cardiovascular: Reg, no M Lungs: bilateral crackles, no wheezes  Abdomen:  Soft, no OM + bowel sounds Ext: trace symmetric edema  LABS:  CBC  Recent Labs Lab 01/09/15 0241 01/10/15 0241 01/11/15 0543  WBC 16.5* 11.4* 8.8  HGB 11.9* 10.6* 10.2*  HCT 38.4* 33.2* 32.3*  PLT 223 158 150   Coag's  Recent Labs Lab 01/08/15 0725  INR 1.30   BMET  Recent Labs Lab 01/10/15 0241 01/11/15 0543 01/13/15 0651  NA 141 140 139  K 4.1 3.5 3.6  CL 107 104 103  CO2 26 28 27   BUN 37* 40* 34*  CREATININE 1.37* 1.71* 1.42*   GLUCOSE 106* 122* 112*   Electrolytes  Recent Labs Lab 01/08/15 1000 01/09/15 0241 01/10/15 0241 01/11/15 0543 01/13/15 0651  CALCIUM  --  9.0 8.4* 8.3* 8.6*  MG 1.9 1.9  --  2.0  --   PHOS 3.0 1.0*  --  3.3  --    Sepsis Markers  Recent Labs Lab 01/08/15 0739 01/08/15 1000 01/09/15 0241 01/10/15 0241  LATICACIDVEN 2.25* 2.5*  --   --   PROCALCITON  --  <0.10 0.25 0.15   ABG  Recent Labs Lab 01/08/15 0805 01/09/15 0500  PHART 7.048* 7.505*  PCO2ART 46.9* 24.3*  PO2ART 33.0* 139.0*   Liver Enzymes  Recent Labs Lab 01/08/15 0725  AST 45*  ALT 25  ALKPHOS 92  BILITOT 1.0  ALBUMIN 3.6   Cardiac Enzymes  Recent Labs Lab 01/08/15 1000 01/08/15 1520 01/08/15 2050  TROPONINI 0.04* 0.11* 0.23*   Glucose  Recent Labs Lab 01/09/15 1653 01/09/15 1948 01/09/15 2332 01/10/15 0339 01/10/15 0821 01/10/15 1233  GLUCAP 89 78 129* 95 118* 168*    Imaging Dg Chest 2 View  01/13/2015   CLINICAL DATA:  CHF, cough, history essential hypertension, coronary artery disease post MI, ischemic cardiomyopathy, prostate cancer, stroke, COPD  EXAM: CHEST  2 VIEW  COMPARISON:  01/11/2015  FINDINGS: Normal heart size, mediastinal  contours and pulmonary vascularity.  Atherosclerotic calcification aorta.  Emphysematous changes with improvement and BILATERAL lower lobe infiltrates since previous exam.  Linear subsegmental atelectasis at LEFT base increased.  Remaining lungs clear.  No pleural effusion or pneumothorax.  Bones demineralized.  IMPRESSION: COPD changes with subsegmental atelectasis at LEFT base.  Improved BILATERAL lower lobe infiltrates.   Electronically Signed   By: Lavonia Dana M.D.   On: 01/13/2015 08:10     ASSESSMENT / PLAN:  PULMONARY OETT 5/27> 5/28  A: Acute on chronic hypercarbic Respiratory failure in setting of diffuse pulmonary edema +/- CAP and UTI.  H/o GOLD III COPD P:  Cont symbicort (home rx) Cont lasix  O2 for sat  >90%   CARDIOVASCULAR CVL A:  Decompensated acute on chronic diastolic HF H/o HTN Pulmonary edema   troponin bump - mild,  ?demand  P:  KVO IVFs Resume lasix 20mg  daily (home dose)  Cont Coreg  Hydralazine for SBP >160 apprec cards help - will need outpt f/u   RENAL A:   Mild acute on chronic renal failure (looks like baseline cr ~1.2-1.3) Minimal lactic acidosis  Hypokalemia >resolved  Hypophos >resolved P:   Trend chemistry  Cont lasix  Strict I&O Renal dose meds KVO IVFs  GASTROINTESTINAL A:   GERD on home PPI  P:   Advance diet  PPI for GERD   HEMATOLOGIC A:   Mild leukocytosis>wbc tr down  boarderline anemia P:  Trend cbc [place] heparin Transfuse per protocol   INFECTIOUS A:  R/o CAP but favor heart failure  UTI  WBC and Fever better  P:   BCx2 5/27>>>Neg UC 5/27>>>GNR 100k >>e coli Sputum 5/27>>>staph aur>>MSSA  levaquin 5/27>>> azactam 5/27>>>5/30  plan d/c abx 6/2 for total 7 days      ENDOCRINE DM A:  Hyperglycemia   P:   D/c SSI  Follow BS on chem    NEUROLOGIC A:   Acute encephalopathy >resolved  Deaf left ear Lower ext pain P:   Monitor  Tylenol/Lyrica   ORTHO:  Suspected gout  P:  Start colchicine  Monitor renal function    Ready for d/c 6/2.  Still working on home care. Will need HHPT.  Pt's daughter and case management working on home situation - He will likely move into his (recently deceased) brother's home to allow a full time caregiver to live with his wife while he recovers, requiring some assistance of his own.    Nickolas Madrid, NP 01/13/2015  11:08 AM Pager: [phone] or [phone]   PCCM ATTENDING: I have reviewed pt's initial presentation, consultants notes and hospital database in detail.  The above assessment and plan was formulated under my direction.  Major issue now is DC planning    Merton Border, MD;  PCCM service; Mobile [phone]

## 2015-01-13 NOTE — Progress Notes (Signed)
Patient Name: MELVEN STOCKARD Date of Encounter: 01/13/2015  Primary cardiologist: Dr. Ena Dawley   Active Problems:   Acute respiratory failure   Pneumonia   E-coli UTI    SUBJECTIVE  Denies any CP. SOB improved.   CURRENT MEDS . aspirin  81 mg Per Tube Daily  . budesonide-formoterol  2 puff Inhalation BID  . carvedilol  12.5 mg Per Tube BID WC  . colchicine  0.6 mg Oral Daily  . enoxaparin (LOVENOX) injection  30 mg Subcutaneous QHS  . furosemide  20 mg Oral Daily  . levofloxacin (LEVAQUIN) IV  750 mg Intravenous Q48H  . losartan  25 mg Oral Daily  . pantoprazole  40 mg Oral Q1200  . pneumococcal 23 valent vaccine  0.5 mL Intramuscular Tomorrow-1000  . pregabalin  75 mg Oral Daily  . simvastatin  10 mg Per Tube q1800    OBJECTIVE  Filed Vitals:   01/13/15 0606 01/13/15 0725 01/13/15 0826 01/13/15 0936  BP: 133/51  122/48   Pulse: 70  71   Temp: 98.2 F (36.8 C)     TempSrc: Oral     Resp: 13     Height:      Weight:  161 lb 6.4 oz (73.211 kg)    SpO2: 97%   98%    Intake/Output Summary (Last 24 hours) at 01/13/15 1108 Last data filed at 01/13/15 0932  Gross per 24 hour  Intake    732 ml  Output   1000 ml  Net   -268 ml   Filed Weights   01/11/15 0456 01/12/15 0433 01/13/15 0725  Weight: 165 lb 9.1 oz (75.1 kg) 164 lb 7.4 oz (74.6 kg) 161 lb 6.4 oz (73.211 kg)    PHYSICAL EXAM  General: Pleasant, NAD. Neuro: Alert and oriented X 3. Moves all extremities spontaneously. Psych: Normal affect. HEENT:  Normal  Neck: Supple without bruits or JVD. Lungs:  Resp regular and unlabored. L basilar rale, mildly decreased breath sound in R base.  Heart: RRR no s3, s4, or murmurs. Abdomen: Soft, non-tender, non-distended, BS + x 4.  Extremities: No clubbing, cyanosis. DP/PT/Radials 2+ and equal bilaterally. R ankle edema, otherwise no significant pretibial edema  Accessory Clinical Findings  CBC  Recent Labs  01/11/15 0543  WBC 8.8  HGB 10.2*  HCT  32.3*  MCV 61.4*  PLT 825   Basic Metabolic Panel  Recent Labs  01/11/15 0543 01/13/15 0651  NA 140 139  K 3.5 3.6  CL 104 103  CO2 28 27  GLUCOSE 122* 112*  BUN 40* 34*  CREATININE 1.71* 1.42*  CALCIUM 8.3* 8.6*  MG 2.0  --   PHOS 3.3  --     TELE NSR   ECG  No new EKG  Echocardiogram 01/09/2015  LV EF: 40% -  45%  ------------------------------------------------------------------- Indications:   Dyspnea 786.09.  ------------------------------------------------------------------- History:  PMH:  Coronary artery disease. Congestive heart failure. Chronic obstructive pulmonary disease. Risk factors: Hypertension.  ------------------------------------------------------------------- Study Conclusions  - Left ventricle: The cavity size was normal. Wall thickness was increased in a pattern of mild LVH. Systolic function was mildly to moderately reduced. The estimated ejection fraction was in the range of 40% to 45%. There is akinesis of the mid-apicalanteroseptal and apical myocardium. There is akinesis of the apicalinferior myocardium. Doppler parameters are consistent with abnormal left ventricular relaxation (grade 1 diastolic dysfunction). Doppler parameters are consistent with high ventricular filling pressure. - Aortic valve: Mildly calcified annulus. Trileaflet. -  Mitral valve: Calcified annulus. Mildly calcified leaflets . - Left atrium: The atrium was mildly dilated. - Right atrium: Central venous pressure (est): 3 mm Hg. - Atrial septum: No defect or patent foramen ovale was identified. - Tricuspid valve: There was trivial regurgitation. - Pulmonary arteries: Systolic pressure could not be accurately estimated. - Pericardium, extracardiac: There was no pericardial effusion.  Impressions:  - Normal LV wall thickness with LVEF approximately 40-45%. Mid to apical anteroseptal and inferoapical akinesis suggests  ischemic cardiomyopathy. Definity contrast shows swirling and slow flow at the apex, but no definitive mural thrombus. Grade 1 diastolic dysfunction with increased filling pressures. Mild left atrial enlargement. Trivial tricuspid regurgitation, unable to assess PASP.    Radiology/Studies  Dg Chest 2 View  01/13/2015   CLINICAL DATA:  CHF,   IMPRESSION: COPD changes with subsegmental atelectasis at LEFT base.  Improved BILATERAL lower lobe infiltrates.       ASSESSMENT AND PLAN  81 y.o.male with PMH of HTN, CAD, COPD, ICM, and chronic combined HF, now admitted with acute on chronic hypercarbic respiratory failure requiring temporary ventilatory support. Reports worsening LE edema for wks  1. Acute on chronic combined systolic and diastolic HF  - EF improved from 2010, Echo 12/18/2008 EF 25-30%  - 40-45% by follow-up echocardiogram with grade 1 diastolic dysfunction and increased filling pressures  - per pt, has been compliant with low Na diet and fluid restriction at home, although does not measure daily weight. Still came in with 1-2 week of progressive SOB.   - previously on 20mg  daily PO lasix, however will need higher dose, change to 40mg  daily PO lasix. Per pulm, likely discharge tomorrow, will need 1 wk BMET after discharge. I will arrange followup and BMET lab today.   2. Mildly elevated trop  3. CAD, known occluded proximal LAD with collaterals and moderate RCA disease 2009  4. ICM 5. COPD, GOLD III 6. Acute renal insufficiency  Signed, Almyra Deforest PA-C Pager: [phone]  81 year old male with known chronic systolic CHF, admitted with acute exacerbation, LVEF 40-45%, with some improvement on iv lasix with worsening Crea, now mildly fluid overloaded, we will give extra lasix 20 mg this pm and discharge on lasix 40 mg po daily. Denies chest pain.  Dorothy Spark 01/13/2015

## 2015-01-13 NOTE — Progress Notes (Signed)
MD paged regarding patient's penis being swollen.  This is new this afternoon per patient. Pt did have condom cath on and this has been removed due to the swelling. No pain reported or trouble urinating. MD to look at it in the morning when rounding. Will continue to monitor.

## 2015-01-13 NOTE — Progress Notes (Signed)
Physical Therapy Treatment Patient Details Name: [name] MRN: [id number] DOB: [birth year] Today's Date: 01/13/2015    History of Present Illness Pt adm for respiratory failure due to pulmonary edema and decompensated heart failure. Pt intubated 5/27. Extubated 5/28.PMH - COPD,     PT Comments    Pt was seen for evaluation of pt's current status, which is more unbalanced than last visit.  Will continue to focus on control of standing and gait to translate into safer home mobility, esp with care of his wife at stake.    Follow Up Recommendations  Home health PT;Supervision - Intermittent     Equipment Recommendations  None recommended by PT    Recommendations for Other Services       Precautions / Restrictions Precautions Precautions: None Restrictions Weight Bearing Restrictions: No    Mobility  Bed Mobility               General bed mobility comments: up when PT entered  Transfers Overall transfer level: Needs assistance Equipment used: Rolling walker (2 wheeled) Transfers: Sit to/from [facility] Sit to Stand: Min guard;Min assist Stand pivot transfers: Min guard;Min assist       General transfer comment: reminders for hand placemeent  Ambulation/Gait Ambulation/Gait assistance: Min guard Ambulation Distance (Feet): 200 Feet Assistive device: Rolling walker (2 wheeled) Gait Pattern/deviations: Decreased stride length;Wide base of support;Drifts right/left;Staggering left Gait velocity: reduced Gait velocity interpretation: Below normal speed for age/gender General Gait Details: Limited by endurance and comfort   Stairs            Wheelchair Mobility    Modified Rankin (Stroke Patients Only)       Balance Overall balance assessment: Needs assistance Sitting-balance support: Feet supported Sitting balance-Leahy Scale: Good   Postural control: Posterior lean;Left lateral lean Standing balance support: Bilateral upper  extremity supported Standing balance-Leahy Scale: Poor Standing balance comment: varies but at times is poor on walker                    Cognition Arousal/Alertness: Awake/alert Behavior During Therapy: WFL for tasks assessed/performed Overall Cognitive Status: Within Functional Limits for tasks assessed                      Exercises General Exercises - Lower Extremity Ankle Circles/Pumps: AROM;Both;5 reps Quad Sets: AROM;Both;10 reps Hip ABduction/ADduction: AROM;Both;10 reps    General Comments General comments (skin integrity, edema, etc.): Pt has been up but now is demonstrating some instability to L that may not have been present at previous visit.  Will ck that pt has help at home as he is caregiver for his wife.      Pertinent Vitals/Pain Pain Assessment: 0-10 Pain Score: 5  Pain Location: legs Pain Intervention(s): Limited activity within patient's tolerance;Monitored during session;Repositioned;Premedicated before session    Home Living                      Prior Function            PT Goals (current goals can now be found in the care plan section) Acute Rehab PT Goals Patient Stated Goal: home Progress towards PT goals: Progressing toward goals    Frequency  Min 3X/week    PT Plan Current plan remains appropriate    Co-evaluation             End of Session Equipment Utilized During Treatment: Gait belt Activity Tolerance: Patient limited by  fatigue;Patient limited by lethargy Patient left: in chair;with call bell/phone within reach;with chair alarm set     Time: 1330-1401 PT Time Calculation (min) (ACUTE ONLY): 31 min  Charges:  $Gait Training: 8-22 mins $Therapeutic Exercise: 8-22 mins                    G Codes:      Ramond Dial Jan 19, 2015, 2:45 PM   Mee Hives, PT MS Acute Rehab Dept. Number: [facility] [phone] and [place] [phone]

## 2015-01-13 NOTE — Progress Notes (Signed)
[place] Progress Note Patient Name: [name] DOB: [birth year] MRN: [id number]   Date of Service  01/13/2015  HPI/Events of Note  Patient requests sleeping aide.  eICU Interventions  Will order Xanax 0.25 mg PO X 1.     Intervention Category Minor Interventions: Routine modifications to care plan (e.g. PRN medications for pain, fever)  Sommer,Steven Eugene 01/13/2015, 1:25 AM

## 2015-01-14 DIAGNOSIS — I5043 Acute on chronic combined systolic (congestive) and diastolic (congestive) heart failure: Secondary | ICD-10-CM | POA: Insufficient documentation

## 2015-01-14 LAB — CULTURE, BLOOD (ROUTINE X 2)
CULTURE: NO GROWTH
CULTURE: NO GROWTH

## 2015-01-14 MED ORDER — CEPHALEXIN 500 MG PO CAPS: 500.0000 mg | ORAL_CAPSULE | Freq: Two times a day (BID) | ORAL | Status: DC

## 2015-01-14 MED ORDER — LOSARTAN POTASSIUM 25 MG PO TABS: 25.0000 mg | ORAL_TABLET | Freq: Every day | ORAL | Status: DC

## 2015-01-14 MED ORDER — FUROSEMIDE 20 MG PO TABS: 40.0000 mg | ORAL_TABLET | Freq: Every day | ORAL | Status: DC

## 2015-01-14 MED ORDER — CARVEDILOL 12.5 MG PO TABS: 12.5000 mg | ORAL_TABLET | Freq: Two times a day (BID) | ORAL | Status: DC

## 2015-01-14 NOTE — Discharge Summary (Signed)
Physician Discharge Summary  Patient ID: [name] MRN: [id number] DOB/AGE: [birth year] 79 y.o.  Admit date: 01/08/2015 Discharge date: 01/14/2015    Discharge Diagnoses:  Active Problems:   Acute respiratory failure   Pneumonia   E-coli UTI   Acute pulmonary edema    Brief Summary: [name] is a 81 y.o. y/o male with a PMH of diastolic heart failure, Gold III COPD ([place]), HTN, ischemic cardiomyopathy who presented 5/27 with 2 day hx cough, acute onset dyspnea which prompted EMS call. On arrival to ER had marked dyspnea, accessory muscle use despite solumedrol, BD, bipap.  He was ultimately intubated in ER and PCCM called to admit with acute on chronic hypercarbic respiratory failure in setting decompensated dCHF and ?CAP.  He was treated with mechanical ventilatory support, aggressive diuresis, empiric IV antibiotics, BD's. He was followed closely by cardiology as well.  He improved quickly and was extubated 5/28. Course was complicated by mild acute on chronic renal failure in setting lasix, probable gout flare and EColi UTI.  Lasix was held for several days r/t increased creatinine which is now back to baseline.  He was treated with empiric abx for ?CAP (doubt) and ?UTI.  He was seen in consultation by physical therapy who recommend home health PT which has been arranged.  At the time of discharge pt is drastically improved, near baseline and ambulating without difficulty on RA.    STUDIES:  ECHO 5/27>>>EF 40-45%, Gr 1 DD , Akinesis apical /inferior  (Prev echo 25-30%)  LINES/TUBES:  OETT 5/27> 5/28   MICRO/ ABX:  MRSA PCR neg 5/27 TA>> few staph aureus (S-clinda, Oxacillin, vanc, tetra, bactrim; I-levaquin and avelox) 5/27 Bld x2>> ngtd 5/27 Urine>> 100k E.coli (S- amp, ancef, rocephin, gent, nitro, zosyn; R-levaquin, cipro, bactrim)  levaquin 5/27>>>6/2 azactam 5/27>>>5/30 Keflex 6/2>>6/5                                                               Discharge Plan by  Discharge Diagnosis  Acute on chronic hypercarbic respiratory failure  Pulmonary edema  ?CAP - more likely heart failure but completed course abx given few staph in sputum culture as above  COPD  D/c plan --  Continue symbicort, PRN albuterol  outpt pulm f/u  Continue lasix 32m daily with outpt cards f/u as below  F/u CXR  PPI for GERD  S/p 7 day course abx for ?CAP  Decompensated acute on chronic diastolic HF H/o HTN Pulmonary edema  D/c plan --  Lasix 442mdaily  D/c on coreg, losartan as below per cardiology recs  outpt cardiology f/u  BMET in 1 week - arranged    Mild acute on chronic renal failure (looks like baseline cr ~1.2-1.3) Minimal lactic acidosis  Hypokalemia >resolved  Hypophos >resolved D/c plan --  PCP f/u  F/u chemistry 1 week  Continue lasix as above   ?UTI - asymptomatic.  EColi on culture.  Resistant to levaquin although pt improved s/p course levaquin D/c plan --  Will d/c on short course cephalexin given levaquin resistance on culture  PCP f/u   Suspected gout - much improved when lyrica resumed.  Not much improvement with colchicine.  D/c plan --  PCP management  Will hold colchicine at d/c given minimal improvement (although  only on it x 2 days) with borderline renal function and need for diuresis      Filed Vitals:   01/13/15 2140 01/14/15 0319 01/14/15 0537 01/14/15 0916  BP: 132/50  123/50   Pulse: 66  74   Temp: 97.7 F (36.5 C)  98.4 F (36.9 C)   TempSrc: Oral  Oral   Resp: 20  18   Height:      Weight:  161 lb 13.1 oz (73.4 kg)    SpO2: 99%  97% 98%     Discharge Labs  BMET  Recent Labs Lab 01/08/15 0725 01/08/15 0739 01/08/15 1000 01/09/15 0241 01/10/15 0241 01/11/15 0543 01/13/15 0651  NA 141 142  --  141 141 140 139  K 4.4 4.5  --  4.3 4.1 3.5 3.6  CL 106 108  --  108 107 104 103  CO2 27  --   --  21* _0 GLUCOSE 202* 207*  --  188* 106* 122* 112*  BUN 28* 38*  --  34* 37* 40* 34*  CREATININE  1.49* 1.40*  --  1.55* 1.37* 1.71* 1.42*  CALCIUM 8.9  --   --  9.0 8.4* 8.3* 8.6*  MG  --   --  1.9 1.9  --  2.0  --   PHOS  --   --  3.0 1.0*  --  3.3  --      CBC   Recent Labs Lab 01/09/15 0241 01/10/15 0241 01/11/15 0543  HGB 11.9* 10.6* 10.2*  HCT 38.4* 33.2* 32.3*  WBC 16.5* 11.4* 8.8  PLT 223 158 150   Anti-Coagulation  Recent Labs Lab 01/08/15 0725  INR 1.30             Follow-up Information    Follow up with Dorothy Spark, MD On 01/28/2015.   Specialty:  Cardiology   Why:  9:30am   Contact information:   [place] [id number] [phone]       Follow up with [facility] On 01/21/2015.   Specialty:  Cardiology   Why:  Obtain BMET lab anytime during lab hour between 8:30am to 4:30pm.   Contact information:   [address] [place]      Follow up with [facility].   Why:  HHPT   Contact information:   [place] [address] [phone]       Follow up with  Melinda Crutch, MD. Schedule an appointment as soon as possible for a visit in 2 weeks.   Specialty:  Family Medicine   Contact information:   [place] [address] [phone]       Follow up with Christinia Gully, MD On 01/29/2015.   Specialty:  Pulmonary Disease   Why:  9:00am    Contact information:   [address] [place] [place] [address] [phone]          Medication List    STOP taking these medications        naproxen 500 MG tablet  Commonly known as:  NAPROSYN     Vitamin D (Ergocalciferol) 50000 UNITS Caps capsule  Commonly known as:  DRISDOL      TAKE these medications        albuterol 108 (90 BASE) MCG/ACT inhaler  Commonly known as:  VENTOLIN HFA  Inhale 2 puffs into the lungs  every 4 (four) hours as needed for wheezing or shortness of breath.     aspirin 81 MG tablet  Take 81 mg by mouth  daily.     budesonide-formoterol 160-4.5 MCG/ACT inhaler  Commonly known as:  SYMBICORT  Take 2 puffs first thing in am and then another 2 puffs about 12 hours later.     carvedilol 12.5 MG tablet  Commonly known as:  COREG  Take 1 tablet (12.5 mg total) by mouth 2 (two) times daily with a meal.     cephALEXin 500 MG capsule  Commonly known as:  KEFLEX  Take 1 capsule (500 mg total) by mouth 2 (two) times daily.     furosemide 20 MG tablet  Commonly known as:  LASIX  Take 2 tablets (40 mg total) by mouth daily.     gemfibrozil 600 MG tablet  Commonly known as:  LOPID  Take 600 mg by mouth 2 (two) times daily before a meal.     losartan 25 MG tablet  Commonly known as:  COZAAR  Take 1 tablet (25 mg total) by mouth daily.     meclizine 25 MG tablet  Commonly known as:  ANTIVERT  Take as directed when needed for dizziness     mirabegron ER 50 MG Tb24 tablet  Commonly known as:  MYRBETRIQ  Take 50 mg by mouth at bedtime.     MIRALAX powder  Generic drug:  polyethylene glycol powder  Take 17 g by mouth daily as needed. For constipation     nitroGLYCERIN 0.4 MG SL tablet  Commonly known as:  NITROSTAT  Place 1 tablet (0.4 mg total) under the tongue every 5 (five) minutes as needed. For chest pain     omeprazole 20 MG capsule  Commonly known as:  PRILOSEC  Take 20 mg by mouth 2 (two) times daily before a meal.     pregabalin 75 MG capsule  Commonly known as:  LYRICA  Take 75 mg by mouth daily.     simvastatin 10 MG tablet  Commonly known as:  ZOCOR  Take 1 tablet (10 mg total) by mouth daily.     TYLENOL 325 MG tablet  Generic drug:  acetaminophen  Take 650 mg by mouth every 6 (six) hours as needed for mild pain.          Disposition: 01-Home or Self Care  Discharged Condition: [name] has met maximum benefit of inpatient care and is medically stable and cleared for discharge.  Patient is pending follow up as above.      Time spent on disposition:   Greater than 35 minutes.   SignedDarlina Sicilian, NP 01/14/2015  10:59 AM Pager: [phone] or [phone]  *Care during the described time interval was provided by me and/or other providers on the critical care team. I have reviewed this patient's available data, including medical history, events of note, physical examination and test results as part of my evaluation.   Merton Border, MD ; [facility] [phone].  After 5:30 PM or weekends, call [phone]

## 2015-01-14 NOTE — Progress Notes (Signed)
.   Patient Name: [name] Date of Encounter: 01/14/2015  Active Problems:   Acute respiratory failure   Pneumonia   E-coli UTI   Acute pulmonary edema  SUBJECTIVE  Denies chest pain, SOB or palpitation.   CURRENT MEDS . aspirin  81 mg Per Tube Daily  . budesonide-formoterol  2 puff Inhalation BID  . carvedilol  12.5 mg Per Tube BID WC  . colchicine  0.6 mg Oral Daily  . enoxaparin (LOVENOX) injection  30 mg Subcutaneous QHS  . furosemide  40 mg Oral Daily  . levofloxacin (LEVAQUIN) IV  750 mg Intravenous Q48H  . losartan  25 mg Oral Daily  . pantoprazole  40 mg Oral Q1200  . pneumococcal 23 valent vaccine  0.5 mL Intramuscular Tomorrow-1000  . pregabalin  75 mg Oral Daily  . simvastatin  10 mg Per Tube q1800    OBJECTIVE  Filed Vitals:   01/13/15 2140 01/14/15 0319 01/14/15 0537 01/14/15 0916  BP: 132/50  123/50   Pulse: 66  74   Temp: 97.7 F (36.5 C)  98.4 F (36.9 C)   TempSrc: Oral  Oral   Resp: 20  18   Height:      Weight:  161 lb 13.1 oz (73.4 kg)    SpO2: 99%  97% 98%    Intake/Output Summary (Last 24 hours) at 01/14/15 1054 Last data filed at 01/14/15 0923  Gross per 24 hour  Intake    960 ml  Output   1525 ml  Net   -565 ml   Filed Weights   01/12/15 0433 01/13/15 0725 01/14/15 0319  Weight: 164 lb 7.4 oz (74.6 kg) 161 lb 6.4 oz (73.211 kg) 161 lb 13.1 oz (73.4 kg)    PHYSICAL EXAM  General: Pleasant, NAD. Neuro: Alert and oriented X 3. Moves all extremities spontaneously. Psych: Normal affect. HEENT:  Normal  Neck: Supple without bruits or JVD. Lungs:  Resp regular and unlabored. Decreased breath sound on R base and L basilar rale.  Heart: RRR no s3, s4, or murmurs. Abdomen: Soft, non-tender, non-distended, BS + x 4.  Extremities: No clubbing, cyanosis or edema. DP/PT/Radials 2+ and equal bilaterally. Trace R ankle edema.   Accessory Clinical Findings  CBC No results for input(s): WBC, NEUTROABS, HGB, HCT, MCV, PLT in the last 72  hours. Basic Metabolic Panel  Recent Labs  01/13/15 0651  NA 139  K 3.6  CL 103  CO2 27  GLUCOSE 112*  BUN 34*  CREATININE 1.42*  CALCIUM 8.6*     TELE  NSR  Echocardiogram 01/09/2015 LV EF: 40% -  45%  ------------------------------------------------------------------- Indications:   Dyspnea 786.09.  ------------------------------------------------------------------- History:  PMH:  Coronary artery disease. Congestive heart failure. Chronic obstructive pulmonary disease. Risk factors: Hypertension.  ------------------------------------------------------------------- Study Conclusions  - Left ventricle: The cavity size was normal. Wall thickness was increased in a pattern of mild LVH. Systolic function was mildly to moderately reduced. The estimated ejection fraction was in the range of 40% to 45%. There is akinesis of the mid-apicalanteroseptal and apical myocardium. There is akinesis of the apicalinferior myocardium. Doppler parameters are consistent with abnormal left ventricular relaxation (grade 1 diastolic dysfunction). Doppler parameters are consistent with high ventricular filling pressure. - Aortic valve: Mildly calcified annulus. Trileaflet. - Mitral valve: Calcified annulus. Mildly calcified leaflets . - Left atrium: The atrium was mildly dilated. - Right atrium: Central venous pressure (est): 3 mm Hg. - Atrial septum: No defect or patent foramen  ovale was identified. - Tricuspid valve: There was trivial regurgitation. - Pulmonary arteries: Systolic pressure could not be accurately estimated. - Pericardium, extracardiac: There was no pericardial effusion.  Impressions:  - Normal LV wall thickness with LVEF approximately 40-45%. Mid to apical anteroseptal and inferoapical akinesis suggests ischemic cardiomyopathy. Definity contrast shows swirling and slow flow at the apex, but no definitive mural thrombus. Grade 1  diastolic dysfunction with increased filling pressures. Mild left atrial enlargement. Trivial tricuspid regurgitation, unable to assess PASP.  Radiology/Studies  Dg Chest 2 View  01/13/2015   CLINICAL DATA:  CHF, cough, history essential hypertension, coronary artery disease post MI, ischemic cardiomyopathy, prostate cancer, stroke, COPD  EXAM: CHEST  2 VIEW  COMPARISON:  01/11/2015  FINDINGS: Normal heart size, mediastinal contours and pulmonary vascularity.  Atherosclerotic calcification aorta.  Emphysematous changes with improvement and BILATERAL lower lobe infiltrates since previous exam.  Linear subsegmental atelectasis at LEFT base increased.  Remaining lungs clear.  No pleural effusion or pneumothorax.  Bones demineralized.  IMPRESSION: COPD changes with subsegmental atelectasis at LEFT base.  Improved BILATERAL lower lobe infiltrates.   Electronically Signed   By: Lavonia Dana M.D.   On: 01/13/2015 08:10   Dg Chest Port 1 View  01/11/2015   CLINICAL DATA:  Follow-up pneumonia, productive cough  EXAM: PORTABLE CHEST - 1 VIEW  COMPARISON:  01/10/2015  FINDINGS: Cardiomediastinal silhouette is stable. No pulmonary edema. Persistent streaky bilateral basilar and infrahilar atelectasis or infiltrate. There is slight improvement in aeration of lung bases.  IMPRESSION: No pulmonary edema. Persistent bilateral basilar and infrahilar streaky atelectasis or infiltrate. Slight improvement in aeration.   Electronically Signed   By: Lahoma Crocker M.D.   On: 01/11/2015 08:06   Dg Chest Port 1 View  01/10/2015   CLINICAL DATA:  Followup pneumonia.  Subsequent encounter.  EXAM: PORTABLE CHEST - 1 VIEW  COMPARISON:  01/09/2015  FINDINGS: Perihilar and lung base interstitial and hazy airspace opacities are without significant change from the previous day's study. No new lung abnormalities. No pneumothorax.  IMPRESSION: 1. No change from the previous day's study. 2. Persistent perihilar and lower lung zone  interstitial and hazy airspace opacities, which may reflect pneumonia, edema or ARDS or a combination.   Electronically Signed   By: Lajean Manes M.D.   On: 01/10/2015 07:28   Dg Chest Port 1 View  01/09/2015   CLINICAL DATA:  Acute respiratory failure  EXAM: PORTABLE CHEST - 1 VIEW  COMPARISON:  01/08/2015  FINDINGS: Endotracheal and nasogastric tubes are appropriately positioned. Hazy perihilar opacities have improved since previously. Trace if any pleural fluid. Left costophrenic angle omitted from the field of view. Clips project over the neck. Glenohumeral degenerative change noted.  IMPRESSION: Improved perihilar aeration suggesting improving ARDS or pulmonary edema.   Electronically Signed   By: Conchita Paris M.D.   On: 01/09/2015 07:23   Dg Chest Port 1 View  01/08/2015   CLINICAL DATA:  Hypoxia  EXAM: PORTABLE CHEST - 1 VIEW  COMPARISON:  September 24, 2014  FINDINGS: Endotracheal tube tip is 6.6 cm above the carina. Nasogastric tube tip and side port are below the diaphragm with the side port seen in the stomach. No pneumothorax. There is generalized interstitial edema, superimposed on emphysematous change. There is mild scarring in the bases. There is patchy alveolar edema, primarily in the mid lung regions.  Heart size is normal. The pulmonary vascularity reflects underlying emphysema. No adenopathy.  IMPRESSION: Tube positions as described without  pneumothorax. Findings felt to represent congestive heart failure superimposed on emphysematous change. Superimposed pneumonia cannot be excluded radiographically.   Electronically Signed   By: Lowella Grip III M.D.   On: 01/08/2015 07:54    ASSESSMENT AND PLAN    81 y.o.male with PMH of HTN, CAD, COPD, ICM, and chronic combined HF, now admitted with acute on chronic hypercarbic respiratory failure requiring temporary ventilatory support. Reports worsening LE edema for wks  1. Acute on chronic combined systolic and diastolic HF - Echo  02/16/1949 EF 25-30%, EF now improved - Echo 01/09/15 LV EF of 40-45% with grade 1 diastolic dysfunction and increased filling pressures - Per pt, has been compliant with low Na diet and fluid restriction at home, although does not measure daily weight. Still came in with 1-2 week of progressive SOB. Net I/O negative 4.4 L since admission. 4Lb weight loss (from 165lb to 161lb). This could be his dry weight.  -Continue lasix 40mg . Will need BMET in 1 week.  Arrangement has been made along with f/u visit with Dr. Meda Coffee.   2. Mildly elevated trop - Likely demand - Pt is asymptomatic  3. CAD, known occluded proximal LAD with collaterals and moderate RCA disease 2009 - Continue ASA, statin, BB and ARB  4. ICM 5. COPD, GOLD III  6. Acute renal insufficiency   Dispo *Weigh yourself on the same scale at same time of day and keep a log. *Report weight gain of > 2 lbs in 1 day or 5 lbs over the course of a week and/or symptoms of excess fluid (shortness of breath, difficulty lying flat, swelling, poor appetite, abdominal fullness/bloating, etc) to your doctor immediately. *Avoid foods that are high in sodium (processed, pre-packaged/canned goods, fast foods, etc). *Please attend all scheduled and reccommended follow up appointments   Signed, Ezekiah Massie PA-C Pager [phone]

## 2015-01-14 NOTE — Progress Notes (Signed)
Pt. Received discharge instructions with family at bedside. Educated pt. On follow-up appointments. No skin issues noted. All questions answered. No further needs noted at this time.

## 2015-01-14 NOTE — Care Management Note (Signed)
Case Management Note  Patient Details  Name: [name] MRN: [id number] Date of Birth: June 05, 1924  Subjective/Objective:  Patient for dc today, [name] with [facility] notified.                    Action/Plan:   Expected Discharge Date:   (unknown)               Expected Discharge Plan:  [place]  In-House Referral:     Discharge planning Services  CM Consult  Post Acute Care Choice:  Home Health Choice offered to:  Patient  DME Arranged:    DME Agency:     HH Arranged:  PT [place]:  [place]  Status of Service:  Completed, signed off  Medicare Important Message Given:  Yes Date Medicare IM Given:  01/11/15 Medicare IM give by:  debbie swist rn bsn cm Date Additional Medicare IM Given:  01/14/15 Additional Medicare Important Message give by:  Tomi Bamberger RN  If discussed at Long Length of Stay Meetings, dates discussed:    Additional Comments:  Zenon Mayo, RN 01/14/2015, 3:39 PM

## 2015-01-17 ENCOUNTER — Other Ambulatory Visit: Payer: Self-pay | Admitting: Cardiology

## 2015-01-19 ENCOUNTER — Telehealth: Payer: Self-pay | Admitting: Pulmonary Disease

## 2015-01-19 NOTE — Telephone Encounter (Signed)
Joey called returned call. Joey is requesting a verbal order for PT and OT to come out to pt's home twice per week for 9 weeks to help with gait, balance, and to help ensure a safe environment.   MW please advise. Thanks.

## 2015-01-19 NOTE — Telephone Encounter (Signed)
lmtcb foe Joey.

## 2015-01-19 NOTE — Telephone Encounter (Signed)
Called and spoke to [place]. Informed him of the OK per MW. Heron Sabins verbalized understanding and denied any further questions or concerns at this time.

## 2015-01-19 NOTE — Telephone Encounter (Signed)
Yes, should have been arranged at discharge - see note 01/14/15 from case manager

## 2015-01-21 ENCOUNTER — Other Ambulatory Visit (INDEPENDENT_AMBULATORY_CARE_PROVIDER_SITE_OTHER): Payer: Medicare Other | Admitting: *Deleted

## 2015-01-21 DIAGNOSIS — I5043 Acute on chronic combined systolic (congestive) and diastolic (congestive) heart failure: Secondary | ICD-10-CM | POA: Diagnosis not present

## 2015-01-21 LAB — BASIC METABOLIC PANEL
BUN: 36 mg/dL — ABNORMAL HIGH (ref 6–23)
CALCIUM: 9.6 mg/dL (ref 8.4–10.5)
CO2: 33 mEq/L — ABNORMAL HIGH (ref 19–32)
Chloride: 102 mEq/L (ref 96–112)
Creatinine, Ser: 1.36 mg/dL (ref 0.40–1.50)
GFR: 52.43 mL/min — ABNORMAL LOW (ref >60.00)
Glucose, Bld: 81 mg/dL (ref 70–99)
Potassium: 5.3 mEq/L — ABNORMAL HIGH (ref 3.5–5.1)
Sodium: 138 mEq/L (ref 135–145)

## 2015-01-28 ENCOUNTER — Encounter: Payer: Medicare Other | Admitting: Cardiology

## 2015-01-29 ENCOUNTER — Ambulatory Visit (INDEPENDENT_AMBULATORY_CARE_PROVIDER_SITE_OTHER): Payer: Medicare Other | Admitting: Internal Medicine

## 2015-01-29 ENCOUNTER — Ambulatory Visit (INDEPENDENT_AMBULATORY_CARE_PROVIDER_SITE_OTHER)
Admission: RE | Admit: 2015-01-29 | Discharge: 2015-01-29 | Disposition: A | Discharge status: Home / Self Care | Payer: Medicare Other | Source: Ambulatory Visit | Attending: Internal Medicine | Admitting: Internal Medicine

## 2015-01-29 ENCOUNTER — Ambulatory Visit (INDEPENDENT_AMBULATORY_CARE_PROVIDER_SITE_OTHER): Payer: Medicare Other | Admitting: Cardiology

## 2015-01-29 ENCOUNTER — Encounter: Payer: Self-pay | Admitting: Internal Medicine

## 2015-01-29 ENCOUNTER — Encounter: Payer: Self-pay | Admitting: Cardiology

## 2015-01-29 ENCOUNTER — Inpatient Hospital Stay: Payer: Medicare Other | Admitting: Internal Medicine

## 2015-01-29 VITALS — BP 116/42 | HR 61 | Ht 71.0 in | Wt 165.0 lb

## 2015-01-29 VITALS — BP 90/60 | HR 65 | Ht 72.0 in | Wt 166.2 lb

## 2015-01-29 DIAGNOSIS — I25810 Atherosclerosis of coronary artery bypass graft(s) without angina pectoris: Secondary | ICD-10-CM | POA: Diagnosis not present

## 2015-01-29 DIAGNOSIS — J189 Pneumonia, unspecified organism: Secondary | ICD-10-CM

## 2015-01-29 DIAGNOSIS — I10 Essential (primary) hypertension: Secondary | ICD-10-CM | POA: Diagnosis not present

## 2015-01-29 DIAGNOSIS — I255 Ischemic cardiomyopathy: Secondary | ICD-10-CM

## 2015-01-29 DIAGNOSIS — E785 Hyperlipidemia, unspecified: Secondary | ICD-10-CM

## 2015-01-29 DIAGNOSIS — I214 Non-ST elevation (NSTEMI) myocardial infarction: Secondary | ICD-10-CM

## 2015-01-29 DIAGNOSIS — J449 Chronic obstructive pulmonary disease, unspecified: Secondary | ICD-10-CM

## 2015-01-29 MED ORDER — MECLIZINE HCL 25 MG PO TABS: 25.0000 mg | ORAL_TABLET | Freq: Every day | ORAL | Status: DC | PRN

## 2015-01-29 MED ORDER — OMEPRAZOLE 20 MG PO CPDR: 20.0000 mg | DELAYED_RELEASE_CAPSULE | Freq: Two times a day (BID) | ORAL | Status: DC

## 2015-01-29 NOTE — Patient Instructions (Signed)
Medication Instructions:  None  Labwork: None  Testing/Procedures: Your physician has requested that you have a lexiscan myoview in early July. For further information please visit [email or web address]. Please follow instruction sheet, as given.   Follow-Up: Your physician recommends that you schedule a follow-up appointment in: 3 months.    Any Other Special Instructions Will Be Listed Below (If Applicable).

## 2015-01-29 NOTE — Patient Instructions (Signed)
Continue symbicort 160 Take 2 puffs first thing in am and then another 2 puffs about 12 hours later.  Rinse and gargle after use  Work on inhaler technique:  relax and gently blow all the way out then take a nice smooth deep breath back in, triggering the inhaler at same time you start breathing in.  Hold for up to 5 seconds if you can.  Rinse and gargle with water when done     Only use your albuterol as a rescue medication to be used if you can't catch your breath by resting or doing a relaxed purse lip breathing pattern.  - The less you use it, the better it will work when you need it. - Ok to use up to 2 puffs  every 4 hours if you must but call for immediate appointment if use goes up over your usual need - Don't leave home without it !!  (think of it like the spare tire for your car)    If you are satisfied with your treatment plan let your doctor know and he/she can either refill your medications or you can return here when your prescription runs out.     If in any way you are not 100% satisfied,  please tell us.  If 100% better, tell your friends!    Pulmonary follow up is as needed

## 2015-01-29 NOTE — Progress Notes (Signed)
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 Pulmonary Consult    Referred per Dr. Kathyrn Lass. Pt c/o cough and SOB for the past 10 days. He states cough is prod with large amounts of yellow sputum. He gets SOB walking to the mailbox and back. He uses ventolin for rescue approx 4 times per day.   cough bad esp in am rx pred and abx no better so far On symbicort 160 2bid and still feels need for saba qid rec Add spiriva 2 puffs each am Continue symbicort Take 2 puffs first thing in am and then another 2 puffs about 12 hours later.  Prednisone 10 mg take  4 each am x 2 days,   2 each am x 2 days,  1 each am x 2 days and stop  Levaquin x 7 days 500 mg one daily  For cough mucinex dm 1200 mg every 12 hours and try prilosec 20mg   Take 30-60 min before first meal of the day and Pepcid 20 mg one bedtime  GERD diet .  microcytic anemia detected  > pt reports has thalassemia and Dr Harrington Challenger aware   11/13/2013 f/u ov/[name] re: GOLD III COPD  Chief Complaint  Patient presents with  . Follow-up    SOB and cough are improving. No new co's today. Using rescue inhaler approx 3 times per day.     Sill using fish oil Cough better still some nasty mucus but much less and does not disturb sleep Doe x mailbox is 200 yards slt uphill back and sometimes stop half way  >>d/c fish oil   12/05/13 Follow up and Med Calendar  Returns for follow up and med review  We reviewed all her meds and organized them into a med calendar with pt education .   Denies any breathing  complaints at this time.   Feels cough is better.  Remains on symbicort and spiriva .  rec Follow med calendar and bring to each visit.  Continue on current regimen   01/23/2014 f/u ov/[name] re: just on symbicort and not rinsing/ no med calendar/ no saba need  Chief Complaint  Patient presents with  . Follow-up    Pt states that his cough has resolved, but c/o hoarseness for the past month.  He states that his breathing is back to his normal baseline.   some bruising on asa. Not limited by breathing from desired activities  rec Continue symbicort 160 Take 2 puffs first thing in am and then another 2 puffs about 12 hours later.  Rinse and gargle after use Only use your albuterol as a rescue medication    Admit date: 01/08/2015 Discharge date: 01/14/2015  Discharge Diagnoses:    Acute respiratory failure  Pneumonia  E-coli UTI  Acute pulmonary edema  Brief Summary:   presented 5/27 with 2 day hx cough, acute onset dyspnea which prompted EMS call. On arrival to ER had marked dyspnea, accessory muscle use despite solumedrol, BD, bipap. He was ultimately  intubated in ER and PCCM called to admit with acute on chronic hypercarbic respiratory failure in setting decompensated dCHF and ?CAP. He was treated with mechanical ventilatory support, aggressive diuresis, empiric IV antibiotics, BD's. He was followed closely by cardiology as well. He improved quickly and was extubated 5/28. Course was complicated by mild acute on chronic renal failure in setting lasix, probable gout flare and EColi UTI. Lasix was held for several days r/t increased creatinine which is now back to baseline. He was treated with empiric abx for ?CAP (doubt) and ?UTI. He was seen in consultation by physical therapy who recommend home health PT which has been arranged. At the time of discharge pt is drastically improved, near baseline and ambulating without difficulty on RA.   STUDIES:  ECHO 5/27>>>EF 40-45%, Gr 1 DD  , Akinesis apical /inferior  (Prev echo 25-30%)  LINES/TUBES:  OETT 5/27> 5/28   MICRO/ ABX:  MRSA PCR neg 5/27 TA>> few MSSA 5/27 Bld x2>> neg  5/27 Urine>> 100k E.coli (S- amp, ancef, rocephin, gent, nitro, zosyn; R-levaquin, cipro, bactrim)  levaquin 5/27>>>6/2 azactam 5/27>>>5/30 Keflex 6/2>>6/5   Discharge Plan by Discharge Diagnosis  Acute on chronic hypercarbic respiratory failure  Pulmonary edema  ?CAP - more likely heart failure but completed course abx given few staph in sputum culture as above  COPD  D/c plan --  Continue symbicort, PRN albuterol  outpt pulm f/u  Continue lasix 40mg  daily with outpt cards f/u as below  F/u CXR  PPI for GERD  S/p 7 day course abx for ?CAP  Decompensated acute on chronic diastolic HF H/o HTN Pulmonary edema  D/c plan --  Lasix 40mg  daily  D/c on coreg, losartan as below per cardiology recs  outpt cardiology f/u  BMET in 1 week - arranged    Mild acute on chronic renal failure (looks like baseline cr ~1.2-1.3) Minimal lactic acidosis  Hypokalemia >resolved  Hypophos >resolved D/c plan --  PCP f/u  F/u chemistry 1 week  Continue lasix as above   ?UTI - asymptomatic. EColi on culture. Resistant to levaquin although pt improved s/p course levaquin D/c plan --  Will d/c on short course cephalexin given levaquin resistance on culture  PCP f/u   Suspected gout - much improved when lyrica resumed. Not much improvement with colchicine.  D/c plan --  PCP management  Will hold colchicine at d/c given minimal improvement (although only on it x 2 days) with borderline renal function and need for diuresis     01/29/2015 extended f/u ov/[name] re: post hosp/ resp failure resolved ? All chf ? / GOLDI III copd/ not using med calendar provided Chief Complaint  Patient presents with  . HFU    Pt states his breathing has improved but not back to  normal baseline. He still has some cough- prod with clear sputum.    cough worse  first thing in am/ nothing purulent and does not wake him prematurely/ really Not really limited by breathing from desired activities but rather by fatigue. Not using saba  / has help at home but no clear "master MAR" they follow and only using symb one bid with suboptimal hfa (see a/p) Pt very confused with details of care and care taker not clear either "I don't give any meds on my shift"  Q what about prns A I don't know.   No obvious day to day or daytime variabilty or assoc  cp or chest tightness, subjective wheeze overt sinus or hb  symptoms. No unusual exp hx or h/o childhood pna/ asthma or knowledge of premature birth.  Sleeping ok without nocturnal  or early am exacerbation  of respiratory  c/o's or need for noct saba. Also denies any obvious fluctuation of symptoms with weather or environmental changes or other aggravating or alleviating factors except as outlined above   Current Medications, Allergies, Complete Past Medical History, Past Surgical History, Family History, and Social History were reviewed in [employer] record.  ROS  The following are not active complaints unless bolded sore throat, dysphagia, dental problems, itching, sneezing,  nasal congestion or excess/ purulent secretions, ear ache,   fever, chills, sweats, unintended wt loss, pleuritic or exertional cp, hemoptysis,  orthopnea pnd or leg swelling, presyncope, palpitations, heartburn, abdominal pain, anorexia, nausea, vomiting, diarrhea  or change in bowel or urinary habits, change in stools or urine, dysuria,hematuria,  rash, arthralgias, visual complaints, headache, numbness weakness or ataxia or problems with walking or coordination,  change in mood/affect or memory.           Objective:   Physical Exam  11/13/2013    176>176 12/05/13 > 01/23/2014  180 >  01/29/2015 166  Vital signs reviewed/ note low bp but no  orthostatic symptoms     HEENT mild turbinate edema.  Oropharynx nl / no  excess pnd or cobblestoning.  No JVD or cervical adenopathy. Mild accessory muscle hypertrophy. Trachea midline, nl thryroid. Chest was hyperinflated by percussion with diminished breath sounds and moderate increased exp time without wheeze. Hoover sign positive at mid inspiration. Regular rate and rhythm without murmur gallop or rub or increase P2 or edema.  Abd: no hsm, nl excursion. Ext warm without cyanosis or clubbing.      CXR PA and Lateral:   01/29/2015 :     I personally reviewed images and agree with radiology impression as follows:    Lungs are hyperexpanded. Lungs are clear except for minimal subsegmental atelectasis in the left base currently. There has been interval clearing of atelectasis from both lung bases compared to recent prior study. No new opacity present currently.    Chemistry      Component Value Date/Time   NA 138 01/21/2015 0929   K 5.3* 01/21/2015 0929   CL 102 01/21/2015 0929   CO2 33* 01/21/2015 0929   BUN 36* 01/21/2015 0929   CREATININE 1.36 01/21/2015 0929   CREATININE 1.10 04/24/2011 1200      Component Value Date/Time   CALCIUM 9.6 01/21/2015 0929   ALKPHOS 92 01/08/2015 0725   AST 45* 01/08/2015 0725   ALT 25 01/08/2015 0725   BILITOT 1.0 01/08/2015 0725         Assessment & Plan:   Outpatient Encounter Prescriptions as of 01/29/2015  Medication Sig  . acetaminophen (TYLENOL) 325 MG tablet Take 650 mg by mouth every 6 (six) hours as needed for mild pain.   Marland Kitchen albuterol (VENTOLIN HFA) 108 (90 BASE) MCG/ACT inhaler Inhale 2 puffs into the lungs every 4 (four) hours as needed for wheezing or shortness of breath.  Marland Kitchen aspirin 81 MG tablet Take 81 mg by mouth daily.  . budesonide-formoterol (SYMBICORT) 160-4.5 MCG/ACT inhaler Take 2 puffs first thing in am and then another 2 puffs about 12 hours later.  . carvedilol (COREG) 12.5 MG tablet Take 1 tablet (12.5 mg total) by mouth  2 (two) times daily with a meal.  . furosemide (LASIX) 20 MG tablet Take 2 tablets (40 mg total) by mouth  daily.  . gemfibrozil (LOPID) 600 MG tablet Take 600 mg by mouth 2 (two) times daily before a meal.  . losartan (COZAAR) 25 MG tablet Take 1 tablet (25 mg total) by mouth daily.  . meclizine (ANTIVERT) 25 MG tablet Take 1 tablet (25 mg total) by mouth daily as needed for dizziness.  . mirabegron ER (MYRBETRIQ) 50 MG TB24 Take 50 mg by mouth at bedtime.   Marland Kitchen NITROSTAT 0.4 MG SL tablet PLACE 1 TABLET (0.4 MG TOTAL) UNDER THE TONGUE EVERY 5 (FIVE) MINUTES AS NEEDED. FOR CHEST PAIN  . omeprazole (PRILOSEC) 20 MG capsule Take 1 capsule (20 mg total) by mouth 2 (two) times daily before a meal.  . polyethylene glycol powder (MIRALAX) powder Take 17 g by mouth daily as needed. For constipation  . pregabalin (LYRICA) 75 MG capsule Take 75 mg by mouth daily.  . simvastatin (ZOCOR) 10 MG tablet Take 1 tablet (10 mg total) by mouth daily.   No facility-administered encounter medications on file as of 01/29/2015.

## 2015-01-29 NOTE — Progress Notes (Signed)
Patient ID: REYNOLDO MAINER, male   DOB: June 21, 1924, 81 y.o.   MRN: [id number]     Patient Name: [name] Date of Encounter: 01/29/2015  Primary Care Provider:   Melinda Crutch, MD Primary Cardiologist:  Dorothy Spark [facility])  Problem List   Past Medical History  Diagnosis Date  . Carotid artery stenosis   . Essential hypertension   . Myocardial infarction 1979  . Coronary artery disease     Known occluded proximal LAD with collaterals, moderate RCA disease - managed medically 2009  . COPD (chronic obstructive pulmonary disease)   . History of stroke   . Prostate cancer   . Arthritis   . Foot drop, right   . Neuropathy   . Anemia   . Urinary incontinence, male, stress   . GERD (gastroesophageal reflux disease)   . Thalassemia   . Wears dentures   . Deafness in left ear     Wears hearing aide in right ear  . Sleep apnea     CPAP  . Ischemic cardiomyopathy     LVEF 40-45%   Past Surgical History  Procedure Laterality Date  . Penile prosthesis implant    . Cholecystectomy    . Right carotid endarterectomy    . Back surgery    . Lumbar laminectomy  03/24/10    L3-4 and L4-5  . Ankle surgery      Bone infection  . Cardiac catheterization    . Appendectomy    . Joint replacement Left   . Radioactive seed implant  2009  . Pars plana vitrectomy  03/19/2012    Procedure: PARS PLANA VITRECTOMY WITH 25 GAUGE;  Surgeon: Hayden Pedro, MD;  Location: [place];  Service: Ophthalmology;  Laterality: Right;  . Carotid endarterectomy Right     CEA - 15 yrs ago  . Spine surgery    . Eye surgery    . Cataract extraction w/ intraocular lens implant Left   . Endarterectomy Left 09/28/2014    Procedure: Left Carotid Endarterectomy;  Surgeon: Rosetta Posner, MD;  Location: [place];  Service: Vascular;  Laterality: Left;  . Patch angioplasty Left 09/28/2014    Procedure: WITH DACRON PATCH ANGIOPLASTY;  Surgeon: Rosetta Posner, MD;  Location: [place];  Service: Vascular;  Laterality:  Left;    Allergies  Allergies  Allergen Reactions  . Amoxicillin     REACTION: unspecified  . Clopidogrel Bisulfate Hives    REACTION: red blue, black blotches  . Dilaudid [Hydromorphone Hcl]   . Penicillins   . Sulfa Antibiotics   . Sulfamethoxazole     REACTION: unspecified  . Sulfonamide Derivatives    Chief complain: Follow up after hospitalization, DOE  HPI  Mr. Aker returns today for evaluation and management of his coronary artery disease, chronic diastolic heart failure, and carotid artery disease. I saw him last a year ago when he was doing great. In the beginning of June he was admitted with acute respiratory failure and diagnosed with pneumonia, he was also treated for acute on chronic systolic CHF,and had mild troponin elevation that was believed to be demand ischemia.  He is coming after 2 weeks, he states that he feels better and now has 2 pillow orthopnea from 4 pillows, no PND, no chest pain, but significant DOE. He develope LE edema every afternoon. No palpitations or syncope.   Home Medications  Prior to Admission medications   Medication Sig Start Date End Date Taking?  Authorizing Provider  acetaminophen (TYLENOL) 650 MG CR tablet Take 650 mg by mouth every 8 (eight) hours as needed. For pain   Yes Historical Provider, MD  albuterol (VENTOLIN HFA) 108 (90 BASE) MCG/ACT inhaler Inhale 2 puffs into the lungs every 6 (six) hours as needed for wheezing or shortness of breath.   Yes Historical Provider, MD  aspirin 81 MG tablet Take 81 mg by mouth daily.   Yes Historical Provider, MD  budesonide-formoterol (SYMBICORT) 160-4.5 MCG/ACT inhaler Inhale 2 puffs into the lungs 2 (two) times daily.   Yes Historical Provider, MD  carvedilol (COREG) 12.5 MG tablet Take 6.25 mg by mouth 2 (two) times daily with a meal.   Yes Historical Provider, MD  docusate sodium (COLACE) 100 MG capsule Take 100 mg by mouth daily as needed. For constipation   Yes Historical Provider, MD    furosemide (LASIX) 20 MG tablet Take 1 tablet (20 mg total) by mouth daily. 11/11/12  Yes Renella Cunas, MD  gemfibrozil (LOPID) 600 MG tablet Take 600 mg by mouth 2 (two) times daily before a meal.   Yes Historical Provider, MD  mirabegron ER (MYRBETRIQ) 50 MG TB24 Take 50 mg by mouth daily.   Yes Historical Provider, MD  Multiple Vitamins-Minerals (EYE VITAMINS) CAPS Take 1 capsule by mouth daily.   Yes Historical Provider, MD  nitroGLYCERIN (NITROSTAT) 0.4 MG SL tablet Place 1 tablet (0.4 mg total) under the tongue every 5 (five) minutes as needed. For chest pain 05/15/12  Yes Renella Cunas, MD  polyethylene glycol powder (MIRALAX) powder Take 17 g by mouth daily as needed. For constipation   Yes Historical Provider, MD  Pregabalin (LYRICA PO) Take 1 tablet by mouth 2 (two) times daily.   Yes Historical Provider, MD  sennosides-docusate sodium (SENOKOT-S) 8.6-50 MG tablet Take 1 tablet by mouth daily as needed. For constipation   Yes Historical Provider, MD  Tiotropium Bromide Monohydrate 2.5 MCG/ACT AERS Inhale 2 puffs each morning 10/30/13  Yes Tanda Rockers, MD    Family History  Family History  Problem Relation Age of Onset  . Thalassemia      Family history  . Cancer Mother 8    Bone cancer  . Diabetes Mother   . Heart disease Mother   . Heart attack Mother   . Hypertension Mother   . Stroke Father 53  . Heart disease Father   . Hypertension Father   . Hypertension Sister   . Diabetes Sister   . Heart disease Brother   . Heart attack Brother 77  . Cancer Brother     Social History  History   Social History  . Marital Status: Married    Spouse Name: N/A  . Number of Children: N/A  . Years of Education: N/A   Occupational History  . Not on file.   Social History Main Topics  . Smoking status: Former Smoker -- 1.00 packs/day for 20 years    Types: Cigarettes    Quit date: 08/14/1977  . Smokeless tobacco: Never Used  . Alcohol Use: No     Comment: occasional   . Drug Use: No  . Sexual Activity: Not on file   Other Topics Concern  . Not on file   Social History Narrative     Review of Systems, as per HPI, otherwise negative General:  No chills, fever, night sweats or weight changes.  Cardiovascular:  No chest pain, dyspnea on exertion, edema, orthopnea, palpitations, paroxysmal nocturnal dyspnea.  Dermatological: No rash, lesions/masses Respiratory: No cough, dyspnea Urologic: No hematuria, dysuria Abdominal:   No nausea, vomiting, diarrhea, bright red blood per rectum, melena, or hematemesis Neurologic:  No visual changes, wkns, changes in mental status. All other systems reviewed and are otherwise negative except as noted above.  Physical Exam  Blood pressure 116/42, pulse 61, height 5\' 11"  (1.803 m), weight 165 lb (74.844 kg).  General: Pleasant, NAD Psych: Normal affect. Neuro: Alert and oriented X 3. Moves all extremities spontaneously. HEENT: Normal  Neck: Supple without bruits or JVD. Lungs:  Resp regular and unlabored, CTA. Heart: RRR no s3, s4, or murmurs. Abdomen: Soft, non-tender, non-distended, BS + x 4.  Extremities: No clubbing, cyanosis, mild perimalleolar pitting edema of the right lower extremity. DP/PT/Radials 2+ and equal bilaterally.  Labs:  No results for input(s): CKTOTAL, CKMB, TROPONINI in the last 72 hours. Lab Results  Component Value Date   WBC 8.8 01/11/2015   HGB 10.2* 01/11/2015   HCT 32.3* 01/11/2015   MCV 61.4* 01/11/2015   PLT 150 01/11/2015    Lab Results  Component Value Date   DDIMER * 12/18/2008    1.60        AT THE INHOUSE ESTABLISHED CUTOFF VALUE OF 0.48 ug/mL FEU, THIS ASSAY HAS BEEN DOCUMENTED IN THE LITERATURE TO HAVE A SENSITIVITY AND NEGATIVE PREDICTIVE VALUE OF AT LEAST 98 TO 99%.  THE TEST RESULT SHOULD BE CORRELATED WITH AN ASSESSMENT OF THE CLINICAL PROBABILITY OF DVT / VTE.   Invalid input(s): POCBNP    Component Value Date/Time   NA 138 01/21/2015 0929   K 5.3*  01/21/2015 0929   CL 102 01/21/2015 0929   CO2 33* 01/21/2015 0929   GLUCOSE 81 01/21/2015 0929   BUN 36* 01/21/2015 0929   CREATININE 1.36 01/21/2015 0929   CREATININE 1.10 04/24/2011 1200   CALCIUM 9.6 01/21/2015 0929   PROT 7.9 01/08/2015 0725   ALBUMIN 3.6 01/08/2015 0725   AST 45* 01/08/2015 0725   ALT 25 01/08/2015 0725   ALKPHOS 92 01/08/2015 0725   BILITOT 1.0 01/08/2015 0725   GFRNONAA 42* 01/13/2015 0651   GFRAA 49* 01/13/2015 0651   Lab Results  Component Value Date   CHOL 218* 11/17/2013   HDL 42.60 11/17/2013   LDLCALC 152* 11/17/2013   TRIG 116.0 11/17/2013    Accessory Clinical Findings  Echocardiogram 12/18/2008  Left ventricle: Very poor acoustic window limits study. LVEF is depressed at approximately 25 to30% with akinesis the the inferior wall, inferoseptal wall, apex, distal lateral, mid/distal anterior; hypokinesis/akinesis inferiorly. The cavity size was normal. Wall thickness was normal. 2. Mitral valve: Mildly to moderately calcified annulus. Mildly thickened leaflets . Mild regurgitation.  Echocardiogram 01/09/2015  Left ventricle: The cavity size was normal. Wall thickness was increased in a pattern of mild LVH. Systolic function was mildly to moderately reduced. The estimated ejection fraction was in the range of 40% to 45%. There is akinesis of the mid-apicalanteroseptal and apical myocardium. There is akinesis of the apicalinferior myocardium. Doppler parameters are consistent with abnormal left ventricular relaxation (grade 1 diastolic dysfunction). Doppler parameters are consistent with high ventricular filling pressure. - Aortic valve: Mildly calcified annulus. Trileaflet. - Mitral valve: Calcified annulus. Mildly calcified leaflets . - Left atrium: The atrium was mildly dilated. - Right atrium: Central venous pressure (est): 3 mm Hg. - Atrial septum: No defect or patent foramen ovale was identified. - Tricuspid valve:  There was trivial regurgitation. - Pulmonary arteries: Systolic pressure could not be  accurately estimated. - Pericardium, extracardiac: There was no pericardial effusion.  Impressions:  - Normal LV wall thickness with LVEF approximately 40-45%. Mid to apical anteroseptal and inferoapical akinesis suggests ischemic cardiomyopathy. Definity contrast shows swirling and slow flow at the apex, but no definitive mural thrombus. Grade 1 diastolic dysfunction with increased filling pressures. Mild left atrial enlargement. Trivial tricuspid regurgitation, unable to assess PASP.  ECG - sinus rhythm, new inferolateral negative T waves.    Assessment & Plan  81 y.o.male with PMH of HTN, CAD, COPD, ICM, and chronic combined HF, recently admitted with acute on chronic hypercarbic respiratory failure requiring temporary ventilatory support. Reports worsening LE edema for wks  1. Acute on chronic combined systolic and diastolic HF - Echo 04/20/5299 EF 25-30%, EF now improved 40-45% - Echo 01/09/15 LV EF of 40-45% with grade 1 diastolic dysfunction and increased filling pressures - Per pt, has been compliant with low Na diet and fluid restriction at home, although does not measure daily weight. Today 165 lbs (baseline 161 lbs) -Continue lasix 40mg , he has minimal fluid overload on exam  2. Mildly elevated trop - Likely demand, however new inferolateral ST depressions and negative T waves - Pt is symptomatic with DOE - we will schedule a Lexiscan nuclear stress test in 2 weeks - 4 weeks post pneumonia  3. CAD, known occluded proximal LAD with collaterals and moderate RCA disease 2009 - Continue ASA, statin, BB and ARB, Lexiscan as above  4. ICM - improved 5. COPD, GOLD III  6. Acute renal insufficiency - crea improved 1.36 from 1.7  Follow up in 3 months.  Dorothy Spark, MD, [facility] 01/29/2015, 10:43 AM

## 2015-01-30 ENCOUNTER — Encounter: Payer: Self-pay | Admitting: Internal Medicine

## 2015-01-30 NOTE — Assessment & Plan Note (Addendum)
-   spirometry 11/13/2013 >  FEV1  1.46 (45%) ratio 52 - 11/13/2013  Walked RA x 3 laps @ 185 ft each stopped due to  End of study no desats  -12/05/13 Med calendar created > not using 01/23/2014 or 01/29/15   The proper method of use, as well as anticipated side effects, of a metered-dose inhaler are discussed and demonstrated to the patient. Improved effectiveness after extensive coaching during this visit to a level of approximately  75% from a baseline of < 25%   He is back near baseline now but has severe copd and risk of exacerbations/ poor reserve (note last [id number] 33 so well may be chronically hypercarbic also)   I had an extended final summary discussion with the patient and care provider reviewing all relevant studies completed to date and  lasting 30 minutes of a 45 minute visit on the following issues:    1) there does not appear to be master list or equivalent of MAR that sorts out maint vs prns for him (we provided that previously but obviously not using and misunderstood how/ when to use symbicort vs saba/ reviewed  2) if confusion re med reconciliation persists, strongly rec:  To keep things simple, I have asked the patient to first separate medicines that are perceived as maintenance, that is to be taken daily "no matter what", from those medicines that are taken on only on an as-needed basis and I have given the patient examples of both, and then return to see our NP to generate a  detailed  medication calendar which should be followed until the next physician sees the patient and updates it.    3) in meantime pulmonary f/u can be prn  4) Each maintenance medication was reviewed in detail including most importantly the difference between maintenance and as needed and under what circumstances the prns are to be used.  Please see instructions for details which were reviewed in writing and the patient given a copy.

## 2015-01-30 NOTE — Assessment & Plan Note (Signed)
Few mssa on trach culture but I strongly doubt there was MSSA pna here > cxr clear > nothing else needed

## 2015-02-01 ENCOUNTER — Telehealth: Payer: Self-pay | Admitting: Internal Medicine

## 2015-02-01 ENCOUNTER — Ambulatory Visit (INDEPENDENT_AMBULATORY_CARE_PROVIDER_SITE_OTHER): Payer: Medicare Other | Admitting: Ophthalmology

## 2015-02-01 NOTE — Telephone Encounter (Signed)
Result Note     Call pt: Reviewed cxr and no acute change so no change in recommendations made at ov  ---  I spoke with patient about results and he verbalized understanding and had no questions 

## 2015-02-01 NOTE — Progress Notes (Signed)
Quick Note:  lmtcb ______ 

## 2015-02-13 ENCOUNTER — Other Ambulatory Visit: Payer: Self-pay | Admitting: Internal Medicine

## 2015-02-17 ENCOUNTER — Telehealth (HOSPITAL_COMMUNITY): Payer: Self-pay | Admitting: *Deleted

## 2015-02-17 NOTE — Telephone Encounter (Signed)
Patient given detailed instructions per Myocardial Perfusion Study Information Sheet for test on 02/22/15 at 8:15. Patient Notified to arrive 15 minutes early, and that it is imperative to arrive on time for appointment to keep from having the test rescheduled. Patient verbalized understanding. Hubbard Robinson, RN

## 2015-02-19 ENCOUNTER — Encounter: Payer: Self-pay | Admitting: Cardiology

## 2015-02-19 ENCOUNTER — Encounter: Payer: Self-pay | Admitting: *Deleted

## 2015-02-22 ENCOUNTER — Ambulatory Visit (HOSPITAL_COMMUNITY): Payer: Medicare Other | Attending: Internal Medicine

## 2015-02-22 DIAGNOSIS — Z8249 Family history of ischemic heart disease and other diseases of the circulatory system: Secondary | ICD-10-CM | POA: Insufficient documentation

## 2015-02-22 DIAGNOSIS — I252 Old myocardial infarction: Secondary | ICD-10-CM | POA: Insufficient documentation

## 2015-02-22 DIAGNOSIS — I25810 Atherosclerosis of coronary artery bypass graft(s) without angina pectoris: Secondary | ICD-10-CM | POA: Diagnosis not present

## 2015-02-22 DIAGNOSIS — I10 Essential (primary) hypertension: Secondary | ICD-10-CM | POA: Insufficient documentation

## 2015-02-22 DIAGNOSIS — E785 Hyperlipidemia, unspecified: Secondary | ICD-10-CM | POA: Insufficient documentation

## 2015-02-22 DIAGNOSIS — R9439 Abnormal result of other cardiovascular function study: Secondary | ICD-10-CM | POA: Insufficient documentation

## 2015-02-22 DIAGNOSIS — I2510 Atherosclerotic heart disease of native coronary artery without angina pectoris: Secondary | ICD-10-CM | POA: Insufficient documentation

## 2015-02-22 DIAGNOSIS — R0609 Other forms of dyspnea: Secondary | ICD-10-CM | POA: Insufficient documentation

## 2015-02-22 DIAGNOSIS — Z87891 Personal history of nicotine dependence: Secondary | ICD-10-CM | POA: Diagnosis not present

## 2015-02-22 DIAGNOSIS — R079 Chest pain, unspecified: Secondary | ICD-10-CM | POA: Insufficient documentation

## 2015-02-22 DIAGNOSIS — I214 Non-ST elevation (NSTEMI) myocardial infarction: Secondary | ICD-10-CM | POA: Diagnosis not present

## 2015-02-22 DIAGNOSIS — I4891 Unspecified atrial fibrillation: Secondary | ICD-10-CM | POA: Insufficient documentation

## 2015-02-22 LAB — MYOCARDIAL PERFUSION IMAGING
LV dias vol: 130 mL
LV sys vol: 86 mL
Peak HR: 80 {beats}/min
RATE: 0.27
Rest HR: 64 {beats}/min
SDS: 5
SRS: 30
SSS: 35
TID: 1

## 2015-02-22 MED ORDER — TECHNETIUM TC 99M SESTAMIBI GENERIC - CARDIOLITE
30.5000 | Freq: Once | INTRAVENOUS | Status: AC | PRN
  Administered 2015-02-22: 31 via INTRAVENOUS

## 2015-02-22 MED ORDER — REGADENOSON 0.4 MG/5ML IV SOLN
0.4000 mg | Freq: Once | INTRAVENOUS | Status: AC
  Administered 2015-02-22: 0.4 mg via INTRAVENOUS

## 2015-02-22 MED ORDER — TECHNETIUM TC 99M SESTAMIBI GENERIC - CARDIOLITE
10.1000 | Freq: Once | INTRAVENOUS | Status: AC | PRN
  Administered 2015-02-22: 10.1 via INTRAVENOUS

## 2015-02-24 ENCOUNTER — Encounter: Payer: Self-pay | Admitting: Nurse Practitioner

## 2015-02-24 ENCOUNTER — Ambulatory Visit (INDEPENDENT_AMBULATORY_CARE_PROVIDER_SITE_OTHER): Payer: Medicare Other | Admitting: Nurse Practitioner

## 2015-02-24 VITALS — BP 118/58 | HR 68 | Ht 71.0 in | Wt 166.8 lb

## 2015-02-24 DIAGNOSIS — R9439 Abnormal result of other cardiovascular function study: Secondary | ICD-10-CM | POA: Diagnosis not present

## 2015-02-24 DIAGNOSIS — I10 Essential (primary) hypertension: Secondary | ICD-10-CM

## 2015-02-24 DIAGNOSIS — I25810 Atherosclerosis of coronary artery bypass graft(s) without angina pectoris: Secondary | ICD-10-CM | POA: Diagnosis not present

## 2015-02-24 DIAGNOSIS — I5022 Chronic systolic (congestive) heart failure: Secondary | ICD-10-CM

## 2015-02-24 NOTE — Progress Notes (Signed)
CARDIOLOGY OFFICE NOTE  Date:  02/24/2015    [name] Date of Birth: 04-12-1924 Medical Record #[id number]  PCP:   Melinda Crutch, MD  Cardiologist:  Meda Coffee    Chief Complaint  Patient presents with  . Abnormal Myoview    Seen for Dr. Meda Coffee    History of Present Illness: [name] is a 81 y.o. male who presents today for a work in visit today. Seen for Dr. Meda Coffee. He has coronary artery disease with remote MI in 1979 - last cath from 2010 - has been managed medically, chronic systolic/diastolic heart failure, and carotid artery disease.  Back in June he was admitted with acute respiratory failure and diagnosed with pneumonia. He was also treated for acute on chronic systolic CHF,and had mild troponin elevation that was believed to be demand ischemia. Saw Dr. Meda Coffee 2 weeks after discharge - had improved but still with DOE. She arranged for Myoview testing - see below.   Now comes back for discussion.  Comes in today. Here with his daughter. Very hard of hearing but able to understand. He says he is doing ok - getting stronger. Has around the clock the care since his admission last month. He does not have chest pain. He has shortness of breath with exertion but notes that it is at his baseline and is not worse in any way and that it has been this way "forever". His primary issue is his balance - this is why he has around the clock care. Seeing neurology next week for his balance/neuropathy issues.   Past Medical History  Diagnosis Date  . Carotid artery stenosis   . Essential hypertension   . Myocardial infarction 1979  . Coronary artery disease     Known occluded proximal LAD with collaterals, moderate RCA disease - managed medically 2009  . COPD (chronic obstructive pulmonary disease)   . History of stroke   . Prostate cancer   . Arthritis   . Foot drop, right   . Neuropathy   . Anemia   . Urinary incontinence, male, stress   . GERD (gastroesophageal reflux disease)   .  Thalassemia   . Wears dentures   . Deafness in left ear     Wears hearing aide in right ear  . Sleep apnea     CPAP  . Ischemic cardiomyopathy     LVEF 40-45%    Past Surgical History  Procedure Laterality Date  . Penile prosthesis implant    . Cholecystectomy    . Right carotid endarterectomy    . Back surgery    . Lumbar laminectomy  03/24/10    L3-4 and L4-5  . Ankle surgery      Bone infection  . Cardiac catheterization    . Appendectomy    . Joint replacement Left   . Radioactive seed implant  2009  . Pars plana vitrectomy  03/19/2012    Procedure: PARS PLANA VITRECTOMY WITH 25 GAUGE;  Surgeon: Hayden Pedro, MD;  Location: [facility];  Service: Ophthalmology;  Laterality: Right;  . Carotid endarterectomy Right     CEA - 15 yrs ago  . Spine surgery    . Eye surgery    . Cataract extraction w/ intraocular lens implant Left   . Endarterectomy Left 09/28/2014    Procedure: Left Carotid Endarterectomy;  Surgeon: Rosetta Posner, MD;  Location: [facility];  Service: Vascular;  Laterality: Left;  . Patch angioplasty Left 09/28/2014  Procedure: WITH DACRON PATCH ANGIOPLASTY;  Surgeon: Rosetta Posner, MD;  Location: [facility] OR;  Service: Vascular;  Laterality: Left;     Medications: Current Outpatient Prescriptions  Medication Sig Dispense Refill  . acetaminophen (TYLENOL) 325 MG tablet Take 650 mg by mouth every 6 (six) hours as needed for mild pain.     Marland Kitchen albuterol (VENTOLIN HFA) 108 (90 BASE) MCG/ACT inhaler Inhale 2 puffs into the lungs every 4 (four) hours as needed for wheezing or shortness of breath. 1 Inhaler 11  . aspirin 81 MG tablet Take 81 mg by mouth daily.    . carvedilol (COREG) 12.5 MG tablet Take 1 tablet (12.5 mg total) by mouth 2 (two) times daily with a meal. 30 tablet 0  . furosemide (LASIX) 20 MG tablet Take 2 tablets (40 mg total) by mouth daily. 60 tablet 0  . gemfibrozil (LOPID) 600 MG tablet Take 600 mg by mouth 2 (two) times daily before a meal.    . losartan  (COZAAR) 25 MG tablet Take 1 tablet (25 mg total) by mouth daily. 30 tablet 0  . meclizine (ANTIVERT) 25 MG tablet Take 1 tablet (25 mg total) by mouth daily as needed for dizziness. 30 tablet 6  . NITROSTAT 0.4 MG SL tablet PLACE 1 TABLET (0.4 MG TOTAL) UNDER THE TONGUE EVERY 5 (FIVE) MINUTES AS NEEDED. FOR CHEST PAIN 25 tablet 0  . omeprazole (PRILOSEC) 20 MG capsule Take 1 capsule (20 mg total) by mouth 2 (two) times daily before a meal. 180 capsule 3  . polyethylene glycol powder (MIRALAX) powder Take 17 g by mouth daily as needed. For constipation    . pregabalin (LYRICA) 75 MG capsule Take 75 mg by mouth daily.    . simvastatin (ZOCOR) 10 MG tablet Take 1 tablet (10 mg total) by mouth daily. 30 tablet 6  . SYMBICORT 160-4.5 MCG/ACT inhaler INHALE 2 PUFFS BY MOUTH FIRST THING IN THE MORNING AND THEN TAKE 2 PUFFS 12 HOURS LATER 10.2 Inhaler 4   No current facility-administered medications for this visit.    Allergies: Allergies  Allergen Reactions  . Amoxicillin     REACTION: unspecified  . Clopidogrel Bisulfate Hives    REACTION: red blue, black blotches  . Dilaudid [Hydromorphone Hcl]   . Penicillins   . Sulfa Antibiotics   . Sulfamethoxazole     REACTION: unspecified  . Sulfonamide Derivatives     Social History: The patient  reports that he quit smoking about 37 years ago. His smoking use included Cigarettes. He has a 20 pack-year smoking history. He has never used smokeless tobacco. He reports that he does not drink alcohol or use illicit drugs.   Family History: The patient's family history includes Cancer in his brother; Cancer (age of onset: 65) in his mother; Diabetes in his mother and sister; Heart attack in his mother; Heart attack (age of onset: 46) in his brother; Heart disease in his brother, father, and mother; Hypertension in his father, mother, and sister; Stroke (age of onset: 22) in his father; Thalassemia in an other family member.   Review of Systems: Please  see the history of present illness.   Otherwise, the review of systems is positive for leg swelling, cough, DOE, dizziness, wheezing and easy bruising.   All other systems are reviewed and negative.   Physical Exam: VS:  BP 118/58 mmHg  Pulse 68  Ht 5\' 11"  (1.803 m)  Wt 166 lb 12.8 oz (75.66 kg)  BMI 23.27 kg/m2  SpO2 96% .  BMI Body mass index is 23.27 kg/(m^2).  Wt Readings from Last 3 Encounters:  02/24/15 166 lb 12.8 oz (75.66 kg)  02/22/15 165 lb (74.844 kg)  01/29/15 166 lb 3.2 oz (75.388 kg)    General: Pleasant. He is elderly, looks chronically ill but in no acute distress. Bruised. Using a walker.   HEENT: Normal. Neck: Supple, no JVD, carotid bruits, or masses noted.  Cardiac: Regular rate and rhythm. No edema.  Respiratory:  Lungs are clear to auscultation bilaterally with normal work of breathing.  GI: Soft and nontender.  MS: No deformity or atrophy. Gait and ROM intact.Has braces on both lower legs. Using a walker.  Skin: Warm and dry. Color is normal.  Neuro:  Strength and sensation are intact and no gross focal deficits noted.  Psych: Alert, appropriate and with normal affect.   LABORATORY DATA:  EKG:  EKG is not ordered today.  Lab Results  Component Value Date   WBC 8.8 01/11/2015   HGB 10.2* 01/11/2015   HCT 32.3* 01/11/2015   PLT 150 01/11/2015   GLUCOSE 81 01/21/2015   CHOL 218* 11/17/2013   TRIG 116.0 11/17/2013   HDL 42.60 11/17/2013   LDLCALC 152* 11/17/2013   ALT 25 01/08/2015   AST 45* 01/08/2015   NA 138 01/21/2015   K 5.3* 01/21/2015   CL 102 01/21/2015   CREATININE 1.36 01/21/2015   BUN 36* 01/21/2015   CO2 33* 01/21/2015   TSH 1.718 04/30/2011   INR 1.30 01/08/2015   HGBA1C 6.6* 05/04/2011    BNP (last 3 results)  Recent Labs  01/08/15 0729 01/08/15 1000  BNP 569.6* 439.7*    ProBNP (last 3 results) No results for input(s): PROBNP in the last 8760 hours.   Other Studies Reviewed Today:  Echocardiogram  12/18/2008  Left ventricle: Very poor acoustic window limits study. LVEF is depressed at approximately 25 to30% with akinesis the the inferior wall, inferoseptal wall, apex, distal lateral, mid/distal anterior; hypokinesis/akinesis inferiorly. The cavity size was normal. Wall thickness was normal. 2. Mitral valve: Mildly to moderately calcified annulus. Mildly thickened leaflets . Mild regurgitation.  Echocardiogram 01/09/2015  Left ventricle: The cavity size was normal. Wall thickness was increased in a pattern of mild LVH. Systolic function was mildly to moderately reduced. The estimated ejection fraction was in the range of 40% to 45%. There is akinesis of the mid-apicalanteroseptal and apical myocardium. There is akinesis of the apicalinferior myocardium. Doppler parameters are consistent with abnormal left ventricular relaxation (grade 1 diastolic dysfunction). Doppler parameters are consistent with high ventricular filling pressure. - Aortic valve: Mildly calcified annulus. Trileaflet. - Mitral valve: Calcified annulus. Mildly calcified leaflets . - Left atrium: The atrium was mildly dilated. - Right atrium: Central venous pressure (est): 3 mm Hg. - Atrial septum: No defect or patent foramen ovale was identified. - Tricuspid valve: There was trivial regurgitation. - Pulmonary arteries: Systolic pressure could not be accurately estimated. - Pericardium, extracardiac: There was no pericardial effusion.  Impressions:  - Normal LV wall thickness with LVEF approximately 40-45%. Mid to apical anteroseptal and inferoapical akinesis suggests ischemic cardiomyopathy. Definity contrast shows swirling and slow flow at the apex, but no definitive mural thrombus. Grade 1 diastolic dysfunction with increased filling pressures. Mild left atrial enlargement. Trivial tricuspid regurgitation, unable to assess PASP.  ECG - sinus rhythm, new inferolateral  negative T waves.  Myoview Interpretation Summary from 02/2015    Nuclear stress EF: 34%.  There was no ST  segment deviation noted during stress.  This is a high risk study.  The left ventricular ejection fraction is moderately decreased (30-44%).  High risk stress nuclear study with a large, severe, predominantly fixed defect in the anteroseptal, apical and inferior walls (mild reversibility in the inferior lateral wall); findings consistent with large prior infarct and mild peri-infarct ischemia in the inferior lateral wall; EF 34 with akinesis of the anteroseptal, apical and inferior walls. Study high risk due to reduced LV function.    ANGIOGRAPHIC FINDINGS: 1. The left main coronary artery has mild 20% plaque. 2. The left anterior descending is known to have a chronic total  occlusion in the midportion of the vessel just beyond the first  moderate-to-large size diagonal branch. This appears unchanged  from the prior cath in July 2009. The first diagonal is a moderate-  sized vessel that has a 30% plaque noted diffusely throughout the  vessel. The distal left anterior descending coronary artery fills  faintly from left-to-left collaterals. 3. The circumflex artery primarily consists of a large marginal  branch. There is diffuse 20-30% disease throughout this vessel.  There are no severely obstructive lesions noted. 4. The right coronary artery is a large dominant vessel that has a  proximal 60-70% stenosis that appears unchanged from prior cath.  The midportion of the vessel has serial 40-50% stenoses. The  distal portion of the vessel has a calcific 60-70% stenosis that  appears unchanged from prior cath. The posterior descending artery  posterolateral branch has mild plaque disease only.  HEMODYNAMIC DATA: Central aortic pressure 131/63. Left ventricular pressure 116/14. Left ventricular end-diastolic  pressure 30.  No left ventricular angiogram was performed.  IMPRESSION: 1. Double-vessel coronary artery disease that appears stable and  unchanged from cath in July 2009. 2. Acute pulmonary edema that is likely secondary to diastolic  dysfunction. The patient's blood pressure was elevated to 702  systolically when he arrived in Cath Lab.  RECOMMENDATIONS: The patient will be admitted to the CCU and monitored closely. We will continue diuresis with IV Lasix. Of note, the patient was given 40 mg of IV Lasix upon arrival to the Cath Lab and noted almost immediate improvement in his shortness of breath when he began to diurese. We will check an echocardiogram and will also check an x-ray. Laboratory values will include a D-dimer, cardiac enzymes, coagulation profile, metabolic profile, CBC and a BNP. Further planning pending the patient's response to intravenous Lasix.     Lauree Chandler, MD  Assessment & Plan  81 y.o.male with PMH of HTN, CAD, COPD, ICM, and chronic combined HF, recently admitted with acute on chronic hypercarbic respiratory failure requiring temporary ventilatory support. Reports worsening LE edema for wks  1. Chronic combined systolic and diastolic HF - he looks compensated today. His symptoms are unchanged. His shortness of breath is no different and is at his baseline.   2. Previously mildly elevated trop - Likely demand, however new inferolateral ST depressions and negative T waves - abnormal Myoview His symptoms are felt to be stable - not new - he does not wish to proceed with invasive testing. Daughter [name] agrees. They would like to continue with medical management.   3. CAD, known occluded proximal LAD with collaterals and moderate RCA disease 2009 - Continue ASA, statin, BB and ARB, Lexiscan as above  - He has opted for continued medical management.   4. CKD  5. COPD, GOLD III   6. Advanced  age  Current medicines are  reviewed with the patient today.  The patient does not have concerns regarding medicines other than what has been noted above.  The following changes have been made:  See above.  Labs/ tests ordered today include:   No orders of the defined types were placed in this encounter.     Disposition:   FU with Dr. Meda Coffee as planned in September.   Patient is agreeable to this plan and will call if any problems develop in the interim.   Signed: Burtis Junes, RN, ANP-C 02/24/2015 2:43 PM  [facility] [address] [place], [place]  [address] Phone: [phone] Fax: [phone]

## 2015-02-24 NOTE — Patient Instructions (Addendum)
We will be checking the following labs today - NONE   Medication Instructions:    Continue with your current medicines.     Testing/Procedures To Be Arranged:  N/A  Follow-Up:   See Dr. Meda Coffee as planned.    Other Special Instructions:   Watch for swelling, chest discomfort and worsening shortness of breath  Call the [place] office at [phone] if you have any questions, problems or concerns.

## 2015-03-01 ENCOUNTER — Encounter: Payer: Self-pay | Admitting: Neurology

## 2015-03-01 ENCOUNTER — Ambulatory Visit (INDEPENDENT_AMBULATORY_CARE_PROVIDER_SITE_OTHER): Payer: Medicare Other | Admitting: Neurology

## 2015-03-01 VITALS — BP 98/48 | HR 69 | Ht 72.0 in | Wt 167.8 lb

## 2015-03-01 DIAGNOSIS — G609 Hereditary and idiopathic neuropathy, unspecified: Secondary | ICD-10-CM | POA: Diagnosis not present

## 2015-03-01 DIAGNOSIS — R269 Unspecified abnormalities of gait and mobility: Secondary | ICD-10-CM | POA: Diagnosis not present

## 2015-03-01 HISTORY — DX: Unspecified abnormalities of gait and mobility: R26.9

## 2015-03-01 HISTORY — DX: Hereditary and idiopathic neuropathy, unspecified: G60.9

## 2015-03-01 NOTE — Progress Notes (Signed)
Reason for visit: Peripheral neuropathy  Referring physician: Dr. Felecia Jan is a 81 y.o. male  History of present illness:  [name] is an 81 year old left handed white male with a greater than 15 year history of a progressive peripheral neuropathy. The patient was seen previously by Dr. Erling Cruz for the peripheral neuropathy. He has developed bilateral foot drops, he requires bilateral AFO braces. The patient has been living by himself independently, but recently he became separated from his wife. He was in the hospital several months ago for congestive heart failure and respiratory issues. The patient has required 24/7 assistance in the home environment since that time, he currently is undergoing physical therapy in the home environment. The patient walks with a walker, he has not had any falls for at least 2 or 3 months. He had been operating a motor vehicle prior to going in the hospital. The patient does note some paresthesias of the feet, but overall, the use of Lyrica has been beneficial. He had significant swelling throughout the body prior to the hospital station, but ankle swelling has not been an issue since coming out of the hospital. The patient is tolerating the Lyrica well. The patient has essentially no sensation of the feet. He denies issues controlling the bowels, he has a history of prostate cancer, with some bladder control issues. He comes to this office for an evaluation. He is sleeping well at night with the neuropathy.  Past Medical History  Diagnosis Date  . Carotid artery stenosis   . Essential hypertension   . Myocardial infarction 1979  . Coronary artery disease     Known occluded proximal LAD with collaterals, moderate RCA disease - managed medically 2009  . COPD (chronic obstructive pulmonary disease)   . History of stroke   . Prostate cancer   . Arthritis   . Foot drop, right   . Neuropathy   . Anemia   . Urinary incontinence, male, stress   . GERD  (gastroesophageal reflux disease)   . Thalassemia   . Wears dentures   . Deafness in left ear     Wears hearing aide in right ear  . Sleep apnea     CPAP  . Ischemic cardiomyopathy     LVEF 40-45%  . Hypercholesteremia   . Anemia   . Osteoarthritis   . Stroke   . Heart attack   . Vertigo   . Hereditary and idiopathic peripheral neuropathy 03/01/2015  . Abnormality of gait 03/01/2015    Past Surgical History  Procedure Laterality Date  . Penile prosthesis implant    . Cholecystectomy    . Right carotid endarterectomy    . Back surgery    . Lumbar laminectomy  03/24/10    L3-4 and L4-5  . Ankle surgery      Bone infection  . Cardiac catheterization    . Appendectomy    . Joint replacement Left   . Radioactive seed implant  2009  . Pars plana vitrectomy  03/19/2012    Procedure: PARS PLANA VITRECTOMY WITH 25 GAUGE;  Surgeon: Hayden Pedro, MD;  Location: [place];  Service: Ophthalmology;  Laterality: Right;  . Carotid endarterectomy Right     CEA - 15 yrs ago  . Spine surgery    . Eye surgery    . Cataract extraction w/ intraocular lens implant Left   . Endarterectomy Left 09/28/2014    Procedure: Left Carotid Endarterectomy;  Surgeon: Sherren Mocha  Katina Dung, MD;  Location: [place];  Service: Vascular;  Laterality: Left;  . Patch angioplasty Left 09/28/2014    Procedure: WITH DACRON PATCH ANGIOPLASTY;  Surgeon: Rosetta Posner, MD;  Location: [facility] OR;  Service: Vascular;  Laterality: Left;    Family History  Problem Relation Age of Onset  . Thalassemia      Family history  . Cancer Mother 24    Bone cancer  . Diabetes Mother   . Heart disease Mother   . Heart attack Mother   . Hypertension Mother   . Stroke Father 77  . Heart disease Father   . Hypertension Father   . Hypertension Sister   . Diabetes Sister   . Heart disease Brother   . Heart attack Brother 2  . Cancer Brother     Social history:  reports that he quit smoking about 37 years ago. His smoking use included  Cigarettes. He has a 20 pack-year smoking history. He has never used smokeless tobacco. He reports that he does not drink alcohol or use illicit drugs.  Medications:  Prior to Admission medications   Medication Sig Start Date End Date Taking? Authorizing Provider  acetaminophen (TYLENOL) 325 MG tablet Take 650 mg by mouth every 6 (six) hours as needed for mild pain.    Yes Historical Provider, MD  albuterol (VENTOLIN HFA) 108 (90 BASE) MCG/ACT inhaler Inhale 2 puffs into the lungs every 4 (four) hours as needed for wheezing or shortness of breath. 01/23/14  Yes Tanda Rockers, MD  aspirin 81 MG tablet Take 81 mg by mouth daily.   Yes Historical Provider, MD  carvedilol (COREG) 12.5 MG tablet Take 1 tablet (12.5 mg total) by mouth 2 (two) times daily with a meal. 01/14/15  Yes Marijean Heath, NP  furosemide (LASIX) 20 MG tablet Take 2 tablets (40 mg total) by mouth daily. 01/14/15  Yes Marijean Heath, NP  gemfibrozil (LOPID) 600 MG tablet Take 600 mg by mouth 2 (two) times daily before a meal.   Yes Historical Provider, MD  losartan (COZAAR) 25 MG tablet Take 1 tablet (25 mg total) by mouth daily. 01/14/15  Yes Marijean Heath, NP  meclizine (ANTIVERT) 25 MG tablet Take 1 tablet (25 mg total) by mouth daily as needed for dizziness. 01/29/15  Yes Dorothy Spark, MD  NITROSTAT 0.4 MG SL tablet PLACE 1 TABLET (0.4 MG TOTAL) UNDER THE TONGUE EVERY 5 (FIVE) MINUTES AS NEEDED. FOR CHEST PAIN 01/18/15  Yes Dorothy Spark, MD  omeprazole (PRILOSEC) 20 MG capsule Take 1 capsule (20 mg total) by mouth 2 (two) times daily before a meal. 01/29/15  Yes Dorothy Spark, MD  polyethylene glycol powder (MIRALAX) powder Take 17 g by mouth daily as needed. For constipation   Yes Historical Provider, MD  pregabalin (LYRICA) 75 MG capsule Take 75 mg by mouth 2 (two) times daily.    Yes Historical Provider, MD  simvastatin (ZOCOR) 10 MG tablet Take 1 tablet (10 mg total) by mouth daily. 06/08/14  Yes  Dorothy Spark, MD  SYMBICORT 160-4.5 MCG/ACT inhaler INHALE 2 PUFFS BY MOUTH FIRST THING IN THE MORNING AND THEN TAKE 2 PUFFS 12 HOURS LATER 02/16/15  Yes Tanda Rockers, MD  vitamin C (ASCORBIC ACID) 500 MG tablet Take 500 mg by mouth daily.   Yes Historical Provider, MD      Allergies  Allergen Reactions  . Amoxicillin     REACTION: unspecified  . Clopidogrel  Bisulfate Hives    REACTION: red blue, black blotches  . Dilaudid [Hydromorphone Hcl]   . Doxycycline Hyclate   . Penicillins   . Sulfa Antibiotics   . Sulfamethoxazole     REACTION: unspecified  . Sulfonamide Derivatives     ROS:  Out of a complete 14 system review of symptoms, the patient complains only of the following symptoms, and all other reviewed systems are negative.  Hearing loss Shortness of breath, cough Urinary incontinence Easy bruising, easy bleeding Joint pain, achy muscles Numbness, weakness  Blood pressure 98/48, pulse 69, height 6' (1.829 m), weight 167 lb 12.8 oz (76.114 kg).  Physical Exam  General: The patient is alert and cooperative at the time of the examination.  Eyes: Pupils are equal, round, and reactive to light. Discs are flat bilaterally.  Neck: The neck is supple, no carotid bruits are noted on the right, present on the left.  Respiratory: The respiratory examination is clear.  Cardiovascular: The cardiovascular examination reveals a regular rate and rhythm, no obvious murmurs or rubs are noted.  Skin: Extremities are without significant edema.  Neurologic Exam  Mental status: The patient is alert and oriented x 3 at the time of the examination. The patient has apparent normal recent and remote memory, with an apparently normal attention span and concentration ability.  Cranial nerves: Facial symmetry is present. There is good sensation of the face to pinprick and soft touch bilaterally. The strength of the facial muscles and the muscles to head turning and shoulder shrug  are normal bilaterally. Speech is well enunciated, no aphasia or dysarthria is noted. Extraocular movements are full. Visual fields are full. The tongue is midline, and the patient has symmetric elevation of the soft palate. No obvious hearing deficits are noted. The patient is hard of hearing.  Motor: The motor testing reveals 5 over 5 strength of all 4 extremities, with exception of 4/5 strength of the intrinsic muscles of the hands, the patient has prominent bilateral foot drops. Good symmetric motor tone is noted throughout. Atrophy of the muscles below the knees is seen.  Sensory: Sensory testing is intact to pinprick, soft touch, vibration sensation, and position sense on the upper extremities. With the lower extremities, there is a prominent pinprick sensory deficit to the lower thighs bilaterally. The patient has markedly vibratory and position sense impairment in the feet. No evidence of extinction is noted.  Coordination: Cerebellar testing reveals good finger-nose-finger and heel-to-shin bilaterally.  Gait and station: Gait is wide-based, unsteady. The patient uses a walker for ambulation. Tandem gait was not tested. Romberg is positive.  Reflexes: Deep tendon reflexes are symmetric, but are depressed bilaterally. Toes are downgoing bilaterally.   CT head and neck 11/30/13:  IMPRESSION: 1. No acute intracranial or cervical spine abnormality observed. 2. Chronic paranasal sinusitis on the right. 3. Cervical spondylosis and degenerative disc disease.  * CT scan images were reviewed online. I agree with the written report.    Assessment/Plan:  1. Peripheral neuropathy, end-stage  2. Gait disorder  3. Cerebrovascular disease  The patient has a severe gait disorder associated with the neuropathy. I have recommended that he not operate a motor vehicle this time. The patient may be able to manage with minimal assistance in the home environment, however. The patient is getting  physical therapy currently. I have recommended a rolling walker with a seat, the patient indicates that he has a walker, and that he may need to get it repaired through the [place]  [facility]. The patient will follow-up through this office in 8 or 9 months. He currently is getting good pain control with the Lyrica, I would not stop the medication at this time. The patient does not appear to have significant issues with fluid overload at this time.    Jill Alexanders MD 03/01/2015 9:12 PM  [facility] [address] [place], [place] [address]  Phone [phone] Fax [phone]

## 2015-03-01 NOTE — Patient Instructions (Signed)

## 2015-03-02 ENCOUNTER — Other Ambulatory Visit (HOSPITAL_COMMUNITY): Payer: Medicare Other

## 2015-03-02 ENCOUNTER — Encounter (HOSPITAL_COMMUNITY): Payer: Medicare Other

## 2015-03-02 ENCOUNTER — Ambulatory Visit: Payer: Medicare Other | Admitting: Family

## 2015-03-05 ENCOUNTER — Other Ambulatory Visit: Payer: Self-pay | Admitting: Cardiology

## 2015-03-09 ENCOUNTER — Other Ambulatory Visit: Payer: Self-pay

## 2015-03-09 MED ORDER — CARVEDILOL 12.5 MG PO TABS: 12.5000 mg | ORAL_TABLET | Freq: Two times a day (BID) | ORAL | Status: DC

## 2015-03-18 ENCOUNTER — Other Ambulatory Visit: Payer: Self-pay | Admitting: Cardiology

## 2015-03-22 ENCOUNTER — Other Ambulatory Visit: Payer: Self-pay | Admitting: Cardiology

## 2015-04-08 ENCOUNTER — Other Ambulatory Visit: Payer: Self-pay | Admitting: Cardiology

## 2015-04-16 ENCOUNTER — Encounter: Payer: Self-pay | Admitting: Vascular Surgery

## 2015-04-20 ENCOUNTER — Encounter: Payer: Self-pay | Admitting: Vascular Surgery

## 2015-04-20 ENCOUNTER — Ambulatory Visit (INDEPENDENT_AMBULATORY_CARE_PROVIDER_SITE_OTHER): Payer: Medicare Other | Admitting: Vascular Surgery

## 2015-04-20 ENCOUNTER — Ambulatory Visit (HOSPITAL_COMMUNITY)
Admission: RE | Admit: 2015-04-20 | Discharge: 2015-04-20 | Disposition: A | Discharge status: Home / Self Care | Payer: Medicare Other | Source: Ambulatory Visit | Attending: Vascular Surgery | Admitting: Vascular Surgery

## 2015-04-20 VITALS — BP 132/63 | HR 62 | Temp 97.6°F | Resp 16 | Ht 72.0 in | Wt 171.0 lb

## 2015-04-20 DIAGNOSIS — I6523 Occlusion and stenosis of bilateral carotid arteries: Secondary | ICD-10-CM

## 2015-04-20 DIAGNOSIS — Z48812 Encounter for surgical aftercare following surgery on the circulatory system: Secondary | ICD-10-CM

## 2015-04-20 DIAGNOSIS — I6521 Occlusion and stenosis of right carotid artery: Secondary | ICD-10-CM | POA: Diagnosis not present

## 2015-04-20 NOTE — Patient Instructions (Signed)
Stroke Prevention Some health problems and behaviors may make it more likely for you to have a stroke. Below are ways to lessen your risk of having a stroke.   Be active for at least 30 minutes on most or all days.  Do not smoke. Try not to be around others who smoke.  Do not drink too much alcohol.  Do not have more than 2 drinks a day if you are a man.  Do not have more than 1 drink a day if you are a woman and are not pregnant.  Eat healthy foods, such as fruits and vegetables. If you were put on a specific diet, follow the diet as told.  Keep your cholesterol levels under control through diet and medicines. Look for foods that are low in saturated fat, trans fat, cholesterol, and are high in fiber.  If you have diabetes, follow all diet plans and take your medicine as told.  If you have high blood pressure (hypertension), follow all diet plans and take your medicine as told.  Keep a healthy weight. Eat foods that are low in calories, salt, saturated fat, trans fat, and cholesterol.  Do not take drugs.  Avoid birth control pills, if this applies. Talk to your doctor about the risks of taking birth control pills.  Talk to your doctor if you have sleep problems (sleep apnea).  Take all medicine as told by your doctor.  You may be told to take aspirin or blood thinner medicine. Take this medicine as told by your doctor.  Understand your medicine instructions.  Make sure any other conditions you have are being taken care of. GET HELP RIGHT AWAY IF:  You suddenly lose feeling (you feel numb) or have weakness in your face, arm, or leg.  Your face or eyelid hangs down to one side.  You suddenly feel confused.  You have trouble talking (aphasia) or understanding what people are saying.  You suddenly have trouble seeing in one or both eyes.  You suddenly have trouble walking.  You are dizzy.  You lose your balance or your movements are clumsy (uncoordinated).  You  suddenly have a very bad headache and you do not know the cause.  You have new chest pain.  Your heart feels like it is fluttering or skipping a beat (irregular heartbeat). Do not wait to see if the symptoms above go away. Get help right away. Call your local emergency services (911 in [place] Do not drive yourself to the hospital. Document Released: 01/30/2012 Document Revised: 12/15/2013 Document Reviewed: 01/31/2013 [employer] Patient Information 2015 [employer]. This information is not intended to replace advice given to you by your health care provider. Make sure you discuss any questions you have with your health care provider.  

## 2015-04-20 NOTE — Progress Notes (Signed)
Patient presents today for follow-up of carotid disease. He had a remote history of right carotid endarterectomy and underwent left carotid endarterectomy myself in February 2016. He remains quite active for his age of 31. He does have extensive bruising over his body most particularly over over his arms and his upper and lower arms. He is on 81 mg of aspirin but does not know of any other bleeding difficulty. Is concerned about the skin changes. He specifically denies any amaurosis fugax, transient ischemic attack or stroke.  Past Medical History  Diagnosis Date  . Carotid artery stenosis   . Essential hypertension   . Myocardial infarction 1979  . Coronary artery disease     Known occluded proximal LAD with collaterals, moderate RCA disease - managed medically 2009  . COPD (chronic obstructive pulmonary disease)   . History of stroke   . Prostate cancer   . Arthritis   . Foot drop, right   . Neuropathy   . Anemia   . Urinary incontinence, male, stress   . GERD (gastroesophageal reflux disease)   . Thalassemia   . Wears dentures   . Deafness in left ear     Wears hearing aide in right ear  . Sleep apnea     CPAP  . Ischemic cardiomyopathy     LVEF 40-45%  . Hypercholesteremia   . Anemia   . Osteoarthritis   . Stroke   . Heart attack   . Vertigo   . Hereditary and idiopathic peripheral neuropathy 03/01/2015  . Abnormality of gait 03/01/2015    Social History  Substance Use Topics  . Smoking status: Former Smoker -- 1.00 packs/day for 20 years    Types: Cigarettes    Quit date: 08/14/1977  . Smokeless tobacco: Never Used  . Alcohol Use: No     Comment: occasional    Family History  Problem Relation Age of Onset  . Thalassemia      Family history  . Cancer Mother 74    Bone cancer  . Diabetes Mother   . Heart disease Mother   . Heart attack Mother   . Hypertension Mother   . Stroke Father 56  . Heart disease Father   . Hypertension Father   . Hypertension Sister    . Diabetes Sister   . Heart disease Brother   . Heart attack Brother 57  . Cancer Brother     Allergies  Allergen Reactions  . Amoxicillin     REACTION: unspecified  . Clopidogrel Bisulfate Hives    REACTION: red blue, black blotches  . Dilaudid [Hydromorphone Hcl]   . Doxycycline Hyclate   . Penicillins   . Sulfa Antibiotics   . Sulfamethoxazole     REACTION: unspecified  . Sulfonamide Derivatives      Current outpatient prescriptions:  .  acetaminophen (TYLENOL) 325 MG tablet, Take 650 mg by mouth every 6 (six) hours as needed for mild pain. , Disp: , Rfl:  .  albuterol (VENTOLIN HFA) 108 (90 BASE) MCG/ACT inhaler, Inhale 2 puffs into the lungs every 4 (four) hours as needed for wheezing or shortness of breath., Disp: 1 Inhaler, Rfl: 11 .  aspirin 81 MG tablet, Take 81 mg by mouth daily., Disp: , Rfl:  .  carvedilol (COREG) 12.5 MG tablet, Take 1 tablet (12.5 mg total) by mouth 2 (two) times daily with a meal., Disp: 60 tablet, Rfl: 1 .  furosemide (LASIX) 20 MG tablet, TAKE 1 TABLET BY MOUTH EVERY DAY,  Disp: 90 tablet, Rfl: 0 .  gemfibrozil (LOPID) 600 MG tablet, Take 600 mg by mouth 2 (two) times daily before a meal., Disp: , Rfl:  .  NITROSTAT 0.4 MG SL tablet, PLACE 1 TABLET (0.4 MG TOTAL) UNDER THE TONGUE EVERY 5 (FIVE) MINUTES AS NEEDED. FOR CHEST PAIN, Disp: 25 tablet, Rfl: 3 .  omeprazole (PRILOSEC) 20 MG capsule, Take 1 capsule (20 mg total) by mouth 2 (two) times daily before a meal., Disp: 180 capsule, Rfl: 3 .  polyethylene glycol powder (MIRALAX) powder, Take 17 g by mouth daily as needed. For constipation, Disp: , Rfl:  .  pregabalin (LYRICA) 75 MG capsule, Take 75 mg by mouth 2 (two) times daily. , Disp: , Rfl:  .  simvastatin (ZOCOR) 10 MG tablet, Take 1 tablet (10 mg total) by mouth daily., Disp: 30 tablet, Rfl: 6 .  SYMBICORT 160-4.5 MCG/ACT inhaler, INHALE 2 PUFFS BY MOUTH FIRST THING IN THE MORNING AND THEN TAKE 2 PUFFS 12 HOURS LATER, Disp: 10.2 Inhaler,  Rfl: 4 .  vitamin C (ASCORBIC ACID) 500 MG tablet, Take 500 mg by mouth daily., Disp: , Rfl:  .  furosemide (LASIX) 20 MG tablet, TAKE 2 TABLETS (40 MG TOTAL) BY MOUTH DAILY. (Patient not taking: Reported on 04/20/2015), Disp: 60 tablet, Rfl: 5 .  losartan (COZAAR) 25 MG tablet, Take 1 tablet (25 mg total) by mouth daily. (Patient not taking: Reported on 04/20/2015), Disp: 30 tablet, Rfl: 0 .  meclizine (ANTIVERT) 25 MG tablet, Take 1 tablet (25 mg total) by mouth daily as needed for dizziness. (Patient not taking: Reported on 04/20/2015), Disp: 30 tablet, Rfl: 6  Filed Vitals:   04/20/15 1007 04/20/15 1008  BP: 137/57 132/63  Pulse: 66 62  Temp: 97.6 F (36.4 C)   Resp: 16   Height: 6' (1.829 m)   Weight: 171 lb (77.565 kg)   SpO2: 94%     Body mass index is 23.19 kg/(m^2).       Physical exam well-developed well-nourished thin gentleman in no acute distress Grossly intact neurologically Bilateral neck incision is well-healed with no bruits bilaterally Extensive bruising over both upper extremities.  He underwent a carotid duplex today for reviewed this and discussed with the patient. This shows widely patent endarterectomy site on the left with no evidence of stenosis on the right carotid system as well. Does have some stenosis in his right external carotid artery.  Impression and plan stable status post bilateral carotid endarterectomies. Will resume full activity. Plan see him again in 6 months with repeat carotid duplex. He will discuss his bruising with Dr. Harrington Challenger to determine if further evaluation is warranted

## 2015-04-27 ENCOUNTER — Telehealth: Payer: Self-pay

## 2015-04-27 NOTE — Telephone Encounter (Signed)
Prior auth for Omeprazole 20mg  bid sent to [facility]

## 2015-05-05 ENCOUNTER — Ambulatory Visit: Payer: Medicare Other | Admitting: Cardiology

## 2015-05-09 ENCOUNTER — Other Ambulatory Visit: Payer: Self-pay | Admitting: Cardiology

## 2015-05-19 ENCOUNTER — Ambulatory Visit: Payer: Medicare Other | Admitting: Cardiology

## 2015-05-20 ENCOUNTER — Telehealth: Payer: Self-pay

## 2015-05-20 NOTE — Telephone Encounter (Signed)
His Insurance won't pay pay for Omeprazole 20mg  bid. Can't get a quantity override. Any reason he can't take 40mg  daily? i talked to him to let him know I was asking you.

## 2015-05-20 NOTE — Telephone Encounter (Signed)
He can take 40 mg po daily If insurance doesn't pay it he can buy it OTC

## 2015-05-21 ENCOUNTER — Other Ambulatory Visit: Payer: Self-pay

## 2015-05-21 MED ORDER — OMEPRAZOLE 40 MG PO CPDR: 40.0000 mg | DELAYED_RELEASE_CAPSULE | Freq: Every day | ORAL | Status: DC

## 2015-05-21 NOTE — Telephone Encounter (Signed)
Sent to [facility].  

## 2015-05-24 ENCOUNTER — Encounter: Payer: Self-pay | Admitting: Cardiology

## 2015-05-24 ENCOUNTER — Ambulatory Visit (INDEPENDENT_AMBULATORY_CARE_PROVIDER_SITE_OTHER): Payer: Medicare Other | Admitting: Cardiology

## 2015-05-24 VITALS — BP 112/62 | HR 68 | Ht 72.0 in | Wt 171.0 lb

## 2015-05-24 DIAGNOSIS — R9439 Abnormal result of other cardiovascular function study: Secondary | ICD-10-CM

## 2015-05-24 DIAGNOSIS — I5042 Chronic combined systolic (congestive) and diastolic (congestive) heart failure: Secondary | ICD-10-CM

## 2015-05-24 DIAGNOSIS — I2510 Atherosclerotic heart disease of native coronary artery without angina pectoris: Secondary | ICD-10-CM | POA: Diagnosis not present

## 2015-05-24 DIAGNOSIS — I10 Essential (primary) hypertension: Secondary | ICD-10-CM

## 2015-05-24 DIAGNOSIS — I2583 Coronary atherosclerosis due to lipid rich plaque: Secondary | ICD-10-CM

## 2015-05-24 NOTE — Patient Instructions (Signed)
Medication Instructions:  Your physician recommends that you continue on your current medications as directed. Please refer to the Current Medication list given to you today.   Labwork: None ordered  Testing/Procedures: None ordered  Follow-Up: Your physician wants you to follow-up in:  6 months You will receive a reminder letter in the mail two months in advance. If you don't receive a letter, please call our office to schedule the follow-up appointment.   Any Other Special Instructions Will Be Listed Below (If Applicable).

## 2015-05-24 NOTE — Progress Notes (Signed)
Patient ID: JDEN WANT, male   DOB: [birth year], 81 y.o.   MRN: [id number]     CARDIOLOGY OFFICE NOTE  Date:  05/24/2015    Celene Kras Date of Birth: Jan 17, 1924 Medical Record #[id number]  PCP:   Melinda Crutch, MD  Cardiologist:  Meda Coffee    No chief complaint on file.   History of Present Illness: [name] is a 81 y.o. very delightful male, [place] [place] veteran who presents today for a work in visit today. Seen for Dr. Meda Coffee. He has coronary artery disease with remote MI in 1979 - last cath from 2010 - has been managed medically, chronic systolic/diastolic heart failure, and carotid artery disease.  Back in June he was admitted with acute respiratory failure and diagnosed with pneumonia. He was also treated for acute on chronic systolic CHF,and had mild troponin elevation that was believed to be demand ischemia. Saw Dr. Meda Coffee 2 weeks after discharge - had improved but still with DOE. She arranged for Myoview testing - see below.   05/24/15 - cmes in today. Here with his daughter. Very hard of hearing but able to understand. He says he is doing ok - getting stronger. Has around the clock the care since his admission last month. He does not have chest pain.  He denies CP< has some mild baseline DOE and chronic LE edema. He gets mild orthostatic hypotension, but no falls.    Past Medical History  Diagnosis Date  . Carotid artery stenosis   . Essential hypertension   . Myocardial infarction ([place]) 1979  . Coronary artery disease     Known occluded proximal LAD with collaterals, moderate RCA disease - managed medically 2009  . COPD (chronic obstructive pulmonary disease) ([place])   . History of stroke   . Prostate cancer ([place])   . Arthritis   . Foot drop, right   . Neuropathy ([place])   . Anemia   . Urinary incontinence, male, stress   . GERD (gastroesophageal reflux disease)   . Thalassemia   . Wears dentures   . Deafness in left ear     Wears hearing aide in right ear  . Sleep apnea     CPAP    . Ischemic cardiomyopathy     LVEF 40-45%  . Hypercholesteremia   . Anemia   . Osteoarthritis   . Stroke ([place])   . Heart attack ([place])   . Vertigo   . Hereditary and idiopathic peripheral neuropathy 03/01/2015  . Abnormality of gait 03/01/2015    Past Surgical History  Procedure Laterality Date  . Penile prosthesis implant    . Cholecystectomy    . Right carotid endarterectomy    . Back surgery    . Lumbar laminectomy  03/24/10    L3-4 and L4-5  . Ankle surgery      Bone infection  . Cardiac catheterization    . Appendectomy    . Joint replacement Left   . Radioactive seed implant  2009  . Pars plana vitrectomy  03/19/2012    Procedure: PARS PLANA VITRECTOMY WITH 25 GAUGE;  Surgeon: Hayden Pedro, MD;  Location: [facility];  Service: Ophthalmology;  Laterality: Right;  . Carotid endarterectomy Right     CEA - 15 yrs ago  . Spine surgery    . Eye surgery    . Cataract extraction w/ intraocular lens implant Left   . Endarterectomy Left 09/28/2014    Procedure: Left Carotid Endarterectomy;  Surgeon: Rosetta Posner, MD;  Location: [facility] OR;  Service: Vascular;  Laterality: Left;  . Patch angioplasty Left 09/28/2014    Procedure: WITH DACRON PATCH ANGIOPLASTY;  Surgeon: Rosetta Posner, MD;  Location: [facility] OR;  Service: Vascular;  Laterality: Left;     Medications: Current Outpatient Prescriptions  Medication Sig Dispense Refill  . acetaminophen (TYLENOL) 325 MG tablet Take 650 mg by mouth every 6 (six) hours as needed for mild pain.     Marland Kitchen albuterol (VENTOLIN HFA) 108 (90 BASE) MCG/ACT inhaler Inhale 2 puffs into the lungs every 4 (four) hours as needed for wheezing or shortness of breath. 1 Inhaler 11  . aspirin 81 MG tablet Take 81 mg by mouth daily.    . furosemide (LASIX) 20 MG tablet TAKE 1 TABLET BY MOUTH EVERY DAY 90 tablet 0  . gemfibrozil (LOPID) 600 MG tablet Take 600 mg by mouth 2 (two) times daily before a meal.    . losartan (COZAAR) 25 MG tablet Take 1 tablet (25 mg total) by  mouth daily. 30 tablet 0  . meclizine (ANTIVERT) 25 MG tablet Take 1 tablet (25 mg total) by mouth daily as needed for dizziness. 30 tablet 6  . NITROSTAT 0.4 MG SL tablet PLACE 1 TABLET (0.4 MG TOTAL) UNDER THE TONGUE EVERY 5 (FIVE) MINUTES AS NEEDED. FOR CHEST PAIN 25 tablet 3  . omeprazole (PRILOSEC) 40 MG capsule Take 1 capsule (40 mg total) by mouth daily. 90 capsule 3  . polyethylene glycol powder (MIRALAX) powder Take 17 g by mouth daily as needed. For constipation    . pregabalin (LYRICA) 75 MG capsule Take 75 mg by mouth 2 (two) times daily.     . simvastatin (ZOCOR) 10 MG tablet Take 1 tablet (10 mg total) by mouth daily. 30 tablet 6  . SYMBICORT 160-4.5 MCG/ACT inhaler INHALE 2 PUFFS BY MOUTH FIRST THING IN THE MORNING AND THEN TAKE 2 PUFFS 12 HOURS LATER 10.2 Inhaler 4  . vitamin C (ASCORBIC ACID) 500 MG tablet Take 500 mg by mouth daily.    . carvedilol (COREG) 12.5 MG tablet TAKE 1 TABLET (12.5 MG TOTAL) BY MOUTH 2 (TWO) TIMES DAILY WITH A MEAL. 60 tablet 0   No current facility-administered medications for this visit.    Allergies: Allergies  Allergen Reactions  . Amoxicillin     REACTION: unspecified  . Clopidogrel Bisulfate Hives    REACTION: red blue, black blotches  . Dilaudid [Hydromorphone Hcl]   . Doxycycline Hyclate   . Penicillins   . Sulfa Antibiotics   . Sulfamethoxazole     REACTION: unspecified  . Sulfonamide Derivatives     Social History: The patient  reports that he quit smoking about 37 years ago. His smoking use included Cigarettes. He has a 20 pack-year smoking history. He has never used smokeless tobacco. He reports that he does not drink alcohol or use illicit drugs.   Family History: The patient's family history includes Cancer in his brother; Cancer (age of onset: 76) in his mother; Diabetes in his mother and sister; Heart attack in his mother; Heart attack (age of onset: 28) in his brother; Heart disease in his brother, father, and mother;  Hypertension in his father, mother, and sister; Stroke (age of onset: 55) in his father; Thalassemia in an other family member.   Review of Systems: Please see the history of present illness.   Otherwise, the review of systems is positive for leg swelling, cough, DOE, dizziness, wheezing and easy bruising.   All  other systems are reviewed and negative.   Physical Exam: VS:  BP 112/62 mmHg  Pulse 68  Ht 6' (1.829 m)  Wt 171 lb (77.565 kg)  BMI 23.19 kg/m2 .  BMI Body mass index is 23.19 kg/(m^2).  Wt Readings from Last 3 Encounters:  05/24/15 171 lb (77.565 kg)  04/20/15 171 lb (77.565 kg)  03/01/15 167 lb 12.8 oz (76.114 kg)    General: Pleasant. He is elderly, looks chronically ill but in no acute distress. Bruised. Using a walker.   HEENT: Normal. Neck: Supple, no JVD, carotid bruits, or masses noted.  Cardiac: Regular rate and rhythm. No edema.  Respiratory:  Lungs are clear to auscultation bilaterally with normal work of breathing.  GI: Soft and nontender.  MS: No deformity or atrophy. Gait and ROM intact.Has braces on both lower legs. Using a walker.  Skin: Warm and dry. Color is normal.  Neuro:  Strength and sensation are intact and no gross focal deficits noted.  Psych: Alert, appropriate and with normal affect.   LABORATORY DATA:  EKG:  EKG is not ordered today.  Lab Results  Component Value Date   WBC 8.8 01/11/2015   HGB 10.2* 01/11/2015   HCT 32.3* 01/11/2015   PLT 150 01/11/2015   GLUCOSE 81 01/21/2015   CHOL 218* 11/17/2013   TRIG 116.0 11/17/2013   HDL 42.60 11/17/2013   LDLCALC 152* 11/17/2013   ALT 25 01/08/2015   AST 45* 01/08/2015   NA 138 01/21/2015   K 5.3* 01/21/2015   CL 102 01/21/2015   CREATININE 1.36 01/21/2015   BUN 36* 01/21/2015   CO2 33* 01/21/2015   TSH 1.718 04/30/2011   INR 1.30 01/08/2015   HGBA1C 6.6* 05/04/2011   Other Studies Reviewed Today:  Echocardiogram 12/18/2008  Left ventricle: Very poor acoustic window limits  study. LVEF is depressed at approximately 25 to30% with akinesis the the inferior wall, inferoseptal wall, apex, distal lateral, mid/distal anterior; hypokinesis/akinesis inferiorly. The cavity size was normal. Wall thickness was normal. 2. Mitral valve: Mildly to moderately calcified annulus. Mildly thickened leaflets . Mild regurgitation.  Echocardiogram 01/09/2015  Left ventricle: The cavity size was normal. Wall thickness was increased in a pattern of mild LVH. Systolic function was mildly to moderately reduced. The estimated ejection fraction was in the range of 40% to 45%. There is akinesis of the mid-apicalanteroseptal and apical myocardium. There is akinesis of the apicalinferior myocardium. Doppler parameters are consistent with abnormal left ventricular relaxation (grade 1 diastolic dysfunction). Doppler parameters are consistent with high ventricular filling pressure. - Aortic valve: Mildly calcified annulus. Trileaflet. - Mitral valve: Calcified annulus. Mildly calcified leaflets . - Left atrium: The atrium was mildly dilated. - Right atrium: Central venous pressure (est): 3 mm Hg. - Atrial septum: No defect or patent foramen ovale was identified. - Tricuspid valve: There was trivial regurgitation. - Pulmonary arteries: Systolic pressure could not be accurately estimated. - Pericardium, extracardiac: There was no pericardial effusion.  Impressions:  - Normal LV wall thickness with LVEF approximately 40-45%. Mid to apical anteroseptal and inferoapical akinesis suggests ischemic cardiomyopathy. Definity contrast shows swirling and slow flow at the apex, but no definitive mural thrombus. Grade 1 diastolic dysfunction with increased filling pressures. Mild left atrial enlargement. Trivial tricuspid regurgitation, unable to assess PASP.  ECG - sinus rhythm, new inferolateral negative T waves.  Myoview Interpretation Summary from 02/2015     Nuclear stress EF: 34%.  There was no ST segment deviation noted during stress.  This  is a high risk study.  The left ventricular ejection fraction is moderately decreased (30-44%).  High risk stress nuclear study with a large, severe, predominantly fixed defect in the anteroseptal, apical and inferior walls (mild reversibility in the inferior lateral wall); findings consistent with large prior infarct and mild peri-infarct ischemia in the inferior lateral wall; EF 34 with akinesis of the anteroseptal, apical and inferior walls. Study high risk due to reduced LV function.    ANGIOGRAPHIC FINDINGS: 1. The left main coronary artery has mild 20% plaque. 2. The left anterior descending is known to have a chronic total  occlusion in the midportion of the vessel just beyond the first  moderate-to-large size diagonal branch. This appears unchanged  from the prior cath in July 2009. The first diagonal is a moderate-  sized vessel that has a 30% plaque noted diffusely throughout the  vessel. The distal left anterior descending coronary artery fills  faintly from left-to-left collaterals. 3. The circumflex artery primarily consists of a large marginal  branch. There is diffuse 20-30% disease throughout this vessel.  There are no severely obstructive lesions noted. 4. The right coronary artery is a large dominant vessel that has a  proximal 60-70% stenosis that appears unchanged from prior cath.  The midportion of the vessel has serial 40-50% stenoses. The  distal portion of the vessel has a calcific 60-70% stenosis that  appears unchanged from prior cath. The posterior descending artery  posterolateral branch has mild plaque disease only.  HEMODYNAMIC DATA: Central aortic pressure 131/63. Left ventricular pressure 116/14. Left ventricular end-diastolic pressure 30.  No left ventricular angiogram was  performed.  IMPRESSION: 1. Double-vessel coronary artery disease that appears stable and  unchanged from cath in July 2009. 2. Acute pulmonary edema that is likely secondary to diastolic  dysfunction. The patient's blood pressure was elevated to 749  systolically when he arrived in Cath Lab.  RECOMMENDATIONS: The patient will be admitted to the CCU and monitored closely. We will continue diuresis with IV Lasix. Of note, the patient was given 40 mg of IV Lasix upon arrival to the Cath Lab and noted almost immediate improvement in his shortness of breath when he began to diurese. We will check an echocardiogram and will also check an x-ray. Laboratory values will include a D-dimer, cardiac enzymes, coagulation profile, metabolic profile, CBC and a BNP. Further planning pending the patient's response to intravenous Lasix.  Lauree Chandler, MD    Assessment & Plan  81 y.o.male with PMH of HTN, CAD, COPD, ICM, and chronic combined HF, admitted in May 2016 with acute on chronic hypercarbic respiratory failure requiring temporary ventilatory support.   1. Chronic combined systolic and diastolic HF - he looks compensated today. His symptoms are unchanged. His shortness of breath is no different and is at his baseline.   2.Mildly elevated trop at the time of hospital visit for acute CHF - Likely demand, however new inferolateral ST depressions and negative T waves - abnormal Myoview showing a large, severe, predominantly fixed defect in the anteroseptal, apical and inferior walls (mild reversibility in the inferior lateral wall); findings consistent with large prior infarct and mild peri-infarct ischemia in the inferior lateral wall; EF 34 with akinesis of the anteroseptal, apical and inferior walls. Study high risk due to reduced LV function. Considering his age, CKD stage 3, lack of symptoms and only mild ischemia on stress test I have decided to continue  with medical management. The patient agress.  3. CAD, known occluded  proximal LAD with collaterals and moderate RCA disease 2009 - Continue ASA, statin, BB and ARB, Lexiscan as above  - He has opted for continued medical management.   4. CKD  5. Orthostatic hypotension - if balance worsens we will consider to decrease dosages of BP meds, for now I will continue the same as the diary from home shows frequent 980-221 systolic.  Follow up in 6 months.  Dorothy Spark

## 2015-06-04 ENCOUNTER — Other Ambulatory Visit: Payer: Self-pay | Admitting: Cardiology

## 2015-06-08 ENCOUNTER — Other Ambulatory Visit: Payer: Self-pay | Admitting: Cardiology

## 2015-06-14 ENCOUNTER — Telehealth: Payer: Self-pay

## 2015-06-14 NOTE — Telephone Encounter (Signed)
Patient stated to pharmacy that their Furosemide 20 mg has changed to 2 daily. I saw that his med list on 02-24-15 shows 2 daily, on 05-24-15 his med list shows 1 a day.  Please advise.

## 2015-06-15 NOTE — Telephone Encounter (Signed)
Dr Meda Coffee would like for the pt to continue taking Lasix 20 mg po daily, as noted on last OV with her in October.  Can you please refill accordingly and let the pt know this.

## 2015-06-15 NOTE — Telephone Encounter (Signed)
Pt's medication has been sent to the pharmacy and pt was informed . Verbalized understanding.

## 2015-06-15 NOTE — Telephone Encounter (Signed)
It is not clear to me from this message how much is he using right now. He should continue taking what he was taking when he saw me the last time, because he was well compensated on that dose.

## 2015-06-21 ENCOUNTER — Other Ambulatory Visit: Payer: Self-pay | Admitting: Cardiology

## 2015-07-10 ENCOUNTER — Other Ambulatory Visit: Payer: Self-pay | Admitting: Internal Medicine

## 2015-07-19 ENCOUNTER — Encounter: Payer: Self-pay | Admitting: Internal Medicine

## 2015-07-19 ENCOUNTER — Ambulatory Visit (INDEPENDENT_AMBULATORY_CARE_PROVIDER_SITE_OTHER): Payer: Medicare Other | Admitting: Internal Medicine

## 2015-07-19 VITALS — BP 118/58 | HR 64 | Ht 72.0 in | Wt 171.2 lb

## 2015-07-19 DIAGNOSIS — Z23 Encounter for immunization: Secondary | ICD-10-CM | POA: Diagnosis not present

## 2015-07-19 DIAGNOSIS — J449 Chronic obstructive pulmonary disease, unspecified: Secondary | ICD-10-CM | POA: Diagnosis not present

## 2015-07-19 MED ORDER — GLYCOPYRROLATE-FORMOTEROL 9-4.8 MCG/ACT IN AERO: 2.0000 | INHALATION_SPRAY | Freq: Two times a day (BID) | RESPIRATORY_TRACT | Status: DC

## 2015-07-19 NOTE — Patient Instructions (Signed)
Try bevespi 2 puffs every 12 hours to see if you like it better than symbicort and if so call for prescription  Please schedule a follow up visit in 3 months but call sooner if needed

## 2015-07-19 NOTE — Progress Notes (Signed)
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 Pulmonary Consult    Referred per Dr. Kathyrn Lass. Pt c/o cough and SOB for the past 10 days. He states cough is prod with large amounts of yellow sputum. He gets SOB walking to the mailbox and back. He uses ventolin for rescue approx 4 times per day.   cough bad esp in am rx pred and abx no better so far On symbicort 160 2bid and still feels need for saba qid rec Add spiriva 2 puffs each am Continue symbicort Take 2 puffs first thing in am and then another 2 puffs about 12 hours later.  Prednisone 10 mg take  4 each am x 2 days,   2 each am x 2 days,  1 each am x 2 days and stop  Levaquin x 7 days 500 mg one daily  For cough mucinex dm 1200 mg every 12 hours and try prilosec 20mg   Take 30-60 min before first meal of the day and Pepcid 20 mg one bedtime  GERD diet .  microcytic anemia detected  > pt reports has thalassemia and Dr Harrington Challenger aware   11/13/2013 f/u ov/Karelly Dewalt re: GOLD III COPD  Chief Complaint  Patient presents with  . Follow-up    SOB and cough are improving. No new co's today. Using rescue inhaler approx 3 times per day.     Sill using fish oil Cough better still some nasty mucus but much less and does not disturb sleep Doe x mailbox is 200 yards slt uphill back and sometimes stop half way  >>d/c fish oil   12/05/13 Follow up and Med Calendar  Returns for follow up and med review  We reviewed all her meds and organized them into a med calendar with pt education .   Denies any breathing  complaints at this time.   Feels cough is better.  Remains on symbicort and spiriva .  rec Follow med calendar and bring to each visit.  Continue on current regimen   01/23/2014 f/u ov/Husna Krone re: just on symbicort and not rinsing/ no med calendar/ no saba need  Chief Complaint  Patient presents with  . Follow-up    Pt states that his cough has resolved, but c/o hoarseness for the past month.  He states that his breathing is back to his normal baseline.   some bruising on asa. Not limited by breathing from desired activities  rec Continue symbicort 160 Take 2 puffs first thing in am and then another 2 puffs about 12 hours later.  Rinse and gargle after use Only use your albuterol as a rescue medication    Admit date: 01/08/2015 Discharge date: 01/14/2015  Discharge Diagnoses:    Acute respiratory failure  Pneumonia  E-coli UTI  Acute pulmonary edema  Brief Summary:   presented 5/27 with 2 day hx cough, acute onset dyspnea which prompted EMS call. On arrival to ER had marked dyspnea, accessory muscle use despite solumedrol, BD, bipap. He was ultimately  intubated in ER and PCCM called to admit with acute on chronic hypercarbic respiratory failure in setting decompensated dCHF and ?CAP. He was treated with mechanical ventilatory support, aggressive diuresis, empiric IV antibiotics, BD's. He was followed closely by cardiology as well. He improved quickly and was extubated 5/28. Course was complicated by mild acute on chronic renal failure in setting lasix, probable gout flare and EColi UTI. Lasix was held for several days r/t increased creatinine which is now back to baseline. He was treated with empiric abx for ?CAP (doubt) and ?UTI. He was seen in consultation by physical therapy who recommend home health PT which has been arranged. At the time of discharge pt is drastically improved, near baseline and ambulating without difficulty on RA.   STUDIES:  ECHO 5/27>>>EF 40-45%, Gr 1 DD  , Akinesis apical /inferior  (Prev echo 25-30%)  LINES/TUBES:  OETT 5/27> 5/28   MICRO/ ABX:  MRSA PCR neg 5/27 TA>> few MSSA 5/27 Bld x2>> neg  5/27 Urine>> 100k E.coli (S- amp, ancef, rocephin, gent, nitro, zosyn; R-levaquin, cipro, bactrim)  levaquin 5/27>>>6/2 azactam 5/27>>>5/30 Keflex 6/2>>6/5   Discharge Plan by Discharge Diagnosis  Acute on chronic hypercarbic respiratory failure  Pulmonary edema  ?CAP - more likely heart failure but completed course abx given few staph in sputum culture as above  COPD  D/c plan --  Continue symbicort, PRN albuterol  outpt pulm f/u  Continue lasix 40mg  daily with outpt cards f/u as below  F/u CXR  PPI for GERD  S/p 7 day course abx for ?CAP  Decompensated acute on chronic diastolic HF H/o HTN Pulmonary edema  D/c plan --  Lasix 40mg  daily  D/c on coreg, losartan as below per cardiology recs  outpt cardiology f/u  BMET in 1 week - arranged    Mild acute on chronic renal failure (looks like baseline cr ~1.2-1.3) Minimal lactic acidosis  Hypokalemia >resolved  Hypophos >resolved D/c plan --  PCP f/u  F/u chemistry 1 week  Continue lasix as above   ?UTI - asymptomatic. EColi on culture. Resistant to levaquin although pt improved s/p course levaquin D/c plan --  Will d/c on short course cephalexin given levaquin resistance on culture  PCP f/u   Suspected gout - much improved when lyrica resumed. Not much improvement with colchicine.  D/c plan --  PCP management  Will hold colchicine at d/c given minimal improvement (although only on it x 2 days) with borderline renal function and need for diuresis     01/29/2015 extended f/u ov/Pascual Mantel re: post hosp/ resp failure resolved ? All chf ? / GOLDI III copd/ not using med calendar provided Chief Complaint  Patient presents with  . HFU    Pt states his breathing has improved but not back to  normal baseline. He still has some cough- prod with clear sputum.   cough worse  first thing in am/ nothing purulent and does not wake him prematurely/ really Not really limited by breathing from desired activities but rather by fatigue. Not using saba  / has help at home but no clear "master MAR" they follow and only using symb one bid with suboptimal hfa (see a/p) Pt very confused with details of care and care taker not clear either "I don't give any meds on my shift"  Q what about prns A I don't know.  rec Continue symbicort 160 Take 2 puffs first thing in am and then another 2 puffs about 12 hours later.  Rinse and  gargle after use Work on inhaler technique:      07/19/2015  f/u ov/Kvion Shapley re: GOLD III / osa but no 02  Chief Complaint  Patient presents with  . Follow-up    Pt c/o occasional cough/wheeze and increasing DOE. Pt denies CP/tightness.    sleeping on cpap/ no 02  On symbicort 160 2bid and prn saba not using  Mailbox and back wears him out if does it more it twice    No obvious day to day or daytime variabilty or assoc  excess/ purulent sputum or mucus plugs   cp or chest tightness,  overt sinus or hb symptoms. No unusual exp hx or h/o childhood pna/ asthma or knowledge of premature birth.  Sleeping ok without nocturnal  or early am exacerbation  of respiratory  c/o's or need for noct saba. Also denies any obvious fluctuation of symptoms with weather or environmental changes or other aggravating or alleviating factors except as outlined above   Current Medications, Allergies, Complete Past Medical History, Past Surgical History, Family History, and Social History were reviewed in [employer] record.  ROS  The following are not active complaints unless bolded sore throat, dysphagia, dental problems, itching, sneezing,  nasal congestion or excess/ purulent secretions, ear ache,   fever, chills, sweats, unintended wt loss, pleuritic or exertional cp, hemoptysis,   orthopnea pnd or leg swelling, presyncope, palpitations, heartburn, abdominal pain, anorexia, nausea, vomiting, diarrhea  or change in bowel or urinary habits, change in stools or urine, dysuria,hematuria,  rash, arthralgias, visual complaints, headache, numbness weakness or ataxia or problems with walking or coordination,  change in mood/affect or memory.           Objective:   Physical Exam  11/13/2013    176>176 12/05/13 > 01/23/2014  180 >  01/29/2015 166 > 07/19/2015 171  Vital signs reviewed/ note low bp but no orthostatic symptoms    HEENT: nl dentition, turbinates, and oropharynx. Nl external ear canals without cough reflex   NECK :  without JVD/Nodes/TM/ nl carotid upstrokes bilaterally   LUNGS: no acc muscle use,  Nl contour chest which is clear to A and P bilaterally without cough on insp or exp maneuvers   CV:  RRR  no s3 or murmur or increase in P2,  1+ pitting R> L pedal edema   ABD:  soft and nontender with nl inspiratory excursion in the supine position. No bruits or organomegaly, bowel sounds nl  MS:  Nl gait/ ext warm without deformities, calf tenderness, cyanosis or clubbing No obvious joint restrictions   SKIN: warm and dry without lesions    NEURO:  alert, approp, nl sensorium with  no motor deficits        CXR PA and Lateral:   01/29/2015 :     I personally reviewed images and agree with radiology impression as follows:    Lungs are hyperexpanded. Lungs are clear except for minimal subsegmental atelectasis in the left base currently. There has been interval clearing of atelectasis from both lung bases compared to recent prior study. No new opacity present currently.      Assessment & Plan:

## 2015-07-23 ENCOUNTER — Other Ambulatory Visit: Payer: Self-pay | Admitting: Internal Medicine

## 2015-07-25 ENCOUNTER — Encounter: Payer: Self-pay | Admitting: Internal Medicine

## 2015-07-25 NOTE — Assessment & Plan Note (Addendum)
-   spirometry 11/13/2013 >  FEV1  1.46 (45%) ratio 52 - 11/13/2013  Walked RA x 3 laps @ 185 ft each stopped due to  End of study no desats  -12/05/13 Med calendar created > not using 01/23/2014 or 01/29/15  - 07/19/2015  p extensive coaching HFA effectiveness =    90% from a baseline of 50%  > try bivespi - 07/19/2015  Walked RA x 2 laps @ 185 ft each stopped due to  Legs tired > sob/ no desat    Good candidate for laba/lama and since familiar with hfa symbicort reasonable to try bivespi 2 bid  - only caveat is if don't note improvement in doe or start having aecopd then not the best choice and should call us.  I had an extended discussion with the patient reviewing all relevant studies completed to date and  lasting 15 to 20 minutes of a 25 minute visit    Each maintenance medication was reviewed in detail including most importantly the difference between maintenance and prns and under what circumstances the prns are to be triggered using an action plan format that is not reflected in the computer generated alphabetically organized AVS.    Please see instructions for details which were reviewed in writing and the patient given a copy highlighting the part that I personally wrote and discussed at today's ov.

## 2015-09-20 ENCOUNTER — Emergency Department (HOSPITAL_COMMUNITY): Payer: Medicare Other

## 2015-09-20 ENCOUNTER — Encounter (HOSPITAL_COMMUNITY): Payer: Self-pay | Admitting: Emergency Medicine

## 2015-09-20 ENCOUNTER — Inpatient Hospital Stay (HOSPITAL_COMMUNITY)
Admission: EM | Admit: 2015-09-20 | Discharge: 2015-09-23 | DRG: 683 | Disposition: A | Discharge status: Home Health | Payer: Medicare Other | Attending: Internal Medicine | Admitting: Internal Medicine

## 2015-09-20 ENCOUNTER — Observation Stay (HOSPITAL_COMMUNITY): Payer: Medicare Other

## 2015-09-20 DIAGNOSIS — I2510 Atherosclerotic heart disease of native coronary artery without angina pectoris: Secondary | ICD-10-CM | POA: Diagnosis present

## 2015-09-20 DIAGNOSIS — I5032 Chronic diastolic (congestive) heart failure: Secondary | ICD-10-CM | POA: Diagnosis present

## 2015-09-20 DIAGNOSIS — Z8673 Personal history of transient ischemic attack (TIA), and cerebral infarction without residual deficits: Secondary | ICD-10-CM

## 2015-09-20 DIAGNOSIS — N179 Acute kidney failure, unspecified: Secondary | ICD-10-CM | POA: Diagnosis not present

## 2015-09-20 DIAGNOSIS — Z808 Family history of malignant neoplasm of other organs or systems: Secondary | ICD-10-CM

## 2015-09-20 DIAGNOSIS — I6529 Occlusion and stenosis of unspecified carotid artery: Secondary | ICD-10-CM | POA: Diagnosis present

## 2015-09-20 DIAGNOSIS — D696 Thrombocytopenia, unspecified: Secondary | ICD-10-CM | POA: Diagnosis present

## 2015-09-20 DIAGNOSIS — D569 Thalassemia, unspecified: Secondary | ICD-10-CM | POA: Diagnosis present

## 2015-09-20 DIAGNOSIS — J449 Chronic obstructive pulmonary disease, unspecified: Secondary | ICD-10-CM | POA: Diagnosis not present

## 2015-09-20 DIAGNOSIS — Z823 Family history of stroke: Secondary | ICD-10-CM

## 2015-09-20 DIAGNOSIS — N183 Chronic kidney disease, stage 3 (moderate): Secondary | ICD-10-CM | POA: Diagnosis present

## 2015-09-20 DIAGNOSIS — N189 Chronic kidney disease, unspecified: Secondary | ICD-10-CM

## 2015-09-20 DIAGNOSIS — Z79899 Other long term (current) drug therapy: Secondary | ICD-10-CM

## 2015-09-20 DIAGNOSIS — I5042 Chronic combined systolic (congestive) and diastolic (congestive) heart failure: Secondary | ICD-10-CM | POA: Diagnosis present

## 2015-09-20 DIAGNOSIS — Z833 Family history of diabetes mellitus: Secondary | ICD-10-CM

## 2015-09-20 DIAGNOSIS — R55 Syncope and collapse: Secondary | ICD-10-CM | POA: Diagnosis present

## 2015-09-20 DIAGNOSIS — Z87891 Personal history of nicotine dependence: Secondary | ICD-10-CM

## 2015-09-20 DIAGNOSIS — Z8546 Personal history of malignant neoplasm of prostate: Secondary | ICD-10-CM

## 2015-09-20 DIAGNOSIS — I252 Old myocardial infarction: Secondary | ICD-10-CM

## 2015-09-20 DIAGNOSIS — I255 Ischemic cardiomyopathy: Secondary | ICD-10-CM | POA: Diagnosis present

## 2015-09-20 DIAGNOSIS — I10 Essential (primary) hypertension: Secondary | ICD-10-CM | POA: Diagnosis present

## 2015-09-20 DIAGNOSIS — I130 Hypertensive heart and chronic kidney disease with heart failure and stage 1 through stage 4 chronic kidney disease, or unspecified chronic kidney disease: Secondary | ICD-10-CM | POA: Diagnosis present

## 2015-09-20 DIAGNOSIS — K219 Gastro-esophageal reflux disease without esophagitis: Secondary | ICD-10-CM | POA: Diagnosis present

## 2015-09-20 DIAGNOSIS — E7800 Pure hypercholesterolemia, unspecified: Secondary | ICD-10-CM | POA: Diagnosis present

## 2015-09-20 DIAGNOSIS — E860 Dehydration: Secondary | ICD-10-CM | POA: Diagnosis present

## 2015-09-20 DIAGNOSIS — D509 Iron deficiency anemia, unspecified: Secondary | ICD-10-CM | POA: Diagnosis present

## 2015-09-20 DIAGNOSIS — H9192 Unspecified hearing loss, left ear: Secondary | ICD-10-CM | POA: Diagnosis present

## 2015-09-20 DIAGNOSIS — Z8249 Family history of ischemic heart disease and other diseases of the circulatory system: Secondary | ICD-10-CM

## 2015-09-20 LAB — COMPREHENSIVE METABOLIC PANEL
ALBUMIN: 3.5 g/dL (ref 3.5–5.0)
ALT: 10 U/L — ABNORMAL LOW (ref 17–63)
ANION GAP: 17 — AB (ref 5–15)
AST: 17 U/L (ref 15–41)
Alkaline Phosphatase: 85 U/L (ref 38–126)
BILIRUBIN TOTAL: 0.8 mg/dL (ref 0.3–1.2)
BUN: 60 mg/dL — ABNORMAL HIGH (ref 6–20)
CALCIUM: 8.8 mg/dL — AB (ref 8.9–10.3)
CO2: 24 mmol/L (ref 22–32)
Chloride: 94 mmol/L — ABNORMAL LOW (ref 101–111)
Creatinine, Ser: 2.95 mg/dL — ABNORMAL HIGH (ref 0.61–1.24)
GFR calc non Af Amer: 17 mL/min — ABNORMAL LOW (ref >60)
GFR, EST AFRICAN AMERICAN: 20 mL/min — AB (ref >60)
GLUCOSE: 123 mg/dL — AB (ref 65–99)
POTASSIUM: 4.9 mmol/L (ref 3.5–5.1)
Sodium: 135 mmol/L (ref 135–145)
TOTAL PROTEIN: 6.9 g/dL (ref 6.5–8.1)

## 2015-09-20 LAB — CBC WITH DIFFERENTIAL/PLATELET
BASOS ABS: 0.1 10*3/uL (ref 0.0–0.1)
BASOS PCT: 1 %
EOS ABS: 0.2 10*3/uL (ref 0.0–0.7)
EOS PCT: 3 %
HEMATOCRIT: 31.1 % — AB (ref 39.0–52.0)
Hemoglobin: 9.6 g/dL — ABNORMAL LOW (ref 13.0–17.0)
LYMPHS ABS: 1.5 10*3/uL (ref 0.7–4.0)
Lymphocytes Relative: 21 %
MCH: 19.2 pg — AB (ref 26.0–34.0)
MCHC: 30.9 g/dL (ref 30.0–36.0)
MCV: 62.3 fL — ABNORMAL LOW (ref 78.0–100.0)
MONO ABS: 0.4 10*3/uL (ref 0.1–1.0)
MONOS PCT: 6 %
NEUTROS ABS: 5.1 10*3/uL (ref 1.7–7.7)
Neutrophils Relative %: 69 %
Platelets: 147 10*3/uL — ABNORMAL LOW (ref 150–400)
RBC: 4.99 MIL/uL (ref 4.22–5.81)
RDW: 16.8 % — AB (ref 11.5–15.5)
WBC: 7.3 10*3/uL (ref 4.0–10.5)

## 2015-09-20 LAB — URINALYSIS, ROUTINE W REFLEX MICROSCOPIC
BILIRUBIN URINE: NEGATIVE
Glucose, UA: NEGATIVE mg/dL
Hgb urine dipstick: NEGATIVE
Ketones, ur: NEGATIVE mg/dL
Leukocytes, UA: NEGATIVE
NITRITE: NEGATIVE
Protein, ur: NEGATIVE mg/dL
SPECIFIC GRAVITY, URINE: 1.009 (ref 1.005–1.030)
pH: 5 (ref 5.0–8.0)

## 2015-09-20 LAB — I-STAT TROPONIN, ED: TROPONIN I, POC: 0 ng/mL (ref 0.00–0.08)

## 2015-09-20 LAB — BRAIN NATRIURETIC PEPTIDE: B Natriuretic Peptide: 196.1 pg/mL — ABNORMAL HIGH (ref 0.0–100.0)

## 2015-09-20 MED ORDER — VITAMIN C 500 MG PO TABS
500.0000 mg | ORAL_TABLET | Freq: Every day | ORAL | Status: DC
  Administered 2015-09-21 – 2015-09-23 (×3): 500 mg via ORAL
  Filled 2015-09-20 (×3): qty 1

## 2015-09-20 MED ORDER — HEPARIN SODIUM (PORCINE) 5000 UNIT/ML IJ SOLN
5000.0000 [IU] | Freq: Three times a day (TID) | INTRAMUSCULAR | Status: DC
  Administered 2015-09-20 – 2015-09-23 (×8): 5000 [IU] via SUBCUTANEOUS
  Filled 2015-09-20 (×8): qty 1

## 2015-09-20 MED ORDER — TIOTROPIUM BROMIDE MONOHYDRATE 18 MCG IN CAPS
18.0000 ug | ORAL_CAPSULE | Freq: Every day | RESPIRATORY_TRACT | Status: DC
  Administered 2015-09-21 – 2015-09-23 (×3): 18 ug via RESPIRATORY_TRACT
  Filled 2015-09-20: qty 5

## 2015-09-20 MED ORDER — CARVEDILOL 12.5 MG PO TABS
12.5000 mg | ORAL_TABLET | Freq: Two times a day (BID) | ORAL | Status: DC
  Administered 2015-09-21 – 2015-09-23 (×5): 12.5 mg via ORAL
  Filled 2015-09-20 (×5): qty 1

## 2015-09-20 MED ORDER — BUDESONIDE-FORMOTEROL FUMARATE 160-4.5 MCG/ACT IN AERO
2.0000 | INHALATION_SPRAY | Freq: Two times a day (BID) | RESPIRATORY_TRACT | Status: DC
  Administered 2015-09-21 – 2015-09-23 (×5): 2 via RESPIRATORY_TRACT
  Filled 2015-09-20: qty 6

## 2015-09-20 MED ORDER — GLYCOPYRROLATE-FORMOTEROL 9-4.8 MCG/ACT IN AERO: 2.0000 | INHALATION_SPRAY | Freq: Two times a day (BID) | RESPIRATORY_TRACT | Status: DC

## 2015-09-20 MED ORDER — ONDANSETRON HCL 4 MG PO TABS: 4.0000 mg | ORAL_TABLET | Freq: Four times a day (QID) | ORAL | Status: DC | PRN

## 2015-09-20 MED ORDER — IPRATROPIUM-ALBUTEROL 0.5-2.5 (3) MG/3ML IN SOLN: 3.0000 mL | RESPIRATORY_TRACT | Status: DC | PRN

## 2015-09-20 MED ORDER — GEMFIBROZIL 600 MG PO TABS
600.0000 mg | ORAL_TABLET | Freq: Two times a day (BID) | ORAL | Status: DC
  Administered 2015-09-21 – 2015-09-23 (×5): 600 mg via ORAL
  Filled 2015-09-20 (×7): qty 1

## 2015-09-20 MED ORDER — PREGABALIN 75 MG PO CAPS
75.0000 mg | ORAL_CAPSULE | Freq: Two times a day (BID) | ORAL | Status: DC
  Administered 2015-09-20 – 2015-09-23 (×6): 75 mg via ORAL
  Filled 2015-09-20 (×6): qty 1

## 2015-09-20 MED ORDER — POLYETHYLENE GLYCOL 3350 17 G PO PACK: 17.0000 g | PACK | Freq: Every day | ORAL | Status: DC | PRN

## 2015-09-20 MED ORDER — LINACLOTIDE 145 MCG PO CAPS
145.0000 ug | ORAL_CAPSULE | Freq: Every day | ORAL | Status: DC
  Administered 2015-09-21 – 2015-09-23 (×3): 145 ug via ORAL
  Filled 2015-09-20 (×4): qty 1

## 2015-09-20 MED ORDER — SODIUM CHLORIDE 0.9 % IV SOLN
INTRAVENOUS | Status: AC
  Administered 2015-09-20: 23:00:00 via INTRAVENOUS

## 2015-09-20 MED ORDER — SODIUM CHLORIDE 0.9 % IV BOLUS (SEPSIS)
1000.0000 mL | Freq: Once | INTRAVENOUS | Status: AC
  Administered 2015-09-20: 1000 mL via INTRAVENOUS

## 2015-09-20 MED ORDER — ALBUTEROL SULFATE HFA 108 (90 BASE) MCG/ACT IN AERS: 2.0000 | INHALATION_SPRAY | RESPIRATORY_TRACT | Status: DC | PRN

## 2015-09-20 MED ORDER — SODIUM CHLORIDE 0.9% FLUSH
3.0000 mL | Freq: Two times a day (BID) | INTRAVENOUS | Status: DC
  Administered 2015-09-20 – 2015-09-23 (×5): 3 mL via INTRAVENOUS

## 2015-09-20 MED ORDER — PANTOPRAZOLE SODIUM 40 MG PO TBEC
40.0000 mg | DELAYED_RELEASE_TABLET | Freq: Every day | ORAL | Status: DC
  Administered 2015-09-20 – 2015-09-23 (×4): 40 mg via ORAL
  Filled 2015-09-20 (×4): qty 1

## 2015-09-20 MED ORDER — ASPIRIN EC 81 MG PO TBEC
81.0000 mg | DELAYED_RELEASE_TABLET | Freq: Every day | ORAL | Status: DC
  Administered 2015-09-21 – 2015-09-23 (×3): 81 mg via ORAL
  Filled 2015-09-20 (×3): qty 1

## 2015-09-20 MED ORDER — SODIUM CHLORIDE 0.9 % IV BOLUS (SEPSIS): 500.0000 mL | Freq: Once | INTRAVENOUS | Status: DC

## 2015-09-20 MED ORDER — ACETAMINOPHEN 325 MG PO TABS: 650.0000 mg | ORAL_TABLET | Freq: Four times a day (QID) | ORAL | Status: DC | PRN

## 2015-09-20 MED ORDER — ONDANSETRON HCL 4 MG/2ML IJ SOLN: 4.0000 mg | Freq: Four times a day (QID) | INTRAMUSCULAR | Status: DC | PRN

## 2015-09-20 MED ORDER — NITROGLYCERIN 0.4 MG SL SUBL: 0.4000 mg | SUBLINGUAL_TABLET | SUBLINGUAL | Status: DC | PRN

## 2015-09-20 NOTE — ED Notes (Signed)
Dr Woodrum at bedside 

## 2015-09-20 NOTE — ED Notes (Signed)
Pt from home. Pt usually like to sit in car every day to warm up without car on. Pt's son came to car to check on pt. Pt did not wake up easily- unsure if pt had passed out. BP 80's per EMS. Pt is now alert and oriented. Pt states he has been feeling more weak lately. Pt fell last night and states he hit his head. Pt is on blood thinners. BP 120/56, HR 65, 16, CBG 160, spo2 98% on room air.

## 2015-09-20 NOTE — ED Provider Notes (Signed)
CSN: [id number]     Arrival date & time 09/20/15  1810 History   First MD Initiated Contact with Patient 09/20/15 1813     Chief Complaint  Patient presents with  . Near Syncope     (Consider location/radiation/quality/duration/timing/severity/associated sxs/prior Treatment) The history is provided by the patient.  [name] is a 82 y.o. male hx of MI, COPD, prostate cancer here with near syncope, AMS. Patient states that he has peripheral neuropathy and tripped on his foot yesterday and fell and hit his head. Today, he was laying in the sun for several hours and didn't drink much and the aid tried to wake him up and he was lethargic. EMS was called and he was noted to be hypotensive to the 80s. Denies vomiting or abdominal pain. Denies chest pain. Has hx of MI but no cardiac stents.    Past Medical History  Diagnosis Date  . Carotid artery stenosis   . Essential hypertension   . Myocardial infarction ([place]) 1979  . Coronary artery disease     Known occluded proximal LAD with collaterals, moderate RCA disease - managed medically 2009  . COPD (chronic obstructive pulmonary disease) ([place])   . History of stroke   . Prostate cancer ([place])   . Arthritis   . Foot drop, right   . Neuropathy ([place])   . Anemia   . Urinary incontinence, male, stress   . GERD (gastroesophageal reflux disease)   . Thalassemia   . Wears dentures   . Deafness in left ear     Wears hearing aide in right ear  . Sleep apnea     CPAP  . Ischemic cardiomyopathy     LVEF 40-45%  . Hypercholesteremia   . Anemia   . Osteoarthritis   . Stroke ([place])   . Heart attack ([place])   . Vertigo   . Hereditary and idiopathic peripheral neuropathy 03/01/2015  . Abnormality of gait 03/01/2015   Past Surgical History  Procedure Laterality Date  . Penile prosthesis implant    . Cholecystectomy    . Right carotid endarterectomy    . Back surgery    . Lumbar laminectomy  03/24/10    L3-4 and L4-5  . Ankle surgery      Bone  infection  . Cardiac catheterization    . Appendectomy    . Joint replacement Left   . Radioactive seed implant  2009  . Pars plana vitrectomy  03/19/2012    Procedure: PARS PLANA VITRECTOMY WITH 25 GAUGE;  Surgeon: Hayden Pedro, MD;  Location: [facility];  Service: Ophthalmology;  Laterality: Right;  . Carotid endarterectomy Right     CEA - 15 yrs ago  . Spine surgery    . Eye surgery    . Cataract extraction w/ intraocular lens implant Left   . Endarterectomy Left 09/28/2014    Procedure: Left Carotid Endarterectomy;  Surgeon: Rosetta Posner, MD;  Location: [facility];  Service: Vascular;  Laterality: Left;  . Patch angioplasty Left 09/28/2014    Procedure: WITH DACRON PATCH ANGIOPLASTY;  Surgeon: Rosetta Posner, MD;  Location[facility] OR;  Service: Vascular;  Laterality: Left;   Family History  Problem Relation Age of Onset  . Thalassemia      Family history  . Cancer Mother 79    Bone cancer  . Diabetes Mother   . Heart disease Mother   . Heart attack Mother   . Hypertension Mother   . Stroke Father 33  .  Heart disease Father   . Hypertension Father   . Hypertension Sister   . Diabetes Sister   . Heart disease Brother   . Heart attack Brother 81  . Cancer Brother    Social History  Substance Use Topics  . Smoking status: Former Smoker -- 1.00 packs/day for 20 years    Types: Cigarettes    Quit date: 08/14/1977  . Smokeless tobacco: Never Used  . Alcohol Use: No     Comment: occasional    Review of Systems  Cardiovascular: Positive for near-syncope.  Neurological: Positive for dizziness and weakness.  All other systems reviewed and are negative.     Allergies  Amoxicillin; Clopidogrel bisulfate; Dilaudid; Doxycycline hyclate; Penicillins; Sulfa antibiotics; Sulfamethoxazole; and Sulfonamide derivatives  Home Medications   Prior to Admission medications   Medication Sig Start Date End Date Taking? Authorizing Provider  acetaminophen (TYLENOL) 325 MG tablet Take 650 mg by  mouth every 6 (six) hours as needed for mild pain.     Historical Provider, MD  aspirin 81 MG tablet Take 81 mg by mouth daily.    Historical Provider, MD  carvedilol (COREG) 12.5 MG tablet TAKE 1 TABLET TWICE DAILY WITH A MEAL 06/08/15   Dorothy Spark, MD  furosemide (LASIX) 20 MG tablet TAKE 1 TABLET BY MOUTH EVERY DAY 06/04/15   Dorothy Spark, MD  gemfibrozil (LOPID) 600 MG tablet Take 600 mg by mouth 2 (two) times daily before a meal.    Historical Provider, MD  Glycopyrrolate-Formoterol (BEVESPI AEROSPHERE) 9-4.8 MCG/ACT AERO Inhale 2 puffs into the lungs every 12 (twelve) hours. 07/19/15   Tanda Rockers, MD  LINZESS 145 MCG CAPS capsule Take 1 capsule by mouth daily with breakfast. 06/29/15   Historical Provider, MD  losartan (COZAAR) 25 MG tablet Take 1 tablet (25 mg total) by mouth daily. 01/14/15   Marijean Heath, NP  meclizine (ANTIVERT) 25 MG tablet Take 1 tablet (25 mg total) by mouth daily as needed for dizziness. 01/29/15   Dorothy Spark, MD  MYRBETRIQ 50 MG TB24 tablet Take 50 mg by mouth daily. 06/29/15   Historical Provider, MD  NITROSTAT 0.4 MG SL tablet PLACE 1 TABLET (0.4 MG TOTAL) UNDER THE TONGUE EVERY 5 (FIVE) MINUTES AS NEEDED. FOR CHEST PAIN 04/08/15   Dorothy Spark, MD  omeprazole (PRILOSEC) 40 MG capsule Take 1 capsule (40 mg total) by mouth daily. 05/21/15   Dorothy Spark, MD  polyethylene glycol powder (MIRALAX) powder Take 17 g by mouth daily as needed. For constipation    Historical Provider, MD  pregabalin (LYRICA) 75 MG capsule Take 75 mg by mouth 2 (two) times daily.     Historical Provider, MD  simvastatin (ZOCOR) 10 MG tablet TAKE 1 TABLET BY MOUTH EVERY DAY 06/22/15   Dorothy Spark, MD  SYMBICORT 160-4.5 MCG/ACT inhaler INHALE 2 PUFFS FIRST THING IN THE MORNING AND THEN TAKE 2 PUFFS 12 HOURS LATER 07/23/15   Tanda Rockers, MD  VENTOLIN HFA 108 (90 BASE) MCG/ACT inhaler INHALE 2 PUFFS INTO THE LUNGS EVERY 4 HOURS AS NEEDED FOR WHEEZING OR  SHORTNESS OF BREATH 07/12/15   Tanda Rockers, MD  vitamin C (ASCORBIC ACID) 500 MG tablet Take 500 mg by mouth daily.    Historical Provider, MD   BP 119/71 mmHg  Pulse 64  Temp(Src) 97.7 F (36.5 C) (Oral)  Resp 12  Ht 6' (1.829 m)  Wt 169 lb (76.658 kg)  BMI 22.92 kg/m2  SpO2 99% Physical Exam  Constitutional: He is oriented to person, place, and time.  Chronically ill   HENT:  Head: Normocephalic and atraumatic.  Mouth/Throat: Oropharynx is clear and moist.  Eyes: Pupils are equal, round, and reactive to light.  Neck: Normal range of motion. Neck supple.  Cardiovascular: Normal rate, regular rhythm, normal heart sounds and intact distal pulses.   Pulmonary/Chest: Effort normal and breath sounds normal. No respiratory distress. He has no wheezes. He has no rales.  Abdominal: Soft. Bowel sounds are normal. He exhibits no distension. There is no tenderness. There is no rebound.  Musculoskeletal: Normal range of motion. He exhibits no edema or tenderness.  Neurological: He is alert and oriented to person, place, and time. No cranial nerve deficit. Coordination normal.  CN 2-12 intact. Nl strength throughout   Skin: Skin is warm and dry.  Psychiatric: He has a normal mood and affect. His behavior is normal. Judgment normal.  Nursing note and vitals reviewed.   ED Course  Procedures (including critical care time) Labs Review Labs Reviewed  CBC WITH DIFFERENTIAL/PLATELET - Abnormal; Notable for the following:    Hemoglobin 9.6 (*)    HCT 31.1 (*)    MCV 62.3 (*)    MCH 19.2 (*)    RDW 16.8 (*)    Platelets 147 (*)    All other components within normal limits  COMPREHENSIVE METABOLIC PANEL - Abnormal; Notable for the following:    Chloride 94 (*)    Glucose, Bld 123 (*)    BUN 60 (*)    Creatinine, Ser 2.95 (*)    Calcium 8.8 (*)    ALT 10 (*)    GFR calc non Af Amer 17 (*)    GFR calc Af Amer 20 (*)    Anion gap 17 (*)    All other components within normal limits   URINALYSIS, ROUTINE W REFLEX MICROSCOPIC (NOT AT [facility])  Randolm Idol, ED    Imaging Review Ct Head Wo Contrast  09/20/2015  CLINICAL DATA:  82 year old male with fall and trauma to the head. EXAM: CT HEAD WITHOUT CONTRAST CT CERVICAL SPINE WITHOUT CONTRAST TECHNIQUE: Multidetector CT imaging of the head and cervical spine was performed following the standard protocol without intravenous contrast. Multiplanar CT image reconstructions of the cervical spine were also generated. COMPARISON:  CT dated 11/20/2013 FINDINGS: CT HEAD FINDINGS There is slight prominence of the ventricles and sulci compatible with age-related volume loss. Mild periventricular and deep white matter hypodensities represent chronic microvascular ischemic changes. Small old left basal ganglia lacunar infarct. There is no intracranial hemorrhage. No mass effect or midline shift identified. There is partial opacification of the right frontal sinus. No air-fluid level. The remainder of the paranasal sinuses and mastoid air cells are well aerated. The calvarium is intact. CT CERVICAL SPINE FINDINGS There is no acute fracture or subluxation of the cervical spine.There is osteopenia with multilevel degenerative changes. There is disc desiccation and narrowing most prominent at C5-C6, and C6-C7.The odontoid and spinous processes are intact.There is normal anatomic alignment of the C1-C2 lateral masses. The visualized soft tissues appear unremarkable. Multiple surgical clips noted in the right side of the neck possibly related to carotid endarterectomy. Right carotid bulb calcific plaques noted. IMPRESSION: No acute intracranial hemorrhage. Mild age-related atrophy and chronic microvascular ischemic disease. No acute/traumatic cervical spine pathology. Electronically Signed   By: Anner Crete M.D.   On: 09/20/2015 19:08   Ct Cervical Spine Wo Contrast  09/20/2015  CLINICAL  DATA:  82 year old male with fall and trauma to the head. EXAM: CT  HEAD WITHOUT CONTRAST CT CERVICAL SPINE WITHOUT CONTRAST TECHNIQUE: Multidetector CT imaging of the head and cervical spine was performed following the standard protocol without intravenous contrast. Multiplanar CT image reconstructions of the cervical spine were also generated. COMPARISON:  CT dated 11/20/2013 FINDINGS: CT HEAD FINDINGS There is slight prominence of the ventricles and sulci compatible with age-related volume loss. Mild periventricular and deep white matter hypodensities represent chronic microvascular ischemic changes. Small old left basal ganglia lacunar infarct. There is no intracranial hemorrhage. No mass effect or midline shift identified. There is partial opacification of the right frontal sinus. No air-fluid level. The remainder of the paranasal sinuses and mastoid air cells are well aerated. The calvarium is intact. CT CERVICAL SPINE FINDINGS There is no acute fracture or subluxation of the cervical spine.There is osteopenia with multilevel degenerative changes. There is disc desiccation and narrowing most prominent at C5-C6, and C6-C7.The odontoid and spinous processes are intact.There is normal anatomic alignment of the C1-C2 lateral masses. The visualized soft tissues appear unremarkable. Multiple surgical clips noted in the right side of the neck possibly related to carotid endarterectomy. Right carotid bulb calcific plaques noted. IMPRESSION: No acute intracranial hemorrhage. Mild age-related atrophy and chronic microvascular ischemic disease. No acute/traumatic cervical spine pathology. Electronically Signed   By: Anner Crete M.D.   On: 09/20/2015 19:08   I have personally reviewed and evaluated these images and lab results as part of my medical decision-making.   EKG Interpretation   Date/Time:  Monday September 20 2015 18:29:30 EST Ventricular Rate:  65 PR Interval:  219 QRS Duration: 100 QT Interval:  425 QTC Calculation: 442 R Axis:   55 Text Interpretation:   Sinus rhythm Borderline prolonged PR interval  Anterolateral infarct, old No significant change since last tracing  Confirmed by YAO  MD, DAVID ([id number]) on 09/20/2015 6:50:50 PM      MDM   Final diagnoses:  None   [name] is a 82 y.o. male here with AMS. He has borderline BP in the ED. Consider syncope vs seizure. Will check orthostatics, labs, CT head given head injury yesterday.   8:12 PM CT unremarkable. Labs showed acute renal failure likely from dehydration. Ordered UA. Will admit.    Wandra Arthurs, MD 09/20/15 2018

## 2015-09-20 NOTE — ED Notes (Signed)
Pt states he did not hit his head yesterday. He stumbled and hit his arm when he fell. Pt also states that when he was sitting in his car today he was having dreams that he was dying. When his aid came to the car to try to get him out, he couldn't make himself wake up. Pt states he could hear his sitter calling him and telling him to get up and out of car, but he couldn't move.

## 2015-09-20 NOTE — ED Notes (Signed)
Pt's daughter stated that pts son took his wallet home with him but left his clothes. White tshirt, jeans, black hat, pair of shoes

## 2015-09-20 NOTE — ED Notes (Signed)
Pt's family stepped out and stated that pt hadn't been changed since he got here. Adam EMT and I changed pt brief. [place] RN notified

## 2015-09-20 NOTE — H&P (Signed)
[facility] History and Physical  [name] [id number] DOB: March 05, 1924 DOA: 09/20/2015  Referring physician: ED physician PCP:  Melinda Crutch, MD  Specialists: Dr. Melvyn Novas (pulm), Dr. Meda Coffee (cards), Dr. Sherren Mocha (vasc surg), Dr. Alen Blew (onc)   Chief Complaint:  Syncope   HPI: [name] is a 82 y.o. male and [employer] vet[employer] with PMH of coronary artery disease, carotid stenosis status post endarterectomy, COPD GOLD III, hypertension, and idiopathic peripheral neuropathy who presents to the ED following an episode at home where he was apparently asleep outside and difficult to arouse. Prior to the event, Mr. Masri had been in his usual state of health, denying fevers, chills, increased cough, malaise, nausea, vomiting, or diarrhea. He was sitting out in the sun earlier on the day of his presentation when his son went to check on him and found him to be apparently sleeping and very difficult to arouse. Patient was initially confused upon waking and has difficulty recalling the event. He does describe remembering that he heard his son and caretaker calling him to get up, but he felt unable to wake up or move. There is apparently full resolution of all symptoms over the course of several minutes and patient reported feeling back to his usual self. He denies any prior experience with similar symptoms. There is been no chest pain, palpitations, or headache, and there is been no recent change in his medications. Patient denies alcohol or illicit substance use, long distance travel, or sick contacts.  In ED, patient was found to be afebrile, saturating well on room air, and with vital signs stable. EKG demonstrates a sinus rhythm with first-degree AV block and noncontrast CT of the head and cervical spine are without acute abnormalities. Initial blood work includes a CMP which is notable for serum creatinine of 2.95, up from an apparent baseline of 1.3. CBC features a microcytic anemia and borderline thrombocytopenia.  Target cells are noted on the peripheral smear. Patient remained hemodynamically stable and asymptomatic in the emergency department and will be admitted under observation status for ongoing evaluation and management of suspected syncopal episode.   Where does patient live?   At home  Can patient participate in ADLs?  Yes      Review of Systems:   General: no fevers, chills, sweats, weight change, poor appetite, or fatigue HEENT: no blurry vision, hearing changes or sore throat Pulm: no new dyspnea, cough, or wheeze. Chronic non-productive cough CV: no chest pain or palpitations Abd: no nausea, vomiting, abdominal pain, diarrhea, or constipation GU: no dysuria, hematuria, increased urinary frequency, or urgency. Chronic incontinence  Ext: Rt ankle chronically swollen since remote fracture, no left leg edema  Neuro: no focal weakness, numbness, or tingling, no vision change. Chronic hearing loss Skin: no rash, no wounds MSK: no deformity, no red, hot, or swollen joint. Chronic left leg spasms  Heme: No easy bruising or bleeding Travel history: No recent long distant travel    Allergy:  Allergies  Allergen Reactions  . Amoxicillin     REACTION: unspecified  . Clopidogrel Bisulfate Hives    REACTION: red blue, black blotches  . Dilaudid [Hydromorphone Hcl] Hives    Hallucinations, little green men working on the building  . Doxycycline Hyclate   . Penicillins Hives    Has patient had a PCN reaction causing immediate rash, facial/tongue/throat swelling, SOB or lightheadedness with hypotension: Yes Has patient had a PCN reaction causing severe rash involving mucus membranes or skin necrosis: No Has patient had  a PCN reaction that required hospitalization No Has patient had a PCN reaction occurring within the last 10 years: No If all of the above answers are "NO", then may proceed with Cephalosporin use.   . Sulfa Antibiotics Other (See Comments)    Nightmares, hallucinations  .  Sulfamethoxazole Other (See Comments)    Nightmares, hallucinations  . Sulfonamide Derivatives Other (See Comments)    Nightmares, hallucinations    Past Medical History  Diagnosis Date  . Carotid artery stenosis   . Essential hypertension   . Myocardial infarction ([place]) 1979  . Coronary artery disease     Known occluded proximal LAD with collaterals, moderate RCA disease - managed medically 2009  . COPD (chronic obstructive pulmonary disease) ([place])   . History of stroke   . Prostate cancer ([place])   . Arthritis   . Foot drop, right   . Neuropathy ([place])   . Anemia   . Urinary incontinence, male, stress   . GERD (gastroesophageal reflux disease)   . Thalassemia   . Wears dentures   . Deafness in left ear     Wears hearing aide in right ear  . Sleep apnea     CPAP  . Ischemic cardiomyopathy     LVEF 40-45%  . Hypercholesteremia   . Anemia   . Osteoarthritis   . Stroke ([place])   . Heart attack ([place])   . Vertigo   . Hereditary and idiopathic peripheral neuropathy 03/01/2015  . Abnormality of gait 03/01/2015    Past Surgical History  Procedure Laterality Date  . Penile prosthesis implant    . Cholecystectomy    . Right carotid endarterectomy    . Back surgery    . Lumbar laminectomy  03/24/10    L3-4 and L4-5  . Ankle surgery      Bone infection  . Cardiac catheterization    . Appendectomy    . Joint replacement Left   . Radioactive seed implant  2009  . Pars plana vitrectomy  03/19/2012    Procedure: PARS PLANA VITRECTOMY WITH 25 GAUGE;  Surgeon: Hayden Pedro, MD;  Location: [facility];  Service: Ophthalmology;  Laterality: Right;  . Carotid endarterectomy Right     CEA - 15 yrs ago  . Spine surgery    . Eye surgery    . Cataract extraction w/ intraocular lens implant Left   . Endarterectomy Left 09/28/2014    Procedure: Left Carotid Endarterectomy;  Surgeon: Rosetta Posner, MD;  Location: [facility];  Service: Vascular;  Laterality: Left;  . Patch angioplasty Left 09/28/2014     Procedure: WITH DACRON PATCH ANGIOPLASTY;  Surgeon: Rosetta Posner, MD;  Location: [facility];  Service: Vascular;  Laterality: Left;    Social History:  reports that he quit smoking about 38 years ago. His smoking use included Cigarettes. He has a 20 pack-year smoking history. He has never used smokeless tobacco. He reports that he does not drink alcohol or use illicit drugs.  Family History:  Family History  Problem Relation Age of Onset  . Thalassemia      Family history  . Cancer Mother 32    Bone cancer  . Diabetes Mother   . Heart disease Mother   . Heart attack Mother   . Hypertension Mother   . Stroke Father 47  . Heart disease Father   . Hypertension Father   . Hypertension Sister   . Diabetes Sister   . Heart disease Brother   .  Heart attack Brother 10  . Cancer Brother      Prior to Admission medications   Medication Sig Start Date End Date Taking? Authorizing Provider  acetaminophen (TYLENOL) 325 MG tablet Take 650 mg by mouth every 6 (six) hours as needed for mild pain.    Yes Historical Provider, MD  aspirin 81 MG tablet Take 81 mg by mouth daily.   Yes Historical Provider, MD  carvedilol (COREG) 12.5 MG tablet TAKE 1 TABLET TWICE DAILY WITH A MEAL 06/08/15  Yes Dorothy Spark, MD  furosemide (LASIX) 20 MG tablet TAKE 1 TABLET BY MOUTH EVERY DAY Patient taking differently: Take 40 mg every morning 06/04/15  Yes Dorothy Spark, MD  gemfibrozil (LOPID) 600 MG tablet Take 600 mg by mouth 2 (two) times daily before a meal. Reported on 09/20/2015   Yes Historical Provider, MD  Glycopyrrolate-Formoterol (BEVESPI AEROSPHERE) 9-4.8 MCG/ACT AERO Inhale 2 puffs into the lungs every 12 (twelve) hours. 07/19/15  Yes Tanda Rockers, MD  LINZESS 145 MCG CAPS capsule Take 1 capsule by mouth daily with breakfast. 06/29/15  Yes Historical Provider, MD  losartan (COZAAR) 25 MG tablet Take 1 tablet (25 mg total) by mouth daily. 01/14/15  Yes Marijean Heath, NP  meclizine  (ANTIVERT) 25 MG tablet Take 1 tablet (25 mg total) by mouth daily as needed for dizziness. 01/29/15  Yes Dorothy Spark, MD  MYRBETRIQ 50 MG TB24 tablet Take 50 mg by mouth daily. 06/29/15  Yes Historical Provider, MD  NITROSTAT 0.4 MG SL tablet PLACE 1 TABLET (0.4 MG TOTAL) UNDER THE TONGUE EVERY 5 (FIVE) MINUTES AS NEEDED. FOR CHEST PAIN 04/08/15  Yes Dorothy Spark, MD  omeprazole (PRILOSEC) 40 MG capsule Take 1 capsule (40 mg total) by mouth daily. 05/21/15  Yes Dorothy Spark, MD  polyethylene glycol powder (MIRALAX) powder Take 17 g by mouth daily as needed. For constipation   Yes Historical Provider, MD  pregabalin (LYRICA) 75 MG capsule Take 75 mg by mouth 2 (two) times daily.    Yes Historical Provider, MD  SYMBICORT 160-4.5 MCG/ACT inhaler INHALE 2 PUFFS FIRST THING IN THE MORNING AND THEN TAKE 2 PUFFS 12 HOURS LATER 07/23/15  Yes Tanda Rockers, MD  VENTOLIN HFA 108 (90 BASE) MCG/ACT inhaler INHALE 2 PUFFS INTO THE LUNGS EVERY 4 HOURS AS NEEDED FOR WHEEZING OR SHORTNESS OF BREATH Patient taking differently: INHALE 2 PUFFS INTO THE LUNGS daily. 07/12/15  Yes Tanda Rockers, MD  vitamin C (ASCORBIC ACID) 500 MG tablet Take 500 mg by mouth daily.   Yes Historical Provider, MD  simvastatin (ZOCOR) 10 MG tablet TAKE 1 TABLET BY MOUTH EVERY DAY Patient not taking: Reported on 09/20/2015 06/22/15   Dorothy Spark, MD    Physical Exam: Filed Vitals:   09/20/15 1830 09/20/15 1915 09/20/15 2000 09/20/15 2100  BP: 100/51 119/71 130/65 132/59  Pulse:  64 71 66  Temp:      TempSrc:      Resp: 21 12 20 16   Height:      Weight:      SpO2:  99% 99% 98%   General: Not in acute distress HEENT:       Eyes: PERRL, EOMI, no scleral icterus or conjunctival pallor.       ENT: No discharge from the ears or nose, no pharyngeal ulcers, petechiae or exudate, no tonsillar enlargement.        Neck: No JVD, no bruit, no appreciable mass Heme: No  cervical adenopathy, no pallor Cardiac: S1/S2, RRR,  soft mid-systolic murmur at LUSB, No gallops or rubs. Pulm: Good air movement bilaterally. No rales, wheezing, rhonchi or rubs. Abd: Soft, nondistended, nontender, no rebound pain or gaurding, no mass or organomegaly, BS present. Ext: 1+DP/PT pulse bilaterally. Right ankle with pitting edema, no edema to left leg Musculoskeletal: No gross deformity, no red, hot, swollen joints, no limitation in ROM. Exception is rt ankle which is swollen and with bony deformity   Skin: No rashes or wounds on exposed surfaces  Neuro: Alert, oriented X3, cranial nerves II-XII grossly intact, muscle strength 5/5 in all extremities, sensation to light touch intact. Brachial reflex 2+ bilaterally. Knee reflex 2+ bilaterally. Negative Babinski's sign. Normal finger to nose test. No focal findings Psych: Patient is not overtly psychotic, appropriate mood and affect.  Labs on Admission:  Basic Metabolic Panel:  Recent Labs Lab 09/20/15 1833  NA 135  K 4.9  CL 94*  CO2 24  GLUCOSE 123*  BUN 60*  CREATININE 2.95*  CALCIUM 8.8*   Liver Function Tests:  Recent Labs Lab 09/20/15 1833  AST 17  ALT 10*  ALKPHOS 85  BILITOT 0.8  PROT 6.9  ALBUMIN 3.5   No results for input(s): LIPASE, AMYLASE in the last 168 hours. No results for input(s): AMMONIA in the last 168 hours. CBC:  Recent Labs Lab 09/20/15 1833  WBC 7.3  NEUTROABS 5.1  HGB 9.6*  HCT 31.1*  MCV 62.3*  PLT 147*   Cardiac Enzymes: No results for input(s): CKTOTAL, CKMB, CKMBINDEX, TROPONINI in the last 168 hours.  BNP (last 3 results)  Recent Labs  01/08/15 0729 01/08/15 1000  BNP 569.6* 439.7*    ProBNP (last 3 results) No results for input(s): PROBNP in the last 8760 hours.  CBG: No results for input(s): GLUCAP in the last 168 hours.  Radiological Exams on Admission: Ct Head Wo Contrast  09/20/2015  CLINICAL DATA:  82 year old male with fall and trauma to the head. EXAM: CT HEAD WITHOUT CONTRAST CT CERVICAL SPINE WITHOUT  CONTRAST TECHNIQUE: Multidetector CT imaging of the head and cervical spine was performed following the standard protocol without intravenous contrast. Multiplanar CT image reconstructions of the cervical spine were also generated. COMPARISON:  CT dated 11/20/2013 FINDINGS: CT HEAD FINDINGS There is slight prominence of the ventricles and sulci compatible with age-related volume loss. Mild periventricular and deep white matter hypodensities represent chronic microvascular ischemic changes. Small old left basal ganglia lacunar infarct. There is no intracranial hemorrhage. No mass effect or midline shift identified. There is partial opacification of the right frontal sinus. No air-fluid level. The remainder of the paranasal sinuses and mastoid air cells are well aerated. The calvarium is intact. CT CERVICAL SPINE FINDINGS There is no acute fracture or subluxation of the cervical spine.There is osteopenia with multilevel degenerative changes. There is disc desiccation and narrowing most prominent at C5-C6, and C6-C7.The odontoid and spinous processes are intact.There is normal anatomic alignment of the C1-C2 lateral masses. The visualized soft tissues appear unremarkable. Multiple surgical clips noted in the right side of the neck possibly related to carotid endarterectomy. Right carotid bulb calcific plaques noted. IMPRESSION: No acute intracranial hemorrhage. Mild age-related atrophy and chronic microvascular ischemic disease. No acute/traumatic cervical spine pathology. Electronically Signed   By: Anner Crete M.D.   On: 09/20/2015 19:08   Ct Cervical Spine Wo Contrast  09/20/2015  CLINICAL DATA:  82 year old male with fall and trauma to the head. EXAM: CT HEAD  WITHOUT CONTRAST CT CERVICAL SPINE WITHOUT CONTRAST TECHNIQUE: Multidetector CT imaging of the head and cervical spine was performed following the standard protocol without intravenous contrast. Multiplanar CT image reconstructions of the cervical spine  were also generated. COMPARISON:  CT dated 11/20/2013 FINDINGS: CT HEAD FINDINGS There is slight prominence of the ventricles and sulci compatible with age-related volume loss. Mild periventricular and deep white matter hypodensities represent chronic microvascular ischemic changes. Small old left basal ganglia lacunar infarct. There is no intracranial hemorrhage. No mass effect or midline shift identified. There is partial opacification of the right frontal sinus. No air-fluid level. The remainder of the paranasal sinuses and mastoid air cells are well aerated. The calvarium is intact. CT CERVICAL SPINE FINDINGS There is no acute fracture or subluxation of the cervical spine.There is osteopenia with multilevel degenerative changes. There is disc desiccation and narrowing most prominent at C5-C6, and C6-C7.The odontoid and spinous processes are intact.There is normal anatomic alignment of the C1-C2 lateral masses. The visualized soft tissues appear unremarkable. Multiple surgical clips noted in the right side of the neck possibly related to carotid endarterectomy. Right carotid bulb calcific plaques noted. IMPRESSION: No acute intracranial hemorrhage. Mild age-related atrophy and chronic microvascular ischemic disease. No acute/traumatic cervical spine pathology. Electronically Signed   By: Anner Crete M.D.   On: 09/20/2015 19:08    EKG: Independently reviewed.  Abnormal findings:  Sinus rhythm, first degree AV block    Assessment/Plan  1. Syncope/presyncope - Uncertain etiology, suspected secondary to dehydration given AKI and clinical dehydration  - Orthostatic vitals neg in ED, but he had been bolused NS an hour prior  - No focal deficits on arrival and pt reports return to baseline (his son agrees that pt back to baseline)  - Cardiac etiology possible, will monitor on telemetry overnight  - Received 2 L NS in ED, continuing with NS at 100 cc/hr overnight, urine clearing  - Neuro checks  overnight; fall precautions    2. AKI - SCr 2.95 on arrival with baseline apparently ~ 1.3  - Suspect secondary to dehydration in setting of laying out in sun for several hours, poor PO fluid intake, and Lasix use  - Continuing IVF resuscitation  - Avoiding nephrotoxins where possible, holding Lasix and losartan - Repeat chem panel in am    3. COPD, GOLD III - No supplemental O2 requirement  - Appears stable with no suggestion of exacerbation  - Will continue his home inhalers, Symbicort and Bevespi, with formulary equivalents  - DuoNebs q4h prn SOB or wheezing  - Continuous pulse oximetry with FiO2 titrated to maintain sat >92%   4. Chronic combined systolic/diastolic CHF  - TTE ([id number]) EF [id number], grade 1 diastolic dysfunction  - Clinically dry on exam; chem panel and dark urine supports  - Fluid resuscitated, will resume Lasix when needed  - Continuing Coreg 12.5 BID  - Holding ARB until renal fxn stabilizes   5. CAD, carotid artery stenosis  - Continue Coreg, ASA 81, Lopid  - No angina or equivalent sxs  - EKG with no acute ischemic changes, troponin undetectable - Monitoring on telemetry    6. Microcytic anemia  - Hgb 9.6 on admission with MCV 62.3  - Baseline Hgb appears to be ~10.5-12 - Labs drawn after fluid bolus, perhaps leading to dilutional effect  - No sign of active blood loss  - Pt reports known diagnosis of thalassemia    DVT ppx: SQ Heparin    Code Status: Full code  Family Communication:  Yes, patient's son at bed side Disposition Plan: Admit to inpatient   Date of Service 09/20/2015    Vianne Bulls, MD [facility] Pager [phone]  If 7PM-7AM, please contact night-coverage [email or web address] Password [email or web address] 09/20/2015, 9:40 PM

## 2015-09-21 DIAGNOSIS — N179 Acute kidney failure, unspecified: Secondary | ICD-10-CM | POA: Diagnosis not present

## 2015-09-21 DIAGNOSIS — J449 Chronic obstructive pulmonary disease, unspecified: Secondary | ICD-10-CM | POA: Diagnosis not present

## 2015-09-21 DIAGNOSIS — R55 Syncope and collapse: Secondary | ICD-10-CM | POA: Diagnosis not present

## 2015-09-21 DIAGNOSIS — I5032 Chronic diastolic (congestive) heart failure: Secondary | ICD-10-CM | POA: Diagnosis not present

## 2015-09-21 LAB — COMPREHENSIVE METABOLIC PANEL
ALK PHOS: 73 U/L (ref 38–126)
ALT: 9 U/L — ABNORMAL LOW (ref 17–63)
ANION GAP: 14 (ref 5–15)
AST: 15 U/L (ref 15–41)
Albumin: 2.9 g/dL — ABNORMAL LOW (ref 3.5–5.0)
BUN: 56 mg/dL — ABNORMAL HIGH (ref 6–20)
CALCIUM: 8.6 mg/dL — AB (ref 8.9–10.3)
CO2: 25 mmol/L (ref 22–32)
Chloride: 104 mmol/L (ref 101–111)
Creatinine, Ser: 2.39 mg/dL — ABNORMAL HIGH (ref 0.61–1.24)
GFR calc non Af Amer: 22 mL/min — ABNORMAL LOW (ref >60)
GFR, EST AFRICAN AMERICAN: 26 mL/min — AB (ref >60)
Glucose, Bld: 118 mg/dL — ABNORMAL HIGH (ref 65–99)
Potassium: 4.1 mmol/L (ref 3.5–5.1)
Sodium: 143 mmol/L (ref 135–145)
TOTAL PROTEIN: 5.8 g/dL — AB (ref 6.5–8.1)
Total Bilirubin: 0.7 mg/dL (ref 0.3–1.2)

## 2015-09-21 LAB — GLUCOSE, CAPILLARY
GLUCOSE-CAPILLARY: 103 mg/dL — AB (ref 65–99)
GLUCOSE-CAPILLARY: 108 mg/dL — AB (ref 65–99)

## 2015-09-21 LAB — MRSA PCR SCREENING: MRSA by PCR: NEGATIVE

## 2015-09-21 NOTE — Progress Notes (Signed)
[facility] PROGRESS NOTE   [name] [id number] DOB: January 20, 1924 DOA: 09/20/2015 PCP:  Melinda Crutch, MD  HPI/Subjective: Still feels weaker, continue IV fluid hydration. Likely to be discharged in a.m. if creatinine close to baseline.  Assessment/Plan: Principal Problem:   Near syncope Active Problems:   HYPERTENSION, BENIGN   CAD, NATIVE VESSEL   DIASTOLIC HEART FAILURE, CHRONIC   COPD GOLD III   Microcytic anemia   Carotid stenosis   Acute kidney injury ([place])   Syncope   Syncope/presyncope - Uncertain etiology, suspected secondary to dehydration given AKI and clinical dehydration  - Orthostatic vitals neg in ED, but he had been bolused NS an hour prior  - Cardiac etiology possible, will monitor on telemetry overnight  - Received 2 L NS in ED, continuing with NS at 100 cc/hr overnight, urine clearing    AKI - SCr 2.95 on arrival with baseline apparently ~ 1.3,creatinine is 2.39. - Suspect secondary to dehydration in setting of laying out in sun for several hours, poor PO fluid intake, and Lasix use  - Continuing IVF resuscitation    COPD, GOLD III - No supplemental O2 requirement  - Appears stable with no suggestion of exacerbation  - Will continue his home inhalers, Symbicort and Bevespi, with formulary equivalents  - DuoNebs q4h prn SOB or wheezing  - Continuous pulse oximetry with FiO2 titrated to maintain sat >92%   Chronic combined systolic/diastolic CHF  - TTE ([id number]) EF [id number], grade 1 diastolic dysfunction  - Clinically dry on exam; chem panel and dark urine supports  - Fluid resuscitated, will resume Lasix when needed  - Continuing Coreg 12.5 BID  - Holding ARB until renal fxn stabilizes   CAD, carotid artery stenosis  - Continue Coreg, ASA 81, Lopid  - No angina or equivalent sxs  - EKG with no acute ischemic changes, troponin undetectable - Monitoring on telemetry   Microcytic anemia  - Hgb 9.6 on admission with  MCV 62.3  - Baseline Hgb appears to be ~10.5-12 - Labs drawn after fluid bolus, perhaps leading to dilutional effect  - No sign of active blood loss  - Pt reports known diagnosis of thalassemia   Code Status: Full Code Family Communication: Plan discussed with the patient. Disposition Plan: Remains inpatient Diet: Diet Heart Room service appropriate?: Yes; Fluid consistency:: Thin; Fluid restriction:: 1500 mL Fluid  Consultants:  None  Procedures:  None  Antibiotics:  None (indicate start date, and stop date if known)   Objective: Filed Vitals:   09/21/15 1022 09/21/15 1238  BP: 99/44 99/45  Pulse: 64 64  Temp:  97.9 F (36.6 C)  Resp:  18    Intake/Output Summary (Last 24 hours) at 09/21/15 1245 Last data filed at 09/21/15 1033  Gross per 24 hour  Intake 1173.33 ml  Output    750 ml  Net 423.33 ml   Filed Weights   09/20/15 1824 09/20/15 2216 09/21/15 0602  Weight: 76.658 kg (169 lb) 76.522 kg (168 lb 11.2 oz) 76.34 kg (168 lb 4.8 oz)    Exam: General: Alert and awake, oriented x3, not in any acute distress. HEENT: anicteric sclera, pupils reactive to light and accommodation, EOMI CVS: S1-S2 clear, no murmur rubs or gallops Chest: clear to auscultation bilaterally, no wheezing, rales or rhonchi Abdomen: soft nontender, nondistended, normal bowel sounds, no organomegaly Extremities: no cyanosis, clubbing or edema noted bilaterally Neuro: Cranial nerves II-XII intact, no focal neurological deficits  Data Reviewed: Basic Metabolic Panel:  Recent  Labs Lab 09/20/15 1833 09/21/15 0501  NA 135 143  K 4.9 4.1  CL 94* 104  CO2 24 25  GLUCOSE 123* 118*  BUN 60* 56*  CREATININE 2.95* 2.39*  CALCIUM 8.8* 8.6*   Liver Function Tests:  Recent Labs Lab 09/20/15 1833 09/21/15 0501  AST 17 15  ALT 10* 9*  ALKPHOS 85 73  BILITOT 0.8 0.7  PROT 6.9 5.8*  ALBUMIN 3.5 2.9*   No results for input(s): LIPASE, AMYLASE in the last 168 hours. No results  for input(s): AMMONIA in the last 168 hours. CBC:  Recent Labs Lab 09/20/15 1833  WBC 7.3  NEUTROABS 5.1  HGB 9.6*  HCT 31.1*  MCV 62.3*  PLT 147*   Cardiac Enzymes: No results for input(s): CKTOTAL, CKMB, CKMBINDEX, TROPONINI in the last 168 hours. BNP (last 3 results)  Recent Labs  01/08/15 0729 01/08/15 1000 09/20/15 1833  BNP 569.6* 439.7* 196.1*    ProBNP (last 3 results) No results for input(s): PROBNP in the last 8760 hours.  CBG:  Recent Labs Lab 09/21/15 0608 09/21/15 0754  GLUCAP 103* 108*    Micro Recent Results (from the past 240 hour(s))  MRSA PCR Screening     Status: None   Collection Time: 09/20/15 10:42 PM  Result Value Ref Range Status   MRSA by PCR NEGATIVE NEGATIVE Final    Comment:        The GeneXpert MRSA Assay (FDA approved for NASAL specimens only), is one component of a comprehensive MRSA colonization surveillance program. It is not intended to diagnose MRSA infection nor to guide or monitor treatment for MRSA infections.      Studies: X-ray Chest Pa And Lateral  09/20/2015  CLINICAL DATA:  Syncope and weakness.  COPD. EXAM: CHEST  2 VIEW COMPARISON:  01/29/2015. FINDINGS: The heart size and mediastinal contours are within normal limits. Both lungs are clear. The visualized skeletal structures are unremarkable. Hyperinflation without pneumothorax. Surgical clips RIGHT neck stable. Thoracic atherosclerosis. Thoracic spine degenerative change. Similar appearance to priors. IMPRESSION: COPD, no active disease. Electronically Signed   By: Staci Righter M.D.   On: 09/20/2015 22:18   Ct Head Wo Contrast  09/20/2015  CLINICAL DATA:  82 year old male with fall and trauma to the head. EXAM: CT HEAD WITHOUT CONTRAST CT CERVICAL SPINE WITHOUT CONTRAST TECHNIQUE: Multidetector CT imaging of the head and cervical spine was performed following the standard protocol without intravenous contrast. Multiplanar CT image reconstructions of the cervical  spine were also generated. COMPARISON:  CT dated 11/20/2013 FINDINGS: CT HEAD FINDINGS There is slight prominence of the ventricles and sulci compatible with age-related volume loss. Mild periventricular and deep white matter hypodensities represent chronic microvascular ischemic changes. Small old left basal ganglia lacunar infarct. There is no intracranial hemorrhage. No mass effect or midline shift identified. There is partial opacification of the right frontal sinus. No air-fluid level. The remainder of the paranasal sinuses and mastoid air cells are well aerated. The calvarium is intact. CT CERVICAL SPINE FINDINGS There is no acute fracture or subluxation of the cervical spine.There is osteopenia with multilevel degenerative changes. There is disc desiccation and narrowing most prominent at C5-C6, and C6-C7.The odontoid and spinous processes are intact.There is normal anatomic alignment of the C1-C2 lateral masses. The visualized soft tissues appear unremarkable. Multiple surgical clips noted in the right side of the neck possibly related to carotid endarterectomy. Right carotid bulb calcific plaques noted. IMPRESSION: No acute intracranial hemorrhage. Mild age-related atrophy and chronic  microvascular ischemic disease. No acute/traumatic cervical spine pathology. Electronically Signed   By: Anner Crete M.D.   On: 09/20/2015 19:08   Ct Cervical Spine Wo Contrast  09/20/2015  CLINICAL DATA:  82 year old male with fall and trauma to the head. EXAM: CT HEAD WITHOUT CONTRAST CT CERVICAL SPINE WITHOUT CONTRAST TECHNIQUE: Multidetector CT imaging of the head and cervical spine was performed following the standard protocol without intravenous contrast. Multiplanar CT image reconstructions of the cervical spine were also generated. COMPARISON:  CT dated 11/20/2013 FINDINGS: CT HEAD FINDINGS There is slight prominence of the ventricles and sulci compatible with age-related volume loss. Mild periventricular and  deep white matter hypodensities represent chronic microvascular ischemic changes. Small old left basal ganglia lacunar infarct. There is no intracranial hemorrhage. No mass effect or midline shift identified. There is partial opacification of the right frontal sinus. No air-fluid level. The remainder of the paranasal sinuses and mastoid air cells are well aerated. The calvarium is intact. CT CERVICAL SPINE FINDINGS There is no acute fracture or subluxation of the cervical spine.There is osteopenia with multilevel degenerative changes. There is disc desiccation and narrowing most prominent at C5-C6, and C6-C7.The odontoid and spinous processes are intact.There is normal anatomic alignment of the C1-C2 lateral masses. The visualized soft tissues appear unremarkable. Multiple surgical clips noted in the right side of the neck possibly related to carotid endarterectomy. Right carotid bulb calcific plaques noted. IMPRESSION: No acute intracranial hemorrhage. Mild age-related atrophy and chronic microvascular ischemic disease. No acute/traumatic cervical spine pathology. Electronically Signed   By: Anner Crete M.D.   On: 09/20/2015 19:08    Scheduled Meds: . aspirin EC  81 mg Oral Daily  . budesonide-formoterol  2 puff Inhalation BID  . carvedilol  12.5 mg Oral BID WC  . gemfibrozil  600 mg Oral BID AC  . heparin  5,000 Units Subcutaneous 3 times per day  . Linaclotide  145 mcg Oral Q breakfast  . pantoprazole  40 mg Oral Daily  . pregabalin  75 mg Oral BID  . sodium chloride flush  3 mL Intravenous Q12H  . tiotropium  18 mcg Inhalation Daily  . vitamin C  500 mg Oral Daily   Continuous Infusions:      Time spent: 35 minutes    [facility] A  [facility] Pager [phone] If 7PM-7AM, please contact night-coverage at [email or web address], password [facility] 09/21/2015, 12:45 PM

## 2015-09-22 DIAGNOSIS — I255 Ischemic cardiomyopathy: Secondary | ICD-10-CM | POA: Diagnosis present

## 2015-09-22 DIAGNOSIS — H9192 Unspecified hearing loss, left ear: Secondary | ICD-10-CM | POA: Diagnosis present

## 2015-09-22 DIAGNOSIS — R55 Syncope and collapse: Secondary | ICD-10-CM

## 2015-09-22 DIAGNOSIS — I5032 Chronic diastolic (congestive) heart failure: Secondary | ICD-10-CM | POA: Diagnosis not present

## 2015-09-22 DIAGNOSIS — J449 Chronic obstructive pulmonary disease, unspecified: Secondary | ICD-10-CM | POA: Diagnosis present

## 2015-09-22 DIAGNOSIS — Z808 Family history of malignant neoplasm of other organs or systems: Secondary | ICD-10-CM | POA: Diagnosis not present

## 2015-09-22 DIAGNOSIS — I2510 Atherosclerotic heart disease of native coronary artery without angina pectoris: Secondary | ICD-10-CM | POA: Diagnosis present

## 2015-09-22 DIAGNOSIS — Z87891 Personal history of nicotine dependence: Secondary | ICD-10-CM | POA: Diagnosis not present

## 2015-09-22 DIAGNOSIS — E7800 Pure hypercholesterolemia, unspecified: Secondary | ICD-10-CM | POA: Diagnosis present

## 2015-09-22 DIAGNOSIS — N183 Chronic kidney disease, stage 3 (moderate): Secondary | ICD-10-CM | POA: Diagnosis present

## 2015-09-22 DIAGNOSIS — D696 Thrombocytopenia, unspecified: Secondary | ICD-10-CM | POA: Diagnosis present

## 2015-09-22 DIAGNOSIS — I5042 Chronic combined systolic (congestive) and diastolic (congestive) heart failure: Secondary | ICD-10-CM | POA: Diagnosis present

## 2015-09-22 DIAGNOSIS — Z823 Family history of stroke: Secondary | ICD-10-CM | POA: Diagnosis not present

## 2015-09-22 DIAGNOSIS — N179 Acute kidney failure, unspecified: Secondary | ICD-10-CM | POA: Diagnosis present

## 2015-09-22 DIAGNOSIS — E860 Dehydration: Secondary | ICD-10-CM | POA: Diagnosis present

## 2015-09-22 DIAGNOSIS — Z8249 Family history of ischemic heart disease and other diseases of the circulatory system: Secondary | ICD-10-CM | POA: Diagnosis not present

## 2015-09-22 DIAGNOSIS — I130 Hypertensive heart and chronic kidney disease with heart failure and stage 1 through stage 4 chronic kidney disease, or unspecified chronic kidney disease: Secondary | ICD-10-CM | POA: Diagnosis present

## 2015-09-22 DIAGNOSIS — I10 Essential (primary) hypertension: Secondary | ICD-10-CM

## 2015-09-22 DIAGNOSIS — K219 Gastro-esophageal reflux disease without esophagitis: Secondary | ICD-10-CM | POA: Diagnosis present

## 2015-09-22 DIAGNOSIS — Z833 Family history of diabetes mellitus: Secondary | ICD-10-CM | POA: Diagnosis not present

## 2015-09-22 DIAGNOSIS — I252 Old myocardial infarction: Secondary | ICD-10-CM | POA: Diagnosis not present

## 2015-09-22 DIAGNOSIS — D569 Thalassemia, unspecified: Secondary | ICD-10-CM | POA: Diagnosis present

## 2015-09-22 DIAGNOSIS — D509 Iron deficiency anemia, unspecified: Secondary | ICD-10-CM

## 2015-09-22 DIAGNOSIS — Z79899 Other long term (current) drug therapy: Secondary | ICD-10-CM | POA: Diagnosis not present

## 2015-09-22 DIAGNOSIS — I6529 Occlusion and stenosis of unspecified carotid artery: Secondary | ICD-10-CM | POA: Diagnosis present

## 2015-09-22 DIAGNOSIS — Z8546 Personal history of malignant neoplasm of prostate: Secondary | ICD-10-CM | POA: Diagnosis not present

## 2015-09-22 DIAGNOSIS — Z8673 Personal history of transient ischemic attack (TIA), and cerebral infarction without residual deficits: Secondary | ICD-10-CM | POA: Diagnosis not present

## 2015-09-22 LAB — URINE CULTURE

## 2015-09-22 LAB — GLUCOSE, CAPILLARY: Glucose-Capillary: 123 mg/dL — ABNORMAL HIGH (ref 65–99)

## 2015-09-22 MED ORDER — SODIUM CHLORIDE 0.9 % IV SOLN
INTRAVENOUS | Status: DC
  Administered 2015-09-22 – 2015-09-23 (×2): via INTRAVENOUS

## 2015-09-22 NOTE — Evaluation (Signed)
Physical Therapy Evaluation Patient Details Name: [name] MRN: [id number] DOB: [birth year] Today's Date: 09/22/2015   History of Present Illness  82 yo male admitted with syncope. pt with recent fall at son's house PMH: Deaf L ear, MI, COPD, prostate CA, essential HTN, CAD, hx of CVA, arthritis, GERD, R foot drop, vertigo, idiopathic peripheral neuropathy  Clinical Impression  Patient demonstrates deficits in functional mobility as indicated below. Will benefit from continued skilled PT to address deficits and maximize function. Will see as indicated and progress as tolerated.   OF NOTE: patient with some instability with activity, history of falls. Currently has aide during the day, will likely need increased supervision upon initial discharge, if can not be arranged may need to consider SNF.    Follow Up Recommendations Home health PT;Supervision/Assistance - 24 hour (If initial 24/hr assist can not be arranged may need SNF)    Equipment Recommendations  None recommended by PT    Recommendations for Other Services       Precautions / Restrictions Precautions Precautions: Fall Precaution Comments: bil LE AFO shoes Restrictions Weight Bearing Restrictions: No      Mobility  Bed Mobility Overal bed mobility: Independent             General bed mobility comments: received in chair  Transfers Overall transfer level: Needs assistance Equipment used: Rolling walker (2 wheeled) Transfers: Sit to/from Stand Sit to Stand: Min guard         General transfer comment: No physical assist requried, min guard for stability  Ambulation/Gait Ambulation/Gait assistance: Min guard Ambulation Distance (Feet): 120 Feet Assistive device: Rolling walker (2 wheeled) Gait Pattern/deviations: Step-through pattern;Decreased stride length;Steppage;Narrow base of support;Drifts right/left;Trunk flexed Gait velocity: decreased Gait velocity interpretation: Below normal speed for  age/gender General Gait Details: decreased gait speed, modest instability, increased instability with challenge (increased LE fatigue with distance)  Stairs            Wheelchair Mobility    Modified Rankin (Stroke Patients Only)       Balance Overall balance assessment: History of Falls (instability with higher level balance activities)                                           Pertinent Vitals/Pain Pain Assessment: No/denies pain    Home Living Family/patient expects to be discharged to:: Private residence Living Arrangements: Alone Available Help at Discharge: Available PRN/intermittently (aide from 7 A- 7P) Type of Home: House Home Access: Stairs to enter   [employer] of Steps: 3 Home Layout: One level Home Equipment: Walker - 2 wheels;Walker - 4 wheels      Prior Function Level of Independence: Needs assistance   Gait / Transfers Assistance Needed: walks with RW  ADL's / Homemaking Assistance Needed: aid present 7A- 7P to assist with house hold and shopping        Hand Dominance   Dominant Hand: Right    Extremity/Trunk Assessment   Upper Extremity Assessment: Overall WFL for tasks assessed           Lower Extremity Assessment:  (Overall strength hip flexion, knee extension WFL) RLE Deficits / Details: AFO LLE Deficits / Details: AFO  Cervical / Trunk Assessment: Normal  Communication   Communication: HOH  Cognition Arousal/Alertness: Awake/alert Behavior During Therapy: WFL for tasks assessed/performed Overall Cognitive Status: Impaired/Different from baseline Area of Impairment: Safety/judgement  Safety/Judgement: Decreased awareness of deficits     General Comments: pt reports no falls but with further questioning reports the cut on forearm is from bumping the bathroom wall. Further evidence of balance deficits but pt denies falls due to not actually falling to the ground. Pt does state "i did  fall in my sons living room last week but thats it"    General Comments General comments (skin integrity, edema, etc.): Spoke with patient regarding exercise program that he goes to at home    Exercises        Assessment/Plan    PT Assessment Patient needs continued PT services  PT Diagnosis Difficulty walking;Abnormality of gait;Generalized weakness   PT Problem List Decreased strength;Decreased activity tolerance;Decreased balance;Decreased mobility;Decreased safety awareness;Cardiopulmonary status limiting activity  PT Treatment Interventions DME instruction;Gait training;Stair training;Functional mobility training;Therapeutic activities;Therapeutic exercise;Balance training;Patient/family education   PT Goals (Current goals can be found in the Care Plan section) Acute Rehab PT Goals Patient Stated Goal: to go home today PT Goal Formulation: With patient Time For Goal Achievement: 10/06/15 Potential to Achieve Goals: Good    Frequency Min 3X/week   Barriers to discharge        Co-evaluation               End of Session Equipment Utilized During Treatment: Gait belt Activity Tolerance: Patient tolerated treatment well        Functional Assessment Tool Used: clinical judgement Functional Limitation: Mobility: Walking and moving around Mobility: Walking and Moving Around Current Status [id number]): At least 20 percent but less than 40 percent impaired, limited or restricted Mobility: Walking and Moving Around Goal Status [phone]): At least 1 percent but less than 20 percent impaired, limited or restricted    Time: 0845-0903 PT Time Calculation (min) (ACUTE ONLY): 18 min   Charges:   PT Evaluation $PT Eval Moderate Complexity: 1 Procedure     PT G Codes:   PT G-Codes **NOT FOR INPATIENT CLASS** Functional Assessment Tool Used: clinical judgement Functional Limitation: Mobility: Walking and moving around Mobility: Walking and Moving Around Current Status [id number]):  At least 20 percent but less than 40 percent impaired, limited or restricted Mobility: Walking and Moving Around Goal Status [phone]): At least 1 percent but less than 20 percent impaired, limited or restricted    Duncan Dull 09/22/2015, 9:19 AM Alben Deeds, PT DPT  [phone]

## 2015-09-22 NOTE — Progress Notes (Signed)
[facility]                                                                              Patient Demographics  [name], is a 82 y.o. male, DOB - 08-Sep-1923, [id number]  Admit date - 09/20/2015   Admitting Physician Vianne Bulls, MD  Outpatient Primary MD for the patient is  Melinda Crutch, MD  LOS -    Chief Complaint  Patient presents with  . Near Syncope      HPI On 09/20/15 by Dr. Christia Reading Opyd [name] is a 82 y.o. male and [employer] vetaran with PMH of coronary artery disease, carotid stenosis status post endarterectomy, COPD GOLD III, hypertension, and idiopathic peripheral neuropathy who presents to the ED following an episode at home where he was apparently asleep outside and difficult to arouse. Prior to the event, [name] had been in his usual state of health, denying fevers, chills, increased cough, malaise, nausea, vomiting, or diarrhea. He was sitting out in the sun earlier on the day of his presentation when his son went to check on him and found him to be apparently sleeping and very difficult to arouse. Patient was initially confused upon waking and has difficulty recalling the event. He does describe remembering that he heard his son and caretaker calling him to get up, but he felt unable to wake up or move. There is apparently full resolution of all symptoms over the course of several minutes and patient reported feeling back to his usual self. He denies any prior experience with similar symptoms. There is been no chest pain, palpitations, or headache, and there is been no recent change in his medications. Patient denies alcohol or illicit substance use, long distance travel, or sick contacts.  In ED, patient was found to be afebrile, saturating well on room air, and with vital signs stable. EKG demonstrates a sinus rhythm with first-degree AV block and noncontrast CT of the head and cervical spine are without acute abnormalities. Initial blood work includes a CMP which is  notable for serum creatinine of 2.95, up from an apparent baseline of 1.3. CBC features a microcytic anemia and borderline thrombocytopenia. Target cells are noted on the peripheral smear. Patient remained hemodynamically stable and asymptomatic in the emergency department and will be admitted under observation status for ongoing evaluation and management of suspected syncopal episode.   Assessment & Plan   Syncope/presyncopal episode -Unclear etiology at this time, suspected to be secondary to dehydration given patient's acute kidney injury -Will order repeat orthostatic vital signs -PT consulted and recommended home health vs SNF (depending on supervision and assistance)  Acute on chronic kidney disease, stage III -Upon admission, creatinine was 2.95, currently 2.39 -Baseline creatinine approximately 1.4-1.7 -Called patient's PCP for previous creatinine, however patient has labs done at the [place] -Will give patient gentle IVF, and monitor BMP  COPD -GOLD III -Currently on supplemental oxygen, no suggestion of exacerbation -Continue home medications, nebs every 4 hours as needed  Chronic combined systolic and diastolic heart failure -Echocardiogram 01/09/2015 showed EF [id number], grade 1 diastolic dysfunction -Currently appears euvolemic on exam, mildly dry -Continue Coreg -Lasix and ARB currently held due to renal  function  Coronary artery disease -Currently no complaints of chest pain, continue Lopid, aspirin, Coreg  Microcytic anemia -Baseline hemoglobin appears to be around 10, currently 9.6 -Drop in hemoglobin possibly dilutional, no active signs of bleeding -Patient does have a reported diagnosis of thalassemia  Code Status: Full  Family Communication: None at bedside  Disposition Plan: Admitted. Pending improvement in Cr.   Time Spent in minutes   30 minutes  Procedures  None  Consults   None  DVT Prophylaxis  Heparin  Lab Results  Component Value Date   PLT 147*  09/20/2015    Medications  Scheduled Meds: . aspirin EC  81 mg Oral Daily  . budesonide-formoterol  2 puff Inhalation BID  . carvedilol  12.5 mg Oral BID WC  . gemfibrozil  600 mg Oral BID AC  . heparin  5,000 Units Subcutaneous 3 times per day  . Linaclotide  145 mcg Oral Q breakfast  . pantoprazole  40 mg Oral Daily  . pregabalin  75 mg Oral BID  . sodium chloride flush  3 mL Intravenous Q12H  . tiotropium  18 mcg Inhalation Daily  . vitamin C  500 mg Oral Daily   Continuous Infusions: . sodium chloride 75 mL/hr at 09/22/15 1030   PRN Meds:.acetaminophen, ipratropium-albuterol, nitroGLYCERIN, ondansetron **OR** ondansetron (ZOFRAN) IV, polyethylene glycol  Antibiotics    Anti-infectives    None      Subjective:   [name] seen and examined today.  Patient states he's feeling much better today. Denies any dizziness, headache, shortness of breath, chest pain.   Objective:   Filed Vitals:   09/22/15 0334 09/22/15 0434 09/22/15 0845 09/22/15 1221  BP:  125/61 119/42 101/39  Pulse:  72 67 63  Temp:  97.3 F (36.3 C)  97.5 F (36.4 C)  TempSrc:    Oral  Resp:  16  16  Height:      Weight: 76.93 kg (169 lb 9.6 oz)     SpO2:  100%  96%    Wt Readings from Last 3 Encounters:  09/22/15 76.93 kg (169 lb 9.6 oz)  07/19/15 77.656 kg (171 lb 3.2 oz)  05/24/15 77.565 kg (171 lb)     Intake/Output Summary (Last 24 hours) at 09/22/15 1348 Last data filed at 09/22/15 1003  Gross per 24 hour  Intake    600 ml  Output   1501 ml  Net   -901 ml    Exam  General: Well developed, well nourished, NAD, appears stated age  HEENT: NCAT,  mucous membranes moist.   Cardiovascular: S1 S2 auscultated, no rubs, murmurs or gallops. Regular rate and rhythm.  Respiratory: Clear to auscultation bilaterally with equal chest rise  Abdomen: Soft, nontender, nondistended, + bowel sounds  Extremities: warm dry without cyanosis clubbing or edema  Neuro: AAOx3, hard of hearing,  otherwise nonfocal  Psych: Normal affect and demeanor  Data Review   Micro Results Recent Results (from the past 240 hour(s))  MRSA PCR Screening     Status: None   Collection Time: 09/20/15 10:42 PM  Result Value Ref Range Status   MRSA by PCR NEGATIVE NEGATIVE Final    Comment:        The GeneXpert MRSA Assay (FDA approved for NASAL specimens only), is one component of a comprehensive MRSA colonization surveillance program. It is not intended to diagnose MRSA infection nor to guide or monitor treatment for MRSA infections.   Urine culture     Status: None  Collection Time: 09/20/15 11:42 PM  Result Value Ref Range Status   Specimen Description URINE, CLEAN CATCH  Final   Special Requests NONE  Final   Culture MULTIPLE SPECIES PRESENT, SUGGEST RECOLLECTION  Final   Report Status 09/22/2015 FINAL  Final    Radiology Reports X-ray Chest Pa And Lateral  09/20/2015  CLINICAL DATA:  Syncope and weakness.  COPD. EXAM: CHEST  2 VIEW COMPARISON:  01/29/2015. FINDINGS: The heart size and mediastinal contours are within normal limits. Both lungs are clear. The visualized skeletal structures are unremarkable. Hyperinflation without pneumothorax. Surgical clips RIGHT neck stable. Thoracic atherosclerosis. Thoracic spine degenerative change. Similar appearance to priors. IMPRESSION: COPD, no active disease. Electronically Signed   By: Staci Righter M.D.   On: 09/20/2015 22:18   Ct Head Wo Contrast  09/20/2015  CLINICAL DATA:  82 year old male with fall and trauma to the head. EXAM: CT HEAD WITHOUT CONTRAST CT CERVICAL SPINE WITHOUT CONTRAST TECHNIQUE: Multidetector CT imaging of the head and cervical spine was performed following the standard protocol without intravenous contrast. Multiplanar CT image reconstructions of the cervical spine were also generated. COMPARISON:  CT dated 11/20/2013 FINDINGS: CT HEAD FINDINGS There is slight prominence of the ventricles and sulci compatible with  age-related volume loss. Mild periventricular and deep white matter hypodensities represent chronic microvascular ischemic changes. Small old left basal ganglia lacunar infarct. There is no intracranial hemorrhage. No mass effect or midline shift identified. There is partial opacification of the right frontal sinus. No air-fluid level. The remainder of the paranasal sinuses and mastoid air cells are well aerated. The calvarium is intact. CT CERVICAL SPINE FINDINGS There is no acute fracture or subluxation of the cervical spine.There is osteopenia with multilevel degenerative changes. There is disc desiccation and narrowing most prominent at C5-C6, and C6-C7.The odontoid and spinous processes are intact.There is normal anatomic alignment of the C1-C2 lateral masses. The visualized soft tissues appear unremarkable. Multiple surgical clips noted in the right side of the neck possibly related to carotid endarterectomy. Right carotid bulb calcific plaques noted. IMPRESSION: No acute intracranial hemorrhage. Mild age-related atrophy and chronic microvascular ischemic disease. No acute/traumatic cervical spine pathology. Electronically Signed   By: Anner Crete M.D.   On: 09/20/2015 19:08   Ct Cervical Spine Wo Contrast  09/20/2015  CLINICAL DATA:  82 year old male with fall and trauma to the head. EXAM: CT HEAD WITHOUT CONTRAST CT CERVICAL SPINE WITHOUT CONTRAST TECHNIQUE: Multidetector CT imaging of the head and cervical spine was performed following the standard protocol without intravenous contrast. Multiplanar CT image reconstructions of the cervical spine were also generated. COMPARISON:  CT dated 11/20/2013 FINDINGS: CT HEAD FINDINGS There is slight prominence of the ventricles and sulci compatible with age-related volume loss. Mild periventricular and deep white matter hypodensities represent chronic microvascular ischemic changes. Small old left basal ganglia lacunar infarct. There is no intracranial  hemorrhage. No mass effect or midline shift identified. There is partial opacification of the right frontal sinus. No air-fluid level. The remainder of the paranasal sinuses and mastoid air cells are well aerated. The calvarium is intact. CT CERVICAL SPINE FINDINGS There is no acute fracture or subluxation of the cervical spine.There is osteopenia with multilevel degenerative changes. There is disc desiccation and narrowing most prominent at C5-C6, and C6-C7.The odontoid and spinous processes are intact.There is normal anatomic alignment of the C1-C2 lateral masses. The visualized soft tissues appear unremarkable. Multiple surgical clips noted in the right side of the neck possibly related to carotid  endarterectomy. Right carotid bulb calcific plaques noted. IMPRESSION: No acute intracranial hemorrhage. Mild age-related atrophy and chronic microvascular ischemic disease. No acute/traumatic cervical spine pathology. Electronically Signed   By: Anner Crete M.D.   On: 09/20/2015 19:08    CBC  Recent Labs Lab 09/20/15 1833  WBC 7.3  HGB 9.6*  HCT 31.1*  PLT 147*  MCV 62.3*  MCH 19.2*  MCHC 30.9  RDW 16.8*  LYMPHSABS 1.5  MONOABS 0.4  EOSABS 0.2  BASOSABS 0.1    Chemistries   Recent Labs Lab 09/20/15 1833 09/21/15 0501  NA 135 143  K 4.9 4.1  CL 94* 104  CO2 24 25  GLUCOSE 123* 118*  BUN 60* 56*  CREATININE 2.95* 2.39*  CALCIUM 8.8* 8.6*  AST 17 15  ALT 10* 9*  ALKPHOS 85 73  BILITOT 0.8 0.7   ------------------------------------------------------------------------------------------------------------------ estimated creatinine clearance is 22.8 mL/min (by C-G formula based on Cr of 2.39). ------------------------------------------------------------------------------------------------------------------ No results for input(s): HGBA1C in the last 72 hours. ------------------------------------------------------------------------------------------------------------------ No  results for input(s): CHOL, HDL, LDLCALC, TRIG, CHOLHDL, LDLDIRECT in the last 72 hours. ------------------------------------------------------------------------------------------------------------------ No results for input(s): TSH, T4TOTAL, T3FREE, THYROIDAB in the last 72 hours.  Invalid input(s): FREET3 ------------------------------------------------------------------------------------------------------------------ No results for input(s): VITAMINB12, FOLATE, FERRITIN, TIBC, IRON, RETICCTPCT in the last 72 hours.  Coagulation profile No results for input(s): INR, PROTIME in the last 168 hours.  No results for input(s): DDIMER in the last 72 hours.  Cardiac Enzymes No results for input(s): CKMB, TROPONINI, MYOGLOBIN in the last 168 hours.  Invalid input(s): CK ------------------------------------------------------------------------------------------------------------------ Invalid input(s): POCBNP    Cyris Maalouf D.O. on 09/22/2015 at 1:48 PM  Between 7am to 7pm - Pager - [phone]  After 7pm go to [email or web address] - password [email or web address]  And look for the night coverage person covering for me after hours  [facility] Office  [phone]

## 2015-09-22 NOTE — Evaluation (Signed)
Occupational Therapy Evaluation Patient Details Name: [name] MRN: [id number] DOB: July 20, 1924 Today's Date: 09/22/2015    History of Present Illness 82 yo male admitted with syncope. pt with recent fall at son's house PMH: Deaf L ear, MI, COPD, prostate CA, essential HTN, CAD, hx of CVA, arthritis, GERD, R foot drop, vertigo, idiopathic peripheral neuropathy   Clinical Impression   PT admitted with syncope. Pt currently with functional limitiations due to the deficits listed below (see OT problem list). PTA living at home alone with an aid daily to assist patient.  Pt will benefit from skilled OT to increase their independence and safety with adls and balance to allow discharge SNF vs home with more assistance. Pt is high fall risk and LOB during evaluation.     Follow Up Recommendations  SNF;Other (comment) (or incr support at home for safety)    Equipment Recommendations  None recommended by OT    Recommendations for Other Services       Precautions / Restrictions Precautions Precautions: Fall Precaution Comments: bil LE AFO shoes      Mobility Bed Mobility Overal bed mobility: Independent                Transfers Overall transfer level: Needs assistance Equipment used: Rolling walker (2 wheeled) Transfers: Sit to/from Stand Sit to Stand: Min guard              Balance                                            ADL Overall ADL's : Needs assistance/impaired Eating/Feeding: Independent Eating/Feeding Details (indicate cue type and reason): able to open all containers Grooming: Wash/dry hands;Min guard;Standing Grooming Details (indicate cue type and reason): pt with LOB at sink with bil UE required to self correct LOB. Pt with RW present. Pt reports "thats normal"             Lower Body Dressing: Modified independent;Sit to/from stand Lower Body Dressing Details (indicate cue type and reason): able to don AFO and even tied tennis  shoes Toilet Transfer: Min guard           Functional mobility during ADLs: Min guard General ADL Comments: pt with lob during session with head turn at sink level.      Vision     Perception     Praxis      Pertinent Vitals/Pain Pain Assessment: No/denies pain     Hand Dominance Right   Extremity/Trunk Assessment Upper Extremity Assessment Upper Extremity Assessment: Overall WFL for tasks assessed   Lower Extremity Assessment Lower Extremity Assessment: Defer to PT evaluation;RLE deficits/detail;LLE deficits/detail RLE Deficits / Details: AFO LLE Deficits / Details: AFO   Cervical / Trunk Assessment Cervical / Trunk Assessment: Normal   Communication Communication Communication: HOH   Cognition Arousal/Alertness: Awake/alert Behavior During Therapy: WFL for tasks assessed/performed Overall Cognitive Status: Impaired/Different from baseline Area of Impairment: Safety/judgement         Safety/Judgement: Decreased awareness of deficits     General Comments: pt reports no falls but with further questioning reports the cut on forearm is from bumping the bathroom wall. Further evidence of balance deficits but pt denies falls due to not actually falling to the ground. Pt does state "i did fall in my sons living room last week but thats it"   General Comments  Exercises       Shoulder Instructions      Home Living Family/patient expects to be discharged to:: Private residence Living Arrangements: Alone Available Help at Discharge: Available PRN/intermittently (aide from 7 A- 7P) Type of Home: House Home Access: Stairs to enter [employer] of Steps: 3   Home Layout: One level     Bathroom Shower/Tub: Teacher, early years/pre: Standard     Home Equipment: Environmental consultant - 2 wheels;Walker - 4 wheels          Prior Functioning/Environment Level of Independence: Needs assistance  Gait / Transfers Assistance Needed: walks with  RW ADL's / Homemaking Assistance Needed: aid present 7A- 7P to assist with house hold and shopping        OT Diagnosis: Generalized weakness   OT Problem List: Decreased strength;Decreased activity tolerance;Impaired balance (sitting and/or standing);Decreased safety awareness;Decreased knowledge of precautions   OT Treatment/Interventions: Self-care/ADL training;Therapeutic exercise;DME and/or AE instruction;Therapeutic activities;Patient/family education;Balance training    OT Goals(Current goals can be found in the care plan section) Acute Rehab OT Goals Patient Stated Goal: to go home today OT Goal Formulation: With patient Time For Goal Achievement: 10/06/15 Potential to Achieve Goals: Good  OT Frequency: Min 2X/week   Barriers to D/C: Decreased caregiver support  aide available 7 - 7pm daily. otherwise alone at home       Co-evaluation              End of Session Equipment Utilized During Treatment: Gait belt;Rolling walker Nurse Communication: Mobility status;Precautions  Activity Tolerance: Patient tolerated treatment well Patient left: in chair;with call bell/phone within reach;Other (comment) (Tech informed of chair alarm needed. Alarm placed on chair)   Time: [id number] OT Time Calculation (min): 15 min Charges:  OT General Charges $OT Visit: 1 Procedure OT Evaluation $OT Eval Moderate Complexity: 1 Procedure G-Codes: OT G-codes **NOT FOR INPATIENT CLASS** Functional Assessment Tool Used: clinical judgement Functional Limitation: Self care Self Care Current Status [id number]): At least 20 percent but less than 40 percent impaired, limited or restricted Self Care Goal Status [id number]): At least 1 percent but less than 20 percent impaired, limited or restricted Self Care Discharge Status [phone]): At least 20 percent but less than 40 percent impaired, limited or restricted  Peri Maris 09/22/2015, 8:30 AM   Jeri Modena   OTR/L Pager: [phone] Office:  [phone] .

## 2015-09-22 NOTE — Progress Notes (Signed)
OT NOTE  Previously documented by NT : wallet was taken home by son. During OT evaluation - Wallet was discovered in patient's shoe. Wallet was placed in white clothing bag in room closet with RN Janett Billow notified to alert family.   Jeri Modena   OTR/L Pager: [phone] Office: [phone] .

## 2015-09-23 LAB — BASIC METABOLIC PANEL
Anion gap: 7 (ref 5–15)
BUN: 38 mg/dL — AB (ref 6–20)
CHLORIDE: 107 mmol/L (ref 101–111)
CO2: 26 mmol/L (ref 22–32)
CREATININE: 1.48 mg/dL — AB (ref 0.61–1.24)
Calcium: 8.8 mg/dL — ABNORMAL LOW (ref 8.9–10.3)
GFR calc non Af Amer: 40 mL/min — ABNORMAL LOW (ref >60)
GFR, EST AFRICAN AMERICAN: 47 mL/min — AB (ref >60)
GLUCOSE: 109 mg/dL — AB (ref 65–99)
POTASSIUM: 4.4 mmol/L (ref 3.5–5.1)
SODIUM: 140 mmol/L (ref 135–145)

## 2015-09-23 LAB — CBC
HCT: 26.8 % — ABNORMAL LOW (ref 39.0–52.0)
Hemoglobin: 8.4 g/dL — ABNORMAL LOW (ref 13.0–17.0)
MCH: 19.3 pg — ABNORMAL LOW (ref 26.0–34.0)
MCHC: 31.3 g/dL (ref 30.0–36.0)
MCV: 61.5 fL — ABNORMAL LOW (ref 78.0–100.0)
PLATELETS: 123 10*3/uL — AB (ref 150–400)
RBC: 4.36 MIL/uL (ref 4.22–5.81)
RDW: 16.6 % — AB (ref 11.5–15.5)
WBC: 5.3 10*3/uL (ref 4.0–10.5)

## 2015-09-23 LAB — GLUCOSE, CAPILLARY: Glucose-Capillary: 98 mg/dL (ref 65–99)

## 2015-09-23 MED ORDER — TIOTROPIUM BROMIDE MONOHYDRATE 18 MCG IN CAPS: 18.0000 ug | ORAL_CAPSULE | Freq: Every day | RESPIRATORY_TRACT | Status: DC

## 2015-09-23 NOTE — Progress Notes (Signed)
Physical Therapy Treatment Patient Details Name: [name] MRN: [id number] DOB: 1924-06-23 Today's Date: 09/23/2015    History of Present Illness 82 yo male admitted with syncope. pt with recent fall at son's house PMH: Deaf L ear, MI, COPD, prostate CA, essential HTN, CAD, hx of CVA, arthritis, GERD, R foot drop, vertigo, idiopathic peripheral neuropathy    PT Comments    Patient seen for step training in preparation for d/c home. Patient ambulated in hall and performed stair negotiation with min guard/assist for safety. Educated on importance of assist for safe mobility at home.   Follow Up Recommendations  Home health PT;Supervision/Assistance - 24 hour      Equipment Recommendations  None recommended by PT    Recommendations for Other Services       Precautions / Restrictions Precautions Precautions: Fall Precaution Comments: bil LE AFO shoes Restrictions Weight Bearing Restrictions: No    Mobility  Bed Mobility Overal bed mobility: Independent             General bed mobility comments: received in chair (Simultaneous filing. User may not have seen previous data.)  Transfers Overall transfer level: Needs assistance Equipment used: Rolling walker (2 wheeled) Transfers: Sit to/from Stand Sit to Stand: Min guard         General transfer comment: No physical assist requried, min guard for stability  Ambulation/Gait Ambulation/Gait assistance: Supervision Ambulation Distance (Feet): 150 Feet Assistive device: Rolling walker (2 wheeled) Gait Pattern/deviations: Step-through pattern;Decreased stride length;Steppage;Narrow base of support;Drifts right/left;Trunk flexed Gait velocity: decreased Gait velocity interpretation: Below normal speed for age/gender General Gait Details: decreased gait speed   Stairs Stairs: Yes Stairs assistance: Min guard Stair Management: One rail Right;Step to pattern Number of Stairs: 3 General stair comments: Modest  instability on the stairs, instructed to have   Wheelchair Mobility    Modified Rankin (Stroke Patients Only)       Balance                                    Cognition Arousal/Alertness: Awake/alert Behavior During Therapy: WFL for tasks assessed/performed Overall Cognitive Status: Impaired/Different from baseline Area of Impairment: Safety/judgement         Safety/Judgement: Decreased awareness of deficits          Exercises      General Comments General comments (skin integrity, edema, etc.): educated regarding safety with stair negotiation upon discharge      Pertinent Vitals/Pain Pain Assessment: No/denies pain    Home Living                      Prior Function            PT Goals (current goals can now be found in the care plan section) Acute Rehab PT Goals Patient Stated Goal: to go home today PT Goal Formulation: With patient Time For Goal Achievement: 10/06/15 Potential to Achieve Goals: Good Progress towards PT goals: Progressing toward goals    Frequency  Min 3X/week    PT Plan Current plan remains appropriate    Co-evaluation             End of Session Equipment Utilized During Treatment: Gait belt Activity Tolerance: Patient tolerated treatment well Patient left: in chair;with call bell/phone within reach     Time: [id number] PT Time Calculation (min) (ACUTE ONLY): 13 min  Charges:  $Gait Training: 8-22 mins  G CodesDuncan Dull October 07, 2015, 1:48 PM  Alben Deeds, [place] DPT  [phone]

## 2015-09-23 NOTE — Care Management Note (Signed)
Case Management Note  Patient Details  Name: [name] MRN: [id number] Date of Birth: Nov 17, 1923  Subjective/Objective:      Admitted with Near Syncope              Action/Plan: Patient lives at home alone with private caregivers from 7 am - 7 pm; patient is thinking about increasing their hours until his bedtime. They do his grocery shopping, cooking, cleaning and takes him to his apts. He has a walker / DME; No home oxygen at this time. [place] choices offered for HHPT, patient chose [facility]. Butch Penny with [facility] called for arrangements. Has BCBS with prescription drug coverage, pharmacy oc choice is [facility], no problem getting his medication.  Expected Discharge Date:    09/23/2015              Expected Discharge Plan:  [place]  Discharge planning Services  CM Consult     Choice offered to:  Patient  HH Arranged:  PT [place]:  [place]  Status of Service:  Completed, signed off  Sherrilyn Rist [id number] 09/23/2015, 11:20 AM

## 2015-09-23 NOTE — Discharge Summary (Signed)
Pt got discharged to home, discharge instructions provided and patient showed understanding to it, IV taken out,Telemonitor DC,pt left unit in wheelchair with all of the belongings accompanied with a family member  

## 2015-09-23 NOTE — Discharge Summary (Signed)
Physician Discharge Summary  [name] [id number] DOB: Apr 28, 1924 DOA: 09/20/2015  PCP:  Melinda Crutch, MD  Admit date: 09/20/2015 Discharge date: 09/23/2015  Time spent: 45 minutes  Recommendations for Outpatient Follow-up:  Patient will be discharged to home with home health PT.  Patient will need to follow up with primary care provider within one week of discharge, repeat CBC and BMP.  Patient should continue medications as prescribed.  Patient should follow a heart healthy diet.   Discharge Diagnoses:  Syncope/presyncopal episode Acute on chronic kidney disease, stage III COPD Chronic combined systolic and diastolic heart failure Coronary artery disease Microcytic anemia  Discharge Condition: Stable  Diet recommendation: Heart healthy  Filed Weights   09/21/15 0602 09/22/15 0334 09/23/15 0348  Weight: 76.34 kg (168 lb 4.8 oz) 76.93 kg (169 lb 9.6 oz) 77.384 kg (170 lb 9.6 oz)    History of present illness:  On 09/20/15 by Dr. Christia Reading Opyd [name] is a 82 y.o. male and [employer] vet[employer] with PMH of coronary artery disease, carotid stenosis status post endarterectomy, COPD GOLD III, hypertension, and idiopathic peripheral neuropathy who presents to the ED following an episode at home where he was apparently asleep outside and difficult to arouse. Prior to the event, Mr. Loo had been in his usual state of health, denying fevers, chills, increased cough, malaise, nausea, vomiting, or diarrhea. He was sitting out in the sun earlier on the day of his presentation when his son went to check on him and found him to be apparently sleeping and very difficult to arouse. Patient was initially confused upon waking and has difficulty recalling the event. He does describe remembering that he heard his son and caretaker calling him to get up, but he felt unable to wake up or move. There is apparently full resolution of all symptoms over the course of several minutes and patient reported feeling back to his  usual self. He denies any prior experience with similar symptoms. There is been no chest pain, palpitations, or headache, and there is been no recent change in his medications. Patient denies alcohol or illicit substance use, long distance travel, or sick contacts.  In ED, patient was found to be afebrile, saturating well on room air, and with vital signs stable. EKG demonstrates a sinus rhythm with first-degree AV block and noncontrast CT of the head and cervical spine are without acute abnormalities. Initial blood work includes a CMP which is notable for serum creatinine of 2.95, up from an apparent baseline of 1.3. CBC features a microcytic anemia and borderline thrombocytopenia. Target cells are noted on the peripheral smear. Patient remained hemodynamically stable and asymptomatic in the emergency department and will be admitted under observation status for ongoing evaluation and management of suspected syncopal episode.   Hospital Course:  Syncope/presyncopal episode -Unclear etiology at this time, suspected to be secondary to dehydration given patient's acute kidney injury -Orthostatic vitals unremarkable -PT consulted and recommended home health vs SNF (depending on supervision and assistance) -Had a long conversation with patient's daughter, they can arrange for supervision and are looking into possibly assisted living.  Acute on chronic kidney disease, stage III -Upon admission, creatinine was 2.95, currently 1.48 after IVF -Baseline creatinine approximately 1.4-1.7 -Called patient's PCP for previous creatinine, however patient has labs done at the [place] -Repeat BMP in one week  COPD -GOLD III -Currently on supplemental oxygen, no suggestion of exacerbation -Continue home medications  Chronic combined systolic and diastolic heart failure -Echocardiogram 01/09/2015 showed EF  [id number], grade 1 diastolic dysfunction -Currently appears euvolemic on exam, mildly dry -Continue Coreg -Lasix  and ARB currently held due to renal function- but may be restarted upon discharge  Coronary artery disease -Currently no complaints of chest pain, continue Lopid, aspirin, Coreg  Microcytic anemia -Baseline hemoglobin appears to be around 10, currently 8.4 -Drop in hemoglobin possibly dilutional, no active signs of bleeding -Patient does have a reported diagnosis of thalassemia -Patient will need repeat CBC in one week  Procedures  None  Consults  None  Discharge Exam: Filed Vitals:   09/23/15 0034 09/23/15 0348  BP: 126/45 134/49  Pulse: 65 66  Temp: 97.7 F (36.5 C) 98.5 F (36.9 C)  Resp: 16 16    Exam  General: Well developed, well nourished, NAD  HEENT: NCAT, mucous membranes moist.   Cardiovascular: S1 S2 auscultated, RRR  Respiratory: Clear to auscultation bilaterally   Abdomen: Soft, nontender, nondistended, + bowel sounds  Extremities: warm dry without cyanosis clubbing or edema  Neuro: AAOx3, hard of hearing, otherwise nonfocal  Psych: Normal affect and demeanor, pleasant  Discharge Instructions      Discharge Instructions    Discharge instructions    Complete by:  As directed   Patient will be discharged to home with home health PT.  Patient will need to follow up with primary care provider within one week of discharge, repeat CBC and BMP.  Patient should continue medications as prescribed.  Patient should follow a heart healthy diet.            Medication List    TAKE these medications        aspirin 81 MG tablet  Take 81 mg by mouth daily.     carvedilol 12.5 MG tablet  Commonly known as:  COREG  TAKE 1 TABLET TWICE DAILY WITH A MEAL     furosemide 20 MG tablet  Commonly known as:  LASIX  TAKE 1 TABLET BY MOUTH EVERY DAY     gemfibrozil 600 MG tablet  Commonly known as:  LOPID  Take 600 mg by mouth 2 (two) times daily before a meal. Reported on 09/20/2015     Glycopyrrolate-Formoterol 9-4.8 MCG/ACT Aero  Commonly known as:   BEVESPI AEROSPHERE  Inhale 2 puffs into the lungs every 12 (twelve) hours.     LINZESS 145 MCG Caps capsule  Generic drug:  Linaclotide  Take 1 capsule by mouth daily with breakfast.     losartan 25 MG tablet  Commonly known as:  COZAAR  Take 1 tablet (25 mg total) by mouth daily.     meclizine 25 MG tablet  Commonly known as:  ANTIVERT  Take 1 tablet (25 mg total) by mouth daily as needed for dizziness.     MIRALAX powder  Generic drug:  polyethylene glycol powder  Take 17 g by mouth daily as needed. For constipation     MYRBETRIQ 50 MG Tb24 tablet  Generic drug:  mirabegron ER  Take 50 mg by mouth daily.     NITROSTAT 0.4 MG SL tablet  Generic drug:  nitroGLYCERIN  PLACE 1 TABLET (0.4 MG TOTAL) UNDER THE TONGUE EVERY 5 (FIVE) MINUTES AS NEEDED. FOR CHEST PAIN     omeprazole 40 MG capsule  Commonly known as:  PRILOSEC  Take 1 capsule (40 mg total) by mouth daily.     pregabalin 75 MG capsule  Commonly known as:  LYRICA  Take 75 mg by mouth 2 (two) times daily.  simvastatin 10 MG tablet  Commonly known as:  ZOCOR  TAKE 1 TABLET BY MOUTH EVERY DAY     SYMBICORT 160-4.5 MCG/ACT inhaler  Generic drug:  budesonide-formoterol  INHALE 2 PUFFS FIRST THING IN THE MORNING AND THEN TAKE 2 PUFFS 12 HOURS LATER     tiotropium 18 MCG inhalation capsule  Commonly known as:  SPIRIVA  Place 1 capsule (18 mcg total) into inhaler and inhale daily.     TYLENOL 325 MG tablet  Generic drug:  acetaminophen  Take 650 mg by mouth every 6 (six) hours as needed for mild pain.     VENTOLIN HFA 108 (90 Base) MCG/ACT inhaler  Generic drug:  albuterol  INHALE 2 PUFFS INTO THE LUNGS EVERY 4 HOURS AS NEEDED FOR WHEEZING OR SHORTNESS OF BREATH     vitamin C 500 MG tablet  Commonly known as:  ASCORBIC ACID  Take 500 mg by mouth daily.       Allergies  Allergen Reactions  . Amoxicillin     REACTION: unspecified  . Clopidogrel Bisulfate Hives    REACTION: red blue, black blotches  .  Dilaudid [Hydromorphone Hcl] Hives    Hallucinations, little green men working on the building  . Doxycycline Hyclate   . Penicillins Hives    Has patient had a PCN reaction causing immediate rash, facial/tongue/throat swelling, SOB or lightheadedness with hypotension: Yes Has patient had a PCN reaction causing severe rash involving mucus membranes or skin necrosis: No Has patient had a PCN reaction that required hospitalization No Has patient had a PCN reaction occurring within the last 10 years: No If all of the above answers are "NO", then may proceed with Cephalosporin use.   . Sulfa Antibiotics Other (See Comments)    Nightmares, hallucinations  . Sulfamethoxazole Other (See Comments)    Nightmares, hallucinations  . Sulfonamide Derivatives Other (See Comments)    Nightmares, hallucinations   Follow-up Information    Follow up with  Melinda Crutch, MD. Schedule an appointment as soon as possible for a visit in 1 week.   Specialty:  Family Medicine   Why:  Hospital follow up, 1 week, Repeat CBC and BMP   Contact information:   [place] [id number] [phone]        The results of significant diagnostics from this hospitalization (including imaging, microbiology, ancillary and laboratory) are listed below for reference.    Significant Diagnostic Studies: X-ray Chest Pa And Lateral  09/20/2015  CLINICAL DATA:  Syncope and weakness.  COPD. EXAM: CHEST  2 VIEW COMPARISON:  01/29/2015. FINDINGS: The heart size and mediastinal contours are within normal limits. Both lungs are clear. The visualized skeletal structures are unremarkable. Hyperinflation without pneumothorax. Surgical clips RIGHT neck stable. Thoracic atherosclerosis. Thoracic spine degenerative change. Similar appearance to priors. IMPRESSION: COPD, no active disease. Electronically Signed   By: Staci Righter M.D.   On: 09/20/2015 22:18   Ct Head Wo Contrast  09/20/2015  CLINICAL DATA:  82 year old male  with fall and trauma to the head. EXAM: CT HEAD WITHOUT CONTRAST CT CERVICAL SPINE WITHOUT CONTRAST TECHNIQUE: Multidetector CT imaging of the head and cervical spine was performed following the standard protocol without intravenous contrast. Multiplanar CT image reconstructions of the cervical spine were also generated. COMPARISON:  CT dated 11/20/2013 FINDINGS: CT HEAD FINDINGS There is slight prominence of the ventricles and sulci compatible with age-related volume loss. Mild periventricular and deep white matter hypodensities represent chronic microvascular ischemic changes. Small  old left basal ganglia lacunar infarct. There is no intracranial hemorrhage. No mass effect or midline shift identified. There is partial opacification of the right frontal sinus. No air-fluid level. The remainder of the paranasal sinuses and mastoid air cells are well aerated. The calvarium is intact. CT CERVICAL SPINE FINDINGS There is no acute fracture or subluxation of the cervical spine.There is osteopenia with multilevel degenerative changes. There is disc desiccation and narrowing most prominent at C5-C6, and C6-C7.The odontoid and spinous processes are intact.There is normal anatomic alignment of the C1-C2 lateral masses. The visualized soft tissues appear unremarkable. Multiple surgical clips noted in the right side of the neck possibly related to carotid endarterectomy. Right carotid bulb calcific plaques noted. IMPRESSION: No acute intracranial hemorrhage. Mild age-related atrophy and chronic microvascular ischemic disease. No acute/traumatic cervical spine pathology. Electronically Signed   By: Anner Crete M.D.   On: 09/20/2015 19:08   Ct Cervical Spine Wo Contrast  09/20/2015  CLINICAL DATA:  82 year old male with fall and trauma to the head. EXAM: CT HEAD WITHOUT CONTRAST CT CERVICAL SPINE WITHOUT CONTRAST TECHNIQUE: Multidetector CT imaging of the head and cervical spine was performed following the standard  protocol without intravenous contrast. Multiplanar CT image reconstructions of the cervical spine were also generated. COMPARISON:  CT dated 11/20/2013 FINDINGS: CT HEAD FINDINGS There is slight prominence of the ventricles and sulci compatible with age-related volume loss. Mild periventricular and deep white matter hypodensities represent chronic microvascular ischemic changes. Small old left basal ganglia lacunar infarct. There is no intracranial hemorrhage. No mass effect or midline shift identified. There is partial opacification of the right frontal sinus. No air-fluid level. The remainder of the paranasal sinuses and mastoid air cells are well aerated. The calvarium is intact. CT CERVICAL SPINE FINDINGS There is no acute fracture or subluxation of the cervical spine.There is osteopenia with multilevel degenerative changes. There is disc desiccation and narrowing most prominent at C5-C6, and C6-C7.The odontoid and spinous processes are intact.There is normal anatomic alignment of the C1-C2 lateral masses. The visualized soft tissues appear unremarkable. Multiple surgical clips noted in the right side of the neck possibly related to carotid endarterectomy. Right carotid bulb calcific plaques noted. IMPRESSION: No acute intracranial hemorrhage. Mild age-related atrophy and chronic microvascular ischemic disease. No acute/traumatic cervical spine pathology. Electronically Signed   By: Anner Crete M.D.   On: 09/20/2015 19:08    Microbiology: Recent Results (from the past 240 hour(s))  MRSA PCR Screening     Status: None   Collection Time: 09/20/15 10:42 PM  Result Value Ref Range Status   MRSA by PCR NEGATIVE NEGATIVE Final    Comment:        The GeneXpert MRSA Assay (FDA approved for NASAL specimens only), is one component of a comprehensive MRSA colonization surveillance program. It is not intended to diagnose MRSA infection nor to guide or monitor treatment for MRSA infections.   Urine  culture     Status: None   Collection Time: 09/20/15 11:42 PM  Result Value Ref Range Status   Specimen Description URINE, CLEAN CATCH  Final   Special Requests NONE  Final   Culture MULTIPLE SPECIES PRESENT, SUGGEST RECOLLECTION  Final   Report Status 09/22/2015 FINAL  Final     Labs: Basic Metabolic Panel:  Recent Labs Lab 09/20/15 1833 09/21/15 0501 09/23/15 0553  NA 135 143 140  K 4.9 4.1 4.4  CL 94* 104 107  CO2 24 25 26   GLUCOSE 123* 118* 109*  BUN 60* 56* 38*  CREATININE 2.95* 2.39* 1.48*  CALCIUM 8.8* 8.6* 8.8*   Liver Function Tests:  Recent Labs Lab 09/20/15 1833 09/21/15 0501  AST 17 15  ALT 10* 9*  ALKPHOS 85 73  BILITOT 0.8 0.7  PROT 6.9 5.8*  ALBUMIN 3.5 2.9*   No results for input(s): LIPASE, AMYLASE in the last 168 hours. No results for input(s): AMMONIA in the last 168 hours. CBC:  Recent Labs Lab 09/20/15 1833 09/23/15 0553  WBC 7.3 5.3  NEUTROABS 5.1  --   HGB 9.6* 8.4*  HCT 31.1* 26.8*  MCV 62.3* 61.5*  PLT 147* 123*   Cardiac Enzymes: No results for input(s): CKTOTAL, CKMB, CKMBINDEX, TROPONINI in the last 168 hours. BNP: BNP (last 3 results)  Recent Labs  01/08/15 0729 01/08/15 1000 09/20/15 1833  BNP 569.6* 439.7* 196.1*    ProBNP (last 3 results) No results for input(s): PROBNP in the last 8760 hours.  CBG:  Recent Labs Lab 09/21/15 0608 09/21/15 0754 09/22/15 0635 09/23/15 0600  GLUCAP 103* 108* 123* 98       Signed:  Taimi Towe  [facility] 09/23/2015, 10:46 AM

## 2015-09-23 NOTE — Discharge Instructions (Signed)

## 2015-09-28 ENCOUNTER — Telehealth: Payer: Self-pay | Admitting: *Deleted

## 2015-09-28 NOTE — Telephone Encounter (Signed)
Initiated PA for Symbicort with [facility].  [phone] Pt ID: [id number]  Submitted for review. [facility] did state preferred meds were Advair, Breo  Or Dulera.  Will await repsonse.

## 2015-09-30 NOTE — Telephone Encounter (Signed)
Symbicort was denied. States pt must try Advair, Breo, or Dulera.

## 2015-09-30 NOTE — Telephone Encounter (Signed)
[place] for [employer] 200 2bid

## 2015-10-01 ENCOUNTER — Telehealth: Payer: Self-pay | Admitting: Internal Medicine

## 2015-10-01 MED ORDER — MOMETASONE FURO-FORMOTEROL FUM 200-5 MCG/ACT IN AERO: 2.0000 | INHALATION_SPRAY | Freq: Two times a day (BID) | RESPIRATORY_TRACT | Status: DC

## 2015-10-01 NOTE — Telephone Encounter (Signed)
Called spoke with Juliann Pulse from [facility]. Made aware we changed pt symbicort to dulera. Nothing further needed

## 2015-10-01 NOTE — Telephone Encounter (Signed)
Pt is aware of medication change. Rx has been sent in. Nothing further was needed. 

## 2015-10-14 ENCOUNTER — Encounter (HOSPITAL_COMMUNITY): Payer: Self-pay | Admitting: Emergency Medicine

## 2015-10-14 ENCOUNTER — Emergency Department (HOSPITAL_COMMUNITY): Payer: Medicare Other

## 2015-10-14 ENCOUNTER — Inpatient Hospital Stay (HOSPITAL_COMMUNITY)
Admission: EM | Admit: 2015-10-14 | Discharge: 2015-10-23 | DRG: 389 | Disposition: A | Discharge status: Home Health | Payer: Medicare Other | Attending: Internal Medicine | Admitting: Internal Medicine

## 2015-10-14 DIAGNOSIS — K5660 Unspecified intestinal obstruction: Secondary | ICD-10-CM | POA: Diagnosis present

## 2015-10-14 DIAGNOSIS — R269 Unspecified abnormalities of gait and mobility: Secondary | ICD-10-CM

## 2015-10-14 DIAGNOSIS — E7800 Pure hypercholesterolemia, unspecified: Secondary | ICD-10-CM | POA: Diagnosis present

## 2015-10-14 DIAGNOSIS — R197 Diarrhea, unspecified: Secondary | ICD-10-CM | POA: Diagnosis present

## 2015-10-14 DIAGNOSIS — I6529 Occlusion and stenosis of unspecified carotid artery: Secondary | ICD-10-CM | POA: Diagnosis present

## 2015-10-14 DIAGNOSIS — Z882 Allergy status to sulfonamides status: Secondary | ICD-10-CM

## 2015-10-14 DIAGNOSIS — H9192 Unspecified hearing loss, left ear: Secondary | ICD-10-CM | POA: Diagnosis present

## 2015-10-14 DIAGNOSIS — K567 Ileus, unspecified: Secondary | ICD-10-CM | POA: Diagnosis present

## 2015-10-14 DIAGNOSIS — R112 Nausea with vomiting, unspecified: Secondary | ICD-10-CM | POA: Diagnosis not present

## 2015-10-14 DIAGNOSIS — Z79899 Other long term (current) drug therapy: Secondary | ICD-10-CM

## 2015-10-14 DIAGNOSIS — D509 Iron deficiency anemia, unspecified: Secondary | ICD-10-CM | POA: Diagnosis present

## 2015-10-14 DIAGNOSIS — K219 Gastro-esophageal reflux disease without esophagitis: Secondary | ICD-10-CM | POA: Diagnosis present

## 2015-10-14 DIAGNOSIS — Z888 Allergy status to other drugs, medicaments and biological substances status: Secondary | ICD-10-CM

## 2015-10-14 DIAGNOSIS — Z87891 Personal history of nicotine dependence: Secondary | ICD-10-CM

## 2015-10-14 DIAGNOSIS — Z880 Allergy status to penicillin: Secondary | ICD-10-CM

## 2015-10-14 DIAGNOSIS — D569 Thalassemia, unspecified: Secondary | ICD-10-CM | POA: Diagnosis present

## 2015-10-14 DIAGNOSIS — M21371 Foot drop, right foot: Secondary | ICD-10-CM | POA: Diagnosis present

## 2015-10-14 DIAGNOSIS — I252 Old myocardial infarction: Secondary | ICD-10-CM

## 2015-10-14 DIAGNOSIS — I10 Essential (primary) hypertension: Secondary | ICD-10-CM | POA: Diagnosis present

## 2015-10-14 DIAGNOSIS — Z8546 Personal history of malignant neoplasm of prostate: Secondary | ICD-10-CM

## 2015-10-14 DIAGNOSIS — N183 Chronic kidney disease, stage 3 unspecified: Secondary | ICD-10-CM | POA: Diagnosis present

## 2015-10-14 DIAGNOSIS — I2510 Atherosclerotic heart disease of native coronary artery without angina pectoris: Secondary | ICD-10-CM | POA: Diagnosis present

## 2015-10-14 DIAGNOSIS — N179 Acute kidney failure, unspecified: Secondary | ICD-10-CM | POA: Diagnosis present

## 2015-10-14 DIAGNOSIS — Z9842 Cataract extraction status, left eye: Secondary | ICD-10-CM

## 2015-10-14 DIAGNOSIS — Z8673 Personal history of transient ischemic attack (TIA), and cerebral infarction without residual deficits: Secondary | ICD-10-CM

## 2015-10-14 DIAGNOSIS — Z7982 Long term (current) use of aspirin: Secondary | ICD-10-CM

## 2015-10-14 DIAGNOSIS — J449 Chronic obstructive pulmonary disease, unspecified: Secondary | ICD-10-CM | POA: Diagnosis present

## 2015-10-14 DIAGNOSIS — K56609 Unspecified intestinal obstruction, unspecified as to partial versus complete obstruction: Secondary | ICD-10-CM

## 2015-10-14 DIAGNOSIS — R159 Full incontinence of feces: Secondary | ICD-10-CM | POA: Diagnosis present

## 2015-10-14 DIAGNOSIS — I255 Ischemic cardiomyopathy: Secondary | ICD-10-CM | POA: Diagnosis present

## 2015-10-14 DIAGNOSIS — Z881 Allergy status to other antibiotic agents status: Secondary | ICD-10-CM

## 2015-10-14 DIAGNOSIS — R339 Retention of urine, unspecified: Secondary | ICD-10-CM | POA: Diagnosis present

## 2015-10-14 DIAGNOSIS — Z886 Allergy status to analgesic agent status: Secondary | ICD-10-CM

## 2015-10-14 DIAGNOSIS — I129 Hypertensive chronic kidney disease with stage 1 through stage 4 chronic kidney disease, or unspecified chronic kidney disease: Secondary | ICD-10-CM | POA: Diagnosis present

## 2015-10-14 DIAGNOSIS — E860 Dehydration: Secondary | ICD-10-CM

## 2015-10-14 DIAGNOSIS — R109 Unspecified abdominal pain: Secondary | ICD-10-CM

## 2015-10-14 DIAGNOSIS — M1990 Unspecified osteoarthritis, unspecified site: Secondary | ICD-10-CM | POA: Diagnosis present

## 2015-10-14 DIAGNOSIS — R21 Rash and other nonspecific skin eruption: Secondary | ICD-10-CM | POA: Diagnosis not present

## 2015-10-14 DIAGNOSIS — N289 Disorder of kidney and ureter, unspecified: Secondary | ICD-10-CM

## 2015-10-14 DIAGNOSIS — G4730 Sleep apnea, unspecified: Secondary | ICD-10-CM | POA: Diagnosis present

## 2015-10-14 LAB — CBC
HEMATOCRIT: 31.3 % — AB (ref 39.0–52.0)
HEMOGLOBIN: 9.2 g/dL — AB (ref 13.0–17.0)
MCH: 18.3 pg — AB (ref 26.0–34.0)
MCHC: 29.4 g/dL — ABNORMAL LOW (ref 30.0–36.0)
MCV: 62.4 fL — ABNORMAL LOW (ref 78.0–100.0)
Platelets: 179 10*3/uL (ref 150–400)
RBC: 5.02 MIL/uL (ref 4.22–5.81)
RDW: 16.8 % — ABNORMAL HIGH (ref 11.5–15.5)
WBC: 6.3 10*3/uL (ref 4.0–10.5)

## 2015-10-14 LAB — BASIC METABOLIC PANEL
ANION GAP: 10 (ref 5–15)
BUN: 52 mg/dL — ABNORMAL HIGH (ref 6–20)
CALCIUM: 9.2 mg/dL (ref 8.9–10.3)
CHLORIDE: 106 mmol/L (ref 101–111)
CO2: 24 mmol/L (ref 22–32)
Creatinine, Ser: 2.15 mg/dL — ABNORMAL HIGH (ref 0.61–1.24)
GFR calc non Af Amer: 26 mL/min — ABNORMAL LOW (ref >60)
GFR, EST AFRICAN AMERICAN: 30 mL/min — AB (ref >60)
Glucose, Bld: 130 mg/dL — ABNORMAL HIGH (ref 65–99)
POTASSIUM: 4.4 mmol/L (ref 3.5–5.1)
Sodium: 140 mmol/L (ref 135–145)

## 2015-10-14 LAB — URINALYSIS, ROUTINE W REFLEX MICROSCOPIC
Bilirubin Urine: NEGATIVE
Glucose, UA: NEGATIVE mg/dL
HGB URINE DIPSTICK: NEGATIVE
Ketones, ur: NEGATIVE mg/dL
Leukocytes, UA: NEGATIVE
NITRITE: NEGATIVE
PROTEIN: NEGATIVE mg/dL
SPECIFIC GRAVITY, URINE: 1.015 (ref 1.005–1.030)
pH: 5 (ref 5.0–8.0)

## 2015-10-14 MED ORDER — SODIUM CHLORIDE 0.9 % IV BOLUS (SEPSIS)
1000.0000 mL | Freq: Once | INTRAVENOUS | Status: AC
  Administered 2015-10-15: 1000 mL via INTRAVENOUS

## 2015-10-14 NOTE — ED Notes (Signed)
Pt states he has not been able to urinate since yesterday. Caregiver concerned he may be retaining, pt states he doesn't need a need to void. Caregiver also states he was here recently for abnormal kidney function.

## 2015-10-14 NOTE — ED Notes (Signed)
NP advised insertion of Foley for urinary retention and if it was less than 100 ml then remove.  40 ml were returned so cather was removed.

## 2015-10-14 NOTE — ED Provider Notes (Signed)
CSN: [id number]     Arrival date & time 10/14/15  1713 History   First MD Initiated Contact with Patient 10/14/15 2132     Chief Complaint  Patient presents with  . Urinary Retention     (Consider location/radiation/quality/duration/timing/severity/associated sxs/prior Treatment) HPI Comments: This is an 82 year old male who presents with the complaint of not being able to urinate for 24 hours.  He also reports that 2 days prior to that he had multiple episodes of nausea, vomiting and diarrhea.  He states that he did drink a quart of water today but has not urinated.  He does report abdominal distention and some discomfort.  Denies any fever.  He is not currently having diarrhea, nausea.  He states he did eat without difficulty today  The history is provided by the patient.    Past Medical History  Diagnosis Date  . Carotid artery stenosis   . Essential hypertension   . Myocardial infarction ([place]) 1979  . Coronary artery disease     Known occluded proximal LAD with collaterals, moderate RCA disease - managed medically 2009  . COPD (chronic obstructive pulmonary disease) ([place])   . History of stroke   . Prostate cancer ([place])   . Arthritis   . Foot drop, right   . Neuropathy ([place])   . Anemia   . Urinary incontinence, male, stress   . GERD (gastroesophageal reflux disease)   . Thalassemia   . Wears dentures   . Deafness in left ear     Wears hearing aide in right ear  . Sleep apnea     CPAP  . Ischemic cardiomyopathy     LVEF 40-45%  . Hypercholesteremia   . Anemia   . Osteoarthritis   . Stroke ([place])   . Heart attack ([place])   . Vertigo   . Hereditary and idiopathic peripheral neuropathy 03/01/2015  . Abnormality of gait 03/01/2015   Past Surgical History  Procedure Laterality Date  . Penile prosthesis implant    . Cholecystectomy    . Right carotid endarterectomy    . Back surgery    . Lumbar laminectomy  03/24/10    L3-4 and L4-5  . Ankle surgery      Bone infection   . Cardiac catheterization    . Appendectomy    . Joint replacement Left   . Radioactive seed implant  2009  . Pars plana vitrectomy  03/19/2012    Procedure: PARS PLANA VITRECTOMY WITH 25 GAUGE;  Surgeon: Hayden Pedro, MD;  Location: [facility];  Service: Ophthalmology;  Laterality: Right;  . Carotid endarterectomy Right     CEA - 15 yrs ago  . Spine surgery    . Eye surgery    . Cataract extraction w/ intraocular lens implant Left   . Endarterectomy Left 09/28/2014    Procedure: Left Carotid Endarterectomy;  Surgeon: Rosetta Posner, MD;  Location: [facility];  Service: Vascular;  Laterality: Left;  . Patch angioplasty Left 09/28/2014    Procedure: WITH DACRON PATCH ANGIOPLASTY;  Surgeon: Rosetta Posner, MD;  Location: [facility];  Service: Vascular;  Laterality: Left;   Family History  Problem Relation Age of Onset  . Thalassemia      Family history  . Cancer Mother 77    Bone cancer  . Diabetes Mother   . Heart disease Mother   . Heart attack Mother   . Hypertension Mother   . Stroke Father 60  . Heart disease Father   .  Hypertension Father   . Hypertension Sister   . Diabetes Sister   . Heart disease Brother   . Heart attack Brother 11  . Cancer Brother    Social History  Substance Use Topics  . Smoking status: Former Smoker -- 1.00 packs/day for 20 years    Types: Cigarettes    Quit date: 08/14/1977  . Smokeless tobacco: Never Used  . Alcohol Use: No     Comment: occasional    Review of Systems  Constitutional: Negative for fever and fatigue.  Respiratory: Negative for shortness of breath.   Gastrointestinal: Positive for abdominal distention. Negative for nausea, vomiting and diarrhea.  Genitourinary: Positive for decreased urine volume.  All other systems reviewed and are negative.     Allergies  Amoxicillin; Clopidogrel bisulfate; Dilaudid; Doxycycline hyclate; Penicillins; Sulfa antibiotics; Sulfamethoxazole; and Sulfonamide derivatives  Home Medications   Prior to  Admission medications   Medication Sig Start Date End Date Taking? Authorizing Provider  acetaminophen (TYLENOL) 325 MG tablet Take 650 mg by mouth every 6 (six) hours as needed for mild pain.    Yes Historical Provider, MD  aspirin 81 MG tablet Take 81 mg by mouth daily.   Yes Historical Provider, MD  carvedilol (COREG) 12.5 MG tablet TAKE 1 TABLET TWICE DAILY WITH A MEAL 06/08/15  Yes Dorothy Spark, MD  furosemide (LASIX) 20 MG tablet TAKE 1 TABLET BY MOUTH EVERY DAY Patient taking differently: Take 40 mg every morning 06/04/15  Yes Dorothy Spark, MD  gemfibrozil (LOPID) 600 MG tablet Take 600 mg by mouth 2 (two) times daily before a meal. Reported on 09/20/2015   Yes Historical Provider, MD  Glycopyrrolate-Formoterol (BEVESPI AEROSPHERE) 9-4.8 MCG/ACT AERO Inhale 2 puffs into the lungs every 12 (twelve) hours. 07/19/15  Yes Tanda Rockers, MD  LINZESS 145 MCG CAPS capsule Take 1 capsule by mouth daily with breakfast. 06/29/15  Yes Historical Provider, MD  losartan (COZAAR) 25 MG tablet Take 1 tablet (25 mg total) by mouth daily. 01/14/15  Yes Marijean Heath, NP  meclizine (ANTIVERT) 25 MG tablet Take 1 tablet (25 mg total) by mouth daily as needed for dizziness. 01/29/15  Yes Dorothy Spark, MD  mometasone-formoterol (DULERA) 200-5 MCG/ACT AERO Inhale 2 puffs into the lungs 2 (two) times daily. 10/01/15  Yes Tanda Rockers, MD  MYRBETRIQ 50 MG TB24 tablet Take 50 mg by mouth daily. 06/29/15  Yes Historical Provider, MD  NITROSTAT 0.4 MG SL tablet PLACE 1 TABLET (0.4 MG TOTAL) UNDER THE TONGUE EVERY 5 (FIVE) MINUTES AS NEEDED. FOR CHEST PAIN 04/08/15  Yes Dorothy Spark, MD  omeprazole (PRILOSEC) 40 MG capsule Take 1 capsule (40 mg total) by mouth daily. 05/21/15  Yes Dorothy Spark, MD  polyethylene glycol powder (MIRALAX) powder Take 17 g by mouth daily as needed for mild constipation.    Yes Historical Provider, MD  pregabalin (LYRICA) 75 MG capsule Take 75 mg by mouth 2 (two)  times daily.    Yes Historical Provider, MD  SYMBICORT 160-4.5 MCG/ACT inhaler INHALE 2 PUFFS FIRST THING IN THE MORNING AND THEN TAKE 2 PUFFS 12 HOURS LATER 07/23/15  Yes Tanda Rockers, MD  tiotropium (SPIRIVA) 18 MCG inhalation capsule Place 1 capsule (18 mcg total) into inhaler and inhale daily. 09/23/15  Yes Maryann Mikhail, DO  VENTOLIN HFA 108 (90 BASE) MCG/ACT inhaler INHALE 2 PUFFS INTO THE LUNGS EVERY 4 HOURS AS NEEDED FOR WHEEZING OR SHORTNESS OF BREATH Patient taking differently: INHALE  2 PUFFS INTO THE LUNGS daily. 07/12/15  Yes Tanda Rockers, MD  vitamin C (ASCORBIC ACID) 500 MG tablet Take 500 mg by mouth daily.   Yes Historical Provider, MD   BP 123/48 mmHg  Pulse 66  Temp(Src) 97.9 F (36.6 C) (Oral)  Resp 16  Ht 5\' 7"  (1.702 m)  Wt 77.111 kg  BMI 26.62 kg/m2  SpO2 97% Physical Exam  Constitutional: He is oriented to person, place, and time. He appears well-developed and well-nourished.  HENT:  Head: Normocephalic.  Eyes: Pupils are equal, round, and reactive to light.  Neck: Normal range of motion.  Cardiovascular: Normal rate and regular rhythm.   Pulmonary/Chest: Effort normal and breath sounds normal.  Abdominal: Soft. Bowel sounds are normal. He exhibits distension. There is no tenderness.  Slight abdominal distention which is unusual for this patient.  Nontender.  Positive bowel sounds  Musculoskeletal: Normal range of motion.  Neurological: He is alert and oriented to person, place, and time.  Skin: Skin is warm and dry.  Nursing note and vitals reviewed.   ED Course  Procedures (including critical care time) Labs Review Labs Reviewed  BASIC METABOLIC PANEL - Abnormal; Notable for the following:    Glucose, Bld 130 (*)    BUN 52 (*)    Creatinine, Ser 2.15 (*)    GFR calc non Af Amer 26 (*)    GFR calc Af Amer 30 (*)    All other components within normal limits  CBC - Abnormal; Notable for the following:    Hemoglobin 9.2 (*)    HCT 31.3 (*)    MCV  62.4 (*)    MCH 18.3 (*)    MCHC 29.4 (*)    RDW 16.8 (*)    All other components within normal limits  URINALYSIS, ROUTINE W REFLEX MICROSCOPIC (NOT AT [facility])    Imaging Review No results found. I have personally reviewed and evaluated these images and lab results as part of my medical decision-making.   EKG Interpretation None      MDM   Final diagnoses:  Acute renal insufficiency  Dehydration         Junius Creamer, NP 10/14/15 [id number]  Lacretia Leigh, MD 10/14/15 2320

## 2015-10-15 ENCOUNTER — Inpatient Hospital Stay (HOSPITAL_COMMUNITY): Payer: Medicare Other

## 2015-10-15 DIAGNOSIS — M1990 Unspecified osteoarthritis, unspecified site: Secondary | ICD-10-CM | POA: Diagnosis present

## 2015-10-15 DIAGNOSIS — R197 Diarrhea, unspecified: Secondary | ICD-10-CM | POA: Diagnosis present

## 2015-10-15 DIAGNOSIS — H9192 Unspecified hearing loss, left ear: Secondary | ICD-10-CM | POA: Diagnosis present

## 2015-10-15 DIAGNOSIS — K56609 Unspecified intestinal obstruction, unspecified as to partial versus complete obstruction: Secondary | ICD-10-CM | POA: Insufficient documentation

## 2015-10-15 DIAGNOSIS — Z886 Allergy status to analgesic agent status: Secondary | ICD-10-CM | POA: Diagnosis not present

## 2015-10-15 DIAGNOSIS — R112 Nausea with vomiting, unspecified: Secondary | ICD-10-CM | POA: Diagnosis present

## 2015-10-15 DIAGNOSIS — J449 Chronic obstructive pulmonary disease, unspecified: Secondary | ICD-10-CM | POA: Diagnosis present

## 2015-10-15 DIAGNOSIS — Z87891 Personal history of nicotine dependence: Secondary | ICD-10-CM | POA: Diagnosis not present

## 2015-10-15 DIAGNOSIS — D569 Thalassemia, unspecified: Secondary | ICD-10-CM | POA: Diagnosis present

## 2015-10-15 DIAGNOSIS — N179 Acute kidney failure, unspecified: Secondary | ICD-10-CM | POA: Diagnosis present

## 2015-10-15 DIAGNOSIS — I129 Hypertensive chronic kidney disease with stage 1 through stage 4 chronic kidney disease, or unspecified chronic kidney disease: Secondary | ICD-10-CM | POA: Diagnosis present

## 2015-10-15 DIAGNOSIS — M21371 Foot drop, right foot: Secondary | ICD-10-CM | POA: Diagnosis present

## 2015-10-15 DIAGNOSIS — Z881 Allergy status to other antibiotic agents status: Secondary | ICD-10-CM | POA: Diagnosis not present

## 2015-10-15 DIAGNOSIS — K5660 Unspecified intestinal obstruction: Secondary | ICD-10-CM | POA: Diagnosis present

## 2015-10-15 DIAGNOSIS — Z888 Allergy status to other drugs, medicaments and biological substances status: Secondary | ICD-10-CM | POA: Diagnosis not present

## 2015-10-15 DIAGNOSIS — Z7982 Long term (current) use of aspirin: Secondary | ICD-10-CM | POA: Diagnosis not present

## 2015-10-15 DIAGNOSIS — J439 Emphysema, unspecified: Secondary | ICD-10-CM | POA: Diagnosis not present

## 2015-10-15 DIAGNOSIS — R339 Retention of urine, unspecified: Secondary | ICD-10-CM | POA: Diagnosis present

## 2015-10-15 DIAGNOSIS — Z8673 Personal history of transient ischemic attack (TIA), and cerebral infarction without residual deficits: Secondary | ICD-10-CM | POA: Diagnosis not present

## 2015-10-15 DIAGNOSIS — D509 Iron deficiency anemia, unspecified: Secondary | ICD-10-CM | POA: Diagnosis present

## 2015-10-15 DIAGNOSIS — R21 Rash and other nonspecific skin eruption: Secondary | ICD-10-CM | POA: Diagnosis not present

## 2015-10-15 DIAGNOSIS — Z882 Allergy status to sulfonamides status: Secondary | ICD-10-CM | POA: Diagnosis not present

## 2015-10-15 DIAGNOSIS — R269 Unspecified abnormalities of gait and mobility: Secondary | ICD-10-CM | POA: Diagnosis not present

## 2015-10-15 DIAGNOSIS — G4730 Sleep apnea, unspecified: Secondary | ICD-10-CM | POA: Diagnosis present

## 2015-10-15 DIAGNOSIS — K219 Gastro-esophageal reflux disease without esophagitis: Secondary | ICD-10-CM | POA: Diagnosis present

## 2015-10-15 DIAGNOSIS — N183 Chronic kidney disease, stage 3 (moderate): Secondary | ICD-10-CM | POA: Diagnosis present

## 2015-10-15 DIAGNOSIS — Z8546 Personal history of malignant neoplasm of prostate: Secondary | ICD-10-CM | POA: Diagnosis not present

## 2015-10-15 DIAGNOSIS — Z79899 Other long term (current) drug therapy: Secondary | ICD-10-CM | POA: Diagnosis not present

## 2015-10-15 DIAGNOSIS — I10 Essential (primary) hypertension: Secondary | ICD-10-CM | POA: Diagnosis not present

## 2015-10-15 DIAGNOSIS — E7800 Pure hypercholesterolemia, unspecified: Secondary | ICD-10-CM | POA: Diagnosis present

## 2015-10-15 DIAGNOSIS — I255 Ischemic cardiomyopathy: Secondary | ICD-10-CM | POA: Diagnosis present

## 2015-10-15 DIAGNOSIS — Z880 Allergy status to penicillin: Secondary | ICD-10-CM | POA: Diagnosis not present

## 2015-10-15 DIAGNOSIS — K567 Ileus, unspecified: Secondary | ICD-10-CM | POA: Diagnosis present

## 2015-10-15 DIAGNOSIS — I252 Old myocardial infarction: Secondary | ICD-10-CM | POA: Diagnosis not present

## 2015-10-15 DIAGNOSIS — Z9842 Cataract extraction status, left eye: Secondary | ICD-10-CM | POA: Diagnosis not present

## 2015-10-15 DIAGNOSIS — I2510 Atherosclerotic heart disease of native coronary artery without angina pectoris: Secondary | ICD-10-CM | POA: Diagnosis present

## 2015-10-15 DIAGNOSIS — I6529 Occlusion and stenosis of unspecified carotid artery: Secondary | ICD-10-CM | POA: Diagnosis present

## 2015-10-15 DIAGNOSIS — R159 Full incontinence of feces: Secondary | ICD-10-CM | POA: Diagnosis present

## 2015-10-15 LAB — CBC
HCT: 27.2 % — ABNORMAL LOW (ref 39.0–52.0)
Hemoglobin: 8.3 g/dL — ABNORMAL LOW (ref 13.0–17.0)
MCH: 18.6 pg — AB (ref 26.0–34.0)
MCHC: 30.5 g/dL (ref 30.0–36.0)
MCV: 61 fL — ABNORMAL LOW (ref 78.0–100.0)
PLATELETS: 132 10*3/uL — AB (ref 150–400)
RBC: 4.46 MIL/uL (ref 4.22–5.81)
RDW: 16.6 % — AB (ref 11.5–15.5)
WBC: 5.4 10*3/uL (ref 4.0–10.5)

## 2015-10-15 LAB — BASIC METABOLIC PANEL
Anion gap: 7 (ref 5–15)
BUN: 51 mg/dL — AB (ref 6–20)
CALCIUM: 8.3 mg/dL — AB (ref 8.9–10.3)
CHLORIDE: 110 mmol/L (ref 101–111)
CO2: 20 mmol/L — AB (ref 22–32)
CREATININE: 1.8 mg/dL — AB (ref 0.61–1.24)
GFR calc non Af Amer: 32 mL/min — ABNORMAL LOW (ref >60)
GFR, EST AFRICAN AMERICAN: 37 mL/min — AB (ref >60)
Glucose, Bld: 100 mg/dL — ABNORMAL HIGH (ref 65–99)
Potassium: 3.8 mmol/L (ref 3.5–5.1)
Sodium: 137 mmol/L (ref 135–145)

## 2015-10-15 MED ORDER — NITROGLYCERIN 0.4 MG SL SUBL: 0.4000 mg | SUBLINGUAL_TABLET | SUBLINGUAL | Status: DC | PRN

## 2015-10-15 MED ORDER — PROMETHAZINE HCL 25 MG PO TABS: 12.5000 mg | ORAL_TABLET | Freq: Four times a day (QID) | ORAL | Status: DC | PRN

## 2015-10-15 MED ORDER — IPRATROPIUM-ALBUTEROL 0.5-2.5 (3) MG/3ML IN SOLN
3.0000 mL | RESPIRATORY_TRACT | Status: DC | PRN
  Administered 2015-10-17 – 2015-10-22 (×3): 3 mL via RESPIRATORY_TRACT
  Filled 2015-10-15 (×4): qty 3

## 2015-10-15 MED ORDER — SODIUM CHLORIDE 0.9 % IV SOLN
INTRAVENOUS | Status: DC
  Administered 2015-10-15 – 2015-10-17 (×4): via INTRAVENOUS

## 2015-10-15 MED ORDER — MORPHINE SULFATE (PF) 2 MG/ML IV SOLN
1.0000 mg | INTRAVENOUS | Status: DC | PRN
Start: 2015-10-15 — End: 2015-10-16

## 2015-10-15 MED ORDER — ENOXAPARIN SODIUM 30 MG/0.3ML ~~LOC~~ SOLN
30.0000 mg | SUBCUTANEOUS | Status: DC
  Administered 2015-10-15 – 2015-10-16 (×2): 30 mg via SUBCUTANEOUS
  Filled 2015-10-15 (×2): qty 0.3

## 2015-10-15 MED ORDER — PREGABALIN 75 MG PO CAPS
75.0000 mg | ORAL_CAPSULE | Freq: Two times a day (BID) | ORAL | Status: DC
  Administered 2015-10-15 – 2015-10-23 (×16): 75 mg via ORAL
  Filled 2015-10-15 (×16): qty 1

## 2015-10-15 MED ORDER — LOSARTAN POTASSIUM 50 MG PO TABS
25.0000 mg | ORAL_TABLET | Freq: Every day | ORAL | Status: DC
  Administered 2015-10-15 – 2015-10-23 (×9): 25 mg via ORAL
  Filled 2015-10-15 (×9): qty 1

## 2015-10-15 MED ORDER — CARVEDILOL 12.5 MG PO TABS
12.5000 mg | ORAL_TABLET | Freq: Two times a day (BID) | ORAL | Status: DC
  Administered 2015-10-15 – 2015-10-23 (×17): 12.5 mg via ORAL
  Filled 2015-10-15 (×17): qty 1

## 2015-10-15 MED ORDER — IOHEXOL 300 MG/ML  SOLN: 25.0000 mL | INTRAMUSCULAR | Status: AC

## 2015-10-15 MED ORDER — TIOTROPIUM BROMIDE MONOHYDRATE 18 MCG IN CAPS
18.0000 ug | ORAL_CAPSULE | Freq: Every day | RESPIRATORY_TRACT | Status: DC
  Administered 2015-10-15 – 2015-10-23 (×7): 18 ug via RESPIRATORY_TRACT
  Filled 2015-10-15 (×2): qty 5

## 2015-10-15 MED ORDER — MOMETASONE FURO-FORMOTEROL FUM 200-5 MCG/ACT IN AERO
2.0000 | INHALATION_SPRAY | Freq: Two times a day (BID) | RESPIRATORY_TRACT | Status: DC
  Administered 2015-10-15 – 2015-10-23 (×16): 2 via RESPIRATORY_TRACT
  Filled 2015-10-15: qty 8.8

## 2015-10-15 NOTE — Progress Notes (Signed)
Care order in place to catheterize pt if urinary retention persists

## 2015-10-15 NOTE — Care Management Note (Signed)
Case Management Note  Patient Details  Name: MERVIN NITZSCHE MRN: [id number] Date of Birth: 03/22/1924  Subjective/Objective:                    Action/Plan: Patient discharged 09-23-15   Patient lives at home alone with private caregivers from 7 am - 7 pm; patient is thinking about increasing their hours until his bedtime. They do his grocery shopping, cooking, cleaning and takes him to his apts. He has a walker / DME; No home oxygen at this time.   Has BCBS with prescription drug coverage, pharmacy oc choice is [facility], no problem getting his medication.  Patient was active with [facility] prior to admission , will ask MD for resumption of care orders .   Expected Discharge Date:                  Expected Discharge Plan:  [place]  In-House Referral:     Discharge planning Services  CM Consult  Post Acute Care Choice:  Home Health Choice offered to:  Patient  DME Arranged:    DME Agency:     HH Arranged:  PT [place]:  [place]  Status of Service:  In process, will continue to follow  Medicare Important Message Given:    Date Medicare IM Given:    Medicare IM give by:    Date Additional Medicare IM Given:    Additional Medicare Important Message give by:     If discussed at [place] of Stay Meetings, dates discussed:    Additional Comments:  Marilu Favre, RN 10/15/2015, 10:44 AM

## 2015-10-15 NOTE — Progress Notes (Signed)
Pt voiced that he is unable to void. Bladder scanned with over a thousand cc recorded. Pt asked to stand up and try to void as recommended by MD. Pt was able to void 400cc. Bladder scanned post void with 485cc noted. MD already gave verbal order to insert foley if pt unable to void. Will monitor situation and catheterize if needed

## 2015-10-15 NOTE — H&P (Signed)
[facility]  Patient Status: Active (receiving services up to time of hospitalization)  [facility] is providing the following services: PT  If patient discharges after hours, please call [phone].   [name] 10/15/2015, 12:56 PM

## 2015-10-15 NOTE — Progress Notes (Addendum)
Patient admitted after midnight, please see H&P.  Lots of diarrhea yesterday, none today Diarrhea -x ray showed SBO but there was none on CT Scan, just liquid stools -GI pathogen panel -recent hospitalization  AKI on CKD -gentle IVF -repeat in AM  [place]

## 2015-10-15 NOTE — H&P (Signed)
PCP:    Melinda Crutch, MD   Chief Complaint:  No urine output  HPI: This is a 82 year old gentleman who states that Tuesday night he developed an upset stomach. He had several episodes of nausea and vomiting. He did not have any diarrhea. He did not have any hematemesis. He later went on to have significant retching but no further emesis. He had an uneasy night, the next day he had abdominal discomfort that was fleeting and generalized. He did eat today with no issues and he did have a bowel movement this morning. Today he has not voided all day and therefore his family brought him to the ER.  Review of Systems:  The patient denies anorexia, fever, weight loss,, vision loss, decreased hearing, hoarseness, chest pain, syncope, dyspnea on exertion, peripheral edema, balance deficits, hemoptysis, abdominal pain, melena, hematochezia, severe indigestion/heartburn, hematuria, nausea, vomiting, abdominal pain, incontinence, genital sores, muscle weakness, suspicious skin lesions, transient blindness, difficulty walking, depression, unusual weight change, abnormal bleeding, enlarged lymph nodes, angioedema, and breast masses.  Past Medical History: Past Medical History  Diagnosis Date  . Carotid artery stenosis   . Essential hypertension   . Myocardial infarction ([place]) 1979  . Coronary artery disease     Known occluded proximal LAD with collaterals, moderate RCA disease - managed medically 2009  . COPD (chronic obstructive pulmonary disease) ([place])   . History of stroke   . Prostate cancer ([place])   . Arthritis   . Foot drop, right   . Neuropathy ([place])   . Anemia   . Urinary incontinence, male, stress   . GERD (gastroesophageal reflux disease)   . Thalassemia   . Wears dentures   . Deafness in left ear     Wears hearing aide in right ear  . Sleep apnea     CPAP  . Ischemic cardiomyopathy     LVEF 40-45%  . Hypercholesteremia   . Anemia   . Osteoarthritis   . Stroke ([place])   . Heart attack  ([place])   . Vertigo   . Hereditary and idiopathic peripheral neuropathy 03/01/2015  . Abnormality of gait 03/01/2015   Past Surgical History  Procedure Laterality Date  . Penile prosthesis implant    . Cholecystectomy    . Right carotid endarterectomy    . Back surgery    . Lumbar laminectomy  03/24/10    L3-4 and L4-5  . Ankle surgery      Bone infection  . Cardiac catheterization    . Appendectomy    . Joint replacement Left   . Radioactive seed implant  2009  . Pars plana vitrectomy  03/19/2012    Procedure: PARS PLANA VITRECTOMY WITH 25 GAUGE;  Surgeon: Hayden Pedro, MD;  Location: [place];  Service: Ophthalmology;  Laterality: Right;  . Carotid endarterectomy Right     CEA - 15 yrs ago  . Spine surgery    . Eye surgery    . Cataract extraction w/ intraocular lens implant Left   . Endarterectomy Left 09/28/2014    Procedure: Left Carotid Endarterectomy;  Surgeon: Rosetta Posner, MD;  Location: [facility];  Service: Vascular;  Laterality: Left;  . Patch angioplasty Left 09/28/2014    Procedure: WITH DACRON PATCH ANGIOPLASTY;  Surgeon: Rosetta Posner, MD;  Location: [facility] OR;  Service: Vascular;  Laterality: Left;    Medications: Prior to Admission medications   Medication Sig Start Date End Date Taking? Authorizing Provider  acetaminophen (TYLENOL) 325 MG tablet Take  650 mg by mouth every 6 (six) hours as needed for mild pain.    Yes Historical Provider, MD  aspirin 81 MG tablet Take 81 mg by mouth daily.   Yes Historical Provider, MD  carvedilol (COREG) 12.5 MG tablet TAKE 1 TABLET TWICE DAILY WITH A MEAL 06/08/15  Yes Dorothy Spark, MD  furosemide (LASIX) 20 MG tablet TAKE 1 TABLET BY MOUTH EVERY DAY Patient taking differently: Take 40 mg every morning 06/04/15  Yes Dorothy Spark, MD  gemfibrozil (LOPID) 600 MG tablet Take 600 mg by mouth 2 (two) times daily before a meal. Reported on 09/20/2015   Yes Historical Provider, MD  Glycopyrrolate-Formoterol (BEVESPI AEROSPHERE) 9-4.8  MCG/ACT AERO Inhale 2 puffs into the lungs every 12 (twelve) hours. 07/19/15  Yes Tanda Rockers, MD  LINZESS 145 MCG CAPS capsule Take 1 capsule by mouth daily with breakfast. 06/29/15  Yes Historical Provider, MD  losartan (COZAAR) 25 MG tablet Take 1 tablet (25 mg total) by mouth daily. 01/14/15  Yes Marijean Heath, NP  meclizine (ANTIVERT) 25 MG tablet Take 1 tablet (25 mg total) by mouth daily as needed for dizziness. 01/29/15  Yes Dorothy Spark, MD  mometasone-formoterol (DULERA) 200-5 MCG/ACT AERO Inhale 2 puffs into the lungs 2 (two) times daily. 10/01/15  Yes Tanda Rockers, MD  MYRBETRIQ 50 MG TB24 tablet Take 50 mg by mouth daily. 06/29/15  Yes Historical Provider, MD  NITROSTAT 0.4 MG SL tablet PLACE 1 TABLET (0.4 MG TOTAL) UNDER THE TONGUE EVERY 5 (FIVE) MINUTES AS NEEDED. FOR CHEST PAIN 04/08/15  Yes Dorothy Spark, MD  omeprazole (PRILOSEC) 40 MG capsule Take 1 capsule (40 mg total) by mouth daily. 05/21/15  Yes Dorothy Spark, MD  polyethylene glycol powder (MIRALAX) powder Take 17 g by mouth daily as needed for mild constipation.    Yes Historical Provider, MD  pregabalin (LYRICA) 75 MG capsule Take 75 mg by mouth 2 (two) times daily.    Yes Historical Provider, MD  SYMBICORT 160-4.5 MCG/ACT inhaler INHALE 2 PUFFS FIRST THING IN THE MORNING AND THEN TAKE 2 PUFFS 12 HOURS LATER 07/23/15  Yes Tanda Rockers, MD  tiotropium (SPIRIVA) 18 MCG inhalation capsule Place 1 capsule (18 mcg total) into inhaler and inhale daily. 09/23/15  Yes Maryann Mikhail, DO  VENTOLIN HFA 108 (90 BASE) MCG/ACT inhaler INHALE 2 PUFFS INTO THE LUNGS EVERY 4 HOURS AS NEEDED FOR WHEEZING OR SHORTNESS OF BREATH Patient taking differently: INHALE 2 PUFFS INTO THE LUNGS daily. 07/12/15  Yes Tanda Rockers, MD  vitamin C (ASCORBIC ACID) 500 MG tablet Take 500 mg by mouth daily.   Yes Historical Provider, MD    Allergies:   Allergies  Allergen Reactions  . Amoxicillin     REACTION: unspecified  .  Clopidogrel Bisulfate Hives    REACTION: red blue, black blotches  . Dilaudid [Hydromorphone Hcl] Hives    Hallucinations, little green men working on the building  . Doxycycline Hyclate   . Penicillins Hives    Has patient had a PCN reaction causing immediate rash, facial/tongue/throat swelling, SOB or lightheadedness with hypotension: Yes Has patient had a PCN reaction causing severe rash involving mucus membranes or skin necrosis: No Has patient had a PCN reaction that required hospitalization No Has patient had a PCN reaction occurring within the last 10 years: No If all of the above answers are "NO", then may proceed with Cephalosporin use.   . Sulfa Antibiotics Other (See Comments)  Nightmares, hallucinations  . Sulfamethoxazole Other (See Comments)    Nightmares, hallucinations  . Sulfonamide Derivatives Other (See Comments)    Nightmares, hallucinations    Social History:  reports that he quit smoking about 38 years ago. His smoking use included Cigarettes. He has a 20 pack-year smoking history. He has never used smokeless tobacco. He reports that he does not drink alcohol or use illicit drugs.  Family History: Family History  Problem Relation Age of Onset  . Thalassemia      Family history  . Cancer Mother 93    Bone cancer  . Diabetes Mother   . Heart disease Mother   . Heart attack Mother   . Hypertension Mother   . Stroke Father 10  . Heart disease Father   . Hypertension Father   . Hypertension Sister   . Diabetes Sister   . Heart disease Brother   . Heart attack Brother 56  . Cancer Brother     Physical Exam: Filed Vitals:   10/14/15 1748 10/14/15 2302  BP: 112/48 123/48  Pulse: 66 66  Temp: 97.9 F (36.6 C)   TempSrc: Oral   Resp: 16 16  Height: 5\' 7"  (1.702 m)   Weight: 77.111 kg (170 lb)   SpO2: 98% 97%    General:  Alert and oriented times three, well developed and nourished, no acute distress Eyes: PERRLA, pink conjunctiva, no scleral  icterus ENT: Moist oral mucosa, neck supple, no thyromegaly Lungs: clear to ascultation, no wheeze, no crackles, no use of accessory muscles Cardiovascular: regular rate and rhythm, no regurgitation, no gallops, no murmurs. No carotid bruits, no JVD Abdomen: soft,no BS, mild nonspecific tenderness to palpation, non-distended, no organomegaly, not an acute abdomen GU: not examined Neuro: CN II - XII grossly intact, sensation intact Musculoskeletal: strength 5/5 all extremities, no clubbing, cyanosis or edema Skin: no rash, no subcutaneous crepitation, no decubitus Psych: appropriate patient   Labs on Admission:   Recent Labs  10/14/15 1801  NA 140  K 4.4  CL 106  CO2 24  GLUCOSE 130*  BUN 52*  CREATININE 2.15*  CALCIUM 9.2   No results for input(s): AST, ALT, ALKPHOS, BILITOT, PROT, ALBUMIN in the last 72 hours. No results for input(s): LIPASE, AMYLASE in the last 72 hours.  Recent Labs  10/14/15 1801  WBC 6.3  HGB 9.2*  HCT 31.3*  MCV 62.4*  PLT 179   No results for input(s): CKTOTAL, CKMB, CKMBINDEX, TROPONINI in the last 72 hours. Invalid input(s): POCBNP No results for input(s): DDIMER in the last 72 hours. No results for input(s): HGBA1C in the last 72 hours. No results for input(s): CHOL, HDL, LDLCALC, TRIG, CHOLHDL, LDLDIRECT in the last 72 hours. No results for input(s): TSH, T4TOTAL, T3FREE, THYROIDAB in the last 72 hours.  Invalid input(s): FREET3 No results for input(s): VITAMINB12, FOLATE, FERRITIN, TIBC, IRON, RETICCTPCT in the last 72 hours.  Micro Results: No results found for this or any previous visit (from the past 240 hour(s)).   Radiological Exams on Admission: Dg Abd Acute W/chest  10/14/2015  CLINICAL DATA:  Acute onset of generalized abdominal bloating. Initial encounter. EXAM: DG ABDOMEN ACUTE W/ 1V CHEST COMPARISON:  Chest radiograph performed 09/20/2015 FINDINGS: The lungs are well-aerated and clear. There is no evidence of focal  opacification, pleural effusion or pneumothorax. The cardiomediastinal silhouette is within normal limits. There is dilatation of small bowel loops up to 5.8 cm in maximal diameter, with associated air-fluid levels, concerning for  small bowel obstruction. Residual air is still seen in the colon. No free intra-abdominal air is identified on the provided upright view. Clips are noted within the right upper quadrant, reflecting prior cholecystectomy. Clips are also noted at the right side of the neck. No acute osseous abnormalities are seen; the sacroiliac joints are unremarkable in appearance. IMPRESSION: 1. Dilatation of small bowel loops up to 5.8 cm in maximal diameter, with air-fluid levels, concerning for relatively high-grade small bowel obstruction. 2. No acute cardiopulmonary process seen. These results were called by telephone at the time of interpretation on 10/14/2015 at 11:29 pm to [facility] in the [facility] ER, who verbally acknowledged these results. Electronically Signed   By: Garald Balding M.D.   On: 10/14/2015 23:30    Assessment/Plan Present on Admission:  . SBO (small bowel obstruction) (HCC) -Admit to MedSurg -CT abdomen and pelvis ordered for confirmation -KUB for the a.m. -NG tube to do wall suction -Nothing by mouth, IV fluid hydration -Gentle IV pain medications -Consult surgery in a.m. Acute on chronic kidney injury  - IV fluid hydration, strict I's and O's -BMP in a.m.  [place] CAD, NATIVE VESSEL -Stable, resume home medications  . COPD GOLD III -Stable, resume home medications  . HYPERTENSION, BENIGN -Stable, resume home medications   Andilyn Bettcher 10/15/2015, 12:44 AM

## 2015-10-16 ENCOUNTER — Inpatient Hospital Stay (HOSPITAL_COMMUNITY): Payer: Medicare Other

## 2015-10-16 DIAGNOSIS — I2510 Atherosclerotic heart disease of native coronary artery without angina pectoris: Secondary | ICD-10-CM

## 2015-10-16 DIAGNOSIS — J449 Chronic obstructive pulmonary disease, unspecified: Secondary | ICD-10-CM

## 2015-10-16 DIAGNOSIS — R269 Unspecified abnormalities of gait and mobility: Secondary | ICD-10-CM

## 2015-10-16 DIAGNOSIS — I10 Essential (primary) hypertension: Secondary | ICD-10-CM

## 2015-10-16 LAB — CBC
HEMATOCRIT: 25.8 % — AB (ref 39.0–52.0)
Hemoglobin: 8 g/dL — ABNORMAL LOW (ref 13.0–17.0)
MCH: 19.1 pg — AB (ref 26.0–34.0)
MCHC: 31 g/dL (ref 30.0–36.0)
MCV: 61.7 fL — AB (ref 78.0–100.0)
PLATELETS: 133 10*3/uL — AB (ref 150–400)
RBC: 4.18 MIL/uL — AB (ref 4.22–5.81)
RDW: 17.3 % — AB (ref 11.5–15.5)
WBC: 6.2 10*3/uL (ref 4.0–10.5)

## 2015-10-16 LAB — BASIC METABOLIC PANEL
ANION GAP: 9 (ref 5–15)
BUN: 28 mg/dL — AB (ref 6–20)
CO2: 19 mmol/L — AB (ref 22–32)
Calcium: 8.6 mg/dL — ABNORMAL LOW (ref 8.9–10.3)
Chloride: 111 mmol/L (ref 101–111)
Creatinine, Ser: 1.3 mg/dL — ABNORMAL HIGH (ref 0.61–1.24)
GFR calc Af Amer: 54 mL/min — ABNORMAL LOW (ref >60)
GFR calc non Af Amer: 47 mL/min — ABNORMAL LOW (ref >60)
GLUCOSE: 98 mg/dL (ref 65–99)
POTASSIUM: 4.1 mmol/L (ref 3.5–5.1)
Sodium: 139 mmol/L (ref 135–145)

## 2015-10-16 LAB — C DIFFICILE QUICK SCREEN W PCR REFLEX
C DIFFICILE (CDIFF) INTERP: NEGATIVE
C Diff antigen: NEGATIVE
C Diff toxin: NEGATIVE

## 2015-10-16 MED ORDER — ENOXAPARIN SODIUM 40 MG/0.4ML ~~LOC~~ SOLN
40.0000 mg | SUBCUTANEOUS | Status: DC
  Administered 2015-10-17 – 2015-10-23 (×7): 40 mg via SUBCUTANEOUS
  Filled 2015-10-16 (×7): qty 0.4

## 2015-10-16 MED ORDER — DOCUSATE SODIUM 100 MG PO CAPS
100.0000 mg | ORAL_CAPSULE | Freq: Two times a day (BID) | ORAL | Status: DC
  Administered 2015-10-16 – 2015-10-18 (×6): 100 mg via ORAL
  Filled 2015-10-16 (×6): qty 1

## 2015-10-16 MED ORDER — CETYLPYRIDINIUM CHLORIDE 0.05 % MT LIQD
7.0000 mL | Freq: Two times a day (BID) | OROMUCOSAL | Status: DC
  Administered 2015-10-16 – 2015-10-17 (×2): 7 mL via OROMUCOSAL

## 2015-10-16 MED ORDER — CHLORHEXIDINE GLUCONATE 0.12 % MT SOLN
15.0000 mL | Freq: Two times a day (BID) | OROMUCOSAL | Status: DC
  Administered 2015-10-16 – 2015-10-17 (×2): 15 mL via OROMUCOSAL
  Filled 2015-10-16 (×2): qty 15

## 2015-10-16 MED ORDER — BISACODYL 10 MG RE SUPP
10.0000 mg | Freq: Once | RECTAL | Status: AC
  Administered 2015-10-16: 10 mg via RECTAL
  Filled 2015-10-16: qty 1

## 2015-10-16 NOTE — Evaluation (Signed)
Physical Therapy Evaluation Patient Details Name: [name] MRN: [id number] DOB: [birth year] Today's Date: 10/16/2015   History of Present Illness  Patient is a 82 year old male with COPD, CAD, GERD, presented with several episodes of nausea and vomiting. Admitted for ileus, and acute on chronic kidney injury.  Clinical Impression  Pt admitted with above complications. Pt currently with functional limitations due to the deficits listed below (see PT Problem List). Reports he feels near his baseline with mobility but a bit more uncomfortable due to his neuropathy. Has been making progress with HHPT and has an aide 12 hr/day. We will continue to follow and progress while admitted.      Follow Up Recommendations Home health PT;Supervision - Intermittent (Continue with current level of care)    Equipment Recommendations  None recommended by PT    Recommendations for Other Services       Precautions / Restrictions Precautions Precautions: Fall Precaution Comments: bil LE AFO shoes Restrictions Weight Bearing Restrictions: No      Mobility  Bed Mobility Overal bed mobility: Independent                Transfers Overall transfer level: Needs assistance Equipment used: Rolling walker (2 wheeled) Transfers: Sit to/from Stand Sit to Stand: Supervision         General transfer comment: supervision for safety. VC for walker and hand placement prior to rising.  Ambulation/Gait Ambulation/Gait assistance: Supervision Ambulation Distance (Feet): 150 Feet Assistive device: Rolling walker (2 wheeled) Gait Pattern/deviations: Step-through pattern;Decreased stride length;Steppage;Trunk flexed Gait velocity: decreased Gait velocity interpretation: Below normal speed for age/gender General Gait Details: VC for walker placement for proximity. No buckling or overt loss of balance noted while using a rolling walker for supprt. Pt hesitant to ambulate further due to recent suppository.  States his gait feels near baseline.  Stairs            Wheelchair Mobility    Modified Rankin (Stroke Patients Only)       Balance Overall balance assessment: Needs assistance Sitting-balance support: No upper extremity supported;Feet supported Sitting balance-Leahy Scale: Good     Standing balance support: No upper extremity supported Standing balance-Leahy Scale: Fair                               Pertinent Vitals/Pain Pain Assessment: No/denies pain    Home Living Family/patient expects to be discharged to:: Private residence Living Arrangements: Alone Available Help at Discharge: Available PRN/intermittently (aide from 7A-7P) Type of Home: House Home Access: Stairs to enter   [employer] of Steps: 3 Home Layout: One level Home Equipment: Walker - 2 wheels;Walker - 4 wheels      Prior Function Level of Independence: Needs assistance   Gait / Transfers Assistance Needed: walks with RW  ADL's / Homemaking Assistance Needed: aide present 7A- 7P to assist with house hold and shopping        Hand Dominance   Dominant Hand: Right    Extremity/Trunk Assessment               Lower Extremity Assessment: RLE deficits/detail;LLE deficits/detail (Overall strength hip flexion, knee extension WFL) RLE Deficits / Details: AFO (hx of neuropathy) LLE Deficits / Details: AFO (hx of neuropathy)     Communication   Communication: HOH  Cognition Arousal/Alertness: Awake/alert Behavior During Therapy: WFL for tasks assessed/performed Overall Cognitive Status: Within Functional Limits for tasks assessed  General Comments General comments (skin integrity, edema, etc.): Getting HHPT and has an aide. Moderate dyspnea with exertion but improves with seated rest and SpO2 98% on room air.    Exercises        Assessment/Plan    PT Assessment Patient needs continued PT services  PT Diagnosis Difficulty  walking;Abnormality of gait;Generalized weakness   PT Problem List Decreased strength;Decreased range of motion;Decreased activity tolerance;Decreased balance;Decreased mobility;Decreased knowledge of use of DME;Impaired sensation  PT Treatment Interventions DME instruction;Gait training;Stair training;Functional mobility training;Therapeutic activities;Therapeutic exercise;Balance training;Patient/family education;Neuromuscular re-education   PT Goals (Current goals can be found in the Care Plan section) Acute Rehab PT Goals Patient Stated Goal: Get better PT Goal Formulation: With patient Time For Goal Achievement: 10/30/15 Potential to Achieve Goals: Good    Frequency Min 3X/week   Barriers to discharge Decreased caregiver support has an aide 12 hr/day    Co-evaluation               End of Session Equipment Utilized During Treatment: Gait belt Activity Tolerance: Patient tolerated treatment well Patient left: in chair;with call bell/phone within reach Nurse Communication: Mobility status;Other (comment)         TimeEX:904995 PT Time Calculation (min) (ACUTE ONLY): 21 min   Charges:   PT Evaluation $PT Eval Low Complexity: 1 Procedure     PT G CodesEllouise Newer 10/16/2015, 2:57 PM Camille Bal Alsey, [place]

## 2015-10-16 NOTE — Progress Notes (Signed)
[facility]                                                                              Patient Demographics  [name], is a 82 y.o. male, DOB - [birth year], [id number]  Admit date - 10/14/2015   Admitting Physician Quintella Baton, MD  Outpatient Primary MD for the patient is  Melinda Crutch, MD  LOS - 1   Chief Complaint  Patient presents with  . Urinary Retention       Brief HPI   Patient is a 82 year old male with COPD, CAD, GERD, presented with several episodes of nausea and vomiting, no diarrhea. No hematemesis. On the day of admission, patient had not voided all day and therefore family brought him to the ER.   Assessment & Plan    Abdominal distention with ileus - Admission abdominal x-ray was consistent with SBO however not seen on CT abdomen and pelvis.  - Patient was started on clear liquid diet however abdomen still distended hence abdominal x-ray done this morning shows a small bowel obstruction. -Surgery consult was called and I discussed with Dr. Rosendo Gros, who mentioned that looking at the abdominal x-ray today, the contrast has passed through the colon and hence patient does not meet criteria for small bowel obstruction. Recommended to treat as ileus. - Placed on NPO status, IV fluids, discontinue morphine - Placed on Dulcolax suppository, stool softener - Currently no nausea, vomiting hence no need of NGT, recheck abdominal x-ray in a.m.  Acute on chronic kidney injury - Continue gentle hydration, creatinine improving  COPD - Currently stable, no wheezing, continue dulera  Essential hypertension - Currently stable, continue Coreg, losartan  Code Status: Full CODE STATUS  Family Communication: Discussed in detail with the patient, all imaging results, lab results explained to the patient   Disposition Plan: Once the ileus resolves and patient is tolerating solid diet  Time Spent in minutes   25 minutes  Procedures  CT  abdomen  Consults   Called surgery, discussed with Dr. Rosendo Gros  DVT Prophylaxis  Lovenox  Medications  Scheduled Meds: . carvedilol  12.5 mg Oral BID WC  . docusate sodium  100 mg Oral BID  . enoxaparin (LOVENOX) injection  30 mg Subcutaneous Q24H  . losartan  25 mg Oral Daily  . mometasone-formoterol  2 puff Inhalation BID  . pregabalin  75 mg Oral BID  . tiotropium  18 mcg Inhalation QAC breakfast   Continuous Infusions: . sodium chloride 50 mL/hr at 10/15/15 2004   PRN Meds:.ipratropium-albuterol, nitroGLYCERIN, promethazine   Antibiotics   Anti-infectives    None        Subjective:   Crash Lontz was seen and examined today.  Abdomen quite distended, last BM yesterday morning. Patient denies dizziness, chest pain, shortness of breath, new weakness, numbess, tingling. No acute events overnight.    Objective:   Filed Vitals:   10/15/15 2034 10/15/15 2249 10/16/15 0629 10/16/15 0804  BP:  102/45 112/43   Pulse:  67 75   Temp:  98.3 F (36.8 C) 98.9 F (37.2 C)   TempSrc:  Oral Oral   Resp:  18 18   Height:      Weight:      SpO2: 98% 97% 96% 98%    Intake/Output Summary (Last 24 hours) at 10/16/15 1408 Last data filed at 10/16/15 1400  Gross per 24 hour  Intake   2694 ml  Output   1905 ml  Net    789 ml     Wt Readings from Last 3 Encounters:  10/15/15 78.699 kg (173 lb 8 oz)  09/23/15 77.384 kg (170 lb 9.6 oz)  07/19/15 77.656 kg (171 lb 3.2 oz)     Exam  General: Alert and oriented x 3, NAD  HEENT:  PERRLA, EOMI, Anicteric Sclera, mucous membranes moist.   Neck: Supple, no JVD, no masses  CVS: S1 S2 auscultated, no rubs, murmurs or gallops. Regular rate and rhythm.  Respiratory: Clear to auscultation bilaterally, no wheezing, rales or rhonchi  Abdomen: Soft, nontender, distended, + bowel sounds  Ext: no cyanosis clubbing or edema  Neuro: AAOx3, Cr N's II- XII. Strength 5/5 upper and lower extremities bilaterally  Skin: No  rashes  Psych: Normal affect and demeanor, alert and oriented x3    Data Review   Micro Results No results found for this or any previous visit (from the past 240 hour(s)).  Radiology Reports Ct Abdomen Pelvis Wo Contrast  10/15/2015  CLINICAL DATA:  Acute onset of generalized abdominal pain and diarrhea. Initial encounter. EXAM: CT ABDOMEN AND PELVIS WITHOUT CONTRAST TECHNIQUE: Multidetector CT imaging of the abdomen and pelvis was performed following the standard protocol without IV contrast. COMPARISON:  CT of the abdomen and pelvis performed 09/12/2012 FINDINGS: Minimal bibasilar atelectasis or scarring is noted. Diffuse coronary artery calcifications are seen. There is marked thinning of the myocardium at the ventricular apex, with associated calcification and mild focal bulging, reflecting prior myocardial infarction. Calcification is noted at the aortic and mitral valves. Scattered calcified granulomata are noted within the liver. The liver and spleen are otherwise grossly unremarkable. The patient is status post cholecystectomy, with clips noted along the gallbladder fossa. The pancreas and adrenal glands are grossly unremarkable. A few scattered bilateral renal cysts are seen, measuring up to 3.8 cm in size. There is question of minimal associated calcification at a small right renal cyst. Nonspecific perinephric stranding is noted bilaterally. Minimal right-sided hydronephrosis is nonspecific. No definite distal obstruction is seen. Scattered vascular calcifications are seen at the renal hila. No free fluid is identified. The small bowel is unremarkable in appearance. The stomach is within normal limits. No acute vascular abnormalities are seen. Relatively diffuse calcification is noted along the abdominal aorta and its branches. There is minimal ectasia of the infrarenal abdominal aorta, without evidence of aneurysmal dilatation. The patient is status post appendectomy. The colon is largely  filled with fluid, corresponding to the patient's diarrhea. The bladder is mildly distended and grossly unremarkable. A reservoir is noted at the right hemipelvis. Scattered brachytherapy seeds are noted at the prostate bed. No inguinal lymphadenopathy is seen. No acute osseous abnormalities are identified. Multilevel vacuum phenomenon is noted along the lumbar spine. There is minimal grade 1 retrolisthesis of L3 on L4, and minimal grade 1 anterolisthesis of L4 on L5, reflecting underlying facet disease. IMPRESSION: 1. Colon largely filled with fluid, corresponding to the patient's diarrhea. 2. Marked thinning of the myocardium at the ventricular apex, with associated calcification and mild focal bulging, reflecting prior myocardial infarction. 3. Diffuse coronary artery calcifications seen. Calcification at the aortic and mitral valves. 4. Few scattered  bilateral renal cysts noted. Minimal nonspecific right-sided hydronephrosis, without evidence of distal obstruction. 5. Relatively diffuse calcification along the abdominal aorta and its branches. 6. Mild degenerative change along the lumbar spine. Electronically Signed   By: Garald Balding M.D.   On: 10/15/2015 06:13   X-ray Chest Pa And Lateral  09/20/2015  CLINICAL DATA:  Syncope and weakness.  COPD. EXAM: CHEST  2 VIEW COMPARISON:  01/29/2015. FINDINGS: The heart size and mediastinal contours are within normal limits. Both lungs are clear. The visualized skeletal structures are unremarkable. Hyperinflation without pneumothorax. Surgical clips RIGHT neck stable. Thoracic atherosclerosis. Thoracic spine degenerative change. Similar appearance to priors. IMPRESSION: COPD, no active disease. Electronically Signed   By: Staci Righter M.D.   On: 09/20/2015 22:18   Ct Head Wo Contrast  09/20/2015  CLINICAL DATA:  82 year old male with fall and trauma to the head. EXAM: CT HEAD WITHOUT CONTRAST CT CERVICAL SPINE WITHOUT CONTRAST TECHNIQUE: Multidetector CT imaging  of the head and cervical spine was performed following the standard protocol without intravenous contrast. Multiplanar CT image reconstructions of the cervical spine were also generated. COMPARISON:  CT dated 11/20/2013 FINDINGS: CT HEAD FINDINGS There is slight prominence of the ventricles and sulci compatible with age-related volume loss. Mild periventricular and deep white matter hypodensities represent chronic microvascular ischemic changes. Small old left basal ganglia lacunar infarct. There is no intracranial hemorrhage. No mass effect or midline shift identified. There is partial opacification of the right frontal sinus. No air-fluid level. The remainder of the paranasal sinuses and mastoid air cells are well aerated. The calvarium is intact. CT CERVICAL SPINE FINDINGS There is no acute fracture or subluxation of the cervical spine.There is osteopenia with multilevel degenerative changes. There is disc desiccation and narrowing most prominent at C5-C6, and C6-C7.The odontoid and spinous processes are intact.There is normal anatomic alignment of the C1-C2 lateral masses. The visualized soft tissues appear unremarkable. Multiple surgical clips noted in the right side of the neck possibly related to carotid endarterectomy. Right carotid bulb calcific plaques noted. IMPRESSION: No acute intracranial hemorrhage. Mild age-related atrophy and chronic microvascular ischemic disease. No acute/traumatic cervical spine pathology. Electronically Signed   By: Anner Crete M.D.   On: 09/20/2015 19:08   Ct Cervical Spine Wo Contrast  09/20/2015  CLINICAL DATA:  82 year old male with fall and trauma to the head. EXAM: CT HEAD WITHOUT CONTRAST CT CERVICAL SPINE WITHOUT CONTRAST TECHNIQUE: Multidetector CT imaging of the head and cervical spine was performed following the standard protocol without intravenous contrast. Multiplanar CT image reconstructions of the cervical spine were also generated. COMPARISON:  CT dated  11/20/2013 FINDINGS: CT HEAD FINDINGS There is slight prominence of the ventricles and sulci compatible with age-related volume loss. Mild periventricular and deep white matter hypodensities represent chronic microvascular ischemic changes. Small old left basal ganglia lacunar infarct. There is no intracranial hemorrhage. No mass effect or midline shift identified. There is partial opacification of the right frontal sinus. No air-fluid level. The remainder of the paranasal sinuses and mastoid air cells are well aerated. The calvarium is intact. CT CERVICAL SPINE FINDINGS There is no acute fracture or subluxation of the cervical spine.There is osteopenia with multilevel degenerative changes. There is disc desiccation and narrowing most prominent at C5-C6, and C6-C7.The odontoid and spinous processes are intact.There is normal anatomic alignment of the C1-C2 lateral masses. The visualized soft tissues appear unremarkable. Multiple surgical clips noted in the right side of the neck possibly related to carotid endarterectomy. Right  carotid bulb calcific plaques noted. IMPRESSION: No acute intracranial hemorrhage. Mild age-related atrophy and chronic microvascular ischemic disease. No acute/traumatic cervical spine pathology. Electronically Signed   By: Anner Crete M.D.   On: 09/20/2015 19:08   Dg Abd Acute W/chest  10/14/2015  CLINICAL DATA:  Acute onset of generalized abdominal bloating. Initial encounter. EXAM: DG ABDOMEN ACUTE W/ 1V CHEST COMPARISON:  Chest radiograph performed 09/20/2015 FINDINGS: The lungs are well-aerated and clear. There is no evidence of focal opacification, pleural effusion or pneumothorax. The cardiomediastinal silhouette is within normal limits. There is dilatation of small bowel loops up to 5.8 cm in maximal diameter, with associated air-fluid levels, concerning for small bowel obstruction. Residual air is still seen in the colon. No free intra-abdominal air is identified on the  provided upright view. Clips are noted within the right upper quadrant, reflecting prior cholecystectomy. Clips are also noted at the right side of the neck. No acute osseous abnormalities are seen; the sacroiliac joints are unremarkable in appearance. IMPRESSION: 1. Dilatation of small bowel loops up to 5.8 cm in maximal diameter, with air-fluid levels, concerning for relatively high-grade small bowel obstruction. 2. No acute cardiopulmonary process seen. These results were called by telephone at the time of interpretation on 10/14/2015 at 11:29 pm to [facility] in the [facility] ER, who verbally acknowledged these results. Electronically Signed   By: Garald Balding M.D.   On: 10/14/2015 23:30   Dg Abd Portable 1v  10/16/2015  CLINICAL DATA:  Small bowel obstruction. EXAM: PORTABLE ABDOMEN - 1 VIEW COMPARISON:  CT scan October 15, 2015 FINDINGS: There is contrast within the decompressed colon. Dilated loops of small bowel are seen diffusely, most prominent in the central and left abdomen. No free air, portal venous gas, or pneumatosis identified. No other acute abnormalities. IMPRESSION: Small bowel obstruction. Electronically Signed   By: Dorise Bullion III M.D   On: 10/16/2015 09:24    CBC  Recent Labs Lab 10/14/15 1801 10/15/15 0802 10/16/15 0632  WBC 6.3 5.4 6.2  HGB 9.2* 8.3* 8.0*  HCT 31.3* 27.2* 25.8*  PLT 179 132* 133*  MCV 62.4* 61.0* 61.7*  MCH 18.3* 18.6* 19.1*  MCHC 29.4* 30.5 31.0  RDW 16.8* 16.6* 17.3*    Chemistries   Recent Labs Lab 10/14/15 1801 10/15/15 0802 10/16/15 0632  NA 140 137 139  K 4.4 3.8 4.1  CL 106 110 111  CO2 24 20* 19*  GLUCOSE 130* 100* 98  BUN 52* 51* 28*  CREATININE 2.15* 1.80* 1.30*  CALCIUM 9.2 8.3* 8.6*   ------------------------------------------------------------------------------------------------------------------ estimated creatinine clearance is 42.3 mL/min (by C-G formula based on Cr of  1.3). ------------------------------------------------------------------------------------------------------------------ No results for input(s): HGBA1C in the last 72 hours. ------------------------------------------------------------------------------------------------------------------ No results for input(s): CHOL, HDL, LDLCALC, TRIG, CHOLHDL, LDLDIRECT in the last 72 hours. ------------------------------------------------------------------------------------------------------------------ No results for input(s): TSH, T4TOTAL, T3FREE, THYROIDAB in the last 72 hours.  Invalid input(s): FREET3 ------------------------------------------------------------------------------------------------------------------ No results for input(s): VITAMINB12, FOLATE, FERRITIN, TIBC, IRON, RETICCTPCT in the last 72 hours.  Coagulation profile No results for input(s): INR, PROTIME in the last 168 hours.  No results for input(s): DDIMER in the last 72 hours.  Cardiac Enzymes No results for input(s): CKMB, TROPONINI, MYOGLOBIN in the last 168 hours.  Invalid input(s): CK ------------------------------------------------------------------------------------------------------------------ Invalid input(s): POCBNP  No results for input(s): GLUCAP in the last 72 hours.   RAI,RIPUDEEP M.D. [facility] 10/16/2015, 2:08 PM  Pager: [phone] Between 7am to 7pm - call Pager - [phone]  After  7pm go to [email or web address] - password [email or web address]  Call night coverage person covering after 7pm

## 2015-10-17 ENCOUNTER — Inpatient Hospital Stay (HOSPITAL_COMMUNITY): Payer: Medicare Other

## 2015-10-17 DIAGNOSIS — R197 Diarrhea, unspecified: Secondary | ICD-10-CM

## 2015-10-17 LAB — BASIC METABOLIC PANEL
Anion gap: 7 (ref 5–15)
BUN: 21 mg/dL — AB (ref 6–20)
CHLORIDE: 114 mmol/L — AB (ref 101–111)
CO2: 21 mmol/L — ABNORMAL LOW (ref 22–32)
CREATININE: 1.28 mg/dL — AB (ref 0.61–1.24)
Calcium: 8.4 mg/dL — ABNORMAL LOW (ref 8.9–10.3)
GFR, EST AFRICAN AMERICAN: 55 mL/min — AB (ref >60)
GFR, EST NON AFRICAN AMERICAN: 48 mL/min — AB (ref >60)
Glucose, Bld: 94 mg/dL (ref 65–99)
POTASSIUM: 4.3 mmol/L (ref 3.5–5.1)
SODIUM: 142 mmol/L (ref 135–145)

## 2015-10-17 LAB — GASTROINTESTINAL PANEL BY PCR, STOOL (REPLACES STOOL CULTURE)
ADENOVIRUS F40/41: NOT DETECTED
ASTROVIRUS: NOT DETECTED
CAMPYLOBACTER SPECIES: NOT DETECTED
Cryptosporidium: NOT DETECTED
Cyclospora cayetanensis: NOT DETECTED
E. coli O157: NOT DETECTED
ENTEROAGGREGATIVE E COLI (EAEC): NOT DETECTED
ENTEROPATHOGENIC E COLI (EPEC): NOT DETECTED
ENTEROTOXIGENIC E COLI (ETEC): NOT DETECTED
Entamoeba histolytica: NOT DETECTED
GIARDIA LAMBLIA: NOT DETECTED
NOROVIRUS GI/GII: NOT DETECTED
PLESIMONAS SHIGELLOIDES: NOT DETECTED
ROTAVIRUS A: NOT DETECTED
SHIGA LIKE TOXIN PRODUCING E COLI (STEC): NOT DETECTED
Salmonella species: NOT DETECTED
Sapovirus (I, II, IV, and V): NOT DETECTED
Shigella/Enteroinvasive E coli (EIEC): NOT DETECTED
Vibrio cholerae: NOT DETECTED
Vibrio species: NOT DETECTED
Yersinia enterocolitica: NOT DETECTED

## 2015-10-17 LAB — IRON AND TIBC
Iron: 45 ug/dL (ref 45–182)
SATURATION RATIOS: 14 % — AB (ref 17.9–39.5)
TIBC: 318 ug/dL (ref 250–450)
UIBC: 273 ug/dL

## 2015-10-17 LAB — CBC
HCT: 24.6 % — ABNORMAL LOW (ref 39.0–52.0)
HEMOGLOBIN: 7.6 g/dL — AB (ref 13.0–17.0)
MCH: 19.2 pg — AB (ref 26.0–34.0)
MCHC: 30.9 g/dL (ref 30.0–36.0)
MCV: 62.3 fL — ABNORMAL LOW (ref 78.0–100.0)
PLATELETS: 134 10*3/uL — AB (ref 150–400)
RBC: 3.95 MIL/uL — AB (ref 4.22–5.81)
RDW: 17.1 % — ABNORMAL HIGH (ref 11.5–15.5)
WBC: 6.2 10*3/uL (ref 4.0–10.5)

## 2015-10-17 LAB — VITAMIN B12: Vitamin B-12: 624 pg/mL (ref 180–914)

## 2015-10-17 LAB — FERRITIN: Ferritin: 16 ng/mL — ABNORMAL LOW (ref 24–336)

## 2015-10-17 LAB — RETICULOCYTES
RBC.: 4.34 MIL/uL (ref 4.22–5.81)
RETIC CT PCT: 1.8 % (ref 0.4–3.1)
Retic Count, Absolute: 78.1 10*3/uL (ref 19.0–186.0)

## 2015-10-17 LAB — FOLATE: FOLATE: 19.8 ng/mL (ref >5.9)

## 2015-10-17 NOTE — Progress Notes (Addendum)
[facility]                                                                              Patient Demographics  [name], is a 82 y.o. male, DOB - 1924/03/11, UB:[id number]  Admit date - 10/14/2015   Admitting Physician Quintella Baton, MD  Outpatient Primary MD for the patient is  Melinda Crutch, MD  Brief HPI   Patient is a 82 year old male with COPD, CAD, GERD, presented with several episodes of nausea and vomiting, no diarrhea. No hematemesis. On the day of admission, patient had not voided all day and therefore family brought him to the ER.  Assessment & Plan   Ileus -suspect post infectious -had N/V/Diarrhea 2 days prior to [place] called and discussed with [facility], Dr. Rosendo Gros, who reviewed Kub and CT, felt that contrast had passed through the colon and hence patient does not meet criteria for small bowel obstruction. Recommended to treat as ileus. - clinically improving, no N/V fpr 2days, last Bm yesterday, will start trial of clears and advance as tolerated -supportive care  Acute on chronic kidney injury - on gentle hydration, creatinine improving -stop Ivf today  Anemia -due to CKD, trended down in last few days to 7.6 today due to hemodilution, baseline 8-9 -stop IVf, check anemia panel  COPD - Currently stable, no wheezing, continue dulera  Essential hypertension - Currently stable, continue Coreg, losartan  Code Status: Full CODE STATUS  Family Communication: none at bedside  Disposition Plan: Once the ileus resolves and patient is tolerating solid diet  Time Spent in minutes   25 minutes  Procedures  CT abdomen  Consults   Called surgery, discussed with Dr. Rosendo Gros  DVT Prophylaxis  Lovenox  Medications  Scheduled Meds: . antiseptic oral rinse  7 mL Mouth Rinse q12n4p  . carvedilol  12.5 mg Oral BID WC  . chlorhexidine  15 mL Mouth Rinse BID  . docusate sodium  100 mg Oral BID  . enoxaparin (LOVENOX) injection  40 mg Subcutaneous  Q24H  . losartan  25 mg Oral Daily  . mometasone-formoterol  2 puff Inhalation BID  . pregabalin  75 mg Oral BID  . tiotropium  18 mcg Inhalation QAC breakfast   Continuous Infusions: . sodium chloride 50 mL/hr at 10/17/15 1135   PRN Meds:.ipratropium-albuterol, nitroGLYCERIN, promethazine   Antibiotics   Anti-infectives    None        Subjective:   Feels well, no N/V, had a BM last night  Objective:   Filed Vitals:   10/16/15 1746 10/16/15 2037 10/17/15 0452 10/17/15 0949  BP:  114/83 112/71   Pulse: 74 72 72   Temp:  98.2 F (36.8 C) 98.6 F (37 C)   TempSrc:  Oral Oral   Resp:  19 19   Height:      Weight:      SpO2:  98% 98% 96%    Intake/Output Summary (Last 24 hours) at 10/17/15 1148 Last data filed at 10/17/15 0651  Gross per 24 hour  Intake 1242.5 ml  Output    550 ml  Net  692.5 ml     Wt Readings from Last  3 Encounters:  10/15/15 78.699 kg (173 lb 8 oz)  09/23/15 77.384 kg (170 lb 9.6 oz)  07/19/15 77.656 kg (171 lb 3.2 oz)     Exam  General: Alert and oriented x 3, NAD  HEENT:  PERRLA, EOMI, Anicteric Sclera, mucous membranes moist.   Neck: Supple, no JVD, no masses  CVS: S1 S2 auscultated, no rubs, murmurs or gallops. Regular rate and rhythm.  Respiratory: Clear to auscultation bilaterally, no wheezing, rales or rhonchi  Abdomen: Soft, nontender, distended, + bowel sounds  Ext: no cyanosis clubbing or edema  Neuro: AAOx3, Cr N's II- XII. Strength 5/5 upper and lower extremities bilaterally  Skin: No rashes  Psych: Normal affect and demeanor, alert and oriented x3    Data Review   Micro Results Recent Results (from the past 240 hour(s))  C difficile quick scan w PCR reflex     Status: None   Collection Time: 10/16/15  5:12 PM  Result Value Ref Range Status   C Diff antigen NEGATIVE NEGATIVE Final   C Diff toxin NEGATIVE NEGATIVE Final   C Diff interpretation Negative for toxigenic C. difficile  Final    Radiology  Reports Ct Abdomen Pelvis Wo Contrast  10/15/2015  CLINICAL DATA:  Acute onset of generalized abdominal pain and diarrhea. Initial encounter. EXAM: CT ABDOMEN AND PELVIS WITHOUT CONTRAST TECHNIQUE: Multidetector CT imaging of the abdomen and pelvis was performed following the standard protocol without IV contrast. COMPARISON:  CT of the abdomen and pelvis performed 09/12/2012 FINDINGS: Minimal bibasilar atelectasis or scarring is noted. Diffuse coronary artery calcifications are seen. There is marked thinning of the myocardium at the ventricular apex, with associated calcification and mild focal bulging, reflecting prior myocardial infarction. Calcification is noted at the aortic and mitral valves. Scattered calcified granulomata are noted within the liver. The liver and spleen are otherwise grossly unremarkable. The patient is status post cholecystectomy, with clips noted along the gallbladder fossa. The pancreas and adrenal glands are grossly unremarkable. A few scattered bilateral renal cysts are seen, measuring up to 3.8 cm in size. There is question of minimal associated calcification at a small right renal cyst. Nonspecific perinephric stranding is noted bilaterally. Minimal right-sided hydronephrosis is nonspecific. No definite distal obstruction is seen. Scattered vascular calcifications are seen at the renal hila. No free fluid is identified. The small bowel is unremarkable in appearance. The stomach is within normal limits. No acute vascular abnormalities are seen. Relatively diffuse calcification is noted along the abdominal aorta and its branches. There is minimal ectasia of the infrarenal abdominal aorta, without evidence of aneurysmal dilatation. The patient is status post appendectomy. The colon is largely filled with fluid, corresponding to the patient's diarrhea. The bladder is mildly distended and grossly unremarkable. A reservoir is noted at the right hemipelvis. Scattered brachytherapy seeds are  noted at the prostate bed. No inguinal lymphadenopathy is seen. No acute osseous abnormalities are identified. Multilevel vacuum phenomenon is noted along the lumbar spine. There is minimal grade 1 retrolisthesis of L3 on L4, and minimal grade 1 anterolisthesis of L4 on L5, reflecting underlying facet disease. IMPRESSION: 1. Colon largely filled with fluid, corresponding to the patient's diarrhea. 2. Marked thinning of the myocardium at the ventricular apex, with associated calcification and mild focal bulging, reflecting prior myocardial infarction. 3. Diffuse coronary artery calcifications seen. Calcification at the aortic and mitral valves. 4. Few scattered bilateral renal cysts noted. Minimal nonspecific right-sided hydronephrosis, without evidence of distal obstruction. 5. Relatively diffuse calcification along the  abdominal aorta and its branches. 6. Mild degenerative change along the lumbar spine. Electronically Signed   By: Garald Balding M.D.   On: 10/15/2015 06:13   X-ray Chest Pa And Lateral  09/20/2015  CLINICAL DATA:  Syncope and weakness.  COPD. EXAM: CHEST  2 VIEW COMPARISON:  01/29/2015. FINDINGS: The heart size and mediastinal contours are within normal limits. Both lungs are clear. The visualized skeletal structures are unremarkable. Hyperinflation without pneumothorax. Surgical clips RIGHT neck stable. Thoracic atherosclerosis. Thoracic spine degenerative change. Similar appearance to priors. IMPRESSION: COPD, no active disease. Electronically Signed   By: Staci Righter M.D.   On: 09/20/2015 22:18   Ct Head Wo Contrast  09/20/2015  CLINICAL DATA:  82 year old male with fall and trauma to the head. EXAM: CT HEAD WITHOUT CONTRAST CT CERVICAL SPINE WITHOUT CONTRAST TECHNIQUE: Multidetector CT imaging of the head and cervical spine was performed following the standard protocol without intravenous contrast. Multiplanar CT image reconstructions of the cervical spine were also generated. COMPARISON:   CT dated 11/20/2013 FINDINGS: CT HEAD FINDINGS There is slight prominence of the ventricles and sulci compatible with age-related volume loss. Mild periventricular and deep white matter hypodensities represent chronic microvascular ischemic changes. Small old left basal ganglia lacunar infarct. There is no intracranial hemorrhage. No mass effect or midline shift identified. There is partial opacification of the right frontal sinus. No air-fluid level. The remainder of the paranasal sinuses and mastoid air cells are well aerated. The calvarium is intact. CT CERVICAL SPINE FINDINGS There is no acute fracture or subluxation of the cervical spine.There is osteopenia with multilevel degenerative changes. There is disc desiccation and narrowing most prominent at C5-C6, and C6-C7.The odontoid and spinous processes are intact.There is normal anatomic alignment of the C1-C2 lateral masses. The visualized soft tissues appear unremarkable. Multiple surgical clips noted in the right side of the neck possibly related to carotid endarterectomy. Right carotid bulb calcific plaques noted. IMPRESSION: No acute intracranial hemorrhage. Mild age-related atrophy and chronic microvascular ischemic disease. No acute/traumatic cervical spine pathology. Electronically Signed   By: Anner Crete M.D.   On: 09/20/2015 19:08   Ct Cervical Spine Wo Contrast  09/20/2015  CLINICAL DATA:  82 year old male with fall and trauma to the head. EXAM: CT HEAD WITHOUT CONTRAST CT CERVICAL SPINE WITHOUT CONTRAST TECHNIQUE: Multidetector CT imaging of the head and cervical spine was performed following the standard protocol without intravenous contrast. Multiplanar CT image reconstructions of the cervical spine were also generated. COMPARISON:  CT dated 11/20/2013 FINDINGS: CT HEAD FINDINGS There is slight prominence of the ventricles and sulci compatible with age-related volume loss. Mild periventricular and deep white matter hypodensities represent  chronic microvascular ischemic changes. Small old left basal ganglia lacunar infarct. There is no intracranial hemorrhage. No mass effect or midline shift identified. There is partial opacification of the right frontal sinus. No air-fluid level. The remainder of the paranasal sinuses and mastoid air cells are well aerated. The calvarium is intact. CT CERVICAL SPINE FINDINGS There is no acute fracture or subluxation of the cervical spine.There is osteopenia with multilevel degenerative changes. There is disc desiccation and narrowing most prominent at C5-C6, and C6-C7.The odontoid and spinous processes are intact.There is normal anatomic alignment of the C1-C2 lateral masses. The visualized soft tissues appear unremarkable. Multiple surgical clips noted in the right side of the neck possibly related to carotid endarterectomy. Right carotid bulb calcific plaques noted. IMPRESSION: No acute intracranial hemorrhage. Mild age-related atrophy and chronic microvascular ischemic disease. No  acute/traumatic cervical spine pathology. Electronically Signed   By: Anner Crete M.D.   On: 09/20/2015 19:08   Dg Abd 2 Views  10/17/2015  CLINICAL DATA:  Follow-up small bowel obstruction EXAM: ABDOMEN - 2 VIEW COMPARISON:  10/16/2015 FINDINGS: Oral contrast is seen primarily throughout the large bowel with air-fluid levels throughout the large bowel. Contrast is seen into the distal sigmoid. Large bowel is relatively decompressed as compared to small bowel, with numerous distended loops of mid abdominal small bowel again identified measuring up to about 4.5 cm in diameter. Small bowel air-fluid levels are stable. There is opacity at the left lung base most consistent with atelectasis. IMPRESSION: Stable findings of small bowel obstruction Electronically Signed   By: Skipper Cliche M.D.   On: 10/17/2015 08:52   Dg Abd Acute W/chest  10/14/2015  CLINICAL DATA:  Acute onset of generalized abdominal bloating. Initial encounter.  EXAM: DG ABDOMEN ACUTE W/ 1V CHEST COMPARISON:  Chest radiograph performed 09/20/2015 FINDINGS: The lungs are well-aerated and clear. There is no evidence of focal opacification, pleural effusion or pneumothorax. The cardiomediastinal silhouette is within normal limits. There is dilatation of small bowel loops up to 5.8 cm in maximal diameter, with associated air-fluid levels, concerning for small bowel obstruction. Residual air is still seen in the colon. No free intra-abdominal air is identified on the provided upright view. Clips are noted within the right upper quadrant, reflecting prior cholecystectomy. Clips are also noted at the right side of the neck. No acute osseous abnormalities are seen; the sacroiliac joints are unremarkable in appearance. IMPRESSION: 1. Dilatation of small bowel loops up to 5.8 cm in maximal diameter, with air-fluid levels, concerning for relatively high-grade small bowel obstruction. 2. No acute cardiopulmonary process seen. These results were called by telephone at the time of interpretation on 10/14/2015 at 11:29 pm to [facility] in the [facility] ER, who verbally acknowledged these results. Electronically Signed   By: Garald Balding M.D.   On: 10/14/2015 23:30   Dg Abd Portable 1v  10/16/2015  CLINICAL DATA:  Small bowel obstruction. EXAM: PORTABLE ABDOMEN - 1 VIEW COMPARISON:  CT scan October 15, 2015 FINDINGS: There is contrast within the decompressed colon. Dilated loops of small bowel are seen diffusely, most prominent in the central and left abdomen. No free air, portal venous gas, or pneumatosis identified. No other acute abnormalities. IMPRESSION: Small bowel obstruction. Electronically Signed   By: Dorise Bullion III M.D   On: 10/16/2015 09:24    CBC  Recent Labs Lab 10/14/15 1801 10/15/15 0802 10/16/15 0632 10/17/15 0530  WBC 6.3 5.4 6.2 6.2  HGB 9.2* 8.3* 8.0* 7.6*  HCT 31.3* 27.2* 25.8* 24.6*  PLT 179 132* 133* 134*  MCV 62.4* 61.0* 61.7* 62.3*  MCH 18.3*  18.6* 19.1* 19.2*  MCHC 29.4* 30.5 31.0 30.9  RDW 16.8* 16.6* 17.3* 17.1*    Chemistries   Recent Labs Lab 10/14/15 1801 10/15/15 0802 10/16/15 0632 10/17/15 0530  NA 140 137 139 142  K 4.4 3.8 4.1 4.3  CL 106 110 111 114*  CO2 24 20* 19* 21*  GLUCOSE 130* 100* 98 94  BUN 52* 51* 28* 21*  CREATININE 2.15* 1.80* 1.30* 1.28*  CALCIUM 9.2 8.3* 8.6* 8.4*   ------------------------------------------------------------------------------------------------------------------ estimated creatinine clearance is 42.9 mL/min (by C-G formula based on Cr of 1.28). ------------------------------------------------------------------------------------------------------------------ No results for input(s): HGBA1C in the last 72 hours. ------------------------------------------------------------------------------------------------------------------ No results for input(s): CHOL, HDL, LDLCALC, TRIG, CHOLHDL, LDLDIRECT in the last 72  hours. ------------------------------------------------------------------------------------------------------------------ No results for input(s): TSH, T4TOTAL, T3FREE, THYROIDAB in the last 72 hours.  Invalid input(s): FREET3 ------------------------------------------------------------------------------------------------------------------ No results for input(s): VITAMINB12, FOLATE, FERRITIN, TIBC, IRON, RETICCTPCT in the last 72 hours.  Coagulation profile No results for input(s): INR, PROTIME in the last 168 hours.  No results for input(s): DDIMER in the last 72 hours.  Cardiac Enzymes No results for input(s): CKMB, TROPONINI, MYOGLOBIN in the last 168 hours.  Invalid input(s): CK ------------------------------------------------------------------------------------------------------------------ Invalid input(s): POCBNP  No results for input(s): GLUCAP in the last 72 hours.   Domenic Polite M.D. [facility] 10/17/2015, 11:48 AM  Pager: [phone] Between  7am to 7pm - call Pager - [phone]  After 7pm go to [email or web address] - password [email or web address]  Call night coverage person covering after 7pm

## 2015-10-18 ENCOUNTER — Ambulatory Visit: Payer: Medicare Other | Admitting: Internal Medicine

## 2015-10-18 DIAGNOSIS — K567 Ileus, unspecified: Principal | ICD-10-CM

## 2015-10-18 LAB — BASIC METABOLIC PANEL
Anion gap: 9 (ref 5–15)
Anion gap: 9 (ref 5–15)
BUN: 18 mg/dL (ref 6–20)
BUN: 16 mg/dL (ref 6–20)
CALCIUM: 8.7 mg/dL — AB (ref 8.9–10.3)
CALCIUM: 8.5 mg/dL — AB (ref 8.9–10.3)
CHLORIDE: 111 mmol/L (ref 101–111)
CO2: 20 mmol/L — ABNORMAL LOW (ref 22–32)
CO2: 22 mmol/L (ref 22–32)
CREATININE: 1.28 mg/dL — AB (ref 0.61–1.24)
CREATININE: 1.28 mg/dL — AB (ref 0.61–1.24)
Chloride: 115 mmol/L — ABNORMAL HIGH (ref 101–111)
GFR calc Af Amer: 55 mL/min — ABNORMAL LOW (ref >60)
GFR calc non Af Amer: 48 mL/min — ABNORMAL LOW (ref >60)
GFR, EST AFRICAN AMERICAN: 55 mL/min — AB (ref >60)
GFR, EST NON AFRICAN AMERICAN: 48 mL/min — AB (ref >60)
GLUCOSE: 143 mg/dL — AB (ref 65–99)
Glucose, Bld: 150 mg/dL — ABNORMAL HIGH (ref 65–99)
Potassium: 4.2 mmol/L (ref 3.5–5.1)
Potassium: 4.2 mmol/L (ref 3.5–5.1)
SODIUM: 144 mmol/L (ref 135–145)
Sodium: 142 mmol/L (ref 135–145)

## 2015-10-18 LAB — CBC
HCT: 25.5 % — ABNORMAL LOW (ref 39.0–52.0)
Hemoglobin: 7.6 g/dL — ABNORMAL LOW (ref 13.0–17.0)
MCH: 18.5 pg — ABNORMAL LOW (ref 26.0–34.0)
MCHC: 29.8 g/dL — ABNORMAL LOW (ref 30.0–36.0)
MCV: 62 fL — ABNORMAL LOW (ref 78.0–100.0)
Platelets: 130 10*3/uL — ABNORMAL LOW (ref 150–400)
RBC: 4.11 MIL/uL — ABNORMAL LOW (ref 4.22–5.81)
RDW: 17.2 % — ABNORMAL HIGH (ref 11.5–15.5)
WBC: 7.1 10*3/uL (ref 4.0–10.5)

## 2015-10-18 MED ORDER — DIPHENHYDRAMINE HCL 25 MG PO CAPS
25.0000 mg | ORAL_CAPSULE | Freq: Once | ORAL | Status: AC
  Administered 2015-10-18: 25 mg via ORAL
  Filled 2015-10-18: qty 1

## 2015-10-18 MED ORDER — FERROUS SULFATE 325 (65 FE) MG PO TABS
325.0000 mg | ORAL_TABLET | Freq: Two times a day (BID) | ORAL | Status: DC
  Administered 2015-10-18 – 2015-10-23 (×10): 325 mg via ORAL
  Filled 2015-10-18 (×10): qty 1

## 2015-10-18 NOTE — Progress Notes (Signed)
Patient was able to void but unsure about the accurate measurement due to patient having incontinent episode in peri pad. Observed urine in pad. Bladder scanned patient afterward and measurement showed only 64 cc post residual. Will continue to monitor patient and hold off on in and out cath unless needed.

## 2015-10-18 NOTE — Care Management Important Message (Signed)
Important Message  Patient Details  Name: KEYONTAE BALUYOT MRN: [id number] Date of Birth: 10/16/1923   Medicare Important Message Given:  Yes    Adaleen Hulgan P Auther Lyerly 10/18/2015, 3:18 PM

## 2015-10-18 NOTE — Progress Notes (Signed)
Physical Therapy Treatment Patient Details Name: [name] MRN: [id number] DOB: 03-17-1924 Today's Date: 2015-10-25    History of Present Illness Patient is a 82 year old male with COPD, CAD, GERD, presented with several episodes of nausea and vomiting. Admitted for ileus, and acute on chronic kidney injury.    PT Comments    Pt agreeable to ambulate and increase activity today. However with incontinent urine sitting while bending to don bil shoes and incontinent of bowel upon standing. Repeated standing trial to change linens and don briefs with pad for gait. Pt declined HEP or stairs today due to fatigue after donning shoes and increased ambulation. Will continue to follow to maximize independence and function.   Follow Up Recommendations  Home health PT;Supervision - Intermittent     Equipment Recommendations       Recommendations for Other Services       Precautions / Restrictions Precautions Precautions: Fall Precaution Comments: bil LE AFO shoes, incontinent B&B Restrictions Weight Bearing Restrictions: No    Mobility  Bed Mobility               General bed mobility comments: received in chair  Transfers Overall transfer level: Needs assistance   Transfers: Sit to/from Stand Sit to Stand: Supervision         General transfer comment: cues for hand placement x 2 trials  Ambulation/Gait Ambulation/Gait assistance: Supervision Ambulation Distance (Feet): 300 Feet Assistive device: Rolling walker (2 wheeled) Gait Pattern/deviations: Step-through pattern;Decreased stride length;Trunk flexed   Gait velocity interpretation: Below normal speed for age/gender General Gait Details: VC for walker placement for proximity, cues for posture. Pt declined stairs this session, fatigued after gait   Stairs            Wheelchair Mobility    Modified Rankin (Stroke Patients Only)       Balance                                    Cognition  Arousal/Alertness: Awake/alert Behavior During Therapy: WFL for tasks assessed/performed Overall Cognitive Status: Within Functional Limits for tasks assessed           Safety/Judgement: Decreased awareness of deficits          Exercises      General Comments        Pertinent Vitals/Pain Pain Assessment: No/denies pain    Home Living                      Prior Function            PT Goals (current goals can now be found in the care plan section) Progress towards PT goals: Progressing toward goals    Frequency       PT Plan Current plan remains appropriate    Co-evaluation             End of Session Equipment Utilized During Treatment: Gait belt Activity Tolerance: Patient tolerated treatment well Patient left: in chair;with call bell/phone within reach     Time: 1129-1148 PT Time Calculation (min) (ACUTE ONLY): 19 min  Charges:  $Gait Training: 8-22 mins                    G Codes:      Melford Aase Oct 25, 2015, 11:53 AM Elwyn Reach, [place]

## 2015-10-18 NOTE — Progress Notes (Addendum)
Patient informed this RN that his pads are wet and needs a new pads.  Patient states, "I cannot control my urination since I do not know when it is coming."  Able to obtain about 100 mL of urine when patient stood up.

## 2015-10-18 NOTE — Care Management Note (Signed)
Case Management Note  Patient Details  Name: HARRIS CAPELLAN MRN: [id number] Date of Birth: [birth year]  Subjective/Objective:                    Action/Plan:   Expected Discharge Date:                  Expected Discharge Plan:  [place]  In-House Referral:     Discharge planning Services  CM Consult  Post Acute Care Choice:  Home Health Choice offered to:  Patient  DME Arranged:    DME Agency:     HH Arranged:  PT, RN, OT HH Agency:  [place]  Status of Service:  Completed, signed off  Medicare Important Message Given:  Yes Date Medicare IM Given:    Medicare IM give by:    Date Additional Medicare IM Given:    Additional Medicare Important Message give by:     If discussed at [place] of Stay Meetings, dates discussed:    Additional Comments:  Marilu Favre, RN 10/18/2015, 4:09 PM

## 2015-10-18 NOTE — Progress Notes (Addendum)
[facility] PROGRESS NOTE  ZACKARIYA BIHL [id number] DOB: 01-Jan-1924 DOA: 10/14/2015 PCP:  Melinda Crutch, MD  Brief Summary  Patient is a 82 year old male with COPD, CAD, GERD, presented with several episodes of nausea and vomiting, no diarrhea. No hematemesis. On the day of admission, patient had not voided all day and therefore family brought him to the ER.  Assessment/Plan  Ileus, suspect post infectious since he had N/V/Diarrhea 2 days prior to [place] called and discussed with [facility], Dr. Rosendo Gros, who reviewed Kub and CT, felt that contrast had passed through the colon and hence patient did not meet criteria for small bowel obstruction. Recommended to treat as ileus. - clinically improving, no N/V for last 3 days - diarrhea x 3 overnight with stool incontinence - at risk for recurrent dehydration and patient does not want to eat "because it makes me have diarrhea" -  Encouraged PO -  Goal for discharge is able to maintain PO and diarrhea slowing down/less incontinence -  C. Diff PCR and GI pathogen panels both negative  Acute on chronic kidney injury, resolved with IVF  Foley catheter likely placed due to decreased uop at admission -  D/c foley -  Voiding trial  -  If unable to void, I/O x 3, otherwise replace foley and will start flomax  Iron deficiency anemia with ferritin of 16 -  Start iron supplementation -  Folate, b12, TSH wnl -  Defer conversation about further work up to his PCP  COPD - Currently stable, no wheezing, continue dulera  Essential hypertension - Currently stable, continue Coreg, losartan   Diet:  soft Access:  PIV IVF:  off Proph:  lovenox  Code Status: full Family Communication: patient alone Disposition Plan: pending improvement in diarrhea    Consultants:  General surgery by phone as above  Procedures:  KUB  Antibiotics:  none   HPI/Subjective:  Three episodes of diarrhea with some stool incontinence overnight.  Only  ate a few bites of breakfast.  Denies abdominal pain but still distended    Objective: Filed Vitals:   10/17/15 2248 10/18/15 0545 10/18/15 0807 10/18/15 1500  BP: 127/55 114/53  100/75  Pulse: 73 74  74  Temp: 99.1 F (37.3 C) 98.8 F (37.1 C)  97.6 F (36.4 C)  TempSrc: Oral   Oral  Resp: 17 16  18   Height:      Weight:      SpO2: 97% 95% 94% 98%    Intake/Output Summary (Last 24 hours) at 10/18/15 1559 Last data filed at 10/18/15 1500  Gross per 24 hour  Intake    480 ml  Output   1575 ml  Net  -1095 ml   Filed Weights   10/14/15 1748 10/15/15 0203  Weight: 77.111 kg (170 lb) 78.699 kg (173 lb 8 oz)   Body mass index is 23.53 kg/(m^2).  Exam:   General:  Adult male, No acute distress  HEENT:  NCAT, MMM  Cardiovascular:  RRR, nl S1, S2 no mrg, 2+ pulses, warm extremities  Respiratory:  CTAB, no increased WOB  Abdomen:   Higher pitched but present BS, soft, moderately distended but not tense, nontender  MSK:   Normal tone and bulk, no Bachmann  Neuro:  Grossly intact  Data Reviewed: Basic Metabolic Panel:  Recent Labs Lab 10/15/15 0802 10/16/15 0632 10/17/15 0530 10/17/15 2340 10/18/15 0811  NA 137 139 142 142 144  K 3.8 4.1 4.3 4.2 4.2  CL 110 111 114*  111 115*  CO2 20* 19* 21* 22 20*  GLUCOSE 100* 98 94 150* 143*  BUN 51* 28* 21* 18 16  CREATININE 1.80* 1.30* 1.28* 1.28* 1.28*  CALCIUM 8.3* 8.6* 8.4* 8.7* 8.5*   Liver Function Tests: No results for input(s): AST, ALT, ALKPHOS, BILITOT, PROT, ALBUMIN in the last 168 hours. No results for input(s): LIPASE, AMYLASE in the last 168 hours. No results for input(s): AMMONIA in the last 168 hours. CBC:  Recent Labs Lab 10/14/15 1801 10/15/15 0802 10/16/15 0632 10/17/15 0530 10/18/15 0811  WBC 6.3 5.4 6.2 6.2 7.1  HGB 9.2* 8.3* 8.0* 7.6* 7.6*  HCT 31.3* 27.2* 25.8* 24.6* 25.5*  MCV 62.4* 61.0* 61.7* 62.3* 62.0*  PLT 179 132* 133* 134* 130*    Recent Results (from the past 240 hour(s))   Gastrointestinal Panel by PCR , Stool     Status: None   Collection Time: 10/16/15  5:12 PM  Result Value Ref Range Status   Campylobacter species NOT DETECTED NOT DETECTED Final   Plesimonas shigelloides NOT DETECTED NOT DETECTED Final   Salmonella species NOT DETECTED NOT DETECTED Final   Yersinia enterocolitica NOT DETECTED NOT DETECTED Final   Vibrio species NOT DETECTED NOT DETECTED Final   Vibrio cholerae NOT DETECTED NOT DETECTED Final   Enteroaggregative E coli (EAEC) NOT DETECTED NOT DETECTED Final   Enteropathogenic E coli (EPEC) NOT DETECTED NOT DETECTED Final   Enterotoxigenic E coli (ETEC) NOT DETECTED NOT DETECTED Final   Shiga like toxin producing E coli (STEC) NOT DETECTED NOT DETECTED Final   E. coli O157 NOT DETECTED NOT DETECTED Final   Shigella/Enteroinvasive E coli (EIEC) NOT DETECTED NOT DETECTED Final   Cryptosporidium NOT DETECTED NOT DETECTED Final   Cyclospora cayetanensis NOT DETECTED NOT DETECTED Final   Entamoeba histolytica NOT DETECTED NOT DETECTED Final   Giardia lamblia NOT DETECTED NOT DETECTED Final   Adenovirus F40/41 NOT DETECTED NOT DETECTED Final   Astrovirus NOT DETECTED NOT DETECTED Final   Norovirus GI/GII NOT DETECTED NOT DETECTED Final   Rotavirus A NOT DETECTED NOT DETECTED Final   Sapovirus (I, II, IV, and V) NOT DETECTED NOT DETECTED Final  C difficile quick scan w PCR reflex     Status: None   Collection Time: 10/16/15  5:12 PM  Result Value Ref Range Status   C Diff antigen NEGATIVE NEGATIVE Final   C Diff toxin NEGATIVE NEGATIVE Final   C Diff interpretation Negative for toxigenic C. difficile  Final     Studies: Dg Abd 2 Views  10/17/2015  CLINICAL DATA:  Follow-up small bowel obstruction EXAM: ABDOMEN - 2 VIEW COMPARISON:  10/16/2015 FINDINGS: Oral contrast is seen primarily throughout the large bowel with air-fluid levels throughout the large bowel. Contrast is seen into the distal sigmoid. Large bowel is relatively decompressed  as compared to small bowel, with numerous distended loops of mid abdominal small bowel again identified measuring up to about 4.5 cm in diameter. Small bowel air-fluid levels are stable. There is opacity at the left lung base most consistent with atelectasis. IMPRESSION: Stable findings of small bowel obstruction Electronically Signed   By: Skipper Cliche M.D.   On: 10/17/2015 08:52    Scheduled Meds: . carvedilol  12.5 mg Oral BID WC  . docusate sodium  100 mg Oral BID  . enoxaparin (LOVENOX) injection  40 mg Subcutaneous Q24H  . losartan  25 mg Oral Daily  . mometasone-formoterol  2 puff Inhalation BID  . pregabalin  75  mg Oral BID  . tiotropium  18 mcg Inhalation QAC breakfast   Continuous Infusions:   Active Problems:   HYPERTENSION, BENIGN   CAD, NATIVE VESSEL   COPD GOLD III   Abnormality of gait   Diarrhea    Time spent: 30 min    Aishani Kalis, [facility]  [facility] Pager [phone]. If 7PM-7AM, please contact night-coverage at [email or web address], password [facility] 10/18/2015, 3:59 PM  LOS: 3 days

## 2015-10-19 DIAGNOSIS — D509 Iron deficiency anemia, unspecified: Secondary | ICD-10-CM

## 2015-10-19 DIAGNOSIS — N179 Acute kidney failure, unspecified: Secondary | ICD-10-CM

## 2015-10-19 DIAGNOSIS — K567 Ileus, unspecified: Secondary | ICD-10-CM | POA: Diagnosis present

## 2015-10-19 DIAGNOSIS — N183 Chronic kidney disease, stage 3 unspecified: Secondary | ICD-10-CM | POA: Diagnosis present

## 2015-10-19 DIAGNOSIS — J449 Chronic obstructive pulmonary disease, unspecified: Secondary | ICD-10-CM | POA: Diagnosis present

## 2015-10-19 DIAGNOSIS — I10 Essential (primary) hypertension: Secondary | ICD-10-CM | POA: Diagnosis present

## 2015-10-19 LAB — CBC
HCT: 24.8 % — ABNORMAL LOW (ref 39.0–52.0)
Hemoglobin: 7.8 g/dL — ABNORMAL LOW (ref 13.0–17.0)
MCH: 19.4 pg — AB (ref 26.0–34.0)
MCHC: 31.5 g/dL (ref 30.0–36.0)
MCV: 61.7 fL — AB (ref 78.0–100.0)
PLATELETS: 148 10*3/uL — AB (ref 150–400)
RBC: 4.02 MIL/uL — ABNORMAL LOW (ref 4.22–5.81)
RDW: 17.4 % — AB (ref 11.5–15.5)
WBC: 6.7 10*3/uL (ref 4.0–10.5)

## 2015-10-19 LAB — BASIC METABOLIC PANEL
Anion gap: 8 (ref 5–15)
BUN: 17 mg/dL (ref 6–20)
CALCIUM: 8.4 mg/dL — AB (ref 8.9–10.3)
CO2: 20 mmol/L — ABNORMAL LOW (ref 22–32)
CREATININE: 1.39 mg/dL — AB (ref 0.61–1.24)
Chloride: 112 mmol/L — ABNORMAL HIGH (ref 101–111)
GFR calc Af Amer: 50 mL/min — ABNORMAL LOW (ref >60)
GFR, EST NON AFRICAN AMERICAN: 43 mL/min — AB (ref >60)
GLUCOSE: 111 mg/dL — AB (ref 65–99)
POTASSIUM: 4 mmol/L (ref 3.5–5.1)
Sodium: 140 mmol/L (ref 135–145)

## 2015-10-19 MED ORDER — DIPHENHYDRAMINE HCL 50 MG/ML IJ SOLN
25.0000 mg | INTRAMUSCULAR | Status: DC | PRN
  Administered 2015-10-19 (×2): 25 mg via INTRAVENOUS
  Filled 2015-10-19 (×2): qty 1

## 2015-10-19 MED ORDER — HYDROCORTISONE 1 % EX CREA
TOPICAL_CREAM | Freq: Three times a day (TID) | CUTANEOUS | Status: DC
Start: 2015-10-19 — End: 2015-10-23
  Administered 2015-10-19 – 2015-10-22 (×8): via TOPICAL
  Administered 2015-10-23: 1 via TOPICAL
  Filled 2015-10-19: qty 28

## 2015-10-19 MED ORDER — DIPHENHYDRAMINE HCL 50 MG/ML IJ SOLN
25.0000 mg | Freq: Four times a day (QID) | INTRAMUSCULAR | Status: DC | PRN
  Administered 2015-10-19: 25 mg via INTRAVENOUS
  Filled 2015-10-19: qty 1

## 2015-10-19 NOTE — Progress Notes (Signed)
Patient complaining of anorexia and itching.  Has a raised, pruritic rash on upper posterior torso.  Has active bowel sounds RUQ and is passing flatus. He states he is unwilling to eat for fear of having loose stools again.  Encouraged to eat and drink by staff.

## 2015-10-19 NOTE — Progress Notes (Signed)
Progress Note   [name] [id number] DOB: 1924-04-01 DOA: 10/14/2015 PCP:  Melinda Crutch, MD   Brief Narrative:   [name] is an 82 y.o. male with COPD, CAD, GERD, Who was admitted on 10/14/15 with several episodes of nausea and vomiting, no diarrhea. No hematemesis. On the day of admission, patient had not voided all day and therefore family brought him to the ER.  Assessment/Plan:   Principal problem:  Ileus - Suspect post infectious since he had N/V/Diarrhea 2 days prior to admission. - Case discussed with surgeon who reviewed CT scans. Recommended to treat as ileus. - He is clinically improving. Nausea/vomiting resolved but still has some diarrhea. -Goal for discharge is able to maintain PO and diarrhea slowing down/less incontinence. -C. Diff PCR and GI pathogen panels both negative.  Active problems:  Rash - Benadryl when necessary. Hydrocortisone cream to pruritic eruption on back.  Acute on chronic kidney injury - Resolved with IVF.  Iron deficiency anemia - Ferritin = 16. -Continue  iron supplementation. -Folate, b12, TSH wnl. -Defer conversation about further work up to his PCP.  COPD - Currently stable, no wheezing, continue Spiriva and dulera.  Essential hypertension/CAD - Currently stable, continue Coreg, losartan.  DVT Prophylaxis - Lovenox ordered.  Family Communication/Anticipated D/C date and plan/Code Status   Family Communication: No family at the bedside. Disposition Plan/date: Home 10/20/15 if no further black stools. Reports that he has sitters. Code Status: Full code.  IV Access:    Peripheral IV   Procedures and diagnostic studies:   Ct Abdomen Pelvis Wo Contrast  10/15/2015  CLINICAL DATA:  Acute onset of generalized abdominal pain and diarrhea. Initial encounter. EXAM: CT ABDOMEN AND PELVIS WITHOUT CONTRAST TECHNIQUE: Multidetector CT imaging of the abdomen and pelvis was performed following the standard protocol without IV  contrast. COMPARISON:  CT of the abdomen and pelvis performed 09/12/2012 FINDINGS: Minimal bibasilar atelectasis or scarring is noted. Diffuse coronary artery calcifications are seen. There is marked thinning of the myocardium at the ventricular apex, with associated calcification and mild focal bulging, reflecting prior myocardial infarction. Calcification is noted at the aortic and mitral valves. Scattered calcified granulomata are noted within the liver. The liver and spleen are otherwise grossly unremarkable. The patient is status post cholecystectomy, with clips noted along the gallbladder fossa. The pancreas and adrenal glands are grossly unremarkable. A few scattered bilateral renal cysts are seen, measuring up to 3.8 cm in size. There is question of minimal associated calcification at a small right renal cyst. Nonspecific perinephric stranding is noted bilaterally. Minimal right-sided hydronephrosis is nonspecific. No definite distal obstruction is seen. Scattered vascular calcifications are seen at the renal hila. No free fluid is identified. The small bowel is unremarkable in appearance. The stomach is within normal limits. No acute vascular abnormalities are seen. Relatively diffuse calcification is noted along the abdominal aorta and its branches. There is minimal ectasia of the infrarenal abdominal aorta, without evidence of aneurysmal dilatation. The patient is status post appendectomy. The colon is largely filled with fluid, corresponding to the patient's diarrhea. The bladder is mildly distended and grossly unremarkable. A reservoir is noted at the right hemipelvis. Scattered brachytherapy seeds are noted at the prostate bed. No inguinal lymphadenopathy is seen. No acute osseous abnormalities are identified. Multilevel vacuum phenomenon is noted along the lumbar spine. There is minimal grade 1 retrolisthesis of L3 on L4, and minimal grade 1 anterolisthesis of L4 on L5, reflecting underlying facet  disease.  IMPRESSION: 1. Colon largely filled with fluid, corresponding to the patient's diarrhea. 2. Marked thinning of the myocardium at the ventricular apex, with associated calcification and mild focal bulging, reflecting prior myocardial infarction. 3. Diffuse coronary artery calcifications seen. Calcification at the aortic and mitral valves. 4. Few scattered bilateral renal cysts noted. Minimal nonspecific right-sided hydronephrosis, without evidence of distal obstruction. 5. Relatively diffuse calcification along the abdominal aorta and its branches. 6. Mild degenerative change along the lumbar spine. Electronically Signed   By: Garald Balding M.D.   On: 10/15/2015 06:13   Dg Abd Acute W/chest  10/14/2015  CLINICAL DATA:  Acute onset of generalized abdominal bloating. Initial encounter. EXAM: DG ABDOMEN ACUTE W/ 1V CHEST COMPARISON:  Chest radiograph performed 09/20/2015 FINDINGS: The lungs are well-aerated and clear. There is no evidence of focal opacification, pleural effusion or pneumothorax. The cardiomediastinal silhouette is within normal limits. There is dilatation of small bowel loops up to 5.8 cm in maximal diameter, with associated air-fluid levels, concerning for small bowel obstruction. Residual air is still seen in the colon. No free intra-abdominal air is identified on the provided upright view. Clips are noted within the right upper quadrant, reflecting prior cholecystectomy. Clips are also noted at the right side of the neck. No acute osseous abnormalities are seen; the sacroiliac joints are unremarkable in appearance. IMPRESSION: 1. Dilatation of small bowel loops up to 5.8 cm in maximal diameter, with air-fluid levels, concerning for relatively high-grade small bowel obstruction. 2. No acute cardiopulmonary process seen. These results were called by telephone at the time of interpretation on 10/14/2015 at 11:29 pm to [facility] in the [facility] ER, who verbally acknowledged these results.  Electronically Signed   By: Garald Balding M.D.   On: 10/14/2015 23:30     Medical Consultants:    None.  Anti-Infectives:   Anti-infectives    None      Subjective:    [name] tells me he is upset and feels like his complaints have not been taken seriously. He tells me he had a large black stool prior to admission and wants to know what I think caused it. He has not had any stools in several days per his report. No current nausea or vomiting, but has not yet ate lunch. Says he ate a good breakfast. Has a pruritic rash to his torso, which was present on admission.  Objective:    Filed Vitals:   10/18/15 1500 10/18/15 1844 10/18/15 2304 10/19/15 0543  BP: 100/75 122/63 121/46 121/48  Pulse: 74 70 67 80  Temp: 97.6 F (36.4 C)  98 F (36.7 C) 98 F (36.7 C)  TempSrc: Oral  Oral Oral  Resp: 18  18 18   Height:      Weight:      SpO2: 98%  100% 98%    Intake/Output Summary (Last 24 hours) at 10/19/15 0959 Last data filed at 10/18/15 2200  Gross per 24 hour  Intake    720 ml  Output    100 ml  Net    620 ml   Filed Weights   10/14/15 1748 10/15/15 0203  Weight: 77.111 kg (170 lb) 78.699 kg (173 lb 8 oz)    Exam: Gen:  NAD Cardiovascular:  RRR, No M/R/G Respiratory:  Lungs With a few bibasilar crackles Gastrointestinal:  Abdomen soft, NT/ND, + BS Extremities:  No C/E/C   Data Reviewed:    Labs: Basic Metabolic Panel:  Recent Labs Lab 10/16/15 [id number] 10/17/15  0530 10/17/15 2340 10/18/15 0811 10/19/15 0607  NA 139 142 142 144 140  K 4.1 4.3 4.2 4.2 4.0  CL 111 114* 111 115* 112*  CO2 19* 21* 22 20* 20*  GLUCOSE 98 94 150* 143* 111*  BUN 28* 21* 18 16 17   CREATININE 1.30* 1.28* 1.28* 1.28* 1.39*  CALCIUM 8.6* 8.4* 8.7* 8.5* 8.4*   GFR Estimated Creatinine Clearance: 39.5 mL/min (by C-G formula based on Cr of 1.39). Liver Function Tests: No results for input(s): AST, ALT, ALKPHOS, BILITOT, PROT, ALBUMIN in the last 168 hours. No results for  input(s): LIPASE, AMYLASE in the last 168 hours. No results for input(s): AMMONIA in the last 168 hours. Coagulation profile No results for input(s): INR, PROTIME in the last 168 hours.  CBC:  Recent Labs Lab 10/15/15 0802 10/16/15 [id number] 10/17/15 0530 10/18/15 0811 10/19/15 0607  WBC 5.4 6.2 6.2 7.1 6.7  HGB 8.3* 8.0* 7.6* 7.6* 7.8*  HCT 27.2* 25.8* 24.6* 25.5* 24.8*  MCV 61.0* 61.7* 62.3* 62.0* 61.7*  PLT 132* 133* 134* 130* 148*   Cardiac Enzymes: No results for input(s): CKTOTAL, CKMB, CKMBINDEX, TROPONINI in the last 168 hours. BNP (last 3 results) No results for input(s): PROBNP in the last 8760 hours. CBG: No results for input(s): GLUCAP in the last 168 hours. D-Dimer: No results for input(s): DDIMER in the last 72 hours. Hgb A1c: No results for input(s): HGBA1C in the last 72 hours. Lipid Profile: No results for input(s): CHOL, HDL, LDLCALC, TRIG, CHOLHDL, LDLDIRECT in the last 72 hours. Thyroid function studies: No results for input(s): TSH, T4TOTAL, T3FREE, THYROIDAB in the last 72 hours.  Invalid input(s): FREET3 Anemia work up:  Recent Labs  10/17/15 1159  VITAMINB12 624  FOLATE 19.8  FERRITIN 16*  TIBC 318  IRON 45  RETICCTPCT 1.8   Sepsis Labs:  Recent Labs Lab 10/16/15 0632 10/17/15 0530 10/18/15 0811 10/19/15 0607  WBC 6.2 6.2 7.1 6.7   Microbiology Recent Results (from the past 240 hour(s))  Gastrointestinal Panel by PCR , Stool     Status: None   Collection Time: 10/16/15  5:12 PM  Result Value Ref Range Status   Campylobacter species NOT DETECTED NOT DETECTED Final   Plesimonas shigelloides NOT DETECTED NOT DETECTED Final   Salmonella species NOT DETECTED NOT DETECTED Final   Yersinia enterocolitica NOT DETECTED NOT DETECTED Final   Vibrio species NOT DETECTED NOT DETECTED Final   Vibrio cholerae NOT DETECTED NOT DETECTED Final   Enteroaggregative E coli (EAEC) NOT DETECTED NOT DETECTED Final   Enteropathogenic E coli (EPEC) NOT  DETECTED NOT DETECTED Final   Enterotoxigenic E coli (ETEC) NOT DETECTED NOT DETECTED Final   Shiga like toxin producing E coli (STEC) NOT DETECTED NOT DETECTED Final   E. coli O157 NOT DETECTED NOT DETECTED Final   Shigella/Enteroinvasive E coli (EIEC) NOT DETECTED NOT DETECTED Final   Cryptosporidium NOT DETECTED NOT DETECTED Final   Cyclospora cayetanensis NOT DETECTED NOT DETECTED Final   Entamoeba histolytica NOT DETECTED NOT DETECTED Final   Giardia lamblia NOT DETECTED NOT DETECTED Final   Adenovirus F40/41 NOT DETECTED NOT DETECTED Final   Astrovirus NOT DETECTED NOT DETECTED Final   Norovirus GI/GII NOT DETECTED NOT DETECTED Final   Rotavirus A NOT DETECTED NOT DETECTED Final   Sapovirus (I, II, IV, and V) NOT DETECTED NOT DETECTED Final  C difficile quick scan w PCR reflex     Status: None   Collection Time: 10/16/15  5:12 PM  Result Value Ref Range Status   C Diff antigen NEGATIVE NEGATIVE Final   C Diff toxin NEGATIVE NEGATIVE Final   C Diff interpretation Negative for toxigenic C. difficile  Final     Medications:   . carvedilol  12.5 mg Oral BID WC  . docusate sodium  100 mg Oral BID  . enoxaparin (LOVENOX) injection  40 mg Subcutaneous Q24H  . ferrous sulfate  325 mg Oral BID WC  . hydrocortisone cream   Topical TID  . losartan  25 mg Oral Daily  . mometasone-formoterol  2 puff Inhalation BID  . pregabalin  75 mg Oral BID  . tiotropium  18 mcg Inhalation QAC breakfast   Continuous Infusions:   Time spent: 35 minutes with > 50% of time discussing current diagnostic test results, clinical impression and plan of care. He needed much reassurance to alleviate his concerns.   LOS: 4 days   [facility] Pager [phone]. If unable to reach me by pager, please call my cell phone at [phone].  *Please refer to [email or web address], password [email or web address] to get updated schedule on who will round on this patient, as hospitalists switch teams weekly. If 7PM-7AM,  please contact night-coverage at [email or web address], password [email or web address] for any overnight needs.  10/19/2015, 9:59 AM

## 2015-10-20 ENCOUNTER — Inpatient Hospital Stay (HOSPITAL_COMMUNITY): Payer: Medicare Other

## 2015-10-20 DIAGNOSIS — J439 Emphysema, unspecified: Secondary | ICD-10-CM

## 2015-10-20 MED ORDER — METOCLOPRAMIDE HCL 5 MG/ML IJ SOLN
5.0000 mg | Freq: Three times a day (TID) | INTRAMUSCULAR | Status: DC
  Administered 2015-10-20 – 2015-10-21 (×2): 5 mg via INTRAVENOUS
  Filled 2015-10-20 (×2): qty 2

## 2015-10-20 MED ORDER — DIPHENHYDRAMINE HCL 25 MG PO CAPS
25.0000 mg | ORAL_CAPSULE | ORAL | Status: DC | PRN
  Administered 2015-10-20 – 2015-10-22 (×3): 25 mg via ORAL
  Filled 2015-10-20 (×3): qty 1

## 2015-10-20 MED ORDER — POLYETHYLENE GLYCOL 3350 17 G PO PACK
17.0000 g | PACK | Freq: Every day | ORAL | Status: DC
  Administered 2015-10-20 – 2015-10-21 (×2): 17 g via ORAL
  Filled 2015-10-20 (×2): qty 1

## 2015-10-20 NOTE — Progress Notes (Signed)
PROGRESS NOTE  [name] [id number] DOB: Nov 29, 1923 DOA: 10/14/2015 PCP:  Melinda Crutch, MD  HPI/Recap of past 56 hours: 82 year old male with past history of CAD and COPD admitted on 3/2 with several episodes of nausea and vomiting with CT scans felt to be small bowel obstruction versus ileus/postinfectious. Over past few days, patient has had improvement in symptoms.  Now tolerating solid food. Has not had solid bowel movement in several days. Had diarrhea for several days prior to admission. Follow-up abdominal x-ray done 3/8 notes improving small bowel obstruction with some contrast residual plus stool in colon. Patient self complains of distended abdomen.   Assessment/Plan: Principal Problem:   Ileus ([place]): Low dose IV Reglan. Will give laxative for stool. Active Problems:   HYPERTENSION, BENIGN: Blood pressure stable   CAD, NATIVE VESSEL   COPD GOLD III: Breathing stable   Abnormality of gait   Acute kidney injury ([place]) in the setting of stage III chronic kidney disease: Resolved with IV fluids, back to baseline. Secondary to dehydration from nausea and vomiting.   Nausea/vomiting/diarrhea: Transient enterovirus, now resolved    Anemia, iron deficiency: Chronic, stable     Code Status: Full code   Family Communication:    Disposition Plan: Anticipate discharge in next 24 hours    Consultants:  Case discussed with surgery   Procedures:  None   Antibiotics:  None    Objective: BP 136/56 mmHg  Pulse 82  Temp(Src) 98.5 F (36.9 C) (Oral)  Resp 18  Ht 6' (1.829 m)  Wt 78.699 kg (173 lb 8 oz)  BMI 23.53 kg/m2  SpO2 97%  Intake/Output Summary (Last 24 hours) at 10/20/15 1421 Last data filed at 10/20/15 0631  Gross per 24 hour  Intake    480 ml  Output      0 ml  Net    480 ml   Filed Weights   10/14/15 1748 10/15/15 0203  Weight: 77.111 kg (170 lb) 78.699 kg (173 lb 8 oz)    Exam:   General:  Alert and oriented 2, heart of hearing, no acute  distress   Cardiovascular: Regular rate and rhythm, Q000111Q, 2/6 systolic ejection murmur   Respiratory: Clear to auscultation bilaterally   Abdomen: Soft, few bowel sounds, distended, nontender   Musculoskeletal: No clubbing or cyanosis or edema    Data Reviewed: Basic Metabolic Panel:  Recent Labs Lab 10/16/15 0632 10/17/15 0530 10/17/15 2340 10/18/15 0811 10/19/15 0607  NA 139 142 142 144 140  K 4.1 4.3 4.2 4.2 4.0  CL 111 114* 111 115* 112*  CO2 19* 21* 22 20* 20*  GLUCOSE 98 94 150* 143* 111*  BUN 28* 21* 18 16 17   CREATININE 1.30* 1.28* 1.28* 1.28* 1.39*  CALCIUM 8.6* 8.4* 8.7* 8.5* 8.4*   Liver Function Tests: No results for input(s): AST, ALT, ALKPHOS, BILITOT, PROT, ALBUMIN in the last 168 hours. No results for input(s): LIPASE, AMYLASE in the last 168 hours. No results for input(s): AMMONIA in the last 168 hours. CBC:  Recent Labs Lab 10/15/15 0802 10/16/15 [id number] 10/17/15 0530 10/18/15 0811 10/19/15 0607  WBC 5.4 6.2 6.2 7.1 6.7  HGB 8.3* 8.0* 7.6* 7.6* 7.8*  HCT 27.2* 25.8* 24.6* 25.5* 24.8*  MCV 61.0* 61.7* 62.3* 62.0* 61.7*  PLT 132* 133* 134* 130* 148*   Cardiac Enzymes:   No results for input(s): CKTOTAL, CKMB, CKMBINDEX, TROPONINI in the last 168 hours. BNP (last 3 results)  Recent Labs  01/08/15 0729 01/08/15 1000  09/20/15 1833  BNP 569.6* 439.7* 196.1*    ProBNP (last 3 results) No results for input(s): PROBNP in the last 8760 hours.  CBG: No results for input(s): GLUCAP in the last 168 hours.  Recent Results (from the past 240 hour(s))  Gastrointestinal Panel by PCR , Stool     Status: None   Collection Time: 10/16/15  5:12 PM  Result Value Ref Range Status   Campylobacter species NOT DETECTED NOT DETECTED Final   Plesimonas shigelloides NOT DETECTED NOT DETECTED Final   Salmonella species NOT DETECTED NOT DETECTED Final   Yersinia enterocolitica NOT DETECTED NOT DETECTED Final   Vibrio species NOT DETECTED NOT DETECTED Final     Vibrio cholerae NOT DETECTED NOT DETECTED Final   Enteroaggregative E coli (EAEC) NOT DETECTED NOT DETECTED Final   Enteropathogenic E coli (EPEC) NOT DETECTED NOT DETECTED Final   Enterotoxigenic E coli (ETEC) NOT DETECTED NOT DETECTED Final   Shiga like toxin producing E coli (STEC) NOT DETECTED NOT DETECTED Final   E. coli O157 NOT DETECTED NOT DETECTED Final   Shigella/Enteroinvasive E coli (EIEC) NOT DETECTED NOT DETECTED Final   Cryptosporidium NOT DETECTED NOT DETECTED Final   Cyclospora cayetanensis NOT DETECTED NOT DETECTED Final   Entamoeba histolytica NOT DETECTED NOT DETECTED Final   Giardia lamblia NOT DETECTED NOT DETECTED Final   Adenovirus F40/41 NOT DETECTED NOT DETECTED Final   Astrovirus NOT DETECTED NOT DETECTED Final   Norovirus GI/GII NOT DETECTED NOT DETECTED Final   Rotavirus A NOT DETECTED NOT DETECTED Final   Sapovirus (I, II, IV, and V) NOT DETECTED NOT DETECTED Final  C difficile quick scan w PCR reflex     Status: None   Collection Time: 10/16/15  5:12 PM  Result Value Ref Range Status   C Diff antigen NEGATIVE NEGATIVE Final   C Diff toxin NEGATIVE NEGATIVE Final   C Diff interpretation Negative for toxigenic C. difficile  Final     Studies: Dg Abd Portable 1v  10/20/2015  CLINICAL DATA:  Followup small bowel obstruction. EXAM: PORTABLE ABDOMEN - 1 VIEW COMPARISON:  10/17/2015.  CT, 10/15/2015. FINDINGS: Mild dilation of small bowel is seen centrally. There is air in stool in the colon as well as some contrast in the left colon. The degree of small bowel dilation is mildly decreased from the prior exam. No new abnormalities. IMPRESSION: 1. Mild improvement in the partial small bowel obstruction with less small bowel dilation noted on the current study. Electronically Signed   By: Lajean Manes M.D.   On: 10/20/2015 11:56    Scheduled Meds: . carvedilol  12.5 mg Oral BID WC  . enoxaparin (LOVENOX) injection  40 mg Subcutaneous Q24H  . ferrous sulfate   325 mg Oral BID WC  . hydrocortisone cream   Topical TID  . losartan  25 mg Oral Daily  . metoCLOPramide (REGLAN) injection  5 mg Intravenous 3 times per day  . mometasone-formoterol  2 puff Inhalation BID  . pregabalin  75 mg Oral BID  . tiotropium  18 mcg Inhalation QAC breakfast    Continuous Infusions:    Time spent: 15 minutes   [facility] Pager [phone] . If 7PM-7AM, please contact night-coverage at [email or web address], password [facility] 10/20/2015, 2:21 PM  LOS: 5 days

## 2015-10-20 NOTE — Progress Notes (Signed)
Physical Therapy Treatment Patient Details Name: [name] MRN: [id number] DOB: 03/24/1924 Today's Date: 10/20/2015    History of Present Illness Patient is a 82 year old male with COPD, CAD, GERD, presented with several episodes of nausea and vomiting. Admitted for ileus, and acute on chronic kidney injury.    PT Comments    Patient was very eager to get out of bed and walk around however became fatigued quickly and requested to return to his room. Unable to attempt steps this session. Continue to recommend HHPT with supervision.   Follow Up Recommendations  Home health PT;Supervision - Intermittent     Equipment Recommendations  None recommended by PT    Recommendations for Other Services       Precautions / Restrictions Precautions Precautions: Fall Precaution Comments: bil LE AFO shoes, incontinent B&B    Mobility  Bed Mobility Overal bed mobility: Independent                Transfers Overall transfer level: Needs assistance Equipment used: Rolling walker (2 wheeled)   Sit to Stand: Supervision         General transfer comment: Cues for safety with RW  Ambulation/Gait Ambulation/Gait assistance: Supervision Ambulation Distance (Feet): 200 Feet Assistive device: Rolling walker (2 wheeled) Gait Pattern/deviations: Step-through pattern;Decreased stride length;Trunk flexed Gait velocity: decreased   General Gait Details: Cues for safety with walker. Attempted to make it to steps however patient fatigued very quickly and requested to go back to room.   Stairs            Wheelchair Mobility    Modified Rankin (Stroke Patients Only)       Balance                                    Cognition Arousal/Alertness: Awake/alert Behavior During Therapy: WFL for tasks assessed/performed Overall Cognitive Status: Within Functional Limits for tasks assessed           Safety/Judgement: Decreased awareness of deficits           Exercises      General Comments        Pertinent Vitals/Pain Pain Assessment: No/denies pain    Home Living                      Prior Function            PT Goals (current goals can now be found in the care plan section) Progress towards PT goals: Progressing toward goals    Frequency  Min 3X/week    PT Plan Current plan remains appropriate    Co-evaluation             End of Session Equipment Utilized During Treatment: Gait belt Activity Tolerance: Patient tolerated treatment well Patient left: in chair;with call bell/phone within reach     Time: 1255-1312 PT Time Calculation (min) (ACUTE ONLY): 17 min  Charges:  $Gait Training: 8-22 mins                    G Codes:      Jacqualyn Posey 10/20/2015, 1:32 PM  10/20/2015 Kiyona Mcnall, Tonia Brooms PTA

## 2015-10-21 MED ORDER — LACTULOSE 10 GM/15ML PO SOLN
10.0000 g | Freq: Every day | ORAL | Status: DC
  Administered 2015-10-22 – 2015-10-23 (×2): 10 g via ORAL
  Filled 2015-10-21 (×2): qty 15

## 2015-10-21 MED ORDER — LINACLOTIDE 145 MCG PO CAPS: 145.0000 ug | ORAL_CAPSULE | Freq: Every day | ORAL | Status: DC

## 2015-10-21 MED ORDER — BISACODYL 10 MG RE SUPP
10.0000 mg | Freq: Once | RECTAL | Status: AC
  Administered 2015-10-21: 10 mg via RECTAL
  Filled 2015-10-21: qty 1

## 2015-10-21 MED ORDER — LINACLOTIDE 145 MCG PO CAPS
145.0000 ug | ORAL_CAPSULE | Freq: Every day | ORAL | Status: DC
  Administered 2015-10-22 – 2015-10-23 (×2): 145 ug via ORAL
  Filled 2015-10-21 (×3): qty 1

## 2015-10-21 NOTE — Progress Notes (Signed)
PROGRESS NOTE  [name] [id number] DOB: [birth year] DOA: 10/14/2015 PCP:  Melinda Crutch, MD  HPI/Recap of past 24 hours: 82 year old male with past history of CAD and COPD admitted on 3/2 with several episodes of nausea and vomiting with CT scans felt to be small bowel obstruction versus ileus/postinfectious. Over past few days, patient has had improvement in symptoms.  Now tolerating solid food. Has not had solid bowel movement in several days. Had diarrhea for several days prior to admission. Follow-up abdominal x-ray done 3/8 notes improving small bowel obstruction with some contrast residual plus stool in colon. Patient self complains of distended abdomen.  Patient is been able to tolerate by mouth, but is now not had about movement despite solid food for several days. No results with MiraLAX and unable to hold him suppository. No complaints of some mild abdominal discomfort   Assessment/Plan: Principal Problem:   Ileus (HCC)/resolving small bowel obstruction: Low dose IV Reglan. We'll try Linzess Active Problems:   HYPERTENSION, BENIGN: Blood pressure stable   CAD, NATIVE VESSEL   COPD GOLD III: Breathing stable   Abnormality of gait   Acute kidney injury ([place]) in the setting of stage III chronic kidney disease: Resolved with IV fluids, back to baseline. Secondary to dehydration from nausea and vomiting.   Nausea/vomiting/diarrhea: Transient enterovirus, now resolved    Anemia, iron deficiency: Chronic, stable     Code Status: Full code   Family Communication:  Left message with family  Disposition Plan: Anticipate discharge once once his bowels move, hopefully in the next 24 hours   Consultants:  Case discussed with surgery   Procedures:  None   Antibiotics:  None    Objective: BP 120/55 mmHg  Pulse 75  Temp(Src) 97.3 F (36.3 C) (Oral)  Resp 18  Ht 6' (1.829 m)  Wt 78.699 kg (173 lb 8 oz)  BMI 23.53 kg/m2  SpO2 97%  Intake/Output Summary (Last 24  hours) at 10/21/15 1637 Last data filed at 10/21/15 0128  Gross per 24 hour  Intake    240 ml  Output    325 ml  Net    -85 ml   Filed Weights   10/14/15 1748 10/15/15 0203  Weight: 77.111 kg (170 lb) 78.699 kg (173 lb 8 oz)    Exam: Unchanged from previous day  General:  Alert and oriented 2, heart of hearing, no acute distress   Cardiovascular: Regular rate and rhythm, Q000111Q, 2/6 systolic ejection murmur   Respiratory: Clear to auscultation bilaterally   Abdomen: Soft, few bowel sounds, distended, nontender   Musculoskeletal: No clubbing or cyanosis or edema    Data Reviewed: Basic Metabolic Panel:  Recent Labs Lab 10/16/15 0632 10/17/15 0530 10/17/15 2340 10/18/15 0811 10/19/15 0607  NA 139 142 142 144 140  K 4.1 4.3 4.2 4.2 4.0  CL 111 114* 111 115* 112*  CO2 19* 21* 22 20* 20*  GLUCOSE 98 94 150* 143* 111*  BUN 28* 21* 18 16 17   CREATININE 1.30* 1.28* 1.28* 1.28* 1.39*  CALCIUM 8.6* 8.4* 8.7* 8.5* 8.4*   Liver Function Tests: No results for input(s): AST, ALT, ALKPHOS, BILITOT, PROT, ALBUMIN in the last 168 hours. No results for input(s): LIPASE, AMYLASE in the last 168 hours. No results for input(s): AMMONIA in the last 168 hours. CBC:  Recent Labs Lab 10/15/15 0802 10/16/15 [id number] 10/17/15 0530 10/18/15 0811 10/19/15 0607  WBC 5.4 6.2 6.2 7.1 6.7  HGB 8.3* 8.0* 7.6* 7.6* 7.8*  HCT  27.2* 25.8* 24.6* 25.5* 24.8*  MCV 61.0* 61.7* 62.3* 62.0* 61.7*  PLT 132* 133* 134* 130* 148*   Cardiac Enzymes:   No results for input(s): CKTOTAL, CKMB, CKMBINDEX, TROPONINI in the last 168 hours. BNP (last 3 results)  Recent Labs  01/08/15 0729 01/08/15 1000 09/20/15 1833  BNP 569.6* 439.7* 196.1*    ProBNP (last 3 results) No results for input(s): PROBNP in the last 8760 hours.  CBG: No results for input(s): GLUCAP in the last 168 hours.  Recent Results (from the past 240 hour(s))  Gastrointestinal Panel by PCR , Stool     Status: None    Collection Time: 10/16/15  5:12 PM  Result Value Ref Range Status   Campylobacter species NOT DETECTED NOT DETECTED Final   Plesimonas shigelloides NOT DETECTED NOT DETECTED Final   Salmonella species NOT DETECTED NOT DETECTED Final   Yersinia enterocolitica NOT DETECTED NOT DETECTED Final   Vibrio species NOT DETECTED NOT DETECTED Final   Vibrio cholerae NOT DETECTED NOT DETECTED Final   Enteroaggregative E coli (EAEC) NOT DETECTED NOT DETECTED Final   Enteropathogenic E coli (EPEC) NOT DETECTED NOT DETECTED Final   Enterotoxigenic E coli (ETEC) NOT DETECTED NOT DETECTED Final   Shiga like toxin producing E coli (STEC) NOT DETECTED NOT DETECTED Final   E. coli O157 NOT DETECTED NOT DETECTED Final   Shigella/Enteroinvasive E coli (EIEC) NOT DETECTED NOT DETECTED Final   Cryptosporidium NOT DETECTED NOT DETECTED Final   Cyclospora cayetanensis NOT DETECTED NOT DETECTED Final   Entamoeba histolytica NOT DETECTED NOT DETECTED Final   Giardia lamblia NOT DETECTED NOT DETECTED Final   Adenovirus F40/41 NOT DETECTED NOT DETECTED Final   Astrovirus NOT DETECTED NOT DETECTED Final   Norovirus GI/GII NOT DETECTED NOT DETECTED Final   Rotavirus A NOT DETECTED NOT DETECTED Final   Sapovirus (I, II, IV, and V) NOT DETECTED NOT DETECTED Final  C difficile quick scan w PCR reflex     Status: None   Collection Time: 10/16/15  5:12 PM  Result Value Ref Range Status   C Diff antigen NEGATIVE NEGATIVE Final   C Diff toxin NEGATIVE NEGATIVE Final   C Diff interpretation Negative for toxigenic C. difficile  Final     Studies: No results found.  Scheduled Meds: . carvedilol  12.5 mg Oral BID WC  . enoxaparin (LOVENOX) injection  40 mg Subcutaneous Q24H  . ferrous sulfate  325 mg Oral BID WC  . hydrocortisone cream   Topical TID  . Linaclotide  145 mcg Oral Q breakfast  . losartan  25 mg Oral Daily  . metoCLOPramide (REGLAN) injection  5 mg Intravenous 3 times per day  . mometasone-formoterol  2  puff Inhalation BID  . polyethylene glycol  17 g Oral Daily  . pregabalin  75 mg Oral BID  . tiotropium  18 mcg Inhalation QAC breakfast    Continuous Infusions:    Time spent: 15 minutes   [facility] Pager [phone] . If 7PM-7AM, please contact night-coverage at [email or web address], password [facility] 10/21/2015, 4:37 PM  LOS: 6 days

## 2015-10-21 NOTE — Care Management Note (Signed)
Case Management Note  Patient Details  Name: [name] MRN: [id number] Date of Birth: April 10, 1924  Subjective/Objective:                    Action/Plan:  UR updated  Expected Discharge Date:                  Expected Discharge Plan:  [place]  In-House Referral:     Discharge planning Services  CM Consult  Post Acute Care Choice:  Home Health Choice offered to:  Patient  DME Arranged:    DME Agency:     HH Arranged:  PT, RN, OT HH Agency:  [place]  Status of Service:  Completed, signed off  Medicare Important Message Given:  Yes Date Medicare IM Given:    Medicare IM give by:    Date Additional Medicare IM Given:    Additional Medicare Important Message give by:     If discussed at [place] of Stay Meetings, dates discussed:    Additional Comments:  Marilu Favre, RN 10/21/2015, 10:40 AM

## 2015-10-22 ENCOUNTER — Encounter: Payer: Self-pay | Admitting: Family

## 2015-10-22 LAB — CBC
HEMATOCRIT: 25.1 % — AB (ref 39.0–52.0)
Hemoglobin: 7.5 g/dL — ABNORMAL LOW (ref 13.0–17.0)
MCH: 18.5 pg — ABNORMAL LOW (ref 26.0–34.0)
MCHC: 29.9 g/dL — ABNORMAL LOW (ref 30.0–36.0)
MCV: 61.8 fL — AB (ref 78.0–100.0)
PLATELETS: 153 10*3/uL (ref 150–400)
RBC: 4.06 MIL/uL — AB (ref 4.22–5.81)
RDW: 17.2 % — ABNORMAL HIGH (ref 11.5–15.5)
WBC: 5.4 10*3/uL (ref 4.0–10.5)

## 2015-10-22 LAB — BASIC METABOLIC PANEL
Anion gap: 9 (ref 5–15)
BUN: 14 mg/dL (ref 6–20)
CHLORIDE: 109 mmol/L (ref 101–111)
CO2: 22 mmol/L (ref 22–32)
Calcium: 8.5 mg/dL — ABNORMAL LOW (ref 8.9–10.3)
Creatinine, Ser: 1.18 mg/dL (ref 0.61–1.24)
GFR, EST NON AFRICAN AMERICAN: 53 mL/min — AB (ref >60)
Glucose, Bld: 106 mg/dL — ABNORMAL HIGH (ref 65–99)
POTASSIUM: 4.5 mmol/L (ref 3.5–5.1)
SODIUM: 140 mmol/L (ref 135–145)

## 2015-10-22 NOTE — Progress Notes (Signed)
PROGRESS NOTE  [name] [id number] DOB: 1924/03/12 DOA: 10/14/2015 PCP:  Melinda Crutch, MD  HPI/Recap of past 22 hours: 82 year old male with past history of CAD and COPD admitted on 3/2 with several episodes of nausea and vomiting with CT scans felt to be small bowel obstruction versus ileus/postinfectious. Over past few days, patient has had improvement in symptoms.  Now tolerating solid food. Has not had solid bowel movement in several days. Had diarrhea for several days prior to admission. Follow-up abdominal x-ray done 3/8 notes improving small bowel obstruction with some contrast residual plus stool in colon. Patient self complains of distended abdomen.  Patient is been able to tolerate by mouth, but is now not had about movement despite solid food for several days. No results with MiraLAX, lactulose, or Linzess and Reglan.  Patient was abdominal distention, but no abdominal pain   Assessment/Plan: Principal Problem:   Ileus (HCC)/resolving small bowel obstruction: We will try [place] enema Active Problems:   HYPERTENSION, BENIGN: Blood pressure stable   CAD, NATIVE VESSEL   COPD GOLD III: Breathing stable   Abnormality of gait   Acute kidney injury ([place]) in the setting of stage III chronic kidney disease: Resolved with IV fluids, back to baseline. Secondary to dehydration from nausea and vomiting.   Nausea/vomiting/diarrhea: Transient enterovirus, now resolved    Anemia, iron deficiency: Chronic, stable     Code Status: Full code   Family Communication:  Left message with family  Disposition Plan: Anticipate discharge once once his bowels move, hopefully in the next 24 hours   Consultants:  Case discussed with surgery   Procedures:  None   Antibiotics:  None    Objective: BP 118/54 mmHg  Pulse 77  Temp(Src) 98 F (36.7 C) (Oral)  Resp 18  Ht 6' (1.829 m)  Wt 78.699 kg (173 lb 8 oz)  BMI 23.53 kg/m2  SpO2 99%  Intake/Output Summary (Last 24 hours) at  10/22/15 1729 Last data filed at 10/22/15 1523  Gross per 24 hour  Intake    960 ml  Output      0 ml  Net    960 ml   Filed Weights   10/14/15 1748 10/15/15 0203  Weight: 77.111 kg (170 lb) 78.699 kg (173 lb 8 oz)    Exam:   General:  Alert and oriented 2, heart of hearing, no acute distress   Cardiovascular: Regular rate and rhythm, Q000111Q, 2/6 systolic ejection murmur   Respiratory: Clear to auscultation bilaterally   Abdomen: Soft, Increased bowel sounds, distended, nontender   Musculoskeletal: No clubbing or cyanosis or edema    Data Reviewed: Basic Metabolic Panel:  Recent Labs Lab 10/17/15 0530 10/17/15 2340 10/18/15 0811 10/19/15 0607 10/22/15 0539  NA 142 142 144 140 140  K 4.3 4.2 4.2 4.0 4.5  CL 114* 111 115* 112* 109  CO2 21* 22 20* 20* 22  GLUCOSE 94 150* 143* 111* 106*  BUN 21* 18 16 17 14   CREATININE 1.28* 1.28* 1.28* 1.39* 1.18  CALCIUM 8.4* 8.7* 8.5* 8.4* 8.5*   Liver Function Tests: No results for input(s): AST, ALT, ALKPHOS, BILITOT, PROT, ALBUMIN in the last 168 hours. No results for input(s): LIPASE, AMYLASE in the last 168 hours. No results for input(s): AMMONIA in the last 168 hours. CBC:  Recent Labs Lab 10/16/15 0632 10/17/15 0530 10/18/15 0811 10/19/15 0607 10/22/15 0539  WBC 6.2 6.2 7.1 6.7 5.4  HGB 8.0* 7.6* 7.6* 7.8* 7.5*  HCT 25.8* 24.6*  25.5* 24.8* 25.1*  MCV 61.7* 62.3* 62.0* 61.7* 61.8*  PLT 133* 134* 130* 148* 153   Cardiac Enzymes:   No results for input(s): CKTOTAL, CKMB, CKMBINDEX, TROPONINI in the last 168 hours. BNP (last 3 results)  Recent Labs  01/08/15 0729 01/08/15 1000 09/20/15 1833  BNP 569.6* 439.7* 196.1*    ProBNP (last 3 results) No results for input(s): PROBNP in the last 8760 hours.  CBG: No results for input(s): GLUCAP in the last 168 hours.  Recent Results (from the past 240 hour(s))  Gastrointestinal Panel by PCR , Stool     Status: None   Collection Time: 10/16/15  5:12 PM    Result Value Ref Range Status   Campylobacter species NOT DETECTED NOT DETECTED Final   Plesimonas shigelloides NOT DETECTED NOT DETECTED Final   Salmonella species NOT DETECTED NOT DETECTED Final   Yersinia enterocolitica NOT DETECTED NOT DETECTED Final   Vibrio species NOT DETECTED NOT DETECTED Final   Vibrio cholerae NOT DETECTED NOT DETECTED Final   Enteroaggregative E coli (EAEC) NOT DETECTED NOT DETECTED Final   Enteropathogenic E coli (EPEC) NOT DETECTED NOT DETECTED Final   Enterotoxigenic E coli (ETEC) NOT DETECTED NOT DETECTED Final   Shiga like toxin producing E coli (STEC) NOT DETECTED NOT DETECTED Final   E. coli O157 NOT DETECTED NOT DETECTED Final   Shigella/Enteroinvasive E coli (EIEC) NOT DETECTED NOT DETECTED Final   Cryptosporidium NOT DETECTED NOT DETECTED Final   Cyclospora cayetanensis NOT DETECTED NOT DETECTED Final   Entamoeba histolytica NOT DETECTED NOT DETECTED Final   Giardia lamblia NOT DETECTED NOT DETECTED Final   Adenovirus F40/41 NOT DETECTED NOT DETECTED Final   Astrovirus NOT DETECTED NOT DETECTED Final   Norovirus GI/GII NOT DETECTED NOT DETECTED Final   Rotavirus A NOT DETECTED NOT DETECTED Final   Sapovirus (I, II, IV, and V) NOT DETECTED NOT DETECTED Final  C difficile quick scan w PCR reflex     Status: None   Collection Time: 10/16/15  5:12 PM  Result Value Ref Range Status   C Diff antigen NEGATIVE NEGATIVE Final   C Diff toxin NEGATIVE NEGATIVE Final   C Diff interpretation Negative for toxigenic C. difficile  Final     Studies: No results found.  Scheduled Meds: . carvedilol  12.5 mg Oral BID WC  . enoxaparin (LOVENOX) injection  40 mg Subcutaneous Q24H  . ferrous sulfate  325 mg Oral BID WC  . hydrocortisone cream   Topical TID  . lactulose  10 g Oral Daily  . Linaclotide  145 mcg Oral Q breakfast  . losartan  25 mg Oral Daily  . metoCLOPramide (REGLAN) injection  5 mg Intravenous 3 times per day  . mometasone-formoterol  2 puff  Inhalation BID  . pregabalin  75 mg Oral BID  . tiotropium  18 mcg Inhalation QAC breakfast    Continuous Infusions:    Time spent: 15 minutes   [facility] Pager [phone] . If 7PM-7AM, please contact night-coverage at [email or web address], password [facility] 10/22/2015, 5:29 PM  LOS: 7 days

## 2015-10-22 NOTE — Care Management Important Message (Signed)
Important Message  Patient Details  Name: [name] MRN: [id number] Date of Birth: 1923-10-19   Medicare Important Message Given:  Yes    Brystal Kildow P Randi Poullard 10/22/2015, 1:10 PM

## 2015-10-22 NOTE — Progress Notes (Signed)
Physical Therapy Treatment Patient Details Name: [name] MRN: [id number] DOB: 1923/12/31 Today's Date: 10/22/2015    History of Present Illness Patient is a 82 year old male with COPD, CAD, GERD, presented with several episodes of nausea and vomiting. Admitted for ileus, and acute on chronic kidney injury.    PT Comments    Pt performed increased gait training but remains fatigued post activity.  Will require stairs before d/c to ensure safe entry into home.    Follow Up Recommendations  Home health PT;Supervision - Intermittent     Equipment Recommendations  None recommended by PT    Recommendations for Other Services       Precautions / Restrictions Precautions Precautions: Fall Precaution Comments: bil LE AFO shoes, incontinent B&B Restrictions Weight Bearing Restrictions: No    Mobility  Bed Mobility Overal bed mobility:  (Pt received in recliner chair on arrival.  )                Transfers Overall transfer level: Needs assistance Equipment used: Rolling walker (2 wheeled) Transfers: Sit to/from Stand Sit to Stand: Supervision         General transfer comment: Cues for safety.  B AFO donned for tx.    Ambulation/Gait Ambulation/Gait assistance: Min guard Ambulation Distance (Feet): 250 Feet Assistive device: Rolling walker (2 wheeled) Gait Pattern/deviations: Step-through pattern;Trunk flexed;Decreased stride length Gait velocity: decreased   General Gait Details: Cues for safety with walker. Pt performed with increased fatigue.  Will required chair transport to attempt stairs next visit.     Stairs            Wheelchair Mobility    Modified Rankin (Stroke Patients Only)       Balance                                    Cognition Arousal/Alertness: Awake/alert Behavior During Therapy: WFL for tasks assessed/performed Overall Cognitive Status: Within Functional Limits for tasks assessed Area of Impairment:  Safety/judgement                    Exercises Other Exercises Other Exercises: Pt performed standing exercises to improve strength and promote functional independence.  Pt performed chair push-ups, mini squats, hip abd/add and marching: 1x10 reps.      General Comments        Pertinent Vitals/Pain Pain Assessment: No/denies pain    Home Living                      Prior Function            PT Goals (current goals can now be found in the care plan section) Acute Rehab PT Goals Patient Stated Goal: Get better Potential to Achieve Goals: Good Progress towards PT goals: Progressing toward goals    Frequency  Min 3X/week    PT Plan Current plan remains appropriate    Co-evaluation             End of Session Equipment Utilized During Treatment: Gait belt Activity Tolerance: Patient tolerated treatment well Patient left: in chair;with call bell/phone within reach     Time: [id number] PT Time Calculation (min) (ACUTE ONLY): 20 min  Charges:  $Gait Training: 8-22 mins                    G Codes:      Calyn Sivils J  Channing Yeager 10/22/2015, 3:14 PM  Governor Rooks, PTA pager [phone]

## 2015-10-23 MED ORDER — FUROSEMIDE 20 MG PO TABS: ORAL_TABLET | ORAL | Status: DC

## 2015-10-23 NOTE — Progress Notes (Signed)
Pt prepared for discharge to home. Calling family member to come and pick him up. Pt. Is alert and oriented. Pt is hemodynamically stable. AVS reviewed with pt. Capable of re verbalizing medication regimen. No IV in place. Discharge plan appropriate and in place.

## 2015-10-23 NOTE — Discharge Summary (Signed)
Discharge Summary  [name] [id number] DOB: Feb 27, 1924  PCP:  Melinda Crutch, MD  Admit date: 10/14/2015 Discharge date: 10/23/2015  Time spent: 25 minutes   Recommendations for Outpatient Follow-up:  1. Patient follow-up with his PCP as needed   Discharge Diagnoses:  Active Hospital Problems   Diagnosis Date Noted  . Ileus ([place]) 10/19/2015  . Stage III chronic kidney disease 10/19/2015  . Anemia, iron deficiency 10/19/2015  . COPD (chronic obstructive pulmonary disease) ([place]) 10/19/2015  . Essential hypertension 10/19/2015  . Diarrhea 10/15/2015  . Acute kidney injury ([place]) 09/20/2015  . Abnormality of gait 03/01/2015  . COPD GOLD III 10/30/2013  . HYPERTENSION, BENIGN 04/21/2009  . CAD, NATIVE VESSEL 11/06/2008    Resolved Hospital Problems   Diagnosis Date Noted Date Resolved  No resolved problems to display.    Discharge Condition: Improved, being discharged home   Diet recommendation: Heart healthy   Filed Weights   10/14/15 1748 10/15/15 0203  Weight: 77.111 kg (170 lb) 78.699 kg (173 lb 8 oz)    History of present illness:  82 year old male with past history of CAD and COPD admitted on 3/2 with several episodes of nausea and vomiting with CT scans felt to be small bowel obstruction versus ileus/postinfectious.   Hospital Course:  Principal Problem:   Ileus (HCC)/resolving small bowel obstruction: Over the next few days, patient had improvement in symptoms. By 3/7, tolerating solid food. This point, he had not had a solid bowel movement several days. A follow-up abdominal x-ray done on 3/8 noted improving swallow obstruction with some contrast residual plus stool in colon. Patient started on Reglan and received MiraLAX and then lactulose and his home dose of linzess and only by evening of 3/10, that he finally have seen difficult bowel movements.  Active Problems:   HYPERTENSION, BENIGN: Blood pressure stable   CAD, NATIVE VESSEL    Abnormality of gait  Acute kidney injury (HCC)In the setting of stage III chronic kidney disease: Resolved with IV fluids, back to baseline, secondary to dehydration initially from nausea and vomiting.    nausea/vomiting/Diarrhea: Felt to be secondary to transient enterovirus, now resolved    Anemia, iron deficiency: Chronic, stable   COPD (chronic obstructive pulmonary disease) ([place]): Stable medical issues during this hospitalization   Essential hypertension   Procedures:  None  Consultations:  Case discussed with general surgery   Discharge Exam: BP 115/49 mmHg  Pulse 79  Temp(Src) 98 F (36.7 C) (Oral)  Resp 18  Ht 6' (1.829 m)  Wt 78.699 kg (173 lb 8 oz)  BMI 23.53 kg/m2  SpO2 97%  General: Alert and oriented 3, no acute distress  Cardiovascular: Regular rate and rhythm, S1-S2  Respiratory: Clear to auscultation bilaterally   Discharge Instructions You were cared for by a hospitalist during your hospital stay. If you have any questions about your discharge medications or the care you received while you were in the hospital after you are discharged, you can call the unit and asked to speak with the hospitalist on call if the hospitalist that took care of you is not available. Once you are discharged, your primary care physician will handle any further medical issues. Please note that NO REFILLS for any discharge medications will be authorized once you are discharged, as it is imperative that you return to your primary care physician (or establish a relationship with a primary care physician if you do not have one) for your aftercare needs so that they can reassess your  need for medications and monitor your lab values.  Discharge Instructions    Diet - low sodium heart healthy    Complete by:  As directed      Increase activity slowly    Complete by:  As directed             Medication List    TAKE these medications        aspirin 81 MG tablet  Take 81 mg by mouth daily.      carvedilol 12.5 MG tablet  Commonly known as:  COREG  TAKE 1 TABLET TWICE DAILY WITH A MEAL     furosemide 20 MG tablet  Commonly known as:  LASIX  Take 40 mg every morning     gemfibrozil 600 MG tablet  Commonly known as:  LOPID  Take 600 mg by mouth 2 (two) times daily before a meal. Reported on 09/20/2015     Glycopyrrolate-Formoterol 9-4.8 MCG/ACT Aero  Commonly known as:  BEVESPI AEROSPHERE  Inhale 2 puffs into the lungs every 12 (twelve) hours.     LINZESS 145 MCG Caps capsule  Generic drug:  Linaclotide  Take 1 capsule by mouth daily with breakfast.     losartan 25 MG tablet  Commonly known as:  COZAAR  Take 1 tablet (25 mg total) by mouth daily.     meclizine 25 MG tablet  Commonly known as:  ANTIVERT  Take 1 tablet (25 mg total) by mouth daily as needed for dizziness.     MIRALAX powder  Generic drug:  polyethylene glycol powder  Take 17 g by mouth daily as needed for mild constipation.     mometasone-formoterol 200-5 MCG/ACT Aero  Commonly known as:  DULERA  Inhale 2 puffs into the lungs 2 (two) times daily.     MYRBETRIQ 50 MG Tb24 tablet  Generic drug:  mirabegron ER  Take 50 mg by mouth daily.     NITROSTAT 0.4 MG SL tablet  Generic drug:  nitroGLYCERIN  PLACE 1 TABLET (0.4 MG TOTAL) UNDER THE TONGUE EVERY 5 (FIVE) MINUTES AS NEEDED. FOR CHEST PAIN     omeprazole 40 MG capsule  Commonly known as:  PRILOSEC  Take 1 capsule (40 mg total) by mouth daily.     pregabalin 75 MG capsule  Commonly known as:  LYRICA  Take 75 mg by mouth 2 (two) times daily.     SYMBICORT 160-4.5 MCG/ACT inhaler  Generic drug:  budesonide-formoterol  INHALE 2 PUFFS FIRST THING IN THE MORNING AND THEN TAKE 2 PUFFS 12 HOURS LATER     tiotropium 18 MCG inhalation capsule  Commonly known as:  SPIRIVA  Place 1 capsule (18 mcg total) into inhaler and inhale daily.     TYLENOL 325 MG tablet  Generic drug:  acetaminophen  Take 650 mg by mouth every 6 (six) hours as needed for  mild pain.     VENTOLIN HFA 108 (90 Base) MCG/ACT inhaler  Generic drug:  albuterol  INHALE 2 PUFFS INTO THE LUNGS EVERY 4 HOURS AS NEEDED FOR WHEEZING OR SHORTNESS OF BREATH     vitamin C 500 MG tablet  Commonly known as:  ASCORBIC ACID  Take 500 mg by mouth daily.       Allergies  Allergen Reactions  . Amoxicillin     REACTION: unspecified  . Clopidogrel Bisulfate Hives    REACTION: red blue, black blotches  . Dilaudid [Hydromorphone Hcl] Hives    Hallucinations, little green men working on  the building  . Doxycycline Hyclate   . Penicillins Hives    Has patient had a PCN reaction causing immediate rash, facial/tongue/throat swelling, SOB or lightheadedness with hypotension: Yes Has patient had a PCN reaction causing severe rash involving mucus membranes or skin necrosis: No Has patient had a PCN reaction that required hospitalization No Has patient had a PCN reaction occurring within the last 10 years: No If all of the above answers are "NO", then may proceed with Cephalosporin use.   . Sulfa Antibiotics Other (See Comments)    Nightmares, hallucinations  . Sulfamethoxazole Other (See Comments)    Nightmares, hallucinations  . Sulfonamide Derivatives Other (See Comments)    Nightmares, hallucinations      The results of significant diagnostics from this hospitalization (including imaging, microbiology, ancillary and laboratory) are listed below for reference.    Significant Diagnostic Studies: Ct Abdomen Pelvis Wo Contrast  10/15/2015  CLINICAL DATA:  Acute onset of generalized abdominal pain and diarrhea. Initial encounter. EXAM: CT ABDOMEN AND PELVIS WITHOUT CONTRAST TECHNIQUE: Multidetector CT imaging of the abdomen and pelvis was performed following the standard protocol without IV contrast. COMPARISON:  CT of the abdomen and pelvis performed 09/12/2012 FINDINGS: Minimal bibasilar atelectasis or scarring is noted. Diffuse coronary artery calcifications are seen. There  is marked thinning of the myocardium at the ventricular apex, with associated calcification and mild focal bulging, reflecting prior myocardial infarction. Calcification is noted at the aortic and mitral valves. Scattered calcified granulomata are noted within the liver. The liver and spleen are otherwise grossly unremarkable. The patient is status post cholecystectomy, with clips noted along the gallbladder fossa. The pancreas and adrenal glands are grossly unremarkable. A few scattered bilateral renal cysts are seen, measuring up to 3.8 cm in size. There is question of minimal associated calcification at a small right renal cyst. Nonspecific perinephric stranding is noted bilaterally. Minimal right-sided hydronephrosis is nonspecific. No definite distal obstruction is seen. Scattered vascular calcifications are seen at the renal hila. No free fluid is identified. The small bowel is unremarkable in appearance. The stomach is within normal limits. No acute vascular abnormalities are seen. Relatively diffuse calcification is noted along the abdominal aorta and its branches. There is minimal ectasia of the infrarenal abdominal aorta, without evidence of aneurysmal dilatation. The patient is status post appendectomy. The colon is largely filled with fluid, corresponding to the patient's diarrhea. The bladder is mildly distended and grossly unremarkable. A reservoir is noted at the right hemipelvis. Scattered brachytherapy seeds are noted at the prostate bed. No inguinal lymphadenopathy is seen. No acute osseous abnormalities are identified. Multilevel vacuum phenomenon is noted along the lumbar spine. There is minimal grade 1 retrolisthesis of L3 on L4, and minimal grade 1 anterolisthesis of L4 on L5, reflecting underlying facet disease. IMPRESSION: 1. Colon largely filled with fluid, corresponding to the patient's diarrhea. 2. Marked thinning of the myocardium at the ventricular apex, with associated calcification and  mild focal bulging, reflecting prior myocardial infarction. 3. Diffuse coronary artery calcifications seen. Calcification at the aortic and mitral valves. 4. Few scattered bilateral renal cysts noted. Minimal nonspecific right-sided hydronephrosis, without evidence of distal obstruction. 5. Relatively diffuse calcification along the abdominal aorta and its branches. 6. Mild degenerative change along the lumbar spine. Electronically Signed   By: Garald Balding M.D.   On: 10/15/2015 06:13   Dg Abd 2 Views  10/17/2015  CLINICAL DATA:  Follow-up small bowel obstruction EXAM: ABDOMEN - 2 VIEW COMPARISON:  10/16/2015 FINDINGS:  Oral contrast is seen primarily throughout the large bowel with air-fluid levels throughout the large bowel. Contrast is seen into the distal sigmoid. Large bowel is relatively decompressed as compared to small bowel, with numerous distended loops of mid abdominal small bowel again identified measuring up to about 4.5 cm in diameter. Small bowel air-fluid levels are stable. There is opacity at the left lung base most consistent with atelectasis. IMPRESSION: Stable findings of small bowel obstruction Electronically Signed   By: Skipper Cliche M.D.   On: 10/17/2015 08:52   Dg Abd Acute W/chest  10/14/2015  CLINICAL DATA:  Acute onset of generalized abdominal bloating. Initial encounter. EXAM: DG ABDOMEN ACUTE W/ 1V CHEST COMPARISON:  Chest radiograph performed 09/20/2015 FINDINGS: The lungs are well-aerated and clear. There is no evidence of focal opacification, pleural effusion or pneumothorax. The cardiomediastinal silhouette is within normal limits. There is dilatation of small bowel loops up to 5.8 cm in maximal diameter, with associated air-fluid levels, concerning for small bowel obstruction. Residual air is still seen in the colon. No free intra-abdominal air is identified on the provided upright view. Clips are noted within the right upper quadrant, reflecting prior cholecystectomy. Clips  are also noted at the right side of the neck. No acute osseous abnormalities are seen; the sacroiliac joints are unremarkable in appearance. IMPRESSION: 1. Dilatation of small bowel loops up to 5.8 cm in maximal diameter, with air-fluid levels, concerning for relatively high-grade small bowel obstruction. 2. No acute cardiopulmonary process seen. These results were called by telephone at the time of interpretation on 10/14/2015 at 11:29 pm to [facility] in the [facility] ER, who verbally acknowledged these results. Electronically Signed   By: Garald Balding M.D.   On: 10/14/2015 23:30   Dg Abd Portable 1v  10/20/2015  CLINICAL DATA:  Followup small bowel obstruction. EXAM: PORTABLE ABDOMEN - 1 VIEW COMPARISON:  10/17/2015.  CT, 10/15/2015. FINDINGS: Mild dilation of small bowel is seen centrally. There is air in stool in the colon as well as some contrast in the left colon. The degree of small bowel dilation is mildly decreased from the prior exam. No new abnormalities. IMPRESSION: 1. Mild improvement in the partial small bowel obstruction with less small bowel dilation noted on the current study. Electronically Signed   By: Lajean Manes M.D.   On: 10/20/2015 11:56   Dg Abd Portable 1v  10/16/2015  CLINICAL DATA:  Small bowel obstruction. EXAM: PORTABLE ABDOMEN - 1 VIEW COMPARISON:  CT scan October 15, 2015 FINDINGS: There is contrast within the decompressed colon. Dilated loops of small bowel are seen diffusely, most prominent in the central and left abdomen. No free air, portal venous gas, or pneumatosis identified. No other acute abnormalities. IMPRESSION: Small bowel obstruction. Electronically Signed   By: Dorise Bullion III M.D   On: 10/16/2015 09:24    Microbiology: Recent Results (from the past 240 hour(s))  Gastrointestinal Panel by PCR , Stool     Status: None   Collection Time: 10/16/15  5:12 PM  Result Value Ref Range Status   Campylobacter species NOT DETECTED NOT DETECTED Final   Plesimonas  shigelloides NOT DETECTED NOT DETECTED Final   Salmonella species NOT DETECTED NOT DETECTED Final   Yersinia enterocolitica NOT DETECTED NOT DETECTED Final   Vibrio species NOT DETECTED NOT DETECTED Final   Vibrio cholerae NOT DETECTED NOT DETECTED Final   Enteroaggregative E coli (EAEC) NOT DETECTED NOT DETECTED Final   Enteropathogenic E coli (EPEC) NOT DETECTED  NOT DETECTED Final   Enterotoxigenic E coli (ETEC) NOT DETECTED NOT DETECTED Final   Shiga like toxin producing E coli (STEC) NOT DETECTED NOT DETECTED Final   E. coli O157 NOT DETECTED NOT DETECTED Final   Shigella/Enteroinvasive E coli (EIEC) NOT DETECTED NOT DETECTED Final   Cryptosporidium NOT DETECTED NOT DETECTED Final   Cyclospora cayetanensis NOT DETECTED NOT DETECTED Final   Entamoeba histolytica NOT DETECTED NOT DETECTED Final   Giardia lamblia NOT DETECTED NOT DETECTED Final   Adenovirus F40/41 NOT DETECTED NOT DETECTED Final   Astrovirus NOT DETECTED NOT DETECTED Final   Norovirus GI/GII NOT DETECTED NOT DETECTED Final   Rotavirus A NOT DETECTED NOT DETECTED Final   Sapovirus (I, II, IV, and V) NOT DETECTED NOT DETECTED Final  C difficile quick scan w PCR reflex     Status: None   Collection Time: 10/16/15  5:12 PM  Result Value Ref Range Status   C Diff antigen NEGATIVE NEGATIVE Final   C Diff toxin NEGATIVE NEGATIVE Final   C Diff interpretation Negative for toxigenic C. difficile  Final     Labs: Basic Metabolic Panel:  Recent Labs Lab 10/17/15 0530 10/17/15 2340 10/18/15 0811 10/19/15 0607 10/22/15 0539  NA 142 142 144 140 140  K 4.3 4.2 4.2 4.0 4.5  CL 114* 111 115* 112* 109  CO2 21* 22 20* 20* 22  GLUCOSE 94 150* 143* 111* 106*  BUN 21* 18 16 17 14   CREATININE 1.28* 1.28* 1.28* 1.39* 1.18  CALCIUM 8.4* 8.7* 8.5* 8.4* 8.5*   Liver Function Tests: No results for input(s): AST, ALT, ALKPHOS, BILITOT, PROT, ALBUMIN in the last 168 hours. No results for input(s): LIPASE, AMYLASE in the last 168  hours. No results for input(s): AMMONIA in the last 168 hours. CBC:  Recent Labs Lab 10/17/15 0530 10/18/15 0811 10/19/15 0607 10/22/15 0539  WBC 6.2 7.1 6.7 5.4  HGB 7.6* 7.6* 7.8* 7.5*  HCT 24.6* 25.5* 24.8* 25.1*  MCV 62.3* 62.0* 61.7* 61.8*  PLT 134* 130* 148* 153   Cardiac Enzymes: No results for input(s): CKTOTAL, CKMB, CKMBINDEX, TROPONINI in the last 168 hours. BNP: BNP (last 3 results)  Recent Labs  01/08/15 0729 01/08/15 1000 09/20/15 1833  BNP 569.6* 439.7* 196.1*    ProBNP (last 3 results) No results for input(s): PROBNP in the last 8760 hours.  CBG: No results for input(s): GLUCAP in the last 168 hours.     Signed:  Annita Brod  [facility] 10/23/2015, 12:37 PM

## 2015-10-24 ENCOUNTER — Emergency Department (HOSPITAL_COMMUNITY): Payer: Medicare Other

## 2015-10-24 ENCOUNTER — Inpatient Hospital Stay (HOSPITAL_COMMUNITY)
Admission: EM | Admit: 2015-10-24 | Discharge: 2015-11-02 | DRG: 208 | Disposition: A | Discharge status: Skilled Nursing Facility | Payer: Medicare Other | Attending: Internal Medicine | Admitting: Internal Medicine

## 2015-10-24 ENCOUNTER — Encounter (HOSPITAL_COMMUNITY): Payer: Self-pay | Admitting: Emergency Medicine

## 2015-10-24 DIAGNOSIS — Z833 Family history of diabetes mellitus: Secondary | ICD-10-CM

## 2015-10-24 DIAGNOSIS — J439 Emphysema, unspecified: Secondary | ICD-10-CM | POA: Diagnosis not present

## 2015-10-24 DIAGNOSIS — Z8673 Personal history of transient ischemic attack (TIA), and cerebral infarction without residual deficits: Secondary | ICD-10-CM

## 2015-10-24 DIAGNOSIS — H9192 Unspecified hearing loss, left ear: Secondary | ICD-10-CM | POA: Diagnosis present

## 2015-10-24 DIAGNOSIS — K219 Gastro-esophageal reflux disease without esophagitis: Secondary | ICD-10-CM | POA: Diagnosis present

## 2015-10-24 DIAGNOSIS — Z8546 Personal history of malignant neoplasm of prostate: Secondary | ICD-10-CM | POA: Diagnosis not present

## 2015-10-24 DIAGNOSIS — Z01818 Encounter for other preprocedural examination: Secondary | ICD-10-CM

## 2015-10-24 DIAGNOSIS — R739 Hyperglycemia, unspecified: Secondary | ICD-10-CM | POA: Diagnosis not present

## 2015-10-24 DIAGNOSIS — G4733 Obstructive sleep apnea (adult) (pediatric): Secondary | ICD-10-CM | POA: Diagnosis present

## 2015-10-24 DIAGNOSIS — Z9049 Acquired absence of other specified parts of digestive tract: Secondary | ICD-10-CM

## 2015-10-24 DIAGNOSIS — Z880 Allergy status to penicillin: Secondary | ICD-10-CM | POA: Diagnosis not present

## 2015-10-24 DIAGNOSIS — J449 Chronic obstructive pulmonary disease, unspecified: Secondary | ICD-10-CM | POA: Diagnosis not present

## 2015-10-24 DIAGNOSIS — N179 Acute kidney failure, unspecified: Secondary | ICD-10-CM | POA: Diagnosis present

## 2015-10-24 DIAGNOSIS — K567 Ileus, unspecified: Secondary | ICD-10-CM | POA: Diagnosis present

## 2015-10-24 DIAGNOSIS — N183 Chronic kidney disease, stage 3 unspecified: Secondary | ICD-10-CM | POA: Diagnosis present

## 2015-10-24 DIAGNOSIS — Z808 Family history of malignant neoplasm of other organs or systems: Secondary | ICD-10-CM | POA: Diagnosis not present

## 2015-10-24 DIAGNOSIS — I5043 Acute on chronic combined systolic (congestive) and diastolic (congestive) heart failure: Secondary | ICD-10-CM | POA: Diagnosis present

## 2015-10-24 DIAGNOSIS — J9602 Acute respiratory failure with hypercapnia: Secondary | ICD-10-CM | POA: Diagnosis not present

## 2015-10-24 DIAGNOSIS — Z823 Family history of stroke: Secondary | ICD-10-CM

## 2015-10-24 DIAGNOSIS — Y95 Nosocomial condition: Secondary | ICD-10-CM | POA: Diagnosis present

## 2015-10-24 DIAGNOSIS — I252 Old myocardial infarction: Secondary | ICD-10-CM

## 2015-10-24 DIAGNOSIS — I952 Hypotension due to drugs: Secondary | ICD-10-CM | POA: Diagnosis not present

## 2015-10-24 DIAGNOSIS — J189 Pneumonia, unspecified organism: Secondary | ICD-10-CM | POA: Diagnosis present

## 2015-10-24 DIAGNOSIS — G609 Hereditary and idiopathic neuropathy, unspecified: Secondary | ICD-10-CM | POA: Diagnosis present

## 2015-10-24 DIAGNOSIS — J440 Chronic obstructive pulmonary disease with acute lower respiratory infection: Principal | ICD-10-CM | POA: Diagnosis present

## 2015-10-24 DIAGNOSIS — Z7982 Long term (current) use of aspirin: Secondary | ICD-10-CM | POA: Diagnosis not present

## 2015-10-24 DIAGNOSIS — K56600 Partial intestinal obstruction, unspecified as to cause: Secondary | ICD-10-CM

## 2015-10-24 DIAGNOSIS — G9341 Metabolic encephalopathy: Secondary | ICD-10-CM | POA: Diagnosis not present

## 2015-10-24 DIAGNOSIS — Z8249 Family history of ischemic heart disease and other diseases of the circulatory system: Secondary | ICD-10-CM | POA: Diagnosis not present

## 2015-10-24 DIAGNOSIS — I9589 Other hypotension: Secondary | ICD-10-CM | POA: Diagnosis not present

## 2015-10-24 DIAGNOSIS — R609 Edema, unspecified: Secondary | ICD-10-CM

## 2015-10-24 DIAGNOSIS — I2510 Atherosclerotic heart disease of native coronary artery without angina pectoris: Secondary | ICD-10-CM | POA: Diagnosis present

## 2015-10-24 DIAGNOSIS — E872 Acidosis: Secondary | ICD-10-CM | POA: Diagnosis present

## 2015-10-24 DIAGNOSIS — Z79899 Other long term (current) drug therapy: Secondary | ICD-10-CM

## 2015-10-24 DIAGNOSIS — I10 Essential (primary) hypertension: Secondary | ICD-10-CM | POA: Diagnosis present

## 2015-10-24 DIAGNOSIS — R269 Unspecified abnormalities of gait and mobility: Secondary | ICD-10-CM | POA: Diagnosis present

## 2015-10-24 DIAGNOSIS — I129 Hypertensive chronic kidney disease with stage 1 through stage 4 chronic kidney disease, or unspecified chronic kidney disease: Secondary | ICD-10-CM | POA: Diagnosis present

## 2015-10-24 DIAGNOSIS — J441 Chronic obstructive pulmonary disease with (acute) exacerbation: Secondary | ICD-10-CM | POA: Diagnosis present

## 2015-10-24 DIAGNOSIS — J9601 Acute respiratory failure with hypoxia: Secondary | ICD-10-CM | POA: Diagnosis present

## 2015-10-24 DIAGNOSIS — Z87891 Personal history of nicotine dependence: Secondary | ICD-10-CM | POA: Diagnosis not present

## 2015-10-24 DIAGNOSIS — J9622 Acute and chronic respiratory failure with hypercapnia: Secondary | ICD-10-CM | POA: Diagnosis not present

## 2015-10-24 DIAGNOSIS — T380X5A Adverse effect of glucocorticoids and synthetic analogues, initial encounter: Secondary | ICD-10-CM | POA: Diagnosis not present

## 2015-10-24 DIAGNOSIS — D569 Thalassemia, unspecified: Secondary | ICD-10-CM | POA: Diagnosis present

## 2015-10-24 DIAGNOSIS — I255 Ischemic cardiomyopathy: Secondary | ICD-10-CM | POA: Diagnosis present

## 2015-10-24 DIAGNOSIS — K56609 Unspecified intestinal obstruction, unspecified as to partial versus complete obstruction: Secondary | ICD-10-CM

## 2015-10-24 DIAGNOSIS — R0602 Shortness of breath: Secondary | ICD-10-CM | POA: Diagnosis present

## 2015-10-24 DIAGNOSIS — I5021 Acute systolic (congestive) heart failure: Secondary | ICD-10-CM | POA: Diagnosis not present

## 2015-10-24 DIAGNOSIS — M79609 Pain in unspecified limb: Secondary | ICD-10-CM | POA: Diagnosis not present

## 2015-10-24 DIAGNOSIS — M79669 Pain in unspecified lower leg: Secondary | ICD-10-CM

## 2015-10-24 DIAGNOSIS — J810 Acute pulmonary edema: Secondary | ICD-10-CM | POA: Diagnosis not present

## 2015-10-24 DIAGNOSIS — E7800 Pure hypercholesterolemia, unspecified: Secondary | ICD-10-CM | POA: Diagnosis present

## 2015-10-24 LAB — COMPREHENSIVE METABOLIC PANEL
ALBUMIN: 3.1 g/dL — AB (ref 3.5–5.0)
ALK PHOS: 56 U/L (ref 38–126)
ALT: 12 U/L — AB (ref 17–63)
AST: 18 U/L (ref 15–41)
Anion gap: 9 (ref 5–15)
BUN: 18 mg/dL (ref 6–20)
CALCIUM: 8.5 mg/dL — AB (ref 8.9–10.3)
CHLORIDE: 108 mmol/L (ref 101–111)
CO2: 22 mmol/L (ref 22–32)
CREATININE: 1.38 mg/dL — AB (ref 0.61–1.24)
GFR calc Af Amer: 51 mL/min — ABNORMAL LOW (ref >60)
GFR calc non Af Amer: 44 mL/min — ABNORMAL LOW (ref >60)
GLUCOSE: 113 mg/dL — AB (ref 65–99)
Potassium: 4.5 mmol/L (ref 3.5–5.1)
SODIUM: 139 mmol/L (ref 135–145)
Total Bilirubin: 0.8 mg/dL (ref 0.3–1.2)
Total Protein: 6.1 g/dL — ABNORMAL LOW (ref 6.5–8.1)

## 2015-10-24 LAB — INFLUENZA PANEL BY PCR (TYPE A & B)
H1N1 flu by pcr: NOT DETECTED
Influenza A By PCR: NEGATIVE
Influenza B By PCR: NEGATIVE

## 2015-10-24 LAB — CBC WITH DIFFERENTIAL/PLATELET
Basophils Absolute: 0 10*3/uL (ref 0.0–0.1)
Basophils Relative: 0 %
EOS PCT: 2 %
Eosinophils Absolute: 0.1 10*3/uL (ref 0.0–0.7)
HCT: 25.5 % — ABNORMAL LOW (ref 39.0–52.0)
HEMOGLOBIN: 7.9 g/dL — AB (ref 13.0–17.0)
LYMPHS ABS: 1.2 10*3/uL (ref 0.7–4.0)
LYMPHS PCT: 16 %
MCH: 18.9 pg — ABNORMAL LOW (ref 26.0–34.0)
MCHC: 31 g/dL (ref 30.0–36.0)
MCV: 61.2 fL — ABNORMAL LOW (ref 78.0–100.0)
MONOS PCT: 7 %
Monocytes Absolute: 0.5 10*3/uL (ref 0.1–1.0)
Neutro Abs: 5.6 10*3/uL (ref 1.7–7.7)
Neutrophils Relative %: 75 %
PLATELETS: 186 10*3/uL (ref 150–400)
RBC: 4.17 MIL/uL — AB (ref 4.22–5.81)
RDW: 17.2 % — ABNORMAL HIGH (ref 11.5–15.5)
WBC: 7.4 10*3/uL (ref 4.0–10.5)

## 2015-10-24 LAB — I-STAT CG4 LACTIC ACID, ED: Lactic Acid, Venous: 1.52 mmol/L (ref 0.5–2.0)

## 2015-10-24 MED ORDER — AZTREONAM 1 G IJ SOLR: 1.0000 g | Freq: Three times a day (TID) | INTRAMUSCULAR | Status: DC

## 2015-10-24 MED ORDER — VANCOMYCIN HCL IN DEXTROSE 1-5 GM/200ML-% IV SOLN
1000.0000 mg | INTRAVENOUS | Status: AC
  Administered 2015-10-24: 1000 mg via INTRAVENOUS
  Filled 2015-10-24: qty 200

## 2015-10-24 MED ORDER — METHYLPREDNISOLONE SODIUM SUCC 40 MG IJ SOLR
40.0000 mg | Freq: Two times a day (BID) | INTRAMUSCULAR | Status: DC
  Administered 2015-10-24 – 2015-10-30 (×12): 40 mg via INTRAVENOUS
  Filled 2015-10-24 (×12): qty 1

## 2015-10-24 MED ORDER — ASPIRIN EC 81 MG PO TBEC
81.0000 mg | DELAYED_RELEASE_TABLET | Freq: Every day | ORAL | Status: DC
  Administered 2015-10-25: 81 mg via ORAL
  Filled 2015-10-24 (×2): qty 1

## 2015-10-24 MED ORDER — CHLORHEXIDINE GLUCONATE 0.12 % MT SOLN
15.0000 mL | Freq: Two times a day (BID) | OROMUCOSAL | Status: DC
  Administered 2015-10-24 – 2015-11-02 (×17): 15 mL via OROMUCOSAL
  Filled 2015-10-24 (×16): qty 15

## 2015-10-24 MED ORDER — CETYLPYRIDINIUM CHLORIDE 0.05 % MT LIQD
7.0000 mL | Freq: Two times a day (BID) | OROMUCOSAL | Status: DC
  Administered 2015-10-25 – 2015-11-02 (×16): 7 mL via OROMUCOSAL

## 2015-10-24 MED ORDER — ENOXAPARIN SODIUM 40 MG/0.4ML ~~LOC~~ SOLN
40.0000 mg | SUBCUTANEOUS | Status: DC
  Administered 2015-10-24 – 2015-11-01 (×9): 40 mg via SUBCUTANEOUS
  Filled 2015-10-24 (×9): qty 0.4

## 2015-10-24 MED ORDER — IPRATROPIUM-ALBUTEROL 0.5-2.5 (3) MG/3ML IN SOLN
3.0000 mL | Freq: Four times a day (QID) | RESPIRATORY_TRACT | Status: DC
  Administered 2015-10-25 (×3): 3 mL via RESPIRATORY_TRACT
  Filled 2015-10-24 (×5): qty 3

## 2015-10-24 MED ORDER — SIMVASTATIN 10 MG PO TABS
10.0000 mg | ORAL_TABLET | Freq: Every day | ORAL | Status: DC
  Administered 2015-10-24 – 2015-10-25 (×2): 10 mg via ORAL
  Filled 2015-10-24 (×2): qty 1

## 2015-10-24 MED ORDER — VANCOMYCIN HCL IN DEXTROSE 1-5 GM/200ML-% IV SOLN
1000.0000 mg | INTRAVENOUS | Status: DC
  Administered 2015-10-25 – 2015-10-27 (×3): 1000 mg via INTRAVENOUS
  Filled 2015-10-24 (×3): qty 200

## 2015-10-24 MED ORDER — SODIUM CHLORIDE 0.9 % IV BOLUS (SEPSIS)
1000.0000 mL | Freq: Once | INTRAVENOUS | Status: AC
  Administered 2015-10-24: 1000 mL via INTRAVENOUS

## 2015-10-24 MED ORDER — ONDANSETRON HCL 4 MG/2ML IJ SOLN: 4.0000 mg | Freq: Four times a day (QID) | INTRAMUSCULAR | Status: DC | PRN

## 2015-10-24 MED ORDER — PREGABALIN 75 MG PO CAPS
75.0000 mg | ORAL_CAPSULE | Freq: Two times a day (BID) | ORAL | Status: DC
  Administered 2015-10-24 – 2015-10-26 (×4): 75 mg via ORAL
  Filled 2015-10-24 (×4): qty 1

## 2015-10-24 MED ORDER — CARVEDILOL 12.5 MG PO TABS
12.5000 mg | ORAL_TABLET | Freq: Two times a day (BID) | ORAL | Status: DC
  Administered 2015-10-24 – 2015-10-26 (×4): 12.5 mg via ORAL
  Filled 2015-10-24 (×4): qty 1

## 2015-10-24 MED ORDER — SODIUM CHLORIDE 0.9 % IV SOLN
INTRAVENOUS | Status: DC
  Administered 2015-10-24: 19:00:00 via INTRAVENOUS

## 2015-10-24 MED ORDER — ONDANSETRON HCL 4 MG PO TABS: 4.0000 mg | ORAL_TABLET | Freq: Four times a day (QID) | ORAL | Status: DC | PRN

## 2015-10-24 MED ORDER — IPRATROPIUM-ALBUTEROL 0.5-2.5 (3) MG/3ML IN SOLN
3.0000 mL | RESPIRATORY_TRACT | Status: DC
  Administered 2015-10-24: 3 mL via RESPIRATORY_TRACT
  Filled 2015-10-24: qty 3

## 2015-10-24 MED ORDER — IPRATROPIUM-ALBUTEROL 0.5-2.5 (3) MG/3ML IN SOLN
3.0000 mL | Freq: Once | RESPIRATORY_TRACT | Status: AC
  Administered 2015-10-24: 3 mL via RESPIRATORY_TRACT
  Filled 2015-10-24: qty 3

## 2015-10-24 MED ORDER — ACETAMINOPHEN 650 MG RE SUPP: 650.0000 mg | Freq: Four times a day (QID) | RECTAL | Status: DC | PRN

## 2015-10-24 MED ORDER — DEXTROSE 5 % IV SOLN
1.0000 g | Freq: Three times a day (TID) | INTRAVENOUS | Status: DC
  Administered 2015-10-24 – 2015-10-28 (×10): 1 g via INTRAVENOUS
  Filled 2015-10-24 (×13): qty 1

## 2015-10-24 MED ORDER — ACETAMINOPHEN 325 MG PO TABS: 650.0000 mg | ORAL_TABLET | Freq: Four times a day (QID) | ORAL | Status: DC | PRN

## 2015-10-24 MED ORDER — SODIUM CHLORIDE 0.9% FLUSH
3.0000 mL | Freq: Two times a day (BID) | INTRAVENOUS | Status: DC
  Administered 2015-10-26 – 2015-11-02 (×15): 3 mL via INTRAVENOUS

## 2015-10-24 MED ORDER — LOSARTAN POTASSIUM 50 MG PO TABS: 25.0000 mg | ORAL_TABLET | Freq: Every day | ORAL | Status: DC

## 2015-10-24 MED ORDER — PREDNISONE 20 MG PO TABS
60.0000 mg | ORAL_TABLET | Freq: Once | ORAL | Status: AC
  Administered 2015-10-24: 60 mg via ORAL
  Filled 2015-10-24: qty 3

## 2015-10-24 MED ORDER — ALBUTEROL SULFATE (2.5 MG/3ML) 0.083% IN NEBU: 2.5000 mg | INHALATION_SOLUTION | RESPIRATORY_TRACT | Status: DC | PRN

## 2015-10-24 MED ORDER — DEXTROSE 5 % IV SOLN
1.0000 g | INTRAVENOUS | Status: AC
  Administered 2015-10-24: 1 g via INTRAVENOUS
  Filled 2015-10-24: qty 1

## 2015-10-24 MED ORDER — ALUM & MAG HYDROXIDE-SIMETH 200-200-20 MG/5ML PO SUSP: 30.0000 mL | Freq: Four times a day (QID) | ORAL | Status: DC | PRN

## 2015-10-24 MED ORDER — MOMETASONE FURO-FORMOTEROL FUM 200-5 MCG/ACT IN AERO
2.0000 | INHALATION_SPRAY | Freq: Two times a day (BID) | RESPIRATORY_TRACT | Status: DC
  Administered 2015-10-24 – 2015-10-25 (×2): 2 via RESPIRATORY_TRACT
  Filled 2015-10-24: qty 8.8

## 2015-10-24 NOTE — H&P (Signed)
[facility] History and Physical  DEMITRIOS DITTOE [id number] DOB: October 15, 1923 DOA: 10/24/2015  Referring physician:  PCP:  Melinda Crutch, MD   Chief Complaint:  Cough/shortness of breath  HPI: [name] is a 82 y.o. male  With a past medical history of chronic obstructive pulmonary disease,  Stage III chronic disease, hypertension, recently admitted to the medicine service from  10/14/2015 through 10/23/2015 where  He was treated for small bowel obstruction/ileus. He showed clinical improvement with advancement of diet and was discharged home in stable condition. In the last 24 hours he has  Developed worsening  Shortness of breath associated with cough, having a thick green/yellow sputum production, wheezing, generalized weakness, malaise, poor appetite.  A chest x-ray performed in the emergency department revealed presence of lower lobe airspace disease concerning for healthcare associated pneumonia.   Review of Systems:  Constitutional:  No weight loss, night sweats, positive for Fevers, chills, fatigue.  HEENT:  No headaches, Difficulty swallowing,Tooth/dental problems,Sore throat,  No sneezing, itching, ear ache, nasal congestion, post nasal drip,  Cardio-vascular:  No chest pain, Orthopnea, PND, swelling in lower extremities, anasarca, dizziness, palpitations  GI:  No heartburn, indigestion, abdominal pain, nausea, vomiting, diarrhea, change in bowel habits, loss of appetite  Resp:   Positive for shortness of breath with exertion or at rest, excess mucus,  productive cough.  No non-productive cough, No coughing up of blood.No change in color of mucus.No wheezing.No chest wall deformity  Skin:  no rash or lesions.  GU:  no dysuria, change in color of urine, no urgency or frequency. No flank pain.  Musculoskeletal:  No joint pain or swelling. No decreased range of motion. No back pain.  Psych:  No change in mood or affect. No depression or anxiety. No memory loss.   Past Medical  History  Diagnosis Date  . Carotid artery stenosis   . Essential hypertension   . Myocardial infarction ([place]) 1979  . Coronary artery disease     Known occluded proximal LAD with collaterals, moderate RCA disease - managed medically 2009  . COPD (chronic obstructive pulmonary disease) ([place])   . History of stroke   . Prostate cancer ([place])   . Arthritis   . Foot drop, right   . Neuropathy ([place])   . Anemia   . Urinary incontinence, male, stress   . GERD (gastroesophageal reflux disease)   . Thalassemia   . Wears dentures   . Deafness in left ear     Wears hearing aide in right ear  . Sleep apnea     CPAP  . Ischemic cardiomyopathy     LVEF 40-45%  . Hypercholesteremia   . Anemia   . Osteoarthritis   . Stroke ([place])   . Heart attack ([place])   . Vertigo   . Hereditary and idiopathic peripheral neuropathy 03/01/2015  . Abnormality of gait 03/01/2015   Past Surgical History  Procedure Laterality Date  . Penile prosthesis implant    . Cholecystectomy    . Right carotid endarterectomy    . Back surgery    . Lumbar laminectomy  03/24/10    L3-4 and L4-5  . Ankle surgery      Bone infection  . Cardiac catheterization    . Appendectomy    . Joint replacement Left   . Radioactive seed implant  2009  . Pars plana vitrectomy  03/19/2012    Procedure: PARS PLANA VITRECTOMY WITH 25 GAUGE;  Surgeon: Hayden Pedro, MD;  Location:  [facility];  Service: Ophthalmology;  Laterality: Right;  . Carotid endarterectomy Right     CEA - 15 yrs ago  . Spine surgery    . Eye surgery    . Cataract extraction w/ intraocular lens implant Left   . Endarterectomy Left 09/28/2014    Procedure: Left Carotid Endarterectomy;  Surgeon: Rosetta Posner, MD;  Location: [facility];  Service: Vascular;  Laterality: Left;  . Patch angioplasty Left 09/28/2014    Procedure: WITH DACRON PATCH ANGIOPLASTY;  Surgeon: Rosetta Posner, MD;  Location: [facility];  Service: Vascular;  Laterality: Left;   Social History:  reports that he  quit smoking about 38 years ago. His smoking use included Cigarettes. He has a 20 pack-year smoking history. He has never used smokeless tobacco. He reports that he does not drink alcohol or use illicit drugs.  Allergies  Allergen Reactions  . Clopidogrel Bisulfate Hives    REACTION: red blue, black blotches  . Dilaudid [Hydromorphone Hcl] Hives    Hallucinations, little green men working on the building  . Penicillins Hives    Has patient had a PCN reaction causing immediate rash, facial/tongue/throat swelling, SOB or lightheadedness with hypotension: Yes Has patient had a PCN reaction causing severe rash involving mucus membranes or skin necrosis: No Has patient had a PCN reaction that required hospitalization No Has patient had a PCN reaction occurring within the last 10 years: No If all of the above answers are "NO", then may proceed with Cephalosporin use.   . Sulfa Antibiotics Other (See Comments)    Nightmares, hallucinations  . Sulfonamide Derivatives Other (See Comments)    Nightmares, hallucinations  . Doxycycline Hyclate Rash    Family History  Problem Relation Age of Onset  . Thalassemia      Family history  . Cancer Mother 36    Bone cancer  . Diabetes Mother   . Heart disease Mother   . Heart attack Mother   . Hypertension Mother   . Stroke Father 62  . Heart disease Father   . Hypertension Father   . Hypertension Sister   . Diabetes Sister   . Heart disease Brother   . Heart attack Brother 84  . Cancer Brother     Prior to Admission medications   Medication Sig Start Date End Date Taking? Authorizing Provider  acetaminophen (TYLENOL) 325 MG tablet Take 650 mg by mouth every 6 (six) hours as needed for mild pain.    Yes Historical Provider, MD  aspirin EC 81 MG tablet Take 81 mg by mouth daily.   Yes Historical Provider, MD  carvedilol (COREG) 12.5 MG tablet TAKE 1 TABLET TWICE DAILY WITH A MEAL 06/08/15  Yes Dorothy Spark, MD  furosemide (LASIX) 20 MG  tablet Take 40 mg every morning 10/23/15  Yes Annita Brod, MD  Glycopyrrolate-Formoterol (BEVESPI AEROSPHERE) 9-4.8 MCG/ACT AERO Inhale 2 puffs into the lungs every 12 (twelve) hours. 07/19/15  Yes Tanda Rockers, MD  LINZESS 145 MCG CAPS capsule Take 1 capsule by mouth daily with breakfast. 06/29/15  Yes Historical Provider, MD  losartan (COZAAR) 25 MG tablet Take 1 tablet (25 mg total) by mouth daily. 01/14/15  Yes Marijean Heath, NP  meclizine (ANTIVERT) 25 MG tablet Take 1 tablet (25 mg total) by mouth daily as needed for dizziness. 01/29/15  Yes Dorothy Spark, MD  mometasone-formoterol (DULERA) 200-5 MCG/ACT AERO Inhale 2 puffs into the lungs 2 (two) times daily. 10/01/15  Yes Legrand Como  Mckinley Jewel, MD  MYRBETRIQ 50 MG TB24 tablet Take 50 mg by mouth daily. 06/29/15  Yes Historical Provider, MD  NITROSTAT 0.4 MG SL tablet PLACE 1 TABLET (0.4 MG TOTAL) UNDER THE TONGUE EVERY 5 (FIVE) MINUTES AS NEEDED. FOR CHEST PAIN 04/08/15  Yes Dorothy Spark, MD  omeprazole (PRILOSEC) 40 MG capsule Take 1 capsule (40 mg total) by mouth daily. 05/21/15  Yes Dorothy Spark, MD  polyethylene glycol powder (MIRALAX) powder Take 17 g by mouth daily as needed for mild constipation.    Yes Historical Provider, MD  pregabalin (LYRICA) 75 MG capsule Take 75 mg by mouth 2 (two) times daily.    Yes Historical Provider, MD  simvastatin (ZOCOR) 10 MG tablet Take 10 mg by mouth daily at 6 PM.  10/11/15  Yes Historical Provider, MD  SYMBICORT 160-4.5 MCG/ACT inhaler INHALE 2 PUFFS FIRST THING IN THE MORNING AND THEN TAKE 2 PUFFS 12 HOURS LATER 07/23/15  Yes Tanda Rockers, MD  tiotropium (SPIRIVA) 18 MCG inhalation capsule Place 1 capsule (18 mcg total) into inhaler and inhale daily. 09/23/15  Yes Maryann Mikhail, DO  VENTOLIN HFA 108 (90 BASE) MCG/ACT inhaler INHALE 2 PUFFS INTO THE LUNGS EVERY 4 HOURS AS NEEDED FOR WHEEZING OR SHORTNESS OF BREATH Patient taking differently: INHALE 2 PUFFS INTO THE LUNGS daily. 07/12/15   Yes Tanda Rockers, MD  vitamin C (ASCORBIC ACID) 500 MG tablet Take 500 mg by mouth daily.   Yes Historical Provider, MD   Physical Exam: Filed Vitals:   10/24/15 1311 10/24/15 1417 10/24/15 1650  BP: 99/51  120/61  Pulse: 71  74  Temp: 97.8 F (36.6 C)  97.5 F (36.4 C)  TempSrc: Oral  Oral  Resp: 16  18  Height:   6' (1.829 m)  Weight:   80.287 kg (177 lb)  SpO2: 96% 98% 97%    Wt Readings from Last 3 Encounters:  10/24/15 80.287 kg (177 lb)  10/15/15 78.699 kg (173 lb 8 oz)  09/23/15 77.384 kg (170 lb 9.6 oz)    General:   Ill-appearing, in no distress, he is hard of hearing. Appears dyspneic Eyes: PERRL, normal lids, irises & conjunctiva ENT: grossly normal hearing, lips & tongue Neck: no LAD, masses or thyromegaly Cardiovascular:   Regular rate and rhythm normal S1-S2 no murmurs rubs or gallops Telemetry: SR, no arrhythmias  Respiratory: Coarse respiratory sounds, diminished breath sounds with expiratory wheezes, positive bilateral rhonchi and crackles Abdomen: soft, ntnd,  He had positive bowel sounds Skin: no rash or induration seen on limited exam Musculoskeletal: grossly normal tone BUE/BLE Psychiatric: grossly normal mood and affect, speech fluent and appropriate Neurologic: grossly non-focal.          Labs on Admission:  Basic Metabolic Panel:  Recent Labs Lab 10/17/15 2340 10/18/15 0811 10/19/15 0607 10/22/15 0539 10/24/15 1358  NA 142 144 140 140 139  K 4.2 4.2 4.0 4.5 4.5  CL 111 115* 112* 109 108  CO2 22 20* 20* 22 22  GLUCOSE 150* 143* 111* 106* 113*  BUN 18 16 17 14 18   CREATININE 1.28* 1.28* 1.39* 1.18 1.38*  CALCIUM 8.7* 8.5* 8.4* 8.5* 8.5*   Liver Function Tests:  Recent Labs Lab 10/24/15 1358  AST 18  ALT 12*  ALKPHOS 56  BILITOT 0.8  PROT 6.1*  ALBUMIN 3.1*   No results for input(s): LIPASE, AMYLASE in the last 168 hours. No results for input(s): AMMONIA in the last 168 hours. CBC:  Recent Labs Lab 10/18/15 0811  10/19/15 0607 10/22/15 0539 10/24/15 1358  WBC 7.1 6.7 5.4 7.4  NEUTROABS  --   --   --  5.6  HGB 7.6* 7.8* 7.5* 7.9*  HCT 25.5* 24.8* 25.1* 25.5*  MCV 62.0* 61.7* 61.8* 61.2*  PLT 130* 148* 153 186   Cardiac Enzymes: No results for input(s): CKTOTAL, CKMB, CKMBINDEX, TROPONINI in the last 168 hours.  BNP (last 3 results)  Recent Labs  01/08/15 0729 01/08/15 1000 09/20/15 1833  BNP 569.6* 439.7* 196.1*    ProBNP (last 3 results) No results for input(s): PROBNP in the last 8760 hours.  CBG: No results for input(s): GLUCAP in the last 168 hours.  Radiological Exams on Admission: Dg Chest 2 View  10/24/2015  CLINICAL DATA:  Productive cough for 2-3 days. EXAM: CHEST - 2 VIEW COMPARISON:  Two-view chest x-ray 09/20/2015. FINDINGS: The heart size is normal. Small bilateral pleural effusions are now present. Mild bibasilar airspace opacities are evident posteriorly. The upper lung fields are clear. Emphysematous changes are again noted. Atherosclerotic calcifications are present in the aortic arch. Surgical clips are present in the right neck and at the gallbladder fossa. IMPRESSION: 1. New bilateral pleural effusions and posterior lower lobe airspace disease. While this may represent atelectasis, infection is not excluded. 2. Emphysema. 3. Atherosclerosis of the thoracic aorta. Electronically Signed   By: San Morelle M.D.   On: 10/24/2015 13:38    EKG: Independently reviewed.   Assessment/Plan Active Problems:   DIASTOLIC HEART FAILURE, CHRONIC   Stage III chronic kidney disease   COPD (chronic obstructive pulmonary disease) (HCC)   Essential hypertension   HCAP (healthcare-associated pneumonia)   AKI (acute kidney injury) ([place])   1.  Suspected healthcare associated pneumonia.  Enid Derry presenting with signs and symptoms consistent with pneumonia. He  Had a previous hospitalization, discharged on 10/23/2015 at which time was treated for small bowel obstruction/ileus.  Chest x-ray performed in the emergency department showing the presence of posterior lower lobe airspace disease consistent with pneumonia.  Will obtain blood cultures and treat with him with broad-spectrum IV antibiotic therapy with vancomycin and aztreonam. He has a history of penicillin allergy. Repeat chest x-ray in a.m.  Plan also check influenza swab as well.  2. COPD.  Mr. Balderson has a history of tobacco abuse and COPD as I suspect COPD exacerbation may be contributing to symptoms. Infection may have precipitated COPD exacerbation in his case. Will treat with systemic steroids, Solu-Medrol 40 mg IV twice a day along with scheduled duo nebs. 3.  History of small bowel obstruction/ileus. He does not present with signs and symptoms consistent with SBO, as he denies nausea, vomiting, reporting having regular bowel movements.  Plan to continue monitoring. 4.  Acute kidney injury. Lab work on admission revealing a creatinine of 3.8, previously 1.18 2 days ago. Suspect secondary to prerenal azotemia.  Plan to hold Lasix and provide gentle IV fluid hydration overnight. 5.  History of diastolic congestive heart failure.  As mentioned above, having mild elevation to his creatinine.  Will provide gentle IV fluid hydration overnight , reassess volume status in a.m. 6.  Hypertension.  Blood pressure stable, continue  Coreg 12.5 mg by mouth twice a day , holding losartan given rising creatinine. 7.  DVT prophylaxis. Lovenox   Code Status:  Full code Family Communication:  I spoke to family members at bedside Disposition Plan:  Anticipate he will require greater than 2 nights hospitalization  Time spent:  70 minutes  Kelvin Cellar [facility] Pager [phone]

## 2015-10-24 NOTE — ED Notes (Addendum)
Per family member-states cough for 2 days-history of COPD-went to PCP and they sent him here-states he was discharged from [facility] yesterday

## 2015-10-24 NOTE — Progress Notes (Signed)
Pharmacy Antibiotic Note  [name] is a 82 y.o. male discharged yesterday from [facility] for ileus and pseudo bowel obstruction admitted on 10/24/2015 with increasing cough, fatigue, and low-grade fevers. CXR shows posterior LL airspace disease.  Pharmacy has been consulted for Vancomycin and Aztreonam dosing for HCAP.   -Noted allergies to pencillins (rash, facial/tongue/throat swelling or lightheadedness with hypotension) and sulfas (nightmares, hallucinations).   Plan: Aztreonam 1g IV q8h Vancomycin 1g IV q24h F/u VT at [facility] as warranted, renal fxn, cultures, clinical course    Temp (24hrs), Avg:97.8 F (36.6 C), Min:97.8 F (36.6 C), Max:97.8 F (36.6 C)   Recent Labs Lab 10/17/15 2340 10/18/15 0811 10/19/15 0607 10/22/15 0539 10/24/15 1406  WBC  --  7.1 6.7 5.4  --   CREATININE 1.28* 1.28* 1.39* 1.18  --   LATICACIDVEN  --   --   --   --  1.52    Estimated Creatinine Clearance: 46.6 mL/min (by C-G formula based on Cr of 1.18).    Allergies  Allergen Reactions  . Clopidogrel Bisulfate Hives    REACTION: red blue, black blotches  . Dilaudid [Hydromorphone Hcl] Hives    Hallucinations, little green men working on the building  . Penicillins Hives    Has patient had a PCN reaction causing immediate rash, facial/tongue/throat swelling, SOB or lightheadedness with hypotension: Yes Has patient had a PCN reaction causing severe rash involving mucus membranes or skin necrosis: No Has patient had a PCN reaction that required hospitalization No Has patient had a PCN reaction occurring within the last 10 years: No If all of the above answers are "NO", then may proceed with Cephalosporin use.   . Sulfa Antibiotics Other (See Comments)    Nightmares, hallucinations  . Sulfonamide Derivatives Other (See Comments)    Nightmares, hallucinations  . Doxycycline Hyclate Rash    Antimicrobials this admission: 3/12 Vancomycin >>  3/12 Aztreonam >>   Dose adjustments this  admission:   Microbiology results: 3/12 Influenza PCR: IP  Thank you for allowing pharmacy to be a part of this patient's care.  Ralene Bathe, PharmD, BCPS 10/24/2015, 2:36 PM  Pager: [phone]

## 2015-10-24 NOTE — ED Provider Notes (Signed)
CSN: [id number]     Arrival date & time 10/24/15  1301 History   First MD Initiated Contact with Patient 10/24/15 1316     Chief Complaint  Patient presents with  . Cough     (Consider location/radiation/quality/duration/timing/severity/associated sxs/prior Treatment) HPI\  Patient is an 82 year old male with history of COPD, stroke, prostate cancer, CAD, anemia, deafness presenting today with cough and shortness of breath. Patient had recent 8 day hospital stay for ileus and pseudo-bowel obstruction. Patient returned him recently and has since then developed increasing cough, fatigue, low-grade fevers.  No urinary or bowel symptoms. Patient short of breath at rest.  Past Medical History  Diagnosis Date  . Carotid artery stenosis   . Essential hypertension   . Myocardial infarction ([place]) 1979  . Coronary artery disease     Known occluded proximal LAD with collaterals, moderate RCA disease - managed medically 2009  . COPD (chronic obstructive pulmonary disease) ([place])   . History of stroke   . Prostate cancer ([place])   . Arthritis   . Foot drop, right   . Neuropathy ([place])   . Anemia   . Urinary incontinence, male, stress   . GERD (gastroesophageal reflux disease)   . Thalassemia   . Wears dentures   . Deafness in left ear     Wears hearing aide in right ear  . Sleep apnea     CPAP  . Ischemic cardiomyopathy     LVEF 40-45%  . Hypercholesteremia   . Anemia   . Osteoarthritis   . Stroke ([place])   . Heart attack ([place])   . Vertigo   . Hereditary and idiopathic peripheral neuropathy 03/01/2015  . Abnormality of gait 03/01/2015   Past Surgical History  Procedure Laterality Date  . Penile prosthesis implant    . Cholecystectomy    . Right carotid endarterectomy    . Back surgery    . Lumbar laminectomy  03/24/10    L3-4 and L4-5  . Ankle surgery      Bone infection  . Cardiac catheterization    . Appendectomy    . Joint replacement Left   . Radioactive seed implant  2009   . Pars plana vitrectomy  03/19/2012    Procedure: PARS PLANA VITRECTOMY WITH 25 GAUGE;  Surgeon: Hayden Pedro, MD;  Location: [place];  Service: Ophthalmology;  Laterality: Right;  . Carotid endarterectomy Right     CEA - 15 yrs ago  . Spine surgery    . Eye surgery    . Cataract extraction w/ intraocular lens implant Left   . Endarterectomy Left 09/28/2014    Procedure: Left Carotid Endarterectomy;  Surgeon: Rosetta Posner, MD;  Location: [place];  Service: Vascular;  Laterality: Left;  . Patch angioplasty Left 09/28/2014    Procedure: WITH DACRON PATCH ANGIOPLASTY;  Surgeon: Rosetta Posner, MD;  Location: [facility] OR;  Service: Vascular;  Laterality: Left;   Family History  Problem Relation Age of Onset  . Thalassemia      Family history  . Cancer Mother 78    Bone cancer  . Diabetes Mother   . Heart disease Mother   . Heart attack Mother   . Hypertension Mother   . Stroke Father 62  . Heart disease Father   . Hypertension Father   . Hypertension Sister   . Diabetes Sister   . Heart disease Brother   . Heart attack Brother 44  . Cancer Brother    Social  History  Substance Use Topics  . Smoking status: Former Smoker -- 1.00 packs/day for 20 years    Types: Cigarettes    Quit date: 08/14/1977  . Smokeless tobacco: Never Used  . Alcohol Use: No     Comment: occasional    Review of Systems  Constitutional: Positive for fever and fatigue. Negative for activity change.  HENT: Positive for congestion.   Respiratory: Positive for cough and shortness of breath.   Gastrointestinal: Negative for abdominal pain.  Genitourinary: Negative for dysuria and urgency.  Musculoskeletal: Positive for myalgias. Negative for arthralgias.  Psychiatric/Behavioral: Negative for agitation.  All other systems reviewed and are negative.     Allergies  Amoxicillin; Clopidogrel bisulfate; Dilaudid; Doxycycline hyclate; Penicillins; Sulfa antibiotics; Sulfamethoxazole; and Sulfonamide derivatives  Home  Medications   Prior to Admission medications   Medication Sig Start Date End Date Taking? Authorizing Provider  acetaminophen (TYLENOL) 325 MG tablet Take 650 mg by mouth every 6 (six) hours as needed for mild pain.     Historical Provider, MD  aspirin 81 MG tablet Take 81 mg by mouth daily.    Historical Provider, MD  carvedilol (COREG) 12.5 MG tablet TAKE 1 TABLET TWICE DAILY WITH A MEAL 06/08/15   Dorothy Spark, MD  furosemide (LASIX) 20 MG tablet Take 40 mg every morning 10/23/15   Annita Brod, MD  gemfibrozil (LOPID) 600 MG tablet Take 600 mg by mouth 2 (two) times daily before a meal. Reported on 09/20/2015    Historical Provider, MD  Glycopyrrolate-Formoterol (BEVESPI AEROSPHERE) 9-4.8 MCG/ACT AERO Inhale 2 puffs into the lungs every 12 (twelve) hours. 07/19/15   Tanda Rockers, MD  LINZESS 145 MCG CAPS capsule Take 1 capsule by mouth daily with breakfast. 06/29/15   Historical Provider, MD  losartan (COZAAR) 25 MG tablet Take 1 tablet (25 mg total) by mouth daily. 01/14/15   Marijean Heath, NP  meclizine (ANTIVERT) 25 MG tablet Take 1 tablet (25 mg total) by mouth daily as needed for dizziness. 01/29/15   Dorothy Spark, MD  mometasone-formoterol (DULERA) 200-5 MCG/ACT AERO Inhale 2 puffs into the lungs 2 (two) times daily. 10/01/15   Tanda Rockers, MD  MYRBETRIQ 50 MG TB24 tablet Take 50 mg by mouth daily. 06/29/15   Historical Provider, MD  NITROSTAT 0.4 MG SL tablet PLACE 1 TABLET (0.4 MG TOTAL) UNDER THE TONGUE EVERY 5 (FIVE) MINUTES AS NEEDED. FOR CHEST PAIN 04/08/15   Dorothy Spark, MD  omeprazole (PRILOSEC) 40 MG capsule Take 1 capsule (40 mg total) by mouth daily. 05/21/15   Dorothy Spark, MD  polyethylene glycol powder (MIRALAX) powder Take 17 g by mouth daily as needed for mild constipation.     Historical Provider, MD  pregabalin (LYRICA) 75 MG capsule Take 75 mg by mouth 2 (two) times daily.     Historical Provider, MD  SYMBICORT 160-4.5 MCG/ACT inhaler INHALE  2 PUFFS FIRST THING IN THE MORNING AND THEN TAKE 2 PUFFS 12 HOURS LATER 07/23/15   Tanda Rockers, MD  tiotropium (SPIRIVA) 18 MCG inhalation capsule Place 1 capsule (18 mcg total) into inhaler and inhale daily. 09/23/15   Maryann Mikhail, DO  VENTOLIN HFA 108 (90 BASE) MCG/ACT inhaler INHALE 2 PUFFS INTO THE LUNGS EVERY 4 HOURS AS NEEDED FOR WHEEZING OR SHORTNESS OF BREATH Patient taking differently: INHALE 2 PUFFS INTO THE LUNGS daily. 07/12/15   Tanda Rockers, MD  vitamin C (ASCORBIC ACID) 500 MG tablet Take 500 mg  by mouth daily.    Historical Provider, MD   BP 99/51 mmHg  Pulse 71  Temp(Src) 97.8 F (36.6 C) (Oral)  Resp 16  SpO2 96% Physical Exam  Constitutional: He appears well-nourished.  HENT:  Head: Normocephalic.  Mouth/Throat: Oropharynx is clear and moist.  Dry mucous membranes  Eyes: Conjunctivae are normal.  Neck: No tracheal deviation present.  Cardiovascular: Normal rate.   Pulmonary/Chest: No stridor. No respiratory distress. He has wheezes. He has rales.  Mildly increased work of breathing.  Abdominal: Soft. There is no tenderness. There is no guarding.  Musculoskeletal: Normal range of motion. He exhibits no edema.  Neurological: No cranial nerve deficit.  Skin: Skin is warm and dry. No rash noted. He is not diaphoretic.  Nursing note and vitals reviewed.   ED Course  Procedures (including critical care time) Labs Review Labs Reviewed  INFLUENZA PANEL BY PCR (TYPE A & B, H1N1)  CBC WITH DIFFERENTIAL/PLATELET  COMPREHENSIVE METABOLIC PANEL  I-STAT CG4 LACTIC ACID, ED    Imaging Review Dg Chest 2 View  10/24/2015  CLINICAL DATA:  Productive cough for 2-3 days. EXAM: CHEST - 2 VIEW COMPARISON:  Two-view chest x-ray 09/20/2015. FINDINGS: The heart size is normal. Small bilateral pleural effusions are now present. Mild bibasilar airspace opacities are evident posteriorly. The upper lung fields are clear. Emphysematous changes are again noted. Atherosclerotic  calcifications are present in the aortic arch. Surgical clips are present in the right neck and at the gallbladder fossa. IMPRESSION: 1. New bilateral pleural effusions and posterior lower lobe airspace disease. While this may represent atelectasis, infection is not excluded. 2. Emphysema. 3. Atherosclerosis of the thoracic aorta. Electronically Signed   By: San Morelle M.D.   On: 10/24/2015 13:38   I have personally reviewed and evaluated these images and lab results as part of my medical decision-making.   EKG Interpretation None      MDM   Final diagnoses:  None  Patient is a pleasant 82 year old male with history of COPD presenting today for cough. Patient has low blood pressure on arrival. Patient had recent eight-day long hospital stay. X-ray shows likely pneumonia. We'll treat for HCAP. We'll also give steroids given her degree of airway inflammation from COPD. We'll order DuoNeb. Will give 1 L fluid, lactic pending.    Courteney Julio Alm, MD 10/24/15 1553

## 2015-10-25 ENCOUNTER — Inpatient Hospital Stay (HOSPITAL_COMMUNITY): Payer: Medicare Other

## 2015-10-25 LAB — CBC
HEMATOCRIT: 24.6 % — AB (ref 39.0–52.0)
Hemoglobin: 7.4 g/dL — ABNORMAL LOW (ref 13.0–17.0)
MCH: 19.1 pg — ABNORMAL LOW (ref 26.0–34.0)
MCHC: 30.1 g/dL (ref 30.0–36.0)
MCV: 63.4 fL — ABNORMAL LOW (ref 78.0–100.0)
Platelets: 187 10*3/uL (ref 150–400)
RBC: 3.88 MIL/uL — ABNORMAL LOW (ref 4.22–5.81)
RDW: 17.2 % — AB (ref 11.5–15.5)
WBC: 4.6 10*3/uL (ref 4.0–10.5)

## 2015-10-25 LAB — BASIC METABOLIC PANEL
Anion gap: 9 (ref 5–15)
BUN: 22 mg/dL — AB (ref 6–20)
CHLORIDE: 110 mmol/L (ref 101–111)
CO2: 21 mmol/L — ABNORMAL LOW (ref 22–32)
Calcium: 8.1 mg/dL — ABNORMAL LOW (ref 8.9–10.3)
Creatinine, Ser: 1.37 mg/dL — ABNORMAL HIGH (ref 0.61–1.24)
GFR calc Af Amer: 51 mL/min — ABNORMAL LOW (ref >60)
GFR calc non Af Amer: 44 mL/min — ABNORMAL LOW (ref >60)
GLUCOSE: 201 mg/dL — AB (ref 65–99)
POTASSIUM: 4.2 mmol/L (ref 3.5–5.1)
Sodium: 140 mmol/L (ref 135–145)

## 2015-10-25 MED ORDER — IPRATROPIUM-ALBUTEROL 0.5-2.5 (3) MG/3ML IN SOLN
3.0000 mL | RESPIRATORY_TRACT | Status: DC | PRN
  Administered 2015-10-25 – 2015-10-27 (×3): 3 mL via RESPIRATORY_TRACT
  Filled 2015-10-25 (×3): qty 3

## 2015-10-25 MED ORDER — MORPHINE SULFATE (PF) 2 MG/ML IV SOLN
2.0000 mg | Freq: Once | INTRAVENOUS | Status: AC
  Administered 2015-10-25: 2 mg via INTRAVENOUS
  Filled 2015-10-25: qty 1

## 2015-10-25 MED ORDER — SODIUM CHLORIDE 0.9 % IV SOLN
INTRAVENOUS | Status: DC
  Administered 2015-10-25 (×2): via INTRAVENOUS

## 2015-10-25 NOTE — Progress Notes (Signed)
PT demonstrates verbal and hands on understanding of Flutter device. 

## 2015-10-25 NOTE — Progress Notes (Signed)
Patient ID: [name], male   DOB: 10-14-1923, 82 y.o.   MRN: [id number] [facility] PROGRESS NOTE  CHANZ NEISWONGER [id number] DOB: February 05, 1924 DOA: 10/24/2015 PCP:  Melinda Crutch, MD  Brief narrative:    82 y.o. malewith a past medical history of chronic obstructive pulmonary disease,stage III, hypertension, recent admission from 10/14/2015 through 10/23/2015 treated for small bowel obstruction. Patient presented to [facility] 10/24/2015 with worsening shortness of breath associated with cough productive of greenish and yellowish sputum, wheezing and generalized weakness. Chest x-ray in ED showed lower lobe airspace disease concerning for pneumonia. Considering his recent admission he was admitted for management of healthcare associated pneumonia and started on vancomycin and aztreonam while awaiting culture results.   Assessment/Plan:    Principal problem: Acute respiratory failure with hypoxia / HCAP / lobar pneumonia,  unspecified organism - Hypoxia on the admission likely secondary to pneumonia as well as COPD exacerbation. Patient is saturating 94% with nasal cannula oxygen support - He is still rhonchorous and wheezing on physical exam - We will continue current nebulizer duo neb every 6 hours scheduled in addition to every 2 hours as needed for shortness of breath or wheezing - Continue Solu-Medrol 40 mg IV every 12 hours - Repeat chest x-ray this morning to follow-up on progression of pneumonia  Active Problems: Acute COPD exacerbation - As noted above we will continue oxygen support via nasal cannula to keep oxygen saturation above 90% - Continue DuoNeb 4 times a day scheduled along with DuoNeb for shortness of breath or wheezing every 2 hours as needed - Continued Solu-Medrol 40 mg IV every 12 hours  Chronic kidney disease, stage III - Creatinine stable at 1.37  Essential hypertension - Continue carvedilol 12.5 mg by mouth twice a day  Dyslipidemia - Continue  simvastatin 10 mg daily at bedtime   DVT Prophylaxis  - Lovenox subcutaneous ordered   Code Status: Full.  Family Communication:  plan of care discussed with the patient Disposition Plan: Home likely by 10/27/2015.  IV access:  Peripheral IV  Procedures and diagnostic studies:     Dg Chest 2 View 10/25/2015   Small bilateral pleural effusions. Improved aeration in the bases is noted.   Dg Chest 2 View 10/24/2015   1. New bilateral pleural effusions and posterior lower lobe airspace disease. While this may represent atelectasis, infection is not excluded. 2. Emphysema. 3. Atherosclerosis of the thoracic aorta.   Medical Consultants:  None   IAnti-Infectives:   Vancomycin and aztreonam 10/24/2015 -->   Leisa Lenz, MD  [facility] Pager [phone]  Time spent in minutes: 25 minutes  If 7PM-7AM, please contact night-coverage [email or web address] Password [email or web address] 10/25/2015, 12:25 PM   LOS: 1 day    HPI/Subjective: No acute overnight events. Patient reports feeling short of breath with exertion.  Objective: Filed Vitals:   10/24/15 1954 10/25/15 0004 10/25/15 0559 10/25/15 1146  BP:  107/45 129/58   Pulse:  75 84   Temp:  98.1 F (36.7 C) 98.1 F (36.7 C)   TempSrc:  Oral Oral   Resp:  19 18   Height:      Weight:      SpO2: 94% 96% 96% 95%    Intake/Output Summary (Last 24 hours) at 10/25/15 1225 Last data filed at 10/25/15 0900  Gross per 24 hour  Intake 539.17 ml  Output    800 ml  Net -260.83 ml    Exam:   General:  Pt is alert, follows  commands appropriately, not in acute distress  Cardiovascular: Regular rate and rhythm, S1/S2 appreciated   Respiratory: rhochorous bilaterally, wheezing in upper lung lobes   Abdomen: Soft, non tender, non distended, bowel sounds present  Extremities: No edema, pulses palpable bilaterally  Neuro: Grossly nonfocal  Data Reviewed: Basic Metabolic Panel:  Recent Labs Lab 10/19/15 0607 10/22/15 0539  10/24/15 1358 10/25/15 0439  NA 140 140 139 140  K 4.0 4.5 4.5 4.2  CL 112* 109 108 110  CO2 20* 22 22 21*  GLUCOSE 111* 106* 113* 201*  BUN 17 14 18  22*  CREATININE 1.39* 1.18 1.38* 1.37*  CALCIUM 8.4* 8.5* 8.5* 8.1*   Liver Function Tests:  Recent Labs Lab 10/24/15 1358  AST 18  ALT 12*  ALKPHOS 56  BILITOT 0.8  PROT 6.1*  ALBUMIN 3.1*   No results for input(s): LIPASE, AMYLASE in the last 168 hours. No results for input(s): AMMONIA in the last 168 hours. CBC:  Recent Labs Lab 10/19/15 0607 10/22/15 0539 10/24/15 1358 10/25/15 0439  WBC 6.7 5.4 7.4 4.6  NEUTROABS  --   --  5.6  --   HGB 7.8* 7.5* 7.9* 7.4*  HCT 24.8* 25.1* 25.5* 24.6*  MCV 61.7* 61.8* 61.2* 63.4*  PLT 148* 153 186 187   Cardiac Enzymes: No results for input(s): CKTOTAL, CKMB, CKMBINDEX, TROPONINI in the last 168 hours. BNP: Invalid input(s): POCBNP CBG: No results for input(s): GLUCAP in the last 168 hours.  Recent Results (from the past 240 hour(s))  Gastrointestinal Panel by PCR , Stool     Status: None   Collection Time: 10/16/15  5:12 PM  Result Value Ref Range Status   Campylobacter species NOT DETECTED NOT DETECTED Final   Plesimonas shigelloides NOT DETECTED NOT DETECTED Final   Salmonella species NOT DETECTED NOT DETECTED Final   Yersinia enterocolitica NOT DETECTED NOT DETECTED Final   Vibrio species NOT DETECTED NOT DETECTED Final   Vibrio cholerae NOT DETECTED NOT DETECTED Final   Enteroaggregative E coli (EAEC) NOT DETECTED NOT DETECTED Final   Enteropathogenic E coli (EPEC) NOT DETECTED NOT DETECTED Final   Enterotoxigenic E coli (ETEC) NOT DETECTED NOT DETECTED Final   Shiga like toxin producing E coli (STEC) NOT DETECTED NOT DETECTED Final   E. coli O157 NOT DETECTED NOT DETECTED Final   Shigella/Enteroinvasive E coli (EIEC) NOT DETECTED NOT DETECTED Final   Cryptosporidium NOT DETECTED NOT DETECTED Final   Cyclospora cayetanensis NOT DETECTED NOT DETECTED Final    Entamoeba histolytica NOT DETECTED NOT DETECTED Final   Giardia lamblia NOT DETECTED NOT DETECTED Final   Adenovirus F40/41 NOT DETECTED NOT DETECTED Final   Astrovirus NOT DETECTED NOT DETECTED Final   Norovirus GI/GII NOT DETECTED NOT DETECTED Final   Rotavirus A NOT DETECTED NOT DETECTED Final   Sapovirus (I, II, IV, and V) NOT DETECTED NOT DETECTED Final  C difficile quick scan w PCR reflex     Status: None   Collection Time: 10/16/15  5:12 PM  Result Value Ref Range Status   C Diff antigen NEGATIVE NEGATIVE Final   C Diff toxin NEGATIVE NEGATIVE Final   C Diff interpretation Negative for toxigenic C. difficile  Final     Scheduled Meds: . aspirin EC  81 mg Oral Daily  . aztreonam  1 g Intravenous 3 times per day  . carvedilol  12.5 mg Oral BID WC  . enoxaparin (LOVENOX)   40 mg Subcutaneous Q24H  . ipratropium-albuterol  3 mL Nebulization  QID  . methylPREDNISolone   40 mg Intravenous Q12H  . mometasone-formoterol  2 puff Inhalation BID  . pregabalin  75 mg Oral BID  . simvastatin  10 mg Oral q1800  . vancomycin  1,000 mg Intravenous Q24H

## 2015-10-25 NOTE — Progress Notes (Signed)
Patient in respiratory distress. RN paged physician.

## 2015-10-25 NOTE — Progress Notes (Signed)
Patient has low urine output this shift, bladder scan was done 160ml. Will continue to monitor and endorsed accordingly.

## 2015-10-26 ENCOUNTER — Inpatient Hospital Stay (HOSPITAL_COMMUNITY): Payer: Medicare Other

## 2015-10-26 ENCOUNTER — Encounter (HOSPITAL_COMMUNITY): Payer: Medicare Other

## 2015-10-26 ENCOUNTER — Ambulatory Visit: Payer: Medicare Other | Admitting: Family

## 2015-10-26 DIAGNOSIS — I9589 Other hypotension: Secondary | ICD-10-CM

## 2015-10-26 DIAGNOSIS — I5021 Acute systolic (congestive) heart failure: Secondary | ICD-10-CM

## 2015-10-26 DIAGNOSIS — J189 Pneumonia, unspecified organism: Secondary | ICD-10-CM

## 2015-10-26 DIAGNOSIS — J9622 Acute and chronic respiratory failure with hypercapnia: Secondary | ICD-10-CM

## 2015-10-26 DIAGNOSIS — J449 Chronic obstructive pulmonary disease, unspecified: Secondary | ICD-10-CM

## 2015-10-26 LAB — BLOOD GAS, ARTERIAL
Acid-base deficit: 4.9 mmol/L — ABNORMAL HIGH (ref 0.0–2.0)
Acid-base deficit: 8.9 mmol/L — ABNORMAL HIGH (ref 0.0–2.0)
Acid-base deficit: 9.9 mmol/L — ABNORMAL HIGH (ref 0.0–2.0)
BICARBONATE: 20.2 meq/L (ref 20.0–24.0)
Bicarbonate: 19.5 mEq/L — ABNORMAL LOW (ref 20.0–24.0)
Bicarbonate: 21.2 mEq/L (ref 20.0–24.0)
DRAWN BY: 235321
DRAWN BY: 39899
Delivery systems: POSITIVE
Drawn by: 235321
Expiratory PAP: 5
FIO2: 0.3
FIO2: 1
FIO2: 1
Inspiratory PAP: 12
Mode: POSITIVE
Mode: POSITIVE
O2 SAT: 92.6 %
O2 Saturation: 98.2 %
O2 Saturation: 99.2 %
PATIENT TEMPERATURE: 98.6
PCO2 ART: 40.1 mmHg (ref 35.0–45.0)
PEEP/CPAP: 5 cmH2O
PEEP: 5 cmH2O
PO2 ART: 70.9 mmHg — AB (ref 80.0–100.0)
Patient temperature: 98.6
Patient temperature: 98.6
Pressure support: 5 cmH2O
RATE: 18 resp/min
TCO2: 19.3 mmol/L (ref 0–100)
TCO2: 19.3 mmol/L (ref 0–100)
TCO2: 21.4 mmol/L (ref 0–100)
VT: 620 mL
pCO2 arterial: 57.9 mmHg (ref 35.0–45.0)
pCO2 arterial: 78.9 mmHg (ref 35.0–45.0)
pH, Arterial: 7.059 — CL (ref 7.350–7.450)
pH, Arterial: 7.153 — CL (ref 7.350–7.450)
pH, Arterial: 7.323 — ABNORMAL LOW (ref 7.350–7.450)
pO2, Arterial: 225 mmHg — ABNORMAL HIGH (ref 80.0–100.0)
pO2, Arterial: 386 mmHg — ABNORMAL HIGH (ref 80.0–100.0)

## 2015-10-26 LAB — CBC
HEMATOCRIT: 32 % — AB (ref 39.0–52.0)
HEMOGLOBIN: 9.4 g/dL — AB (ref 13.0–17.0)
MCH: 19 pg — AB (ref 26.0–34.0)
MCHC: 29.4 g/dL — ABNORMAL LOW (ref 30.0–36.0)
MCV: 64.5 fL — AB (ref 78.0–100.0)
PLATELETS: 262 10*3/uL (ref 150–400)
RBC: 4.96 MIL/uL (ref 4.22–5.81)
RDW: 17.4 % — ABNORMAL HIGH (ref 11.5–15.5)
WBC: 15.4 10*3/uL — AB (ref 4.0–10.5)

## 2015-10-26 LAB — BASIC METABOLIC PANEL
ANION GAP: 8 (ref 5–15)
BUN: 26 mg/dL — ABNORMAL HIGH (ref 6–20)
CHLORIDE: 111 mmol/L (ref 101–111)
CO2: 21 mmol/L — ABNORMAL LOW (ref 22–32)
Calcium: 8.2 mg/dL — ABNORMAL LOW (ref 8.9–10.3)
Creatinine, Ser: 1.55 mg/dL — ABNORMAL HIGH (ref 0.61–1.24)
GFR calc Af Amer: 44 mL/min — ABNORMAL LOW (ref >60)
GFR, EST NON AFRICAN AMERICAN: 38 mL/min — AB (ref >60)
GLUCOSE: 270 mg/dL — AB (ref 65–99)
POTASSIUM: 4.8 mmol/L (ref 3.5–5.1)
Sodium: 140 mmol/L (ref 135–145)

## 2015-10-26 LAB — ECHOCARDIOGRAM COMPLETE
Height: 72 in
WEIGHTICAEL: 2832 [oz_av]

## 2015-10-26 LAB — MRSA PCR SCREENING: MRSA BY PCR: NEGATIVE

## 2015-10-26 LAB — GLUCOSE, CAPILLARY
GLUCOSE-CAPILLARY: 221 mg/dL — AB (ref 65–99)
GLUCOSE-CAPILLARY: 193 mg/dL — AB (ref 65–99)
GLUCOSE-CAPILLARY: 131 mg/dL — AB (ref 65–99)
GLUCOSE-CAPILLARY: 120 mg/dL — AB (ref 65–99)
Glucose-Capillary: 141 mg/dL — ABNORMAL HIGH (ref 65–99)

## 2015-10-26 LAB — TROPONIN I: TROPONIN I: 0.06 ng/mL — AB (ref <0.031)

## 2015-10-26 LAB — PROCALCITONIN: Procalcitonin: <0.1 ng/mL

## 2015-10-26 LAB — BRAIN NATRIURETIC PEPTIDE: B Natriuretic Peptide: 631.1 pg/mL — ABNORMAL HIGH (ref 0.0–100.0)

## 2015-10-26 MED ORDER — PERFLUTREN LIPID MICROSPHERE
1.0000 mL | INTRAVENOUS | Status: AC | PRN
  Administered 2015-10-26: 3 mL via INTRAVENOUS
  Filled 2015-10-26: qty 10

## 2015-10-26 MED ORDER — MIDAZOLAM HCL 2 MG/2ML IJ SOLN
1.0000 mg | INTRAMUSCULAR | Status: DC | PRN
  Filled 2015-10-26: qty 2

## 2015-10-26 MED ORDER — MIDAZOLAM HCL 2 MG/2ML IJ SOLN: 1.0000 mg | INTRAMUSCULAR | Status: DC | PRN

## 2015-10-26 MED ORDER — FENTANYL CITRATE (PF) 100 MCG/2ML IJ SOLN
50.0000 ug | INTRAMUSCULAR | Status: AC | PRN
  Administered 2015-10-26 (×3): 50 ug via INTRAVENOUS
  Filled 2015-10-26: qty 2

## 2015-10-26 MED ORDER — ASPIRIN 81 MG PO CHEW
81.0000 mg | CHEWABLE_TABLET | Freq: Every day | ORAL | Status: DC
  Administered 2015-10-27 – 2015-11-02 (×7): 81 mg
  Filled 2015-10-26 (×9): qty 1

## 2015-10-26 MED ORDER — FUROSEMIDE 10 MG/ML IJ SOLN
INTRAMUSCULAR | Status: AC
  Administered 2015-10-26: 40 mg
  Filled 2015-10-26: qty 4

## 2015-10-26 MED ORDER — FENTANYL CITRATE (PF) 100 MCG/2ML IJ SOLN
INTRAMUSCULAR | Status: AC
  Administered 2015-10-26: 100 ug
  Filled 2015-10-26: qty 4

## 2015-10-26 MED ORDER — PANTOPRAZOLE SODIUM 40 MG IV SOLR
40.0000 mg | Freq: Every day | INTRAVENOUS | Status: DC
  Administered 2015-10-26 – 2015-10-28 (×3): 40 mg via INTRAVENOUS
  Filled 2015-10-26 (×4): qty 40

## 2015-10-26 MED ORDER — FENTANYL CITRATE (PF) 100 MCG/2ML IJ SOLN
50.0000 ug | INTRAMUSCULAR | Status: DC | PRN
  Administered 2015-10-26 – 2015-10-27 (×2): 50 ug via INTRAVENOUS
  Filled 2015-10-26 (×3): qty 2

## 2015-10-26 MED ORDER — IPRATROPIUM-ALBUTEROL 0.5-2.5 (3) MG/3ML IN SOLN
3.0000 mL | RESPIRATORY_TRACT | Status: DC
Start: 2015-10-26 — End: 2015-10-29
  Administered 2015-10-26 – 2015-10-29 (×21): 3 mL via RESPIRATORY_TRACT
  Filled 2015-10-26 (×21): qty 3

## 2015-10-26 MED ORDER — FENTANYL BOLUS VIA INFUSION
25.0000 ug | INTRAVENOUS | Status: DC | PRN
Start: 2015-10-26 — End: 2015-10-27
  Filled 2015-10-26: qty 25

## 2015-10-26 MED ORDER — PHENYLEPHRINE HCL 10 MG/ML IJ SOLN
30.0000 ug/min | INTRAVENOUS | Status: DC
  Administered 2015-10-26: 30 ug/min via INTRAVENOUS
  Administered 2015-10-26: 50 ug/min via INTRAVENOUS
  Administered 2015-10-26: 30 ug/min via INTRAVENOUS
  Administered 2015-10-27: 25 ug/min via INTRAVENOUS
  Filled 2015-10-26 (×3): qty 1

## 2015-10-26 MED ORDER — INSULIN ASPART 100 UNIT/ML ~~LOC~~ SOLN
2.0000 [IU] | SUBCUTANEOUS | Status: DC
  Administered 2015-10-26: 2 [IU] via SUBCUTANEOUS
  Administered 2015-10-26: 4 [IU] via SUBCUTANEOUS
  Administered 2015-10-26: 6 [IU] via SUBCUTANEOUS
  Administered 2015-10-26: 2 [IU] via SUBCUTANEOUS
  Administered 2015-10-27: 4 [IU] via SUBCUTANEOUS
  Administered 2015-10-27 (×3): 2 [IU] via SUBCUTANEOUS
  Administered 2015-10-27: 4 [IU] via SUBCUTANEOUS
  Administered 2015-10-28 – 2015-10-29 (×4): 2 [IU] via SUBCUTANEOUS
  Administered 2015-10-29 (×2): 4 [IU] via SUBCUTANEOUS
  Administered 2015-10-29 (×3): 2 [IU] via SUBCUTANEOUS

## 2015-10-26 MED ORDER — PREGABALIN 75 MG PO CAPS
75.0000 mg | ORAL_CAPSULE | Freq: Two times a day (BID) | ORAL | Status: DC
Start: 2015-10-26 — End: 2015-11-02
  Administered 2015-10-26 – 2015-11-02 (×14): 75 mg
  Filled 2015-10-26 (×14): qty 1

## 2015-10-26 MED ORDER — SIMVASTATIN 10 MG PO TABS
10.0000 mg | ORAL_TABLET | Freq: Every day | ORAL | Status: DC
Start: 2015-10-26 — End: 2015-11-02
  Administered 2015-10-26 – 2015-11-01 (×7): 10 mg
  Filled 2015-10-26 (×7): qty 1

## 2015-10-26 MED ORDER — MIDAZOLAM HCL 2 MG/2ML IJ SOLN
INTRAMUSCULAR | Status: AC
  Administered 2015-10-26: 2 mg
  Filled 2015-10-26: qty 4

## 2015-10-26 MED ORDER — ASPIRIN 81 MG PO CHEW
81.0000 mg | CHEWABLE_TABLET | Freq: Every day | ORAL | Status: DC
  Administered 2015-10-26: 81 mg via ORAL
  Filled 2015-10-26: qty 1

## 2015-10-26 MED ORDER — SODIUM CHLORIDE 0.9 % IV SOLN
25.0000 ug/h | INTRAVENOUS | Status: DC
  Administered 2015-10-26: 50 ug/h via INTRAVENOUS
  Filled 2015-10-26: qty 50

## 2015-10-26 MED ORDER — FENTANYL CITRATE (PF) 100 MCG/2ML IJ SOLN
50.0000 ug | Freq: Once | INTRAMUSCULAR | Status: AC
  Administered 2015-10-26: 50 ug via INTRAVENOUS

## 2015-10-26 NOTE — Care Management Note (Signed)
Case Management Note  Patient Details  Name: PARSA CARRETE MRN: [id number] Date of Birth: Dec 25, 1923  Subjective/Objective: 82 y/o m admitted w/HCAP.Readmit-SBO. From home. Active w/[facility]. Restraints. Would recc PT cons when appropriate.                   Action/Plan:d/c plan likely SNF.   Expected Discharge Date:                  Expected Discharge Plan:  [place]  In-House Referral:  Clinical Social Work  Discharge planning Services  CM Consult  Post Acute Care Choice:  Home Health (Active w/[facility]) Choice offered to:     DME Arranged:    DME Agency:     HH Arranged:    HH Agency:     Status of Service:  In process, will continue to follow  Medicare Important Message Given:    Date Medicare IM Given:    Medicare IM give by:    Date Additional Medicare IM Given:    Additional Medicare Important Message give by:     If discussed at [place] of Stay Meetings, dates discussed:    Additional Comments:  Dessa Phi, RN 10/26/2015, 1:33 PM

## 2015-10-26 NOTE — Procedures (Signed)
Intubation Procedure Note [facility] LEMMEN [id number] 1924-01-26  Procedure: Intubation Indications: Respiratory insufficiency  Procedure Details Consent: Risks of procedure as well as the alternatives and risks of each were explained to the (patient/caregiver).  Consent for procedure obtained. Time Out: Verified patient identification, verified procedure, site/side was marked, verified correct patient position, special equipment/implants available, medications/allergies/relevent history reviewed, required imaging and test results available.  Performed  Maximum sterile technique was used including gloves, gown and mask.  MAC and 3  fent 100 mcg Versed 2 Etomidate 20  Evaluation Hemodynamic Status: BP stable throughout; O2 sats: stable throughout Patient's Current Condition: stable Complications: No apparent complications Patient did tolerate procedure well. Chest X-ray ordered to verify placement.  CXR: pending.   ALVA,RAKESH V. 10/26/2015

## 2015-10-26 NOTE — Progress Notes (Signed)
Daughter notified of restraints, she stated she was expecting that and will be by to see him later today.

## 2015-10-26 NOTE — Progress Notes (Signed)
eLink Physician-Brief Progress Note Patient Name: [name] DOB: 12-05-1923 MRN: [id number]   Date of Service  10/26/2015  HPI/Events of Note  Brought from floor with severe respiratory failure copd Full code Discussed with [facility] who request consultation ABG: 7.01/79/90  eICU Interventions  Called on ground to team to assess for intubation     Intervention Category Evaluation Type: New Patient Evaluation  Simonne Maffucci 10/26/2015, 12:51 AM

## 2015-10-26 NOTE — Progress Notes (Signed)
PT Cancellation Note  Patient Details Name: [name] MRN: [id number] DOB: May 30, 1924   Cancelled Treatment:    Reason Eval/Treat Not Completed: Medical issues which prohibited therapy (rapid decline requiring intubation. )   Marcelino Freestone PT [id number]  10/26/2015, 7:48 AM

## 2015-10-26 NOTE — Progress Notes (Signed)
Shift event note:  Notified by RN regarding sudden change in pt status. RN reports pt began to report that he could not breathe and was noted w/ significantly increased WOB . Stat PCXR ordered and assessed pt at bedside. On arrival to bedside pt noted w/ marked increased WOB and + accessory muscle use. 02 sats remain WNL 94-96% on 4L [place]. BBS very diminished  at the bases w/ some scattered rhonchi and copious "gurgling" type upper airway secretions. Pt only able to speak one to words at a time, however he remained awake and alert and able to follow commands. Pt increasingly more restless. Pt was given Morphine 2 mg IV,  placed on BiPAP at bedside and transported to SDU. Lasix 40 mg given IV on arrival to SDU. Once in SDU pt noted w/ decreased LOC and no longer following commands. Would open eyes at time but not tracking. ABG after approx 30-45 min on BiPAP revealed pH of  7.059, pC02 of 78.9 and p02 of 225. PCXR findings c/w pulmonary edema vs PNA and is "significantly worsened" since CXR from yesterday am.  Assessment/Plan: 1. Acute hypercarbic respiratory failure: In setting of HCAP, COPD exacerbation, pulmonary edema and OSA. Discussed pt w/ Dr [place] Bells w/ [facility] Lacy who agreed to have PCCM provider see pt ASAP to assess for likely intubation. Spoke w/ pt's daughter Caren Griffins) by phone to update on pt's current status and confirm full code status. Dr Elsworth Soho arrived to bedside and pt was prepared for urgent intubation (see MD notes). Appreciate PCCM input and management.   Jeryl [place], NP-C [facility] Pager [phone]

## 2015-10-26 NOTE — Progress Notes (Signed)
eLink Physician-Brief Progress Note Patient Name: [name] DOB: 1924/03/23 MRN: [id number]   Date of Service  10/26/2015  HPI/Events of Note  Post intubation ABG with resp acidosis, better  eICU Interventions  Hold off on vent changes other than decreasing FiO2 Repeat ABG later this morning     Intervention Category Major Interventions: Respiratory failure - evaluation and management  Simonne Maffucci 10/26/2015, 2:11 AM

## 2015-10-26 NOTE — Progress Notes (Signed)
eLink Physician-Brief Progress Note Patient Name: [name] DOB: 06/23/1924 MRN: [id number]   Date of Service  10/26/2015  HPI/Events of Note  Hypotension post intubation  eICU Interventions  Saline bolus Start neo synephrine     Intervention Category Major Interventions: Hypotension - evaluation and management  Simonne Maffucci 10/26/2015, 1:51 AM

## 2015-10-26 NOTE — Progress Notes (Signed)
PULMONARY / CRITICAL CARE MEDICINE   Name: [name] MRN: [id number] DOB: 06-25-1924    ADMISSION DATE:  10/24/2015 CONSULTATION DATE:  10/26/2015  REFERRING MD:  Dr. Charlies Silvers  CHIEF COMPLAINT:  Hypercarbic resp fialure  HISTORY OF PRESENT ILLNESS:   82 year old male past medical history as below, which includes hypertension, CAD, COPD(followed by Dr. Melvyn Novas), prostate cancer, OSA, CVA. He was also recently admitted from 3/2 through 3/11 where he was treated for small bowel obstruction and ileus. He improved with conservative management and was able to be discharged home in stable condition. He returned to the emergency department the following day (3/12) complaining of shortness of breath and productive cough (see green/yellow sputum), wheezing, malaise, poor appetite. The emergency department a chest x-ray was obtained which demonstrated bilateral pleural effusions and posterior lower lobe airspace disease concerning for atelectasis versus infection. He was admitted for HCAP to the hospitalist team. He was treated with aztreonam and vancomycin due to penicillin allergy. There is also concern that an exacerbation of known COPD was present. He was started on systemic steroids and scheduled DuoNeb's. At the time of admission it appeared as though his small bowel obstruction remained resolved. 3/13 his condition did not improve, and progressively worsened over the course the day. 3/14 early a.m. he was transferred to the ICU with significant respiratory distress. Profound hypercapnic respiratory failure noted on ABG. PCCM has been asked to evaluate.  SUBJECTIVE: Patient intubated earlier this morning. No acute distress at this time. Does not to questions appropriately. Placed in bilateral mittens by nursing staff for patient safety.  REVIEW OF SYSTEMS:  Unable to obtain given intubation.   VITAL SIGNS: BP 115/49 mmHg  Pulse 77  Temp(Src) 97.9 F (36.6 C) (Core (Comment))  Resp 11  Ht 6' (1.829 m)   Wt 80.287 kg (177 lb)  BMI 24.00 kg/m2  SpO2 94%  HEMODYNAMICS:    VENTILATOR SETTINGS: Vent Mode:  [-] PSV;CPAP FiO2 (%):  [30 %-100 %] 30 % Set Rate:  [18 bmp] 18 bmp Vt Set:  [620 mL] 620 mL PEEP:  [5 cmH20] 5 cmH20 Pressure Support:  [5 cmH20] 5 cmH20 Plateau Pressure:  [15 [id number] cmH20] 24 cmH20  INTAKE / OUTPUT: I/O last 3 completed shifts: In: 2290.4 [P.O.:480; I.V.:460.4; Other:1000; IV Piggyback:350] Out: 1590 [Urine:1590]  PHYSICAL EXAMINATION: General:  No distress. No family at bedside. Resting with eyes closed. Integument:  Warm & dry. No rash on exposed skin.  HEENT:  No scleral injection or icterus. Endotracheal tube in place.  Cardiovascular:  Regular rate. No edema. No appreciable JVD.  Pulmonary:  Good aeration & clear to auscultation bilaterally. Symmetric chest wall rise on ventilator. Abdomen: Soft. Hypoactive bowel sounds. Nondistended.  Neurological: Patient following commands and moving all 4 extremities appropriately. Nods to questions.  LABS:  BMET  Recent Labs Lab 10/24/15 1358 10/25/15 0439 10/26/15 0245  NA 139 140 140  K 4.5 4.2 4.8  CL 108 110 111  CO2 22 21* 21*  BUN 18 22* 26*  CREATININE 1.38* 1.37* 1.55*  GLUCOSE 113* 201* 270*    Electrolytes  Recent Labs Lab 10/24/15 1358 10/25/15 0439 10/26/15 0245  CALCIUM 8.5* 8.1* 8.2*    CBC  Recent Labs Lab 10/24/15 1358 10/25/15 0439 10/26/15 0133  WBC 7.4 4.6 15.4*  HGB 7.9* 7.4* 9.4*  HCT 25.5* 24.6* 32.0*  PLT 186 187 262    Coag's No results for input(s): APTT, INR in the last 168 hours.  Sepsis Markers  Recent Labs Lab 10/24/15 1406 10/26/15 0245  LATICACIDVEN 1.52  --   PROCALCITON  --  <0.10    ABG  Recent Labs Lab 10/26/15 0025 10/26/15 0200  PHART 7.059* 7.153*  PCO2ART 78.9* 57.9*  PO2ART 225* 386*    Liver Enzymes  Recent Labs Lab 10/24/15 1358  AST 18  ALT 12*  ALKPHOS 56  BILITOT 0.8  ALBUMIN 3.1*    Cardiac  Enzymes  Recent Labs Lab 10/26/15 0245  TROPONINI 0.06*    Glucose  Recent Labs Lab 10/26/15 0358 10/26/15 0747  GLUCAP 221* 193*    Imaging Dg Chest Port 1 View  10/26/2015  CLINICAL DATA:  Intubation EXAM: PORTABLE CHEST 1 VIEW COMPARISON:  Yesterday FINDINGS: New endotracheal tube with tip between the clavicular heads and carina. An orogastric tube reaches stomach at least. Diffuse interstitial opacities are improved. Kerley lines are still noted. There is asymmetric airspace disease to the left. No definitive effusion. When accounting for a skin fold on the left there is no visible pneumothorax. IMPRESSION: 1. New endotracheal and orogastric tubes are in good position. 2. Pulmonary edema with improvement since earlier. 3. Airspace disease which could be asymmetric alveolar edema or superimposed pneumonia. Electronically Signed   By: Monte Fantasia M.D.   On: 10/26/2015 02:00   Dg Chest Port 1 View  10/26/2015  CLINICAL DATA:  Acute onset of shortness of breath. Initial encounter. EXAM: PORTABLE CHEST 1 VIEW COMPARISON:  Chest radiograph performed earlier today at 9:24 a.m. FINDINGS: The lungs are hyperexpanded. The lung bases are incompletely imaged on this study. Vascular congestion is noted. Bibasilar airspace opacities likely reflect pulmonary edema, significantly worsened from the recent prior study, though pneumonia could have a similar appearance. No definite pleural effusion or pneumothorax is seen. The cardiomediastinal silhouette is within normal limits. No acute osseous abnormalities are seen. IMPRESSION: Lungs hypoexpanded. Vascular congestion noted. Bibasilar airspace opacities likely reflect pulmonary edema, significantly worsened from the prior study, though pneumonia could have a similar appearance. Electronically Signed   By: Garald Balding M.D.   On: 10/26/2015 00:06   Dg Abd Portable 1v  10/26/2015  CLINICAL DATA:  Followup partial small bowel obstruction. EXAM:  PORTABLE ABDOMEN - 1 VIEW COMPARISON:  10/20/2015 and earlier, including CT abdomen and pelvis 10/15/2015. FINDINGS: Persisting gaseous distention of multiple loops of small bowel in the mid abdomen. Gas and stool in normal caliber colon. Nasogastric tube looped in the stomach with its tip in the distal body. No suggestion of free air on the supine image. Prior cholecystectomy. Developing airspace opacities in both lung bases. IMPRESSION: 1. No significant change since yesterday in the partial small bowel obstruction. 2. Developing pneumonia in both lung bases. Electronically Signed   By: Evangeline Dakin M.D.   On: 10/26/2015 07:58     STUDIES:  Port CXR 3/14:  Personally reviewed by me. Mild improvement in bilateral parenchymal opacity on ventilator. Endotracheal tube in satisfactory position. Abd X ray 3/14:  Personally reviewed by me. OG tube in stomach.   MICROBIOLOGY: Tracheal aspirate 3/14 >>> MRSA PCR 3/14:  Negative   ANTIBIOTICS: Vancomycin 3/12 > Aztreonam 3/12 >  SIGNIFICANT EVENTS: 3/2-3/11 admit for SBO and ileus 3/12 admitted for suspected HCAP + COPD exacerbation 3/14 transfer to ICU & intubation  LINES/TUBES: OETT 8.0 3/14>>> OGT 3/14>>> FOLEY 3/14>>> PIV x3  ASSESSMENT / PLAN:  PULMONARY A: Acute Hypercarbic Respiratory Failure - Secondary to COPD Exacerbation. Acute Hypoxic Respiratory Failure - Secondary to HCAP. HCAP COPD  Exacerbation (FEV1 45% on 11/13/13) H/O OSA   P:   Vent bundle Continue IV solumedrol 40 q 12h Duonebs every 4 hours  Daily sedation vacation & SBT ABG now  CARDIOVASCULAR A:  HTN Chronic systolic/diastolic CHF (LVEF A999333, Grade 1 DD [01/09/2015]) H/O HLD H/O MI Hypotension Post Intubation - Resolved. Due to meds/ + pressure.  P:  Telemetry monitoring Holding home Coreg Zocor VT daily ASA VT daily Holding on further diuresis Checking TTE for new wall motion abnormality  RENAL A:   CKD III (Cr at baseline 1.37)  P:    Trending UOP with Foley Monitoring renal function w/ daily BUN/Creatinine Correct electrolytes as indicated  GASTROINTESTINAL A:   Recent SBO/Ileus  P:   NPO now Protonix IV daily  HEMATOLOGIC A:   Anemia (Hbg 7.4 appears neat baseline) - Mild. No signs of active bleeding. Thalassemia   P:  Trending cell counts daily w/ CBC Transfuse per ICU guidelines Enoxaparin for VTE ppx SCDs  INFECTIOUS A:   HCAP  P:   ABX as above Tracheal aspirate culture PCT per algorithm  ENDOCRINE A:   Hyperglycemia - No h/o DM. Likely secondary to steroids.  P:   Accu-Cheks every 4 hour Sliding-scale insulin per algorithm  NEUROLOGIC A:   Acute Encephalopathy - Likely secondary to hypercarbia.  P:   RASS goal: -1 Versed IV prn Fentanyl IV prn Lyrica bid  FAMILY  - Updates:daughter at bedside 3/14 by Dr. Elsworth Soho.  - Inter-disciplinary family meet or Palliative Care meeting due by:  3/20  TODAY'S SUMMARY:  82 year old male with recent admit for SBO discharged 3/11. Admitted 3/12 for HCAP vs COPD exacerbation. Treated with broad ABX, steroids, nebs. 3/13 progressively worse, moved to ICU, intubated. Patient's respiratory status continuing to improve. Holding on further diuresis at this time in the setting of marginal blood pressure. Checking transthoracic echocardiogram. Continuing treatment of COPD with Solu-Medrol & DuoNeb. Plan for spontaneous breathing trial in the morning.  I have spent an additional 17 minutes of critical care time today caring for the patient and reviewing his electronic medical record.   Sonia Baller Ashok Cordia, M.D. [facility] Pulmonary & Critical Care Pager:  [phone] After 3pm or if no response, call [phone] 10/26/2015 10:40 AM

## 2015-10-26 NOTE — Progress Notes (Signed)
While walking in the hall I heard the patient yelling for help. When I entered the room, the patient appeared to be in distress and reported dyspnea. Respiratory therapist heard the patient saying that he couldn't breath while she was on the floor doing her rounds and started a breathing treatment. Patient's O2 sats were 98 on 2L of O2, but O2 was cut to 4L in an attempt to improve patient condition. Patient was also repositioned. Despite all efforts, the patient remained in distress and the physician was paged. Orders from the physician were carried out, but patient still remained in distress. Sats dropped to 94 while on 4L of O2. The physician upon assessing the patient and his labored breathing, decided to move the patient to step down for bipap. The patient was very much alert and oriented at the top of the shift, but dyspnea came upon the patient rather quickly and as time advanced, he became increasingly less alert and responsive. Patient was transported to step down and report was given to his nurse there.

## 2015-10-26 NOTE — Progress Notes (Signed)
Echocardiogram 2D Echocardiogram with Definity has been performed.  [name] 10/26/2015, 3:54 PM

## 2015-10-26 NOTE — Consult Note (Signed)
PULMONARY / CRITICAL CARE MEDICINE   Name: [name] MRN: [id number] DOB: October 13, 1923    ADMISSION DATE:  10/24/2015 CONSULTATION DATE:  10/26/2015  REFERRING MD:  Dr. Charlies Silvers  CHIEF COMPLAINT:  Hypercarbic resp fialure  HISTORY OF PRESENT ILLNESS:   82 year old male past medical history as below, which includes hypertension, CAD, COPD(followed by Dr. Melvyn Novas), prostate cancer, OSA, CVA. He was also recently admitted from 3/2 through 3/11 where he was treated for small bowel obstruction and ileus. He improved with conservative management and was able to be discharged home in stable condition. He returned to the emergency department the following day (3/12) complaining of shortness of breath and productive cough (see green/yellow sputum), wheezing, malaise, poor appetite. The emergency department a chest x-ray was obtained which demonstrated bilateral pleural effusions and posterior lower lobe airspace disease concerning for atelectasis versus infection. He was admitted for HCAP to the hospitalist team. He was treated with aztreonam and vancomycin due to penicillin allergy. There is also concern that an exacerbation of known COPD was present. He was started on systemic steroids and scheduled DuoNeb's. At the time of admission it appeared as though his small bowel obstruction remained resolved. 3/13 his condition did not improve, and progressively worsened over the course the day. 3/14 early a.m. he was transferred to the ICU with significant respiratory distress. Profound hypercapnic respiratory failure noted on ABG. PCCM has been asked to evaluate.  PAST MEDICAL HISTORY :  He  has a past medical history of Carotid artery stenosis; Essential hypertension; Myocardial infarction ([place]) (1979); Coronary artery disease; COPD (chronic obstructive pulmonary disease) ([place]); History of stroke; Prostate cancer ([place]); Arthritis; Foot drop, right; Neuropathy ([place]); Anemia; Urinary incontinence, male, stress; GERD  (gastroesophageal reflux disease); Thalassemia; Wears dentures; Deafness in left ear; Sleep apnea; Ischemic cardiomyopathy; Hypercholesteremia; Anemia; Osteoarthritis; Stroke [facility]); Heart attack ([place]); Vertigo; Hereditary and idiopathic peripheral neuropathy (03/01/2015); and Abnormality of gait (03/01/2015).  PAST SURGICAL HISTORY: He  has past surgical history that includes Penile prosthesis implant; Cholecystectomy; Right carotid endarterectomy; Back surgery; Lumbar laminectomy (03/24/10); Ankle surgery; Cardiac catheterization; Appendectomy; Joint replacement (Left); Radioactive seed implant (2009); Pars plana vitrectomy (03/19/2012); Carotid endarterectomy (Right); Spine surgery; Eye surgery; Cataract extraction w/ intraocular lens implant (Left); Endarterectomy (Left, 09/28/2014); and Patch angioplasty (Left, 09/28/2014).  Allergies  Allergen Reactions  . Clopidogrel Bisulfate Hives    REACTION: red blue, black blotches  . Dilaudid [Hydromorphone Hcl] Hives    Hallucinations, little green men working on the building  . Penicillins Hives    Has patient had a PCN reaction causing immediate rash, facial/tongue/throat swelling, SOB or lightheadedness with hypotension: Yes Has patient had a PCN reaction causing severe rash involving mucus membranes or skin necrosis: No Has patient had a PCN reaction that required hospitalization No Has patient had a PCN reaction occurring within the last 10 years: No If all of the above answers are "NO", then may proceed with Cephalosporin use.   . Sulfa Antibiotics Other (See Comments)    Nightmares, hallucinations  . Sulfonamide Derivatives Other (See Comments)    Nightmares, hallucinations  . Doxycycline Hyclate Rash    No current facility-administered medications on file prior to encounter.   Current Outpatient Prescriptions on File Prior to Encounter  Medication Sig  . acetaminophen (TYLENOL) 325 MG tablet Take 650 mg by mouth every 6 (six) hours as  needed for mild pain.   . carvedilol (COREG) 12.5 MG tablet TAKE 1 TABLET TWICE DAILY WITH A MEAL  . furosemide (LASIX)  20 MG tablet Take 40 mg every morning  . Glycopyrrolate-Formoterol (BEVESPI AEROSPHERE) 9-4.8 MCG/ACT AERO Inhale 2 puffs into the lungs every 12 (twelve) hours.  Marland Kitchen LINZESS 145 MCG CAPS capsule Take 1 capsule by mouth daily with breakfast.  . losartan (COZAAR) 25 MG tablet Take 1 tablet (25 mg total) by mouth daily.  . meclizine (ANTIVERT) 25 MG tablet Take 1 tablet (25 mg total) by mouth daily as needed for dizziness.  . mometasone-formoterol (DULERA) 200-5 MCG/ACT AERO Inhale 2 puffs into the lungs 2 (two) times daily.  Marland Kitchen MYRBETRIQ 50 MG TB24 tablet Take 50 mg by mouth daily.  Marland Kitchen NITROSTAT 0.4 MG SL tablet PLACE 1 TABLET (0.4 MG TOTAL) UNDER THE TONGUE EVERY 5 (FIVE) MINUTES AS NEEDED. FOR CHEST PAIN  . omeprazole (PRILOSEC) 40 MG capsule Take 1 capsule (40 mg total) by mouth daily.  . polyethylene glycol powder (MIRALAX) powder Take 17 g by mouth daily as needed for mild constipation.   . pregabalin (LYRICA) 75 MG capsule Take 75 mg by mouth 2 (two) times daily.   . SYMBICORT 160-4.5 MCG/ACT inhaler INHALE 2 PUFFS FIRST THING IN THE MORNING AND THEN TAKE 2 PUFFS 12 HOURS LATER  . tiotropium (SPIRIVA) 18 MCG inhalation capsule Place 1 capsule (18 mcg total) into inhaler and inhale daily.  . VENTOLIN HFA 108 (90 BASE) MCG/ACT inhaler INHALE 2 PUFFS INTO THE LUNGS EVERY 4 HOURS AS NEEDED FOR WHEEZING OR SHORTNESS OF BREATH (Patient taking differently: INHALE 2 PUFFS INTO THE LUNGS daily.)  . vitamin C (ASCORBIC ACID) 500 MG tablet Take 500 mg by mouth daily.    FAMILY HISTORY:  His indicated that his mother is deceased. He indicated that his father is deceased. He indicated that his sister is deceased. He indicated that two of his three brothers are alive.   SOCIAL HISTORY: He  reports that he quit smoking about 38 years ago. His smoking use included Cigarettes. He has a 20  pack-year smoking history. He has never used smokeless tobacco. He reports that he does not drink alcohol or use illicit drugs.  REVIEW OF SYSTEMS:   Unable to obtain since altered mental status  SUBJECTIVE:    VITAL SIGNS: BP 133/56 mmHg  Pulse 81  Temp(Src) 98.6 F (37 C) (Oral)  Resp 20  Ht 6' (1.829 m)  Wt 80.287 kg (177 lb)  BMI 24.00 kg/m2  SpO2 99%  HEMODYNAMICS:    VENTILATOR SETTINGS:    INTAKE / OUTPUT: I/O last 3 completed shifts: In: 1029.2 [P.O.:480; I.V.:199.2; IV Piggyback:350] Out: 1060 [Urine:1060]  PHYSICAL EXAMINATION: Gen. well-nourished,elderly man, in extremis on bipap ENT - edentulous, class 2 airway, FF mask Neck: No JVD, no thyromegaly, no carotid bruits Lungs: using  accessory muscles, no dullness to percussion, bibasal rales & scattered rhonchi  Cardiovascular: Rhythm regular, heart sounds  normal, no murmurs, 1+ peripheral edema Abdomen: soft and non-tender, no hepatosplenomegaly, BS normal. Musculoskeletal: No deformities, no cyanosis or clubbing Neuro:  unresponsive, non focal grossly Skin:  Warm, no lesions/ rash   LABS:  BMET  Recent Labs Lab 10/22/15 0539 10/24/15 1358 10/25/15 0439  NA 140 139 140  K 4.5 4.5 4.2  CL 109 108 110  CO2 22 22 21*  BUN 14 18 22*  CREATININE 1.18 1.38* 1.37*  GLUCOSE 106* 113* 201*    Electrolytes  Recent Labs Lab 10/22/15 0539 10/24/15 1358 10/25/15 0439  CALCIUM 8.5* 8.5* 8.1*    CBC  Recent Labs Lab 10/22/15 0539 10/24/15  1358 10/25/15 0439  WBC 5.4 7.4 4.6  HGB 7.5* 7.9* 7.4*  HCT 25.1* 25.5* 24.6*  PLT 153 186 187    Coag's No results for input(s): APTT, INR in the last 168 hours.  Sepsis Markers  Recent Labs Lab 10/24/15 1406  LATICACIDVEN 1.52    ABG No results for input(s): PHART, PCO2ART, PO2ART in the last 168 hours.  Liver Enzymes  Recent Labs Lab 10/24/15 1358  AST 18  ALT 12*  ALKPHOS 56  BILITOT 0.8  ALBUMIN 3.1*    Cardiac  Enzymes No results for input(s): TROPONINI, PROBNP in the last 168 hours.  Glucose No results for input(s): GLUCAP in the last 168 hours.  Imaging Dg Chest 2 View  10/25/2015  CLINICAL DATA:  Shortness of breath and cough EXAM: CHEST  2 VIEW COMPARISON:  10/24/15 FINDINGS: Cardiac shadow is stable. The lungs are well aerated bilaterally. Small effusions are again noted. No focal confluent infiltrate is seen. IMPRESSION: Small bilateral pleural effusions. Improved aeration in the bases is noted. Electronically Signed   By: Inez Catalina M.D.   On: 10/25/2015 10:07   Dg Chest Port 1 View  10/26/2015  CLINICAL DATA:  Acute onset of shortness of breath. Initial encounter. EXAM: PORTABLE CHEST 1 VIEW COMPARISON:  Chest radiograph performed earlier today at 9:24 a.m. FINDINGS: The lungs are hyperexpanded. The lung bases are incompletely imaged on this study. Vascular congestion is noted. Bibasilar airspace opacities likely reflect pulmonary edema, significantly worsened from the recent prior study, though pneumonia could have a similar appearance. No definite pleural effusion or pneumothorax is seen. The cardiomediastinal silhouette is within normal limits. No acute osseous abnormalities are seen. IMPRESSION: Lungs hypoexpanded. Vascular congestion noted. Bibasilar airspace opacities likely reflect pulmonary edema, significantly worsened from the prior study, though pneumonia could have a similar appearance. Electronically Signed   By: Garald Balding M.D.   On: 10/26/2015 00:06     STUDIES:    CULTURES: Tracheal aspirate 3/14 >>>  ANTIBIOTICS: Vancomycin 3/12 > Aztreonam 3/12 >  SIGNIFICANT EVENTS: 3/2-3/11 admit for SBO and ileus 3/12 admitted for suspected HCAP + COPD exacerbation  LINES/TUBES: 3/14 ETT  DISCUSSION: 82 year old male with recent admit for SBO discharged 3/11. Admitted 3/12 for HCAP vs COPD exacerbation. Treated with broad ABX, steroids, nebs. 3/13 progressively worse, moved  to ICU, intubated.   ASSESSMENT / PLAN:  PULMONARY A: Acute hypercarbic/hypoxemic respiratory failure secondary to HCAP +/- COPD exacerbation OSA   P:   STAT intubation ABG CXR for ETT placement Vent bundle Continue IV solumedrol 40 q 12h Continue scheduled duonebs  CARDIOVASCULAR A:  HTN Chronic systolic/diastolic CHF (LVEF A999333, Grade 1 DD [01/09/2015]) H/o HLD, MI Post intubation hypotension due to meds/ + pressure P:  Telemetry monitoring Hold home coreg, ct zocor via tube Rpt Echocardiogram if trop + Check BNP, trop Lasix 40 q 12h  RENAL A:   CKD III (Cr at baseline 1.37)  P:   Follow BMP Correct electrolytes as indicated  GASTROINTESTINAL A:   Recent SBO/Ileus  P:   NPO now Protonix for SUP XR abd - NGT to suction if needed  HEMATOLOGIC A:   Anemia (Hbg 7.4 appears neat baseline) Thalassemia   P:  Follow CBC Transfuse per ICU guidelines Enoxaparin for VTE ppx  INFECTIOUS A:   HCAP  P:   ABX as above Tracheal aspirate culture PCT  ENDOCRINE A:   Hyperglycemia without history DM, likely d/t steroids  P:   CBG monitoring  and SSI  NEUROLOGIC A:   Acute metabolic encephalopathy secondary to hypercapnia  P:   RASS goal: -1 PRN fentanyl / versed per pAD protocol - can progress to fent gtt if needed Daily WUA Monitor   FAMILY  - Updates:daughter at bedside 3/14  - Inter-disciplinary family meet or Palliative Care meeting due by:  3/20  Summary - Acute hypercarbic resp failure with New BL infiltrates in this 45 y.owith LV dysfunction, DD incl worsening HCAP vs acute pulmonary edema  r/o ACS    The patient is critically ill with multiple organ systems failure and requires high complexity decision making for assessment and support, frequent evaluation and titration of therapies, application of advanced monitoring technologies and extensive interpretation of multiple databases. Critical Care Time devoted to patient care services  described in this note independent of APP time is 55 minutes.   Kara Mead MD. [facility] [place] Pulmonary & Critical care Pager [phone] If no response call [phone]    10/26/2015 1:10 AM

## 2015-10-26 NOTE — Progress Notes (Signed)
[facility]  Patient Status: Active (receiving services up to time of hospitalization)  [facility] is providing the following services: PT  If patient discharges after hours, please call [phone].   Edwinna Areola 10/26/2015, 10:09 AM

## 2015-10-26 NOTE — Progress Notes (Signed)
Patient agitated and pulled on ETT despite mitts and frequent verbal contacts reminding him not to remove tube.. Tube disconnected from vent but seemingly remains in place in airway. RT called to come assess and received verbal order from NP to use restraints. Patient remains restless and indicates understanding of purpose of ETT. Patient remains alert, with good O2 saturation and volumes pulled by patient remain unchanged.  Will continue to monitor.

## 2015-10-26 NOTE — Progress Notes (Signed)
Initial Nutrition Assessment  DOCUMENTATION CODES:   Not applicable  INTERVENTION:  -RD to continue to monitor for needs  NUTRITION DIAGNOSIS:   Inadequate oral intake related to inability to eat as evidenced by NPO status.  GOAL:   Patient will meet greater than or equal to 90% of their needs  MONITOR:   Vent status, I & O's, Diet advancement, Labs  REASON FOR ASSESSMENT:   Ventilator   ASSESSMENT:   [name] is a 82 y.o. male With a past medical history of chronic obstructive pulmonary disease, Stage III chronic disease, hypertension, recently admitted to the medicine service from 10/14/2015 through 10/23/2015; In the last 24 hours he has Developed worsening Shortness of breath associated with cough, having a thick green/yellow sputum production, wheezing, generalized weakness, malaise, poor appetite  Patient is currently intubated on ventilator support MV: 6.6 L/min Temp (24hrs), Avg:97.3 F (36.3 C), Min:96.6 F (35.9 C), Max:98.6 F (37 C)  Propofol: None  No family at bedside during time of visit.  Per chart pt was eating well prior to intubation; 50-100% meal completion.  He was recently discharged following SBO/ileus treatment.  Hypotension post intubation- was on Neo but is held now. Per MD note, Posterior lower lobe airspace consistent with HCAP were found. Likely to be extubated later today or tomorrow.   Medications: Fentanyl PRN, Versed PRN, Solumedrol  Diet Order:  Diet NPO time specified  Skin:  Wound (see comment) (Ecchymosis, skin tears, to arm and abdomen.)  Last BM:  3/11  Height:   Ht Readings from Last 1 Encounters:  10/26/15 6' (1.829 m)    Weight:   Wt Readings from Last 1 Encounters:  10/24/15 177 lb (80.287 kg)    Ideal Body Weight:  80.9 kg  BMI:  Body mass index is 24 kg/(m^2).  Estimated Nutritional Needs:   Kcal:  [id number]  Protein:  95-120 grams  Fluid:  >/= 1.8L  EDUCATION NEEDS:   No education needs  identified at this time  Satira Anis. Asharia Lotter, MS, RD LDN After Hours/Weekend Pager [phone]

## 2015-10-27 ENCOUNTER — Inpatient Hospital Stay (HOSPITAL_COMMUNITY): Payer: Medicare Other

## 2015-10-27 DIAGNOSIS — N183 Chronic kidney disease, stage 3 (moderate): Secondary | ICD-10-CM

## 2015-10-27 DIAGNOSIS — J9602 Acute respiratory failure with hypercapnia: Secondary | ICD-10-CM

## 2015-10-27 DIAGNOSIS — R609 Edema, unspecified: Secondary | ICD-10-CM

## 2015-10-27 LAB — RENAL FUNCTION PANEL
ALBUMIN: 2.6 g/dL — AB (ref 3.5–5.0)
ANION GAP: 9 (ref 5–15)
BUN: 34 mg/dL — AB (ref 6–20)
CHLORIDE: 107 mmol/L (ref 101–111)
CO2: 21 mmol/L — ABNORMAL LOW (ref 22–32)
Calcium: 8.3 mg/dL — ABNORMAL LOW (ref 8.9–10.3)
Creatinine, Ser: 1.68 mg/dL — ABNORMAL HIGH (ref 0.61–1.24)
GFR calc Af Amer: 40 mL/min — ABNORMAL LOW (ref >60)
GFR calc non Af Amer: 34 mL/min — ABNORMAL LOW (ref >60)
GLUCOSE: 175 mg/dL — AB (ref 65–99)
PHOSPHORUS: 3.1 mg/dL (ref 2.5–4.6)
POTASSIUM: 4.4 mmol/L (ref 3.5–5.1)
Sodium: 137 mmol/L (ref 135–145)

## 2015-10-27 LAB — GLUCOSE, CAPILLARY
GLUCOSE-CAPILLARY: 111 mg/dL — AB (ref 65–99)
GLUCOSE-CAPILLARY: 136 mg/dL — AB (ref 65–99)
GLUCOSE-CAPILLARY: 145 mg/dL — AB (ref 65–99)
GLUCOSE-CAPILLARY: 150 mg/dL — AB (ref 65–99)
GLUCOSE-CAPILLARY: 156 mg/dL — AB (ref 65–99)
Glucose-Capillary: 161 mg/dL — ABNORMAL HIGH (ref 65–99)

## 2015-10-27 LAB — CBC WITH DIFFERENTIAL/PLATELET
BASOS ABS: 0 10*3/uL (ref 0.0–0.1)
BASOS PCT: 0 %
EOS ABS: 0 10*3/uL (ref 0.0–0.7)
Eosinophils Relative: 0 %
HEMATOCRIT: 27.6 % — AB (ref 39.0–52.0)
HEMOGLOBIN: 8.5 g/dL — AB (ref 13.0–17.0)
LYMPHS PCT: 4 %
Lymphs Abs: 0.6 10*3/uL — ABNORMAL LOW (ref 0.7–4.0)
MCH: 18.6 pg — ABNORMAL LOW (ref 26.0–34.0)
MCHC: 30.8 g/dL (ref 30.0–36.0)
MCV: 60.5 fL — ABNORMAL LOW (ref 78.0–100.0)
MONOS PCT: 3 %
Monocytes Absolute: 0.4 10*3/uL (ref 0.1–1.0)
NEUTROS PCT: 93 %
Neutro Abs: 13.3 10*3/uL — ABNORMAL HIGH (ref 1.7–7.7)
Platelets: 241 10*3/uL (ref 150–400)
RBC: 4.56 MIL/uL (ref 4.22–5.81)
RDW: 17.3 % — ABNORMAL HIGH (ref 11.5–15.5)
WBC: 14.3 10*3/uL — ABNORMAL HIGH (ref 4.0–10.5)

## 2015-10-27 LAB — VANCOMYCIN, TROUGH: Vancomycin Tr: 14 ug/mL (ref 10.0–20.0)

## 2015-10-27 LAB — PROCALCITONIN: Procalcitonin: 0.2 ng/mL

## 2015-10-27 LAB — MAGNESIUM: MAGNESIUM: 1.9 mg/dL (ref 1.7–2.4)

## 2015-10-27 MED ORDER — SODIUM CHLORIDE 0.9 % IV SOLN
INTRAVENOUS | Status: DC
  Administered 2015-10-28: 06:00:00 via INTRAVENOUS

## 2015-10-27 NOTE — Progress Notes (Signed)
*  Preliminary Results* Right upper extremity venous duplex completed. Right upper extremity is negative for deep vein thrombosis. There is evidence of superficial vein thrombosis involving a small segment of the right cephalic vein above the AC fossa.  10/27/2015 11:42 AM  Maudry Mayhew, RVT, RDCS, RDMS

## 2015-10-27 NOTE — Progress Notes (Signed)
PULMONARY / CRITICAL CARE MEDICINE   Name: [name] MRN: [id number] DOB: 12-09-1923    ADMISSION DATE:  10/24/2015 CONSULTATION DATE:  10/26/2015  REFERRING MD:  Dr. Charlies Silvers  CHIEF COMPLAINT:  Hypercarbic resp fialure  HISTORY OF PRESENT ILLNESS:   82 year old male past medical history as below, which includes hypertension, CAD, COPD(followed by Dr. Melvyn Novas), prostate cancer, OSA, CVA. He was also recently admitted from 3/2 through 3/11 where he was treated for small bowel obstruction and ileus. He improved with conservative management and was able to be discharged home in stable condition. He returned to the emergency department the following day (3/12) complaining of shortness of breath and productive cough (see green/yellow sputum), wheezing, malaise, poor appetite. The emergency department a chest x-ray was obtained which demonstrated bilateral pleural effusions and posterior lower lobe airspace disease concerning for atelectasis versus infection. He was admitted for HCAP to the hospitalist team. He was treated with aztreonam and vancomycin due to penicillin allergy. There is also concern that an exacerbation of known COPD was present. He was started on systemic steroids and scheduled DuoNeb's. At the time of admission it appeared as though his small bowel obstruction remained resolved. 3/13 his condition did not improve, and progressively worsened over the course the day. 3/14 early a.m. he was transferred to the ICU with significant respiratory distress. Profound hypercapnic respiratory failure noted on ABG. PCCM has been asked to evaluate.  SUBJECTIVE:  RN reports pt more alert, follows commands, weaning on PSV 5/5 since 0800 without distress.  Able to pull 1.5L, normal rate / effort.  RN notes decreased UOP, IVF KVO.     REVIEW OF SYSTEMS:  Unable to obtain given intubation.   VITAL SIGNS: BP 128/48 mmHg  Pulse 81  Temp(Src) 100.4 F (38 C) (Core (Comment))  Resp 16  Ht 6' (1.829 m)   Wt 177 lb (80.287 kg)  BMI 24.00 kg/m2  SpO2 96%  HEMODYNAMICS:    VENTILATOR SETTINGS: Vent Mode:  [-] PRVC FiO2 (%):  [30 %] 30 % Set Rate:  [18 bmp] 18 bmp Vt Set:  [620 mL] 620 mL PEEP:  [5 cmH20] 5 cmH20 Pressure Support:  [5 cmH20] 5 cmH20 Plateau Pressure:  [16 cmH20-22 cmH20] 16 cmH20  INTAKE / OUTPUT: I/O last 3 completed shifts: In: 2216.2 [I.V.:666.2; Other:1000; NG/GT:50; IV Piggyback:500] Out: 1430 [Urine:1180; Emesis/NG output:250]  PHYSICAL EXAMINATION: General:  No distress. No family at bedside. Resting with eyes closed. Integument:  Warm & dry. No rash on exposed skin.  HEENT:  No scleral injection or icterus. Endotracheal tube in place.  Cardiovascular:  Regular rate. No edema. No appreciable JVD.  Pulmonary:  Good aeration & clear to auscultation bilaterally. Symmetric chest wall rise on ventilator. Abdomen: Soft. Hypoactive bowel sounds. Nondistended.  Neurological: Patient following commands and moving all 4 extremities appropriately. Nods to yes/no to questions. Extremities:  RUE > LUE, edema  LABS:  BMET  Recent Labs Lab 10/25/15 0439 10/26/15 0245 10/27/15 0319  NA 140 140 137  K 4.2 4.8 4.4  CL 110 111 107  CO2 21* 21* 21*  BUN 22* 26* 34*  CREATININE 1.37* 1.55* 1.68*  GLUCOSE 201* 270* 175*    Electrolytes  Recent Labs Lab 10/25/15 0439 10/26/15 0245 10/27/15 0319  CALCIUM 8.1* 8.2* 8.3*  MG  --   --  1.9  PHOS  --   --  3.1    CBC  Recent Labs Lab 10/25/15 0439 10/26/15 0133 10/27/15 0319  WBC 4.6  15.4* 14.3*  HGB 7.4* 9.4* 8.5*  HCT 24.6* 32.0* 27.6*  PLT 187 262 241    Coag's No results for input(s): APTT, INR in the last 168 hours.  Sepsis Markers  Recent Labs Lab 10/24/15 1406 10/26/15 0245 10/27/15 0319  LATICACIDVEN 1.52  --   --   PROCALCITON  --  <0.10 0.20    ABG  Recent Labs Lab 10/26/15 0025 10/26/15 0200 10/26/15 1215  PHART 7.059* 7.153* 7.323*  PCO2ART 78.9* 57.9* 40.1  PO2ART  225* 386* 70.9*    Liver Enzymes  Recent Labs Lab 10/24/15 1358 10/27/15 0319  AST 18  --   ALT 12*  --   ALKPHOS 56  --   BILITOT 0.8  --   ALBUMIN 3.1* 2.6*    Cardiac Enzymes  Recent Labs Lab 10/26/15 0245  TROPONINI 0.06*    Glucose  Recent Labs Lab 10/26/15 1137 10/26/15 1553 10/26/15 2020 10/27/15 0006 10/27/15 0334 10/27/15 0816  GLUCAP 131* 120* 141* 150* 161* 156*    Imaging No results found.   STUDIES:  Port CXR 3/14 >> Mild improvement in bilateral parenchymal opacity on ventilator. Endotracheal tube in satisfactory position. Abd X ray 3/14 >> OG tube in stomach ECHO / TTE 3/14 >> LVEF 40-45%, akinesis of mid-apicalanteroseptal & apical myocardium, grade 1 diastolic dysfunction   MICROBIOLOGY: Tracheal aspirate 3/14 >> MRSA PCR 3/14 >>  Negative   ANTIBIOTICS: Vancomycin 3/12 >>  Aztreonam 3/12 >>  SIGNIFICANT EVENTS: 3/2-3/11 admit for SBO and ileus 3/12  admitted for suspected HCAP + COPD exacerbation 3/14  transfer to ICU & intubation 3/15  Extubated  LINES/TUBES: OETT 8.0 3/14 >> 3/15 OGT 3/14 >> 3/15 FOLEY 3/14 >> 3/15 PIV x3  ASSESSMENT / PLAN:  PULMONARY A: Acute Hypercarbic Respiratory Failure - Secondary to COPD Exacerbation. Acute Hypoxic Respiratory Failure - Secondary to HCAP. HCAP COPD Exacerbation (FEV1 45% on 11/13/13) H/O OSA   P:   Meets criteria for extubation, proceed 3/15 Continue IV solumedrol 40 q 12h Duonebs every 4 hours  Pulmonary hygiene post extubation   CARDIOVASCULAR A:  HTN Chronic systolic/diastolic CHF (LVEF A999333, Grade 1 DD [01/09/2015]) H/O HLD H/O MI Hypotension Post Intubation - Resolved. Due to meds/ + pressure.  P:  Telemetry monitoring Holding home Coreg Continue zocor, ASA  See ECHO results above, unchanged from previous ECHO in 12/2014  RENAL A:   CKD III (Cr at baseline 1.37)  P:   D/C foley  NS@ 40 ml/hr Monitoring renal function w/ daily BUN/Creatinine Correct  electrolytes as indicated  GASTROINTESTINAL A:   Recent SBO/Ileus  P:   NPO post extubation, allow sips with meds / ice chips.  Monitor response Protonix IV daily  HEMATOLOGIC A:   Anemia (Hbg 7.4 appears neat baseline) - Mild. No signs of active bleeding. Thalassemia  RUE Swelling - r/o DVT   P:  Trending cell counts daily w/ CBC Transfuse per ICU guidelines Enoxaparin for VTE ppx SCDs Assess RUE duplex  INFECTIOUS A:   HCAP  P:   ABX as above, consider narrowing to levaquin once tolerance of oral medications determined (multiple allergies) Tracheal aspirate culture PCT per algorithm  ENDOCRINE A:   Hyperglycemia - No h/o DM. Likely secondary to steroids.  P:   Accu-Cheks every 4 hour Sliding-scale insulin per algorithm  NEUROLOGIC A:   Acute Encephalopathy - Likely secondary to hypercarbia.  P:   RASS goal: n/a Discontinue IV sedation Minimize sedating medications as able  Lyrica bid  FAMILY  - Updates:  No family at bedside am 3/15  - Inter-disciplinary family meet or Palliative Care meeting due by:  3/20   Noe Gens, NP-C [place] [facility] Pgr: [phone] or if no answer [phone] 10/27/2015, 11:05 AM   PCCM Attending Note: I have personally seen and evaluated the patient with nurse practitioner. Please refer to her progress note which I reviewed in detail. 82 year old male with recent admit for SBO discharged 3/11. Admitted 3/12 for HCAP vs COPD exacerbation. Treated with broad ABX, steroids, nebs. 3/13 progressively worse, moved to ICU, intubated. Patient was successfully extubated this morning. Continuing broad-spectrum antibiotics while awaiting for tracheal aspirate culture. Mental status has significantly improved along with patient's hypercarbia. Continuing Solu-Medrol IV every 12 hours and scheduled nebulizer therapies. Continuing to hold home Coreg given borderline blood pressure. We will monitor the patient in the intensive  care unit closely overnight given the potential for reintubation and further clinical decompensation.  I have spent a total of 33 minutes of critical care time today caring for the patient and reviewing his electronic medical record.   Sonia Baller Ashok Cordia, M.D. [facility] Pager: [phone] After 3pm or if no response, call [phone] 10/26/2015 10:40 AM

## 2015-10-27 NOTE — Progress Notes (Signed)
Pharmacy - Vancomycin  Assessment:    Please see note from Reuel Boom, PharmD earlier today for full details.  Briefly, 82 y.o. male on vancomycin and aztreonam for HCAP   VT today 14, drawn appropriately  Plan:   Continue vancomycin as ordered; suspect antibiotics will be narrowed tomorrow.  Trough was primarily drawn to ensure vancomycin was not contributing to AKI.  Continue aztreonam as ordered  Given patient allergies, suggest narrowing to Levaquin PO as appropriate.  Reuel Boom, PharmD, BCPS Pager: [phone] 10/27/2015, 3:21 PM

## 2015-10-27 NOTE — Procedures (Signed)
Extubation Procedure Note  Patient Details:   Name: CLEOPHAS VERDERAME DOB: 1923-11-25 MRN: [id number]   Airway Documentation:  Airway 8 mm (Active)  Secured at (cm) 24 cm 10/27/2015  8:29 AM  Measured From Lips 10/27/2015  8:29 AM  Secured Location Left 10/27/2015  8:29 AM  Secured By [employer] 10/27/2015  8:29 AM  Tube Holder Repositioned Yes 10/27/2015  8:29 AM  Cuff Pressure (cm H2O) 24 cm H2O 10/26/2015  8:21 AM  Site Condition Dry 10/27/2015  8:00 AM    Evaluation  O2 sats: 96 Complications: none Patient tolerated procedure well. Bilateral Breath Sounds: Clear, Diminished Suctioning: Oral, Airway Pt able to speak  Martha Clan 10/27/2015, 11:16 AM

## 2015-10-27 NOTE — Progress Notes (Signed)
Pharmacy Antibiotic Note  [name] is a 82 y.o. male discharged yesterday from [facility] for ileus and pseudo bowel obstruction admitted on 10/24/2015 with increasing cough, fatigue, and low-grade fevers. CXR shows posterior LL airspace disease.  Currently on day 4 Vancomycin + Aztreonam for HCAP.  New low-grade fevers and leukocytosis overnight; CrCl worsening  -Noted allergies to pencillins (rash, facial/tongue/throat swelling or lightheadedness with hypotension) and sulfas (nightmares, hallucinations).   Plan:  Continue vancomycin and aztreonam as currently ordered  Would normally hold off on VT with planned transition to PO soon, but with new fevers, leukocytosis, and worsening renal function, would like a clearer picture of vanc dosing and if contributing to renal failure.   Temp (24hrs), Avg:100 F (37.8 C), Min:99 F (37.2 C), Max:100.4 F (38 C)   Recent Labs Lab 10/22/15 0539 10/24/15 1358 10/24/15 1406 10/25/15 0439 10/26/15 0133 10/26/15 0245 10/27/15 0319  WBC 5.4 7.4  --  4.6 15.4*  --  14.3*  CREATININE 1.18 1.38*  --  1.37*  --  1.55* 1.68*  LATICACIDVEN  --   --  1.52  --   --   --   --     Estimated Creatinine Clearance: 32.7 mL/min (by C-G formula based on Cr of 1.68).    Allergies  Allergen Reactions  . Clopidogrel Bisulfate Hives    REACTION: red blue, black blotches  . Dilaudid [Hydromorphone Hcl] Hives    Hallucinations, little green men working on the building  . Penicillins Hives    Has patient had a PCN reaction causing immediate rash, facial/tongue/throat swelling, SOB or lightheadedness with hypotension: Yes Has patient had a PCN reaction causing severe rash involving mucus membranes or skin necrosis: No Has patient had a PCN reaction that required hospitalization No Has patient had a PCN reaction occurring within the last 10 years: No If all of the above answers are "NO", then may proceed with Cephalosporin use.   . Sulfa Antibiotics Other (See  Comments)    Nightmares, hallucinations  . Sulfonamide Derivatives Other (See Comments)    Nightmares, hallucinations  . Doxycycline Hyclate Rash    Antimicrobials this admission: 3/12 Vancomycin >>   3/12 Aztreonam >>   Dose adjustments this admission: ---  Microbiology results: 3/12 Influenza PCR: neg 3/14 MRSA nasal swab: neg 3/14 Trach asp: IP  Thank you for allowing pharmacy to be a part of this patient's care.  Reuel Boom, PharmD, BCPS Pager: [phone] 10/27/2015, 12:26 PM

## 2015-10-28 ENCOUNTER — Inpatient Hospital Stay (HOSPITAL_COMMUNITY): Payer: Medicare Other

## 2015-10-28 DIAGNOSIS — J810 Acute pulmonary edema: Secondary | ICD-10-CM

## 2015-10-28 LAB — CBC WITH DIFFERENTIAL/PLATELET
Basophils Absolute: 0 10*3/uL (ref 0.0–0.1)
Basophils Relative: 0 %
Eosinophils Absolute: 0 10*3/uL (ref 0.0–0.7)
Eosinophils Relative: 0 %
HEMATOCRIT: 25.5 % — AB (ref 39.0–52.0)
HEMOGLOBIN: 7.8 g/dL — AB (ref 13.0–17.0)
LYMPHS ABS: 0.7 10*3/uL (ref 0.7–4.0)
LYMPHS PCT: 8 %
MCH: 19.2 pg — ABNORMAL LOW (ref 26.0–34.0)
MCHC: 30.6 g/dL (ref 30.0–36.0)
MCV: 62.8 fL — ABNORMAL LOW (ref 78.0–100.0)
MONOS PCT: 3 %
Monocytes Absolute: 0.3 10*3/uL (ref 0.1–1.0)
NEUTROS PCT: 89 %
Neutro Abs: 8 10*3/uL — ABNORMAL HIGH (ref 1.7–7.7)
Platelets: 203 10*3/uL (ref 150–400)
RBC: 4.06 MIL/uL — AB (ref 4.22–5.81)
RDW: 17.1 % — AB (ref 11.5–15.5)
WBC: 9 10*3/uL (ref 4.0–10.5)

## 2015-10-28 LAB — RENAL FUNCTION PANEL
ANION GAP: 6 (ref 5–15)
Albumin: 2.4 g/dL — ABNORMAL LOW (ref 3.5–5.0)
BUN: 34 mg/dL — ABNORMAL HIGH (ref 6–20)
CHLORIDE: 109 mmol/L (ref 101–111)
CO2: 23 mmol/L (ref 22–32)
Calcium: 8.2 mg/dL — ABNORMAL LOW (ref 8.9–10.3)
Creatinine, Ser: 1.38 mg/dL — ABNORMAL HIGH (ref 0.61–1.24)
GFR, EST AFRICAN AMERICAN: 51 mL/min — AB (ref >60)
GFR, EST NON AFRICAN AMERICAN: 44 mL/min — AB (ref >60)
Glucose, Bld: 134 mg/dL — ABNORMAL HIGH (ref 65–99)
POTASSIUM: 4.1 mmol/L (ref 3.5–5.1)
Phosphorus: 3.3 mg/dL (ref 2.5–4.6)
Sodium: 138 mmol/L (ref 135–145)

## 2015-10-28 LAB — CULTURE, RESPIRATORY

## 2015-10-28 LAB — GLUCOSE, CAPILLARY
GLUCOSE-CAPILLARY: 145 mg/dL — AB (ref 65–99)
GLUCOSE-CAPILLARY: 120 mg/dL — AB (ref 65–99)
GLUCOSE-CAPILLARY: 184 mg/dL — AB (ref 65–99)
Glucose-Capillary: 114 mg/dL — ABNORMAL HIGH (ref 65–99)
Glucose-Capillary: 135 mg/dL — ABNORMAL HIGH (ref 65–99)
Glucose-Capillary: 145 mg/dL — ABNORMAL HIGH (ref 65–99)

## 2015-10-28 LAB — CULTURE, RESPIRATORY W GRAM STAIN
Culture: NORMAL
Gram Stain: NONE SEEN

## 2015-10-28 LAB — PROCALCITONIN: Procalcitonin: <0.1 ng/mL

## 2015-10-28 LAB — MAGNESIUM: MAGNESIUM: 2 mg/dL (ref 1.7–2.4)

## 2015-10-28 MED ORDER — CARVEDILOL 12.5 MG PO TABS
12.5000 mg | ORAL_TABLET | Freq: Two times a day (BID) | ORAL | Status: DC
  Administered 2015-10-28 – 2015-11-02 (×10): 12.5 mg via ORAL
  Filled 2015-10-28 (×10): qty 1

## 2015-10-28 MED ORDER — FUROSEMIDE 40 MG PO TABS
40.0000 mg | ORAL_TABLET | Freq: Every day | ORAL | Status: DC
  Administered 2015-10-28 – 2015-10-29 (×2): 40 mg via ORAL
  Filled 2015-10-28 (×2): qty 1

## 2015-10-28 MED ORDER — PANTOPRAZOLE SODIUM 40 MG PO TBEC
40.0000 mg | DELAYED_RELEASE_TABLET | Freq: Every day | ORAL | Status: DC
  Administered 2015-10-28 – 2015-11-02 (×6): 40 mg via ORAL
  Filled 2015-10-28 (×6): qty 1

## 2015-10-28 MED ORDER — LEVOFLOXACIN IN D5W 500 MG/100ML IV SOLN
500.0000 mg | INTRAVENOUS | Status: AC
  Administered 2015-10-28 – 2015-10-30 (×3): 500 mg via INTRAVENOUS
  Filled 2015-10-28 (×3): qty 100

## 2015-10-28 MED ORDER — LOSARTAN POTASSIUM 50 MG PO TABS
25.0000 mg | ORAL_TABLET | Freq: Every day | ORAL | Status: DC
  Administered 2015-10-28 – 2015-11-02 (×6): 25 mg via ORAL
  Filled 2015-10-28 (×7): qty 1

## 2015-10-28 NOTE — Plan of Care (Signed)
Problem: Phase II Progression Outcomes Goal: Time pt extubated/weaned off vent Outcome: Completed/Met Date Met:  10/28/15 0900     

## 2015-10-28 NOTE — Progress Notes (Signed)
Nutrition Follow-up  INTERVENTION:   Diet advancement per MD RD to continue to monitor for needs as diet is advanced  NUTRITION DIAGNOSIS:   Inadequate oral intake related to inability to eat as evidenced by NPO status.  Now on Clear liquid diet.  GOAL:   Patient will meet greater than or equal to 90% of their needs  Not meeting, diet just advanced.  MONITOR:   Vent status, I & O's, Diet advancement, Labs  ASSESSMENT:   [name] is a 82 y.o. male With a past medical history of chronic obstructive pulmonary disease, Stage III chronic disease, hypertension, recently admitted to the medicine service from 10/14/2015 through 10/23/2015; In the last 24 hours he has Developed worsening Shortness of breath associated with cough, having a thick green/yellow sputum production, wheezing, generalized weakness, malaise, poor appetite  Patient extubated 3/15 am. Prior to intubation patient was eating well, 100% of heart healthy diet. Diet has been advanced this am to clear liquids. RD to monitor for tolerance and PO intake.   Medications reviewed.  Labs reviewed: CBGs: 114-145 Mg/Phos WNL  Diet Order:  Diet clear liquid Room service appropriate?: Yes; Fluid consistency:: Thin  Skin:  Wound (see comment) (Ecchymosis, skin tears, to arm and abdomen.)  Last BM:  3/11  Height:   Ht Readings from Last 1 Encounters:  10/26/15 6' (1.829 m)    Weight:   Wt Readings from Last 1 Encounters:  10/24/15 177 lb (80.287 kg)    Ideal Body Weight:  80.9 kg  BMI:  Body mass index is 24 kg/(m^2).  Estimated Nutritional Needs:   Kcal:  1850-2050  Protein:  95-105g  Fluid:  1.8-2L/day  EDUCATION NEEDS:   No education needs identified at this time  Clayton Bibles, MS, RD, LDN Pager: [phone] After Hours Pager: [phone]

## 2015-10-28 NOTE — Plan of Care (Signed)
Problem: Phase II Progression Outcomes Goal: Date pt extubated/weaned off vent Outcome: Completed/Met Date Met:  10/28/15 10/27/2015

## 2015-10-28 NOTE — Progress Notes (Signed)
Wasted 238 mL fentanyl from gtt with [facility]

## 2015-10-28 NOTE — Progress Notes (Signed)
PULMONARY / CRITICAL CARE MEDICINE   Name: [name] MRN: [id number] DOB: 03/19/1924    ADMISSION DATE:  10/24/2015 CONSULTATION DATE:  10/26/2015  REFERRING MD:  Dr. Charlies Silvers  CHIEF COMPLAINT:  Hypercarbic resp fialure  HISTORY OF PRESENT ILLNESS:   82 year old male past medical history as below, which includes hypertension, CAD, COPD(followed by Dr. Melvyn Novas), prostate cancer, OSA, CVA. He was also recently admitted from 3/2 through 3/11 where he was treated for small bowel obstruction and ileus. He improved with conservative management and was able to be discharged home in stable condition. He returned to the emergency department the following day (3/12) complaining of shortness of breath and productive cough (see green/yellow sputum), wheezing, malaise, poor appetite. The emergency department a chest x-ray was obtained which demonstrated bilateral pleural effusions and posterior lower lobe airspace disease concerning for atelectasis versus infection. He was admitted for HCAP to the hospitalist team. He was treated with aztreonam and vancomycin due to penicillin allergy. There is also concern that an exacerbation of known COPD was present. He was started on systemic steroids and scheduled DuoNeb's. At the time of admission it appeared as though his small bowel obstruction remained resolved. 3/13 his condition did not improve, and progressively worsened over the course the day. 3/14 early a.m. he was transferred to the ICU with significant respiratory distress. Profound hypercapnic respiratory failure noted on ABG. PCCM has been asked to evaluate.  SUBJECTIVE:  Pt extubated am 3/15, remains stable from respiratory standpoint - 2L [place].  Word CPAP overnight.  No distress.  Pt reports feeling better, anxious to eat, passing gas.     VITAL SIGNS: BP 157/64 mmHg  Pulse 74  Temp(Src) 98.2 F (36.8 C) (Axillary)  Resp 14  Ht 6' (1.829 m)  Wt 177 lb (80.287 kg)  BMI 24.00 kg/m2  SpO2  100%  HEMODYNAMICS:    VENTILATOR SETTINGS:    INTAKE / OUTPUT: I/O last 3 completed shifts: In: 1543.9 [I.V.:943.9; NG/GT:50; IV Piggyback:550] Out: [id number] [Urine:940; Emesis/NG output:250]  PHYSICAL EXAMINATION: General:  No distress. Sitting up in bed.   Integument:  Warm & dry. No rash on exposed skin.  HEENT:  No scleral injection or icterus.  Cardiovascular:  Regular rate. No edema. No appreciable JVD.  Pulmonary:  Even/non-labored, soft wheezing bilaterally. +Cough, no sputum production Abdomen: Soft. Hypoactive bowel sounds. Nondistended.  Neurological: very HOH, MAE, speech clear Extremities:  RUE > LUE, edema  LABS:  BMET  Recent Labs Lab 10/26/15 0245 10/27/15 0319 10/28/15 0325  NA 140 137 138  K 4.8 4.4 4.1  CL 111 107 109  CO2 21* 21* 23  BUN 26* 34* 34*  CREATININE 1.55* 1.68* 1.38*  GLUCOSE 270* 175* 134*    Electrolytes  Recent Labs Lab 10/26/15 0245 10/27/15 0319 10/28/15 0325  CALCIUM 8.2* 8.3* 8.2*  MG  --  1.9 2.0  PHOS  --  3.1 3.3    CBC  Recent Labs Lab 10/26/15 0133 10/27/15 0319 10/28/15 0325  WBC 15.4* 14.3* 9.0  HGB 9.4* 8.5* 7.8*  HCT 32.0* 27.6* 25.5*  PLT 262 241 203    Coag's No results for input(s): APTT, INR in the last 168 hours.  Sepsis Markers  Recent Labs Lab 10/24/15 1406 10/26/15 0245 10/27/15 0319 10/28/15 0325  LATICACIDVEN 1.52  --   --   --   PROCALCITON  --  <0.10 0.20 <0.10    ABG  Recent Labs Lab 10/26/15 0025 10/26/15 0200 10/26/15 1215  PHART 7.059*  7.153* 7.323*  PCO2ART 78.9* 57.9* 40.1  PO2ART 225* 386* 70.9*    Liver Enzymes  Recent Labs Lab 10/24/15 1358 10/27/15 0319 10/28/15 0325  AST 18  --   --   ALT 12*  --   --   ALKPHOS 56  --   --   BILITOT 0.8  --   --   ALBUMIN 3.1* 2.6* 2.4*    Cardiac Enzymes  Recent Labs Lab 10/26/15 0245  TROPONINI 0.06*    Glucose  Recent Labs Lab 10/27/15 1146 10/27/15 1522 10/27/15 1934 10/28/15 0007  10/28/15 0332 10/28/15 0745  GLUCAP 136* 111* 145* 135* 114* 145*    Imaging Dg Chest Port 1 View  10/28/2015  CLINICAL DATA:  COPD exacerbation.  Short of breath EXAM: PORTABLE CHEST 1 VIEW COMPARISON:  10/26/2015 FINDINGS: Endotracheal tube and NG tubes have been removed. Bilateral airspace disease in the bases left greater than right shows interval improvement. Minimal pleural effusion bilaterally. IMPRESSION: Interval improvement in bilateral airspace disease consistent with clearing edema. Endotracheal tube removed in the interval. Electronically Signed   By: Franchot Gallo M.D.   On: 10/28/2015 07:33     STUDIES:  Port CXR 3/14 >> Mild improvement in bilateral parenchymal opacity on ventilator. Endotracheal tube in satisfactory position. Abd X ray 3/14 >> OG tube in stomach ECHO / TTE 3/14 >> LVEF 40-45%, akinesis of mid-apicalanteroseptal & apical myocardium, grade 1 diastolic dysfunction RUE [place] 3/15 >> neg for DVT, positive for superficial vein thrombosis involving small segment of R cephalic vein above AC fossa   MICROBIOLOGY: Tracheal aspirate 3/14 >> normal flora  MRSA PCR 3/14 >>  Negative   ANTIBIOTICS: Vancomycin 3/12 >> 3/16 Aztreonam 3/12 >> 3/16 Levaquin 3/16 >>  SIGNIFICANT EVENTS: 3/2-3/11 admit for SBO and ileus 3/12  admitted for suspected HCAP + COPD exacerbation 3/14  transfer to ICU & intubation 3/15  Extubated  LINES/TUBES: OETT 8.0 3/14 >> 3/15 OGT 3/14 >> 3/15 FOLEY 3/14 >> 3/15 PIV x3  ASSESSMENT / PLAN:  PULMONARY A: Acute Hypercarbic / Hypoxic Respiratory Failure - Secondary to HCAP, COPD Exacerbation +/- component edema. HCAP COPD Exacerbation (FEV1 45% on 11/13/13) H/O OSA   P:   Nocturnal CPAP  Continue IV solumedrol 40 q 12h, consider reduction of steroids in am 3/17 Duonebs every 4 hours  Hold home Dulera / Spiriva.  Of note, he also had symbicort on med list (should be discharged on either symbicort or dulera + spiriva)  Pulmonary  hygiene - IS Intermittent CXR   CARDIOVASCULAR A:  HTN Chronic systolic/diastolic CHF (LVEF A999333, Grade 1 DD [01/09/2015]) H/O HLD H/O MI Hypotension Post Intubation - Resolved. Due to meds/ + pressure.  P:  Telemetry monitoring Resume home Coreg, Cozaar, lasix 3/16 Continue zocor, ASA  See ECHO results above, unchanged from previous ECHO in 12/2014  RENAL A:   CKD III (Cr at baseline 1.37)   P:   KVO IVF Monitoring renal function w/ daily BUN/Creatinine Correct electrolytes as indicated  GASTROINTESTINAL A:   Recent SBO/Ileus  P:   Advance to clear liquid diet 3/16, monitor for tolerance.   Protonix QD, on prilosec at home  HEMATOLOGIC A:   Anemia (Hbg 7.4 appears near baseline) - Mild. No signs of active bleeding. Thalassemia  RUE Swelling - positive for superficial DVT   P:  Monitor CBC Transfuse per ICU guidelines Enoxaparin for VTE ppx SCDs No IV in RUE with superficial DVT.   ASA daily at baseline  INFECTIOUS  A:   HCAP  P:   ABX as above, narrowed to levaquin (multiple allergies) with stop date in place Cultures as above PCT per algorithm  ENDOCRINE A:   Hyperglycemia - No h/o DM. Likely secondary to steroids.  P:   Accu-Cheks every 4 hour Sliding-scale insulin per algorithm  NEUROLOGIC A:   Acute Encephalopathy - Likely secondary to hypercarbia.  P:   Minimize sedating medications as able  Lyrica bid  FAMILY  - Updates:  No family at bedside am 3/16, patient updated on plan of care.  Transition out to medical telemetry and TRH as of 3/17  - Inter-disciplinary family meet or Palliative Care meeting due by:  3/20   Noe Gens, NP-C [place] [facility] Pgr: [phone] or if no answer [phone] 10/28/2015, 9:40 AM

## 2015-10-29 ENCOUNTER — Inpatient Hospital Stay (HOSPITAL_COMMUNITY): Payer: Medicare Other

## 2015-10-29 DIAGNOSIS — I5043 Acute on chronic combined systolic (congestive) and diastolic (congestive) heart failure: Secondary | ICD-10-CM

## 2015-10-29 DIAGNOSIS — J9601 Acute respiratory failure with hypoxia: Secondary | ICD-10-CM

## 2015-10-29 DIAGNOSIS — J441 Chronic obstructive pulmonary disease with (acute) exacerbation: Secondary | ICD-10-CM

## 2015-10-29 DIAGNOSIS — I10 Essential (primary) hypertension: Secondary | ICD-10-CM

## 2015-10-29 DIAGNOSIS — R609 Edema, unspecified: Secondary | ICD-10-CM

## 2015-10-29 DIAGNOSIS — M79609 Pain in unspecified limb: Secondary | ICD-10-CM

## 2015-10-29 DIAGNOSIS — K567 Ileus, unspecified: Secondary | ICD-10-CM

## 2015-10-29 DIAGNOSIS — I2510 Atherosclerotic heart disease of native coronary artery without angina pectoris: Secondary | ICD-10-CM

## 2015-10-29 LAB — CBC WITH DIFFERENTIAL/PLATELET
BASOS ABS: 0 10*3/uL (ref 0.0–0.1)
Basophils Relative: 0 %
Eosinophils Absolute: 0 10*3/uL (ref 0.0–0.7)
Eosinophils Relative: 0 %
HEMATOCRIT: 25.7 % — AB (ref 39.0–52.0)
HEMOGLOBIN: 8.2 g/dL — AB (ref 13.0–17.0)
LYMPHS PCT: 8 %
Lymphs Abs: 0.6 10*3/uL — ABNORMAL LOW (ref 0.7–4.0)
MCH: 19.1 pg — ABNORMAL LOW (ref 26.0–34.0)
MCHC: 31.9 g/dL (ref 30.0–36.0)
MCV: 59.8 fL — ABNORMAL LOW (ref 78.0–100.0)
MONOS PCT: 3 %
Monocytes Absolute: 0.2 10*3/uL (ref 0.1–1.0)
NEUTROS ABS: 7.1 10*3/uL (ref 1.7–7.7)
Neutrophils Relative %: 89 %
Platelets: 221 10*3/uL (ref 150–400)
RBC: 4.3 MIL/uL (ref 4.22–5.81)
RDW: 17.1 % — ABNORMAL HIGH (ref 11.5–15.5)
WBC: 7.9 10*3/uL (ref 4.0–10.5)

## 2015-10-29 LAB — RENAL FUNCTION PANEL
Albumin: 2.5 g/dL — ABNORMAL LOW (ref 3.5–5.0)
Anion gap: 10 (ref 5–15)
BUN: 39 mg/dL — AB (ref 6–20)
CHLORIDE: 108 mmol/L (ref 101–111)
CO2: 23 mmol/L (ref 22–32)
Calcium: 8.4 mg/dL — ABNORMAL LOW (ref 8.9–10.3)
Creatinine, Ser: 1.32 mg/dL — ABNORMAL HIGH (ref 0.61–1.24)
GFR calc Af Amer: 53 mL/min — ABNORMAL LOW (ref >60)
GFR calc non Af Amer: 46 mL/min — ABNORMAL LOW (ref >60)
GLUCOSE: 197 mg/dL — AB (ref 65–99)
POTASSIUM: 4 mmol/L (ref 3.5–5.1)
Phosphorus: 3.9 mg/dL (ref 2.5–4.6)
Sodium: 141 mmol/L (ref 135–145)

## 2015-10-29 LAB — GLUCOSE, CAPILLARY
GLUCOSE-CAPILLARY: 145 mg/dL — AB (ref 65–99)
GLUCOSE-CAPILLARY: 135 mg/dL — AB (ref 65–99)
GLUCOSE-CAPILLARY: 139 mg/dL — AB (ref 65–99)
Glucose-Capillary: 173 mg/dL — ABNORMAL HIGH (ref 65–99)
Glucose-Capillary: 145 mg/dL — ABNORMAL HIGH (ref 65–99)

## 2015-10-29 LAB — MAGNESIUM: Magnesium: 2.2 mg/dL (ref 1.7–2.4)

## 2015-10-29 MED ORDER — POLYETHYLENE GLYCOL 3350 17 G PO PACK: 17.0000 g | PACK | Freq: Two times a day (BID) | ORAL | Status: DC

## 2015-10-29 MED ORDER — INSULIN ASPART 100 UNIT/ML ~~LOC~~ SOLN: 0.0000 [IU] | Freq: Every day | SUBCUTANEOUS | Status: DC

## 2015-10-29 MED ORDER — ALBUTEROL SULFATE (2.5 MG/3ML) 0.083% IN NEBU: 2.5000 mg | INHALATION_SOLUTION | RESPIRATORY_TRACT | Status: DC | PRN

## 2015-10-29 MED ORDER — SENNOSIDES-DOCUSATE SODIUM 8.6-50 MG PO TABS: 1.0000 | ORAL_TABLET | Freq: Two times a day (BID) | ORAL | Status: DC

## 2015-10-29 MED ORDER — IPRATROPIUM-ALBUTEROL 0.5-2.5 (3) MG/3ML IN SOLN
3.0000 mL | Freq: Four times a day (QID) | RESPIRATORY_TRACT | Status: DC
  Administered 2015-10-30 (×4): 3 mL via RESPIRATORY_TRACT
  Filled 2015-10-29 (×4): qty 3

## 2015-10-29 MED ORDER — INSULIN ASPART 100 UNIT/ML ~~LOC~~ SOLN
0.0000 [IU] | Freq: Three times a day (TID) | SUBCUTANEOUS | Status: DC
  Administered 2015-10-30 – 2015-10-31 (×4): 2 [IU] via SUBCUTANEOUS
  Administered 2015-10-31: 1 [IU] via SUBCUTANEOUS
  Administered 2015-10-31: 2 [IU] via SUBCUTANEOUS
  Administered 2015-11-01: 3 [IU] via SUBCUTANEOUS
  Administered 2015-11-01: 1 [IU] via SUBCUTANEOUS
  Administered 2015-11-01: 2 [IU] via SUBCUTANEOUS
  Administered 2015-11-02: 3 [IU] via SUBCUTANEOUS

## 2015-10-29 MED ORDER — METOCLOPRAMIDE HCL 5 MG/ML IJ SOLN
10.0000 mg | Freq: Three times a day (TID) | INTRAMUSCULAR | Status: DC
  Administered 2015-10-29 – 2015-11-02 (×12): 10 mg via INTRAVENOUS
  Filled 2015-10-29 (×12): qty 2

## 2015-10-29 MED ORDER — DIPHENHYDRAMINE HCL 25 MG PO CAPS
25.0000 mg | ORAL_CAPSULE | Freq: Once | ORAL | Status: AC
  Administered 2015-10-29: 25 mg via ORAL
  Filled 2015-10-29: qty 1

## 2015-10-29 NOTE — Consult Note (Signed)
Reason for Consult:  SBO vs ileus Referring Physician: Dr. Marlowe Sax  [name] is an 82 y.o. male.  HPI: Pt was first admitted on 10/15/15 with nausea, vomiting, unable to void.  He had multiple episodes of nausea/vomiting/diarrhea 2 days before onset of current symptoms.   Admit labs showed creatinine of 2.15. Initial 3 way film showed SBO with SB up to 5.8 cm, consistent with SBO.  Chest film was unremarkable.   CT at that time showed:  The small bowel is unremarkable in appearance. The stomach is within normal limits. The colon is largely filled with fluid, corresponding to the patient's diarrhea. Film the following days shows contrast in the colon, but still fluid in the SB.  He was treated as an ileus, possible secondary to gastroenteritis.  He was treated later with Reglan 10/20/15.  He went home on 10/23/15, but returned on 10/24/15 and admitted with cough and SOB.  Possible HCAP vs COPD exacerbation.  Respiratory symptoms persisted and he was intubated on 10/26/15. He was extubated on 10/27/15, and transferred to the floor 10/28/15.  Dr. Lake Bells thinks this was a combination of COPD exacerbation, HCAP AND acute pulmonary edema. Dr. Posey Pronto is seeing him for the first time today he is still distended.Films show slight worsening of partial small bowel obstruction versus ileus.  Last BM reported on 10/23/15. Currently afebrile, VSS.  Labs this AM show K+ 4.0, creatinine is still up some, mild anemia, Glucose elevation.  Low albumin 2.5.  Past Medical History  Diagnosis Date  . Carotid artery stenosis   . Essential hypertension   . Myocardial infarction ([place]) 1979  . Coronary artery disease     Known occluded proximal LAD with collaterals, moderate RCA disease - managed medically 2009  . COPD (chronic obstructive pulmonary disease) ([place])   . History of stroke   . Prostate cancer ([place])   . Arthritis   . Foot drop, right   . Neuropathy ([place])   . Anemia   . Urinary incontinence, male, stress   . GERD  (gastroesophageal reflux disease)   . Thalassemia   . Wears dentures   . Deafness in left ear     Wears hearing aide in right ear  . Sleep apnea     CPAP  . Ischemic cardiomyopathy     LVEF 40-45%  . Hypercholesteremia   . Anemia   . Osteoarthritis   . Stroke ([place])   . Heart attack ([place])   . Vertigo   . Hereditary and idiopathic peripheral neuropathy 03/01/2015  . Abnormality of gait 03/01/2015    Past Surgical History  Procedure Laterality Date  . Penile prosthesis implant    . Cholecystectomy    . Right carotid endarterectomy    . Back surgery    . Lumbar laminectomy  03/24/10    L3-4 and L4-5  . Ankle surgery      Bone infection  . Cardiac catheterization    . Appendectomy    . Joint replacement Left   . Radioactive seed implant  2009  . Pars plana vitrectomy  03/19/2012    Procedure: PARS PLANA VITRECTOMY WITH 25 GAUGE;  Surgeon: Hayden Pedro, MD;  Location: [facility];  Service: Ophthalmology;  Laterality: Right;  . Carotid endarterectomy Right     CEA - 15 yrs ago  . Spine surgery    . Eye surgery    . Cataract extraction w/ intraocular lens implant Left   . Endarterectomy Left 09/28/2014  Procedure: Left Carotid Endarterectomy;  Surgeon: Rosetta Posner, MD;  Location: [facility];  Service: Vascular;  Laterality: Left;  . Patch angioplasty Left 09/28/2014    Procedure: WITH DACRON PATCH ANGIOPLASTY;  Surgeon: Rosetta Posner, MD;  Location: [facility] OR;  Service: Vascular;  Laterality: Left;    Family History  Problem Relation Age of Onset  . Thalassemia      Family history  . Cancer Mother 60    Bone cancer  . Diabetes Mother   . Heart disease Mother   . Heart attack Mother   . Hypertension Mother   . Stroke Father 38  . Heart disease Father   . Hypertension Father   . Hypertension Sister   . Diabetes Sister   . Heart disease Brother   . Heart attack Brother 5  . Cancer Brother     Social History:  reports that he quit smoking about 38 years ago. His smoking use  included Cigarettes. He has a 20 pack-year smoking history. He has never used smokeless tobacco. He reports that he does not drink alcohol or use illicit drugs.  Allergies:  Allergies  Allergen Reactions  . Clopidogrel Bisulfate Hives    REACTION: red blue, black blotches  . Dilaudid [Hydromorphone Hcl] Hives    Hallucinations, little green men working on the building  . Penicillins Hives    Has patient had a PCN reaction causing immediate rash, facial/tongue/throat swelling, SOB or lightheadedness with hypotension: Yes Has patient had a PCN reaction causing severe rash involving mucus membranes or skin necrosis: No Has patient had a PCN reaction that required hospitalization No Has patient had a PCN reaction occurring within the last 10 years: No If all of the above answers are "NO", then may proceed with Cephalosporin use.   . Sulfa Antibiotics Other (See Comments)    Nightmares, hallucinations  . Sulfonamide Derivatives Other (See Comments)    Nightmares, hallucinations  . Doxycycline Hyclate Rash    Medications:  Prior to Admission:  Prescriptions prior to admission  Medication Sig Dispense Refill Last Dose  . acetaminophen (TYLENOL) 325 MG tablet Take 650 mg by mouth every 6 (six) hours as needed for mild pain.    Past Week at Unknown time  . aspirin EC 81 MG tablet Take 81 mg by mouth daily.   10/24/2015 at Unknown time  . carvedilol (COREG) 12.5 MG tablet TAKE 1 TABLET TWICE DAILY WITH A MEAL 60 tablet 11 10/24/2015 at 0800  . furosemide (LASIX) 20 MG tablet Take 40 mg every morning 90 tablet 3 10/24/2015 at Unknown time  . Glycopyrrolate-Formoterol (BEVESPI AEROSPHERE) 9-4.8 MCG/ACT AERO Inhale 2 puffs into the lungs every 12 (twelve) hours.   10/24/2015 at am  . LINZESS 145 MCG CAPS capsule Take 1 capsule by mouth daily with breakfast.  11 10/24/2015 at Unknown time  . losartan (COZAAR) 25 MG tablet Take 1 tablet (25 mg total) by mouth daily. 30 tablet 0 10/24/2015 at Unknown time   . meclizine (ANTIVERT) 25 MG tablet Take 1 tablet (25 mg total) by mouth daily as needed for dizziness. 30 tablet 6 unknown  . mometasone-formoterol (DULERA) 200-5 MCG/ACT AERO Inhale 2 puffs into the lungs 2 (two) times daily. 1 Inhaler 5 10/24/2015 at am  . MYRBETRIQ 50 MG TB24 tablet Take 50 mg by mouth daily.  11 10/24/2015 at 0800  . NITROSTAT 0.4 MG SL tablet PLACE 1 TABLET (0.4 MG TOTAL) UNDER THE TONGUE EVERY 5 (FIVE) MINUTES AS NEEDED. FOR  CHEST PAIN 25 tablet 3 unknown  . omeprazole (PRILOSEC) 40 MG capsule Take 1 capsule (40 mg total) by mouth daily. 90 capsule 3 10/24/2015 at Unknown time  . polyethylene glycol powder (MIRALAX) powder Take 17 g by mouth daily as needed for mild constipation.    Past Month at Unknown time  . pregabalin (LYRICA) 75 MG capsule Take 75 mg by mouth 2 (two) times daily.    10/24/2015 at 0800  . simvastatin (ZOCOR) 10 MG tablet Take 10 mg by mouth daily at 6 PM.    10/23/2015 at Unknown time  . SYMBICORT 160-4.5 MCG/ACT inhaler INHALE 2 PUFFS FIRST THING IN THE MORNING AND THEN TAKE 2 PUFFS 12 HOURS LATER 10.2 Inhaler 5 10/24/2015 at Unknown time  . tiotropium (SPIRIVA) 18 MCG inhalation capsule Place 1 capsule (18 mcg total) into inhaler and inhale daily. 30 capsule 0 10/24/2015 at Unknown time  . VENTOLIN HFA 108 (90 BASE) MCG/ACT inhaler INHALE 2 PUFFS INTO THE LUNGS EVERY 4 HOURS AS NEEDED FOR WHEEZING OR SHORTNESS OF BREATH (Patient taking differently: INHALE 2 PUFFS INTO THE LUNGS daily.) 1 Inhaler 0 10/24/2015 at Unknown time  . vitamin C (ASCORBIC ACID) 500 MG tablet Take 500 mg by mouth daily.   10/24/2015 at Unknown time   Scheduled: . antiseptic oral rinse  7 mL Mouth Rinse q12n4p  . aspirin  81 mg Per Tube Daily  . carvedilol  12.5 mg Oral BID WC  . chlorhexidine  15 mL Mouth Rinse BID  . enoxaparin (LOVENOX) injection  40 mg Subcutaneous Q24H  . insulin aspart  2-6 Units Subcutaneous 6 times per day  . ipratropium-albuterol  3 mL Nebulization Q4H  .  levofloxacin (LEVAQUIN) IV  500 mg Intravenous Q24H  . losartan  25 mg Oral Daily  . methylPREDNISolone (SOLU-MEDROL) injection  40 mg Intravenous Q12H  . pantoprazole  40 mg Oral Q1200  . pregabalin  75 mg Per Tube BID  . simvastatin  10 mg Per Tube q1800  . sodium chloride flush  3 mL Intravenous Q12H   Continuous: . sodium chloride 10 mL/hr at 10/28/15 1003   NUU:[id number] **OR** acetaminophen, alum & mag hydroxide-simeth, ipratropium-albuterol, ondansetron **OR** ondansetron (ZOFRAN) IV Anti-infectives    Start     Dose/Rate Route Frequency Ordered Stop   10/28/15 1200  levofloxacin (LEVAQUIN) IVPB 500 mg     500 mg 100 mL/hr over 60 Minutes Intravenous Every 24 hours 10/28/15 1005 10/31/15 1159   10/25/15 1600  vancomycin (VANCOCIN) IVPB 1000 mg/200 mL premix  Status:  Discontinued     1,000 mg 200 mL/hr over 60 Minutes Intravenous Every 24 hours 10/24/15 1438 10/28/15 1005   10/24/15 2200  aztreonam (AZACTAM) 1 g in dextrose 5 % 50 mL IVPB  Status:  Discontinued     1 g 100 mL/hr over 30 Minutes Intravenous 3 times per day 10/24/15 1438 10/28/15 1005   10/24/15 1715  aztreonam (AZACTAM) injection 1 g  Status:  Discontinued     1 g Intramuscular 3 times per day 10/24/15 1713 10/24/15 1719   10/24/15 1445  aztreonam (AZACTAM) 1 g in dextrose 5 % 50 mL IVPB     1 g 100 mL/hr over 30 Minutes Intravenous NOW 10/24/15 1432 10/24/15 1524   10/24/15 1445  vancomycin (VANCOCIN) IVPB 1000 mg/200 mL premix     1,000 mg 200 mL/hr over 60 Minutes Intravenous NOW 10/24/15 1432 10/24/15 1624      Results for orders placed or performed  during the hospital encounter of 10/24/15 (from the past 48 hour(s))  Vancomycin, trough     Status: None   Collection Time: 10/27/15  2:46 PM  Result Value Ref Range   Vancomycin Tr 14 10.0 - 20.0 ug/mL  Glucose, capillary     Status: Abnormal   Collection Time: 10/27/15  3:22 PM  Result Value Ref Range   Glucose-Capillary 111 (H) 65 - 99 mg/dL   Glucose, capillary     Status: Abnormal   Collection Time: 10/27/15  7:34 PM  Result Value Ref Range   Glucose-Capillary 145 (H) 65 - 99 mg/dL   Comment 1 Notify RN   Glucose, capillary     Status: Abnormal   Collection Time: 10/28/15 12:07 AM  Result Value Ref Range   Glucose-Capillary 135 (H) 65 - 99 mg/dL   Comment 1 Notify RN   Procalcitonin     Status: None   Collection Time: 10/28/15  3:25 AM  Result Value Ref Range   Procalcitonin <0.10 ng/mL    Comment:        Interpretation: PCT (Procalcitonin) <= 0.5 ng/mL: Systemic infection (sepsis) is not likely. Local bacterial infection is possible. (NOTE)         ICU PCT Algorithm               Non ICU PCT Algorithm    ----------------------------     ------------------------------         PCT < 0.25 ng/mL                 PCT < 0.1 ng/mL     Stopping of antibiotics            Stopping of antibiotics       strongly encouraged.               strongly encouraged.    ----------------------------     ------------------------------       PCT level decrease by               PCT < 0.25 ng/mL       >= 80% from peak PCT       OR PCT 0.25 - 0.5 ng/mL          Stopping of antibiotics                                             encouraged.     Stopping of antibiotics           encouraged.    ----------------------------     ------------------------------       PCT level decrease by              PCT >= 0.25 ng/mL       < 80% from peak PCT        AND PCT >= 0.5 ng/mL            Continuin g antibiotics                                              encouraged.       Continuing antibiotics            encouraged.    ----------------------------     ------------------------------  PCT level increase compared          PCT > 0.5 ng/mL         with peak PCT AND          PCT >= 0.5 ng/mL             Escalation of antibiotics                                          strongly encouraged.      Escalation of antibiotics        strongly  encouraged.   Renal function panel     Status: Abnormal   Collection Time: 10/28/15  3:25 AM  Result Value Ref Range   Sodium 138 135 - 145 mmol/L   Potassium 4.1 3.5 - 5.1 mmol/L   Chloride 109 101 - 111 mmol/L   CO2 23 22 - 32 mmol/L   Glucose, Bld 134 (H) 65 - 99 mg/dL   BUN 34 (H) 6 - 20 mg/dL   Creatinine, Ser 1.38 (H) 0.61 - 1.24 mg/dL   Calcium 8.2 (L) 8.9 - 10.3 mg/dL   Phosphorus 3.3 2.5 - 4.6 mg/dL   Albumin 2.4 (L) 3.5 - 5.0 g/dL   GFR calc non Af Amer 44 (L) >60 mL/min   GFR calc Af Amer 51 (L) >60 mL/min    Comment: (NOTE) The eGFR has been calculated using the CKD EPI equation. This calculation has not been validated in all clinical situations. eGFR's persistently <60 mL/min signify possible Chronic Kidney Disease.    Anion gap 6 5 - 15  Magnesium     Status: None   Collection Time: 10/28/15  3:25 AM  Result Value Ref Range   Magnesium 2.0 1.7 - 2.4 mg/dL  CBC with Differential/Platelet     Status: Abnormal   Collection Time: 10/28/15  3:25 AM  Result Value Ref Range   WBC 9.0 4.0 - 10.5 K/uL   RBC 4.06 (L) 4.22 - 5.81 MIL/uL   Hemoglobin 7.8 (L) 13.0 - 17.0 g/dL   HCT 25.5 (L) 39.0 - 52.0 %   MCV 62.8 (L) 78.0 - 100.0 fL   MCH 19.2 (L) 26.0 - 34.0 pg   MCHC 30.6 30.0 - 36.0 g/dL   RDW 17.1 (H) 11.5 - 15.5 %   Platelets 203 150 - 400 K/uL   Neutrophils Relative % 89 %   Lymphocytes Relative 8 %   Monocytes Relative 3 %   Eosinophils Relative 0 %   Basophils Relative 0 %   Neutro Abs 8.0 (H) 1.7 - 7.7 K/uL   Lymphs Abs 0.7 0.7 - 4.0 K/uL   Monocytes Absolute 0.3 0.1 - 1.0 K/uL   Eosinophils Absolute 0.0 0.0 - 0.7 K/uL   Basophils Absolute 0.0 0.0 - 0.1 K/uL   RBC Morphology POLYCHROMASIA PRESENT     Comment: ELLIPTOCYTES  Glucose, capillary     Status: Abnormal   Collection Time: 10/28/15  3:32 AM  Result Value Ref Range   Glucose-Capillary 114 (H) 65 - 99 mg/dL  Glucose, capillary     Status: Abnormal   Collection Time: 10/28/15  7:45 AM  Result  Value Ref Range   Glucose-Capillary 145 (H) 65 - 99 mg/dL  Glucose, capillary     Status: Abnormal   Collection Time: 10/28/15 11:41 AM  Result Value Ref Range   Glucose-Capillary 145 (  H) 65 - 99 mg/dL  Glucose, capillary     Status: Abnormal   Collection Time: 10/28/15  3:50 PM  Result Value Ref Range   Glucose-Capillary 120 (H) 65 - 99 mg/dL  Glucose, capillary     Status: Abnormal   Collection Time: 10/28/15 11:16 PM  Result Value Ref Range   Glucose-Capillary 184 (H) 65 - 99 mg/dL  Glucose, capillary     Status: Abnormal   Collection Time: 10/29/15  4:07 AM  Result Value Ref Range   Glucose-Capillary 173 (H) 65 - 99 mg/dL  Renal function panel     Status: Abnormal   Collection Time: 10/29/15  4:24 AM  Result Value Ref Range   Sodium 141 135 - 145 mmol/L   Potassium 4.0 3.5 - 5.1 mmol/L   Chloride 108 101 - 111 mmol/L   CO2 23 22 - 32 mmol/L   Glucose, Bld 197 (H) 65 - 99 mg/dL   BUN 39 (H) 6 - 20 mg/dL   Creatinine, Ser 8.70 (H) 0.61 - 1.24 mg/dL   Calcium 8.4 (L) 8.9 - 10.3 mg/dL   Phosphorus 3.9 2.5 - 4.6 mg/dL   Albumin 2.5 (L) 3.5 - 5.0 g/dL   GFR calc non Af Amer 46 (L) >60 mL/min   GFR calc Af Amer 53 (L) >60 mL/min    Comment: (NOTE) The eGFR has been calculated using the CKD EPI equation. This calculation has not been validated in all clinical situations. eGFR's persistently <60 mL/min signify possible Chronic Kidney Disease.    Anion gap 10 5 - 15  Magnesium     Status: None   Collection Time: 10/29/15  4:24 AM  Result Value Ref Range   Magnesium 2.2 1.7 - 2.4 mg/dL  CBC with Differential/Platelet     Status: Abnormal   Collection Time: 10/29/15  4:24 AM  Result Value Ref Range   WBC 7.9 4.0 - 10.5 K/uL   RBC 4.30 4.22 - 5.81 MIL/uL   Hemoglobin 8.2 (L) 13.0 - 17.0 g/dL   HCT 92.9 (L) 41.6 - 50.8 %   MCV 59.8 (L) 78.0 - 100.0 fL   MCH 19.1 (L) 26.0 - 34.0 pg   MCHC 31.9 30.0 - 36.0 g/dL   RDW 13.6 (H) 53.1 - 13.9 %   Platelets 221 150 - 400 K/uL    Neutrophils Relative % 89 %   Lymphocytes Relative 8 %   Monocytes Relative 3 %   Eosinophils Relative 0 %   Basophils Relative 0 %   Neutro Abs 7.1 1.7 - 7.7 K/uL   Lymphs Abs 0.6 (L) 0.7 - 4.0 K/uL   Monocytes Absolute 0.2 0.1 - 1.0 K/uL   Eosinophils Absolute 0.0 0.0 - 0.7 K/uL   Basophils Absolute 0.0 0.0 - 0.1 K/uL   RBC Morphology ELLIPTOCYTES     Comment: TEARDROP CELLS POLYCHROMASIA PRESENT   Glucose, capillary     Status: Abnormal   Collection Time: 10/29/15  8:44 AM  Result Value Ref Range   Glucose-Capillary 145 (H) 65 - 99 mg/dL    Dg Chest Port 1 View  10/28/2015  CLINICAL DATA:  COPD exacerbation.  Short of breath EXAM: PORTABLE CHEST 1 VIEW COMPARISON:  10/26/2015 FINDINGS: Endotracheal tube and NG tubes have been removed. Bilateral airspace disease in the bases left greater than right shows interval improvement. Minimal pleural effusion bilaterally. IMPRESSION: Interval improvement in bilateral airspace disease consistent with clearing edema. Endotracheal tube removed in the interval. Electronically Signed   By:  Franchot Gallo M.D.   On: 10/28/2015 07:33   Dg Abd 2 Views  10/29/2015  CLINICAL DATA:  Partial small bowel obstruction. EXAM: ABDOMEN - 2 VIEW COMPARISON:  10/26/2015 FINDINGS: Nasogastric tube no longer visualized. Gaseous dilatation of small bowel remains with some air also present throughout most of the colon. Dilatation of small bowel slightly worse compared to 3/14. Findings remain consistent with partial small bowel obstruction versus generalized ileus. Decubitus film shows no evidence of free intraperitoneal air. No abnormal calcifications identified. IMPRESSION: Slight worsening of partial small bowel obstruction versus ileus. Electronically Signed   By: Aletta Edouard M.D.   On: 10/29/2015 11:39    Review of Systems  Constitutional: Negative for fever and chills.       Last real meal about 1 week ago, last BM recorded 3/11, before he came back to the  ED on 10/24/15.  HENT: Positive for hearing loss (hearing aides lost in [facility] last admit).   Eyes: Negative.   Cardiovascular: Negative.   Gastrointestinal: Positive for constipation. Negative for heartburn, nausea, vomiting, abdominal pain and diarrhea.  Genitourinary: Negative.   Musculoskeletal: Negative.   Skin: Negative.   Neurological: Negative.        Walking limited secondary to bilateral neuropathy  Endo/Heme/Allergies: Negative.   Psychiatric/Behavioral: Negative.    Blood pressure 139/61, pulse 68, temperature 98 F (36.7 C), temperature source Oral, resp. rate 15, height 6' (1.829 m), weight 80.287 kg (177 lb), SpO2 95 %. Physical Exam  Constitutional: He is oriented to person, place, and time. He appears well-developed and well-nourished. No distress.  HENT:  Head: Normocephalic and atraumatic.  Eyes: Right eye exhibits no discharge. Left eye exhibits no discharge. No scleral icterus.  Neck: Normal range of motion. Neck supple. No JVD present. No tracheal deviation present. No thyromegaly present.  Cardiovascular: Normal rate, regular rhythm and normal heart sounds.   Decreased pulses bilat lower legs  Respiratory: Effort normal and breath sounds normal. No respiratory distress. He has no wheezes. He has no rales. He exhibits no tenderness.  GI: Soft. Bowel sounds are normal. He exhibits distension (some distension, but he is not uncomfortable). He exhibits no mass. There is no tenderness. There is no rebound and no guarding.  RUQ scar, from midline to lateral side Appendectomy scar   Genitourinary:  Condom cath  Musculoskeletal: He exhibits no edema or tenderness.  Lymphadenopathy:    He has no cervical adenopathy.  Neurological: He is alert and oriented to person, place, and time. No cranial nerve deficit.  Skin: Skin is warm and dry. No rash noted. He is not diaphoretic. No erythema. No pallor.  Psychiatric: He has a normal mood and affect. His behavior is normal.  Judgment and thought content normal.    Assessment/Plan: Ileus Hospitalize from 3/7-3/11/17 with Nausea, vomiting, possible gastroenteritis, diarrhea, followed by constipation.   Prior open cholecystectomy, appendectomy and prostate seed implants Acute respiratory failure/COPD/HCAP/interstitial edema OSA on CPAP at night Chronic kidney disease stage III Anemia/hx of thalassemia Hyperglycemia Neuropathy, both lower legs, with limited ability to ambulate Hx of MI/Ischemic CM EF 40-45%/CAD Hx of CVA Hx of prostate cancer Bilateral carotid endarterectomies  Hard of hearing  Plan:  He is passing flatus and is not having pain, nausea or vomiting.  He really needs to move and that is going to be hard.  I would work on mobilizing and you can consider Reglan again.  That is what they used at [facility]  Last admit.  If distension  gets worse, if he has nausea or vomiting I would place NG.  Nothing surgical to fix that we can see at this point.Marland Kitchen  [name] 10/29/2015, 12:53 PM

## 2015-10-29 NOTE — Progress Notes (Signed)
*  PRELIMINARY RESULTS* Vascular Ultrasound Lower extremity venous duplex has been completed.  Preliminary findings: No evidence of DVT or baker's cyst.   Landry Mellow, RDMS, RVT  10/29/2015, 1:57 PM

## 2015-10-29 NOTE — Progress Notes (Signed)
[facility] Progress Note  Patient: [name] [id number]   PCP:  Melinda Crutch, MD DOB: [birth year]   DOA: 10/24/2015   DOS: 10/29/2015   Date of Service: the patient was seen and examined on 10/29/2015  Subjective: Patient continues to complain of nausea without any vomiting. Patient did not have any bowel movement but has been passing gas. The breathing has been about the same. The cough is been also about the same. No chest pain. Abdominal pain. Nutrition: Clear liquid diet.  Brief hospital course: Patient was admitted on 10/24/2015, with complaint of shortness of breath, was found to have healthcare associated pneumonia with acute hypoxic respiratory failure. Patient was on broad-spectrum antibiotics and while he was not improving he was transferred to ICU with respiratory distress, intubated and 10/26/2015 and extubated the next day on 10/27/2015. Transferred to telemetry on 10/28/2015 Currently further plan is continue with the IV antibiotics, continue with the steroids and breathing treatment.  Assessment and Plan: 1. Acute respiratory failure with hypoxia ([place]) Healthcare associated pneumonia and COPD exacerbation Acute on chronic combined CHF. Currently continue IV Solu-Medrol, IV Levaquin, duo nebs, flutter device. Follow cultures. Holding further doses of Lasix since the patient has mild worsening of his renal function.  2. Ileus. Patient had not had any bowel movement since 10/23/2015. We will use IV Reglan as per recommendation from neurosurgery. Appreciated peripheral general surgery. We'll try to identify cause to avoid reoccurrence.  3. Obstructive sleep apnea. Continue CPAP.  4. Essential hypertension EF 40-45%. Continue Coreg and Cozaar. Currently holding Lasix. Continue Zocor and aspirin.  5. Chronic disease stage III. Mild renal function worsening. Would recheck in the morning.  6. Pedal edema. Lower extended Doppler negative for DVT. Continue  monitoring Encourage ambulation.  7. Chronic anemia. No evidence of acute worsening. We'll continue to closely monitor.  Activity: physical therapy ordered Bowel regimen: last BM 10/23/2015 DVT Prophylaxis: subcutaneous Heparin Nutrition: Clear liquid diet Advance goals of care discussion: Full code  HPI: As per the H and P dictated on admission, "[name] is a 82 y.o. male With a past medical history of chronic obstructive pulmonary disease, Stage III chronic disease, hypertension, recently admitted to the medicine service from 10/14/2015 through 10/23/2015 where He was treated for small bowel obstruction/ileus. He showed clinical improvement with advancement of diet and was discharged home in stable condition. In the last 24 hours he has Developed worsening Shortness of breath associated with cough, having a thick green/yellow sputum production, wheezing, generalized weakness, malaise, poor appetite. A chest x-ray performed in the emergency department revealed presence of lower lobe airspace disease concerning for healthcare associated pneumonia." Procedures: Intubation, echocardiogram Consultants: Critical care , general surgery Antibiotics: Anti-infectives    Start     Dose/Rate Route Frequency Ordered Stop   10/28/15 1200  levofloxacin (LEVAQUIN) IVPB 500 mg     500 mg 100 mL/hr over 60 Minutes Intravenous Every 24 hours 10/28/15 1005 10/31/15 1159   10/25/15 1600  vancomycin (VANCOCIN) IVPB 1000 mg/200 mL premix  Status:  Discontinued     1,000 mg 200 mL/hr over 60 Minutes Intravenous Every 24 hours 10/24/15 1438 10/28/15 1005   10/24/15 2200  aztreonam (AZACTAM) 1 g in dextrose 5 % 50 mL IVPB  Status:  Discontinued     1 g 100 mL/hr over 30 Minutes Intravenous 3 times per day 10/24/15 1438 10/28/15 1005   10/24/15 1715  aztreonam (AZACTAM) injection 1 g  Status:  Discontinued  1 g Intramuscular 3 times per day 10/24/15 1713 10/24/15 1719   10/24/15 1445  aztreonam  (AZACTAM) 1 g in dextrose 5 % 50 mL IVPB     1 g 100 mL/hr over 30 Minutes Intravenous NOW 10/24/15 1432 10/24/15 1524   10/24/15 1445  vancomycin (VANCOCIN) IVPB 1000 mg/200 mL premix     1,000 mg 200 mL/hr over 60 Minutes Intravenous NOW 10/24/15 1432 10/24/15 1624       Family Communication: no family was present at bedside, at the time of interview.   Disposition:  Expected discharge date:11/01/2015 Barriers to safe discharge: Improvement in respiratory status and ileus   Intake/Output Summary (Last 24 hours) at 10/29/15 1829 Last data filed at 10/29/15 1400  Gross per 24 hour  Intake   1060 ml  Output   2800 ml  Net  -1740 ml   Filed Weights   10/24/15 1650  Weight: 80.287 kg (177 lb)    Objective: Physical Exam: Filed Vitals:   10/29/15 0805 10/29/15 1220 10/29/15 1400 10/29/15 1731  BP: 139/61  132/51   Pulse: 68  65   Temp: 98 F (36.7 C)  98.3 F (36.8 C)   TempSrc: Oral  Oral   Resp: 15  18   Height:      Weight:      SpO2: 97% 95%  96%     General: Appear in moderate distress, no Rash; Oral Mucosa moist. Cardiovascular: S1 and S2 Present, no Murmur, no JVD Respiratory: Bilateral Air entry present and basal Crackles, no wheezes Abdomen: Bowel Sound present, Soft and no tenderness Extremities: bilateral Pedal edema, bilateral calf tenderness Neurology: Grossly no focal neuro deficit.  Data Reviewed: CBC:  Recent Labs Lab 10/24/15 1358 10/25/15 0439 10/26/15 0133 10/27/15 0319 10/28/15 0325 10/29/15 0424  WBC 7.4 4.6 15.4* 14.3* 9.0 7.9  NEUTROABS 5.6  --   --  13.3* 8.0* 7.1  HGB 7.9* 7.4* 9.4* 8.5* 7.8* 8.2*  HCT 25.5* 24.6* 32.0* 27.6* 25.5* 25.7*  MCV 61.2* 63.4* 64.5* 60.5* 62.8* 59.8*  PLT 186 187 262 241 203 [id number]   Basic Metabolic Panel:  Recent Labs Lab 10/25/15 0439 10/26/15 0245 10/27/15 0319 10/28/15 0325 10/29/15 0424  NA 140 140 137 138 141  K 4.2 4.8 4.4 4.1 4.0  CL 110 111 107 109 108  CO2 21* 21* 21* 23 23   GLUCOSE 201* 270* 175* 134* 197*  BUN 22* 26* 34* 34* 39*  CREATININE 1.37* 1.55* 1.68* 1.38* 1.32*  CALCIUM 8.1* 8.2* 8.3* 8.2* 8.4*  MG  --   --  1.9 2.0 2.2  PHOS  --   --  3.1 3.3 3.9   Liver Function Tests:  Recent Labs Lab 10/24/15 1358 10/27/15 0319 10/28/15 0325 10/29/15 0424  AST 18  --   --   --   ALT 12*  --   --   --   ALKPHOS 56  --   --   --   BILITOT 0.8  --   --   --   PROT 6.1*  --   --   --   ALBUMIN 3.1* 2.6* 2.4* 2.5*   No results for input(s): LIPASE, AMYLASE in the last 168 hours. No results for input(s): AMMONIA in the last 168 hours.  Cardiac Enzymes:  Recent Labs Lab 10/26/15 0245  TROPONINI 0.06*    BNP (last 3 results)  Recent Labs  01/08/15 1000 09/20/15 1833 10/26/15 0245  BNP 439.7* 196.1* 631.1*  CBG:  Recent Labs Lab 10/28/15 2316 10/29/15 0407 10/29/15 0844 10/29/15 1225 10/29/15 1730  GLUCAP 184* 173* 145* 145* 135*    Recent Results (from the past 240 hour(s))  Culture, respiratory (NON-Expectorated)     Status: None   Collection Time: 10/26/15  2:51 AM  Result Value Ref Range Status   Specimen Description TRACHEAL ASPIRATE  Final   Special Requests NONE  Final   Gram Stain   Final    NO WBC SEEN NO SQUAMOUS EPITHELIAL CELLS SEEN NO ORGANISMS SEEN Performed at [facility]    Culture   Final    NORMAL OROPHARYNGEAL FLORA Performed at [facility]    Report Status 10/28/2015 FINAL  Final  MRSA PCR Screening     Status: None   Collection Time: 10/26/15  2:51 AM  Result Value Ref Range Status   MRSA by PCR NEGATIVE NEGATIVE Final    Comment:        The GeneXpert MRSA Assay (FDA approved for NASAL specimens only), is one component of a comprehensive MRSA colonization surveillance program. It is not intended to diagnose MRSA infection nor to guide or monitor treatment for MRSA infections.      Studies: Dg Abd 2 Views  10/29/2015  CLINICAL DATA:  Partial small bowel obstruction.  EXAM: ABDOMEN - 2 VIEW COMPARISON:  10/26/2015 FINDINGS: Nasogastric tube no longer visualized. Gaseous dilatation of small bowel remains with some air also present throughout most of the colon. Dilatation of small bowel slightly worse compared to 3/14. Findings remain consistent with partial small bowel obstruction versus generalized ileus. Decubitus film shows no evidence of free intraperitoneal air. No abnormal calcifications identified. IMPRESSION: Slight worsening of partial small bowel obstruction versus ileus. Electronically Signed   By: Aletta Edouard M.D.   On: 10/29/2015 11:39     Scheduled Meds: . antiseptic oral rinse  7 mL Mouth Rinse q12n4p  . aspirin  81 mg Per Tube Daily  . carvedilol  12.5 mg Oral BID WC  . chlorhexidine  15 mL Mouth Rinse BID  . enoxaparin (LOVENOX) injection  40 mg Subcutaneous Q24H  . insulin aspart  2-6 Units Subcutaneous 6 times per day  . ipratropium-albuterol  3 mL Nebulization Q4H  . levofloxacin (LEVAQUIN) IV  500 mg Intravenous Q24H  . losartan  25 mg Oral Daily  . methylPREDNISolone (SOLU-MEDROL) injection  40 mg Intravenous Q12H  . metoCLOPramide (REGLAN) injection  10 mg Intravenous 3 times per day  . pantoprazole  40 mg Oral Q1200  . pregabalin  75 mg Per Tube BID  . simvastatin  10 mg Per Tube q1800  . sodium chloride flush  3 mL Intravenous Q12H   Continuous Infusions: . sodium chloride 10 mL/hr at 10/28/15 1003   PRN Meds: acetaminophen **OR** acetaminophen, alum & mag hydroxide-simeth, ipratropium-albuterol, ondansetron **OR** ondansetron (ZOFRAN) IV  Time spent: 30 minutes  Author: Berle Mull, MD [facility] Pager: [phone] 10/29/2015 6:29 PM  If 7PM-7AM, please contact night-coverage at [email or web address], password [facility]

## 2015-10-30 DIAGNOSIS — N179 Acute kidney failure, unspecified: Secondary | ICD-10-CM

## 2015-10-30 LAB — CBC WITH DIFFERENTIAL/PLATELET
BASOS ABS: 0 10*3/uL (ref 0.0–0.1)
Basophils Relative: 0 %
EOS PCT: 0 %
Eosinophils Absolute: 0 10*3/uL (ref 0.0–0.7)
HEMATOCRIT: 26.6 % — AB (ref 39.0–52.0)
HEMOGLOBIN: 8.3 g/dL — AB (ref 13.0–17.0)
LYMPHS PCT: 9 %
Lymphs Abs: 0.6 10*3/uL — ABNORMAL LOW (ref 0.7–4.0)
MCH: 18.6 pg — AB (ref 26.0–34.0)
MCHC: 31.2 g/dL (ref 30.0–36.0)
MCV: 59.6 fL — ABNORMAL LOW (ref 78.0–100.0)
MONO ABS: 0.1 10*3/uL (ref 0.1–1.0)
MONOS PCT: 2 %
NEUTROS ABS: 5.7 10*3/uL (ref 1.7–7.7)
Neutrophils Relative %: 89 %
Platelets: 217 10*3/uL (ref 150–400)
RBC: 4.46 MIL/uL (ref 4.22–5.81)
RDW: 16.8 % — AB (ref 11.5–15.5)
WBC: 6.4 10*3/uL (ref 4.0–10.5)

## 2015-10-30 LAB — GLUCOSE, CAPILLARY
GLUCOSE-CAPILLARY: 184 mg/dL — AB (ref 65–99)
GLUCOSE-CAPILLARY: 159 mg/dL — AB (ref 65–99)
Glucose-Capillary: 127 mg/dL — ABNORMAL HIGH (ref 65–99)
Glucose-Capillary: 151 mg/dL — ABNORMAL HIGH (ref 65–99)
Glucose-Capillary: 173 mg/dL — ABNORMAL HIGH (ref 65–99)

## 2015-10-30 LAB — RENAL FUNCTION PANEL
ALBUMIN: 2.5 g/dL — AB (ref 3.5–5.0)
Anion gap: 9 (ref 5–15)
BUN: 39 mg/dL — AB (ref 6–20)
CHLORIDE: 106 mmol/L (ref 101–111)
CO2: 26 mmol/L (ref 22–32)
CREATININE: 1.36 mg/dL — AB (ref 0.61–1.24)
Calcium: 8.3 mg/dL — ABNORMAL LOW (ref 8.9–10.3)
GFR calc Af Amer: 52 mL/min — ABNORMAL LOW (ref >60)
GFR, EST NON AFRICAN AMERICAN: 44 mL/min — AB (ref >60)
GLUCOSE: 168 mg/dL — AB (ref 65–99)
PHOSPHORUS: 4.1 mg/dL (ref 2.5–4.6)
POTASSIUM: 4 mmol/L (ref 3.5–5.1)
Sodium: 141 mmol/L (ref 135–145)

## 2015-10-30 LAB — MAGNESIUM: MAGNESIUM: 2.1 mg/dL (ref 1.7–2.4)

## 2015-10-30 MED ORDER — PREDNISONE 50 MG PO TABS
50.0000 mg | ORAL_TABLET | Freq: Every day | ORAL | Status: DC
  Administered 2015-10-30 – 2015-11-02 (×4): 50 mg via ORAL
  Filled 2015-10-30 (×5): qty 1

## 2015-10-30 MED ORDER — DIPHENHYDRAMINE HCL 25 MG PO CAPS
25.0000 mg | ORAL_CAPSULE | Freq: Once | ORAL | Status: AC
  Administered 2015-10-30: 25 mg via ORAL
  Filled 2015-10-30: qty 1

## 2015-10-30 MED ORDER — FUROSEMIDE 20 MG PO TABS
20.0000 mg | ORAL_TABLET | Freq: Every day | ORAL | Status: DC
  Administered 2015-10-30 – 2015-11-02 (×4): 20 mg via ORAL
  Filled 2015-10-30 (×5): qty 1

## 2015-10-30 MED ORDER — IPRATROPIUM-ALBUTEROL 0.5-2.5 (3) MG/3ML IN SOLN
3.0000 mL | Freq: Three times a day (TID) | RESPIRATORY_TRACT | Status: DC
  Administered 2015-10-31 – 2015-11-02 (×7): 3 mL via RESPIRATORY_TRACT
  Filled 2015-10-30 (×6): qty 3

## 2015-10-30 NOTE — Progress Notes (Signed)
[facility] Progress Note  Patient: [name] [id number]   PCP:  Melinda Crutch, MD DOB: 05-07-1924   DOA: 10/24/2015   DOS: 10/30/2015   Date of Service: the patient was seen and examined on 10/30/2015  Subjective: Patient mentions cough is getting better. Shortness of breath is about the same as well as leg swelling. No nausea no vomiting no abdominal pain. Continues to have constipation. Passing gas. Nutrition: Clear liquid diet.  Brief hospital course: Patient was admitted on 10/24/2015, with complaint of shortness of breath, was found to have healthcare associated pneumonia with acute hypoxic respiratory failure. Patient was on broad-spectrum antibiotics and while he was not improving he was transferred to ICU with respiratory distress, intubated and 10/26/2015 and extubated the next day on 10/27/2015. Transferred to telemetry on 10/28/2015 Currently further plan is continue with the IV antibiotics, continue with the steroids and breathing treatment and await resolution of the ileus  Assessment and Plan: 1. Acute respiratory failure with hypoxia ([place]) Healthcare associated pneumonia and COPD exacerbation Acute on chronic combined CHF. Currently switched to oral prednisone, continue IV Levaquin, duo nebs, flutter device. Negative sputum culture Restart low-dose Lasix.  2. Ileus. Patient had not had any bowel movement since 10/23/2015. We will use IV Reglan as per recommendation from general surgery. Appreciated general surgery. We'll try to identify cause to avoid reoccurrence. Advance the diet to full liquid diet  3. Obstructive sleep apnea. Continue CPAP.  4. Essential hypertension EF 40-45%. Continue Coreg and Cozaar. Resume low-dose Lasix. Continue Zocor and aspirin.  5. Chronic disease stage III. Renal function stable Would recheck in the morning.  6. Pedal edema. Lower extended Doppler negative for DVT. Continue monitoring Encourage ambulation.  7. Chronic  anemia. No evidence of acute worsening. We'll continue to closely monitor.  Activity: physical therapy ordered Bowel regimen: last BM 10/23/2015 DVT Prophylaxis: subcutaneous Heparin Nutrition: Clear liquid diet Advance goals of care discussion: Full code  HPI: As per the H and P dictated on admission, "[name] is a 82 y.o. male With a past medical history of chronic obstructive pulmonary disease, Stage III chronic disease, hypertension, recently admitted to the medicine service from 10/14/2015 through 10/23/2015 where He was treated for small bowel obstruction/ileus. He showed clinical improvement with advancement of diet and was discharged home in stable condition. In the last 24 hours he has Developed worsening Shortness of breath associated with cough, having a thick green/yellow sputum production, wheezing, generalized weakness, malaise, poor appetite. A chest x-ray performed in the emergency department revealed presence of lower lobe airspace disease concerning for healthcare associated pneumonia." Procedures: Intubation, echocardiogram Consultants: Critical care , general surgery Antibiotics: Anti-infectives    Start     Dose/Rate Route Frequency Ordered Stop   10/28/15 1200  levofloxacin (LEVAQUIN) IVPB 500 mg     500 mg 100 mL/hr over 60 Minutes Intravenous Every 24 hours 10/28/15 1005 10/30/15 1238   10/25/15 1600  vancomycin (VANCOCIN) IVPB 1000 mg/200 mL premix  Status:  Discontinued     1,000 mg 200 mL/hr over 60 Minutes Intravenous Every 24 hours 10/24/15 1438 10/28/15 1005   10/24/15 2200  aztreonam (AZACTAM) 1 g in dextrose 5 % 50 mL IVPB  Status:  Discontinued     1 g 100 mL/hr over 30 Minutes Intravenous 3 times per day 10/24/15 1438 10/28/15 1005   10/24/15 1715  aztreonam (AZACTAM) injection 1 g  Status:  Discontinued     1 g Intramuscular 3 times per day  10/24/15 1713 10/24/15 1719   10/24/15 1445  aztreonam (AZACTAM) 1 g in dextrose 5 % 50 mL IVPB     1  g 100 mL/hr over 30 Minutes Intravenous NOW 10/24/15 1432 10/24/15 1524   10/24/15 1445  vancomycin (VANCOCIN) IVPB 1000 mg/200 mL premix     1,000 mg 200 mL/hr over 60 Minutes Intravenous NOW 10/24/15 1432 10/24/15 1624      Family Communication: no family was present at bedside, at the time of interview.   Disposition:  Expected discharge date:11/01/2015 Barriers to safe discharge: Improvement in respiratory status and ileus   Intake/Output Summary (Last 24 hours) at 10/30/15 1744 Last data filed at 10/30/15 1700  Gross per 24 hour  Intake    560 ml  Output   2800 ml  Net  -2240 ml   Filed Weights   10/24/15 1650  Weight: 80.287 kg (177 lb)    Objective: Physical Exam: Filed Vitals:   10/30/15 0838 10/30/15 1216 10/30/15 1500 10/30/15 1636  BP:   119/78   Pulse:   77   Temp:   98.1 F (36.7 C)   TempSrc:   Oral   Resp:   18   Height:      Weight:      SpO2: 92% 95% 97% 93%   General: Appear in moderate distress, no Rash; Oral Mucosa moist. Cardiovascular: S1 and S2 Present, no Murmur, no JVD Respiratory: Bilateral Air entry present and basal Crackles, no wheezes Abdomen: Bowel Sound present, Soft and no tenderness Extremities: bilateral Pedal edema, bilateral calf tenderness Neurology: Grossly no focal neuro deficit.  Data Reviewed: CBC:  Recent Labs Lab 10/24/15 1358  10/26/15 0133 10/27/15 0319 10/28/15 0325 10/29/15 0424 10/30/15 0540  WBC 7.4  < > 15.4* 14.3* 9.0 7.9 6.4  NEUTROABS 5.6  --   --  13.3* 8.0* 7.1 5.7  HGB 7.9*  < > 9.4* 8.5* 7.8* 8.2* 8.3*  HCT 25.5*  < > 32.0* 27.6* 25.5* 25.7* 26.6*  MCV 61.2*  < > 64.5* 60.5* 62.8* 59.8* 59.6*  PLT 186  < > 262 241 203 221 217  < > = values in this interval not displayed. Basic Metabolic Panel:  Recent Labs Lab 10/26/15 0245 10/27/15 0319 10/28/15 0325 10/29/15 0424 10/30/15 0540  NA 140 137 138 141 141  K 4.8 4.4 4.1 4.0 4.0  CL 111 107 109 108 106  CO2 21* 21* 23 23 26   GLUCOSE  270* 175* 134* 197* 168*  BUN 26* 34* 34* 39* 39*  CREATININE 1.55* 1.68* 1.38* 1.32* 1.36*  CALCIUM 8.2* 8.3* 8.2* 8.4* 8.3*  MG  --  1.9 2.0 2.2 2.1  PHOS  --  3.1 3.3 3.9 4.1   Liver Function Tests:  Recent Labs Lab 10/24/15 1358 10/27/15 0319 10/28/15 0325 10/29/15 0424 10/30/15 0540  AST 18  --   --   --   --   ALT 12*  --   --   --   --   ALKPHOS 56  --   --   --   --   BILITOT 0.8  --   --   --   --   PROT 6.1*  --   --   --   --   ALBUMIN 3.1* 2.6* 2.4* 2.5* 2.5*   No results for input(s): LIPASE, AMYLASE in the last 168 hours. No results for input(s): AMMONIA in the last 168 hours.  Cardiac Enzymes:  Recent Labs Lab 10/26/15 0245  TROPONINI 0.06*    BNP (last 3 results)  Recent Labs  01/08/15 1000 09/20/15 1833 10/26/15 0245  BNP 439.7* 196.1* 631.1*    CBG:  Recent Labs Lab 10/29/15 1957 10/30/15 0013 10/30/15 0736 10/30/15 1149 10/30/15 1730  GLUCAP 139* 127* 151* 184* 173*    Recent Results (from the past 240 hour(s))  Culture, respiratory (NON-Expectorated)     Status: None   Collection Time: 10/26/15  2:51 AM  Result Value Ref Range Status   Specimen Description TRACHEAL ASPIRATE  Final   Special Requests NONE  Final   Gram Stain   Final    NO WBC SEEN NO SQUAMOUS EPITHELIAL CELLS SEEN NO ORGANISMS SEEN Performed at [facility]    Culture   Final    NORMAL OROPHARYNGEAL FLORA Performed at [facility]    Report Status 10/28/2015 FINAL  Final  MRSA PCR Screening     Status: None   Collection Time: 10/26/15  2:51 AM  Result Value Ref Range Status   MRSA by PCR NEGATIVE NEGATIVE Final    Comment:        The GeneXpert MRSA Assay (FDA approved for NASAL specimens only), is one component of a comprehensive MRSA colonization surveillance program. It is not intended to diagnose MRSA infection nor to guide or monitor treatment for MRSA infections.      Studies: No results found.   Scheduled Meds: .  antiseptic oral rinse  7 mL Mouth Rinse q12n4p  . aspirin  81 mg Per Tube Daily  . carvedilol  12.5 mg Oral BID WC  . chlorhexidine  15 mL Mouth Rinse BID  . enoxaparin (LOVENOX) injection  40 mg Subcutaneous Q24H  . furosemide  20 mg Oral Daily  . insulin aspart  0-5 Units Subcutaneous QHS  . insulin aspart  0-9 Units Subcutaneous TID WC  . ipratropium-albuterol  3 mL Nebulization QID  . losartan  25 mg Oral Daily  . metoCLOPramide (REGLAN) injection  10 mg Intravenous 3 times per day  . pantoprazole  40 mg Oral Q1200  . predniSONE  50 mg Oral Q breakfast  . pregabalin  75 mg Per Tube BID  . simvastatin  10 mg Per Tube q1800  . sodium chloride flush  3 mL Intravenous Q12H   Continuous Infusions: . sodium chloride 10 mL/hr at 10/28/15 1003   PRN Meds: acetaminophen **OR** acetaminophen, albuterol, alum & mag hydroxide-simeth, ondansetron **OR** ondansetron (ZOFRAN) IV  Time spent: 30 minutes  Author: Berle Mull, MD [facility] Pager: [phone] 10/30/2015 5:44 PM  If 7PM-7AM, please contact night-coverage at [email or web address], password [facility]

## 2015-10-30 NOTE — Evaluation (Signed)
Physical Therapy Evaluation Patient Details Name: [name] MRN: [id number] DOB: Dec 29, 1923 Today's Date: 10/30/2015   History of Present Illness  82 yo male admitted with acute respiratory failure, Pna. Intubated 3/14. Extubated 3/15. Hx of COPD, CAD, CVA, prostate cancer, anemia, MI, bil foot drop, neuropathy, vertigo  Clinical Impression  On eval, pt required Min assist +2 for mobility-walked ~75 feet with RW. Pt is generally weak and fatigues fairly quickly with activity. Discussed d/c plan-recommend ST rehab at SNF. Family states plan is to hopefully transition to either ILF or ALF after rehab. O2 sats 93% on RA with activity.    Follow Up Recommendations SNF    Equipment Recommendations  None recommended by PT    Recommendations for Other Services       Precautions / Restrictions Precautions Precautions: Fall Precaution Comments: bil LE AFO shoes Restrictions Weight Bearing Restrictions: No      Mobility  Bed Mobility Overal bed mobility: Needs Assistance Bed Mobility: Supine to Sit     Supine to sit: HOB elevated;Min guard     General bed mobility comments: close guard for safety. Increased time.  Transfers Overall transfer level: Needs assistance Equipment used: Rolling walker (2 wheeled) Transfers: Sit to/from Stand Sit to Stand: Min assist;+2 physical assistance;+2 safety/equipment;From elevated surface         General transfer comment: Assist to rise, stabilize, control descent. VCs safety, hand placement. Total assist to don bil AFOs EOB  Ambulation/Gait Ambulation/Gait assistance: Min assist;+2 safety/equipment;+2 physical assistance Ambulation Distance (Feet): 75 Feet Assistive device: Rolling walker (2 wheeled) Gait Pattern/deviations: Step-through pattern;Decreased stride length;Decreased step length - left;Decreased step length - right;Trunk flexed     General Gait Details: Assist to stabilize pt. slow gait speed. Followed with recliner for  safety. O2 sats 93% RA-dyspnea 2/4, wheezing noted as well.  Stairs            Wheelchair Mobility    Modified Rankin (Stroke Patients Only)       Balance Overall balance assessment: Needs assistance         Standing balance support: Bilateral upper extremity supported;During functional activity Standing balance-Leahy Scale: Poor Standing balance comment: requires RW                             Pertinent Vitals/Pain Pain Assessment: No/denies pain    Home Living Family/patient expects to be discharged to:: Private residence Living Arrangements: Alone Available Help at Discharge: Personal care attendant (7-7; 6 days a week) Type of Home: House Home Access: Stairs to enter   [employer] of Steps: 3 Home Layout: One level Home Equipment: Walker - 2 wheels;Walker - 4 wheels      Prior Function Level of Independence: Needs assistance   Gait / Transfers Assistance Needed: walks with RW  ADL's / Homemaking Assistance Needed: aide present [place] to assist with house hold and shopping        Hand Dominance        Extremity/Trunk Assessment   Upper Extremity Assessment: Generalized weakness           Lower Extremity Assessment: Generalized weakness RLE Deficits / Details: foot drop LLE Deficits / Details: foot drop  Cervical / Trunk Assessment: Kyphotic  Communication   Communication: HOH  Cognition Arousal/Alertness: Awake/alert Behavior During Therapy: WFL for tasks assessed/performed Overall Cognitive Status: Within Functional Limits for tasks assessed  General Comments      Exercises        Assessment/Plan    PT Assessment Patient needs continued PT services  PT Diagnosis Difficulty walking;Abnormality of gait;Generalized weakness   PT Problem List Decreased strength;Decreased activity tolerance;Decreased balance;Decreased mobility;Decreased coordination;Decreased knowledge of use of  DME  PT Treatment Interventions DME instruction;Gait training;Functional mobility training;Therapeutic activities;Therapeutic exercise;Patient/family education;Balance training   PT Goals (Current goals can be found in the Care Plan section) Acute Rehab PT Goals Patient Stated Goal: get stronger PT Goal Formulation: With patient/family Time For Goal Achievement: 11/13/15 Potential to Achieve Goals: Good    Frequency Min 3X/week   Barriers to discharge        Co-evaluation               End of Session Equipment Utilized During Treatment: Gait belt;Other (comment) (bil AFOs) Activity Tolerance: Patient tolerated treatment well Patient left: in chair;with call bell/phone within reach;with family/visitor present           Time: 1355-1414 PT Time Calculation (min) (ACUTE ONLY): 19 min   Charges:   PT Evaluation $PT Eval Low Complexity: 1 Procedure     PT G Codes:        Weston Anna, MPT Pager: [phone]

## 2015-10-31 LAB — CREATININE, SERUM
CREATININE: 1.16 mg/dL (ref 0.61–1.24)
GFR, EST NON AFRICAN AMERICAN: 54 mL/min — AB (ref >60)

## 2015-10-31 LAB — RENAL FUNCTION PANEL
Albumin: 2.6 g/dL — ABNORMAL LOW (ref 3.5–5.0)
Anion gap: 8 (ref 5–15)
BUN: 35 mg/dL — AB (ref 6–20)
CHLORIDE: 106 mmol/L (ref 101–111)
CO2: 27 mmol/L (ref 22–32)
CREATININE: 1.28 mg/dL — AB (ref 0.61–1.24)
Calcium: 8.3 mg/dL — ABNORMAL LOW (ref 8.9–10.3)
GFR calc Af Amer: 55 mL/min — ABNORMAL LOW (ref >60)
GFR calc non Af Amer: 48 mL/min — ABNORMAL LOW (ref >60)
GLUCOSE: 188 mg/dL — AB (ref 65–99)
POTASSIUM: 4.2 mmol/L (ref 3.5–5.1)
Phosphorus: 3.5 mg/dL (ref 2.5–4.6)
Sodium: 141 mmol/L (ref 135–145)

## 2015-10-31 LAB — CBC WITH DIFFERENTIAL/PLATELET
BASOS ABS: 0 10*3/uL (ref 0.0–0.1)
Basophils Relative: 0 %
EOS PCT: 0 %
Eosinophils Absolute: 0 10*3/uL (ref 0.0–0.7)
HEMATOCRIT: 27.6 % — AB (ref 39.0–52.0)
HEMOGLOBIN: 8.4 g/dL — AB (ref 13.0–17.0)
LYMPHS PCT: 8 %
Lymphs Abs: 0.5 10*3/uL — ABNORMAL LOW (ref 0.7–4.0)
MCH: 19 pg — ABNORMAL LOW (ref 26.0–34.0)
MCHC: 30.4 g/dL (ref 30.0–36.0)
MCV: 62.4 fL — AB (ref 78.0–100.0)
MONOS PCT: 5 %
Monocytes Absolute: 0.3 10*3/uL (ref 0.1–1.0)
NEUTROS ABS: 5.8 10*3/uL (ref 1.7–7.7)
Neutrophils Relative %: 87 %
Platelets: 202 10*3/uL (ref 150–400)
RBC: 4.42 MIL/uL (ref 4.22–5.81)
RDW: 16.6 % — ABNORMAL HIGH (ref 11.5–15.5)
WBC: 6.6 10*3/uL (ref 4.0–10.5)

## 2015-10-31 LAB — GLUCOSE, CAPILLARY
GLUCOSE-CAPILLARY: 150 mg/dL — AB (ref 65–99)
GLUCOSE-CAPILLARY: 190 mg/dL — AB (ref 65–99)
GLUCOSE-CAPILLARY: 189 mg/dL — AB (ref 65–99)
GLUCOSE-CAPILLARY: 186 mg/dL — AB (ref 65–99)

## 2015-10-31 LAB — MAGNESIUM: MAGNESIUM: 2 mg/dL (ref 1.7–2.4)

## 2015-10-31 MED ORDER — BISACODYL 10 MG RE SUPP
10.0000 mg | Freq: Once | RECTAL | Status: AC
  Administered 2015-10-31: 10 mg via RECTAL
  Filled 2015-10-31: qty 1

## 2015-10-31 MED ORDER — DIPHENHYDRAMINE HCL 25 MG PO CAPS
25.0000 mg | ORAL_CAPSULE | Freq: Once | ORAL | Status: AC
  Administered 2015-10-31: 25 mg via ORAL
  Filled 2015-10-31: qty 1

## 2015-10-31 NOTE — Progress Notes (Signed)
[facility] Progress Note  Patient: [name] [id number]   PCP:  Melinda Crutch, MD DOB: 04/16/1924   DOA: 10/24/2015   DOS: 10/31/2015   Date of Service: the patient was seen and examined on 10/31/2015  Subjective: Denies having any bowel movement continues to pass gas tolerating oral diet without any nausea or vomiting Nutrition: Tolerating liquids  Brief hospital course: Patient was admitted on 10/24/2015, with complaint of shortness of breath, was found to have healthcare associated pneumonia with acute hypoxic respiratory failure. Patient was on broad-spectrum antibiotics and while he was not improving he was transferred to ICU with respiratory distress, intubated and 10/26/2015 and extubated the next day on 10/27/2015. Transferred to telemetry on 10/28/2015 Currently further plan is continue with the IV antibiotics, continue with the steroids and breathing treatment and await resolution of the ileus  Assessment and Plan: 1. Acute respiratory failure with hypoxia ([place]) Healthcare associated pneumonia and COPD exacerbation Acute on chronic combined CHF. Currently switched to oral prednisone, continue IV Levaquin, duo nebs, flutter device. Negative sputum culture Restart low-dose Lasix.  2. Ileus. Patient had not had any bowel movement since 10/23/2015. Continue IV Reglan as per recommendation from general surgery. Appreciated general surgery. Continue full liquid diet today Increase ambulation  3. Obstructive sleep apnea. Continue CPAP.  4. Essential hypertension EF 40-45%. Continue Coreg and Cozaar. Resume low-dose Lasix. Continue Zocor and aspirin.  5. Chronic disease stage III. Renal function stable today to monitor  6. Pedal edema. Lower extended Doppler negative for DVT. Continue monitoring Encourage ambulation.  7. Chronic anemia. No evidence of acute worsening. We'll continue to closely monitor.  Activity: physical therapy ordered Bowel regimen: last BM  10/23/2015 DVT Prophylaxis: subcutaneous Heparin Nutrition: Clear liquid diet Advance goals of care discussion: Full code  HPI: As per the H and P dictated on admission, "[name] is a 82 y.o. male With a past medical history of chronic obstructive pulmonary disease, Stage III chronic disease, hypertension, recently admitted to the medicine service from 10/14/2015 through 10/23/2015 where He was treated for small bowel obstruction/ileus. He showed clinical improvement with advancement of diet and was discharged home in stable condition. In the last 24 hours he has Developed worsening Shortness of breath associated with cough, having a thick green/yellow sputum production, wheezing, generalized weakness, malaise, poor appetite. A chest x-ray performed in the emergency department revealed presence of lower lobe airspace disease concerning for healthcare associated pneumonia." Procedures: Intubation, echocardiogram Consultants: Critical care , general surgery Antibiotics: Anti-infectives    Start     Dose/Rate Route Frequency Ordered Stop   10/28/15 1200  levofloxacin (LEVAQUIN) IVPB 500 mg     500 mg 100 mL/hr over 60 Minutes Intravenous Every 24 hours 10/28/15 1005 10/30/15 1238   10/25/15 1600  vancomycin (VANCOCIN) IVPB 1000 mg/200 mL premix  Status:  Discontinued     1,000 mg 200 mL/hr over 60 Minutes Intravenous Every 24 hours 10/24/15 1438 10/28/15 1005   10/24/15 2200  aztreonam (AZACTAM) 1 g in dextrose 5 % 50 mL IVPB  Status:  Discontinued     1 g 100 mL/hr over 30 Minutes Intravenous 3 times per day 10/24/15 1438 10/28/15 1005   10/24/15 1715  aztreonam (AZACTAM) injection 1 g  Status:  Discontinued     1 g Intramuscular 3 times per day 10/24/15 1713 10/24/15 1719   10/24/15 1445  aztreonam (AZACTAM) 1 g in dextrose 5 % 50 mL IVPB     1 g 100  mL/hr over 30 Minutes Intravenous NOW 10/24/15 1432 10/24/15 1524   10/24/15 1445  vancomycin (VANCOCIN) IVPB 1000 mg/200 mL premix       1,000 mg 200 mL/hr over 60 Minutes Intravenous NOW 10/24/15 1432 10/24/15 1624      Family Communication: no family was present at bedside, at the time of interview.   Disposition:  Expected discharge date:11/02/2015 Barriers to safe discharge: Improvement in ileus   Intake/Output Summary (Last 24 hours) at 10/31/15 1745 Last data filed at 10/31/15 1344  Gross per 24 hour  Intake   1000 ml  Output   1100 ml  Net   -100 ml   Filed Weights   10/24/15 1650 10/31/15 0609  Weight: 80.287 kg (177 lb) 79.5 kg (175 lb 4.3 oz)    Objective: Physical Exam: Filed Vitals:   10/31/15 0609 10/31/15 0812 10/31/15 1343 10/31/15 1712  BP: 134/56 139/69 121/58 142/57  Pulse: 65 70 67 66  Temp: 98.2 F (36.8 C)  98.1 F (36.7 C)   TempSrc: Oral  Oral   Resp: 16  18   Height:      Weight: 79.5 kg (175 lb 4.3 oz)     SpO2: 100%  98%    General: Appear in moderate distress, no Rash; Oral Mucosa moist. Cardiovascular: S1 and S2 Present, no Murmur, no JVD Respiratory: Bilateral Air entry present and basal Crackles, no wheezes Abdomen: Bowel Sound present, Soft and no tenderness Extremities: bilateral Pedal edema, bilateral calf tenderness  Data Reviewed: CBC:  Recent Labs Lab 10/27/15 0319 10/28/15 0325 10/29/15 0424 10/30/15 0540 10/31/15 0549  WBC 14.3* 9.0 7.9 6.4 6.6  NEUTROABS 13.3* 8.0* 7.1 5.7 5.8  HGB 8.5* 7.8* 8.2* 8.3* 8.4*  HCT 27.6* 25.5* 25.7* 26.6* 27.6*  MCV 60.5* 62.8* 59.8* 59.6* 62.4*  PLT 241 203 221 217 [id number]   Basic Metabolic Panel:  Recent Labs Lab 10/27/15 0319 10/28/15 0325 10/29/15 0424 10/30/15 0540 10/31/15 0549  NA 137 138 141 141 141  K 4.4 4.1 4.0 4.0 4.2  CL 107 109 108 106 106  CO2 21* 23 23 26 27   GLUCOSE 175* 134* 197* 168* 188*  BUN 34* 34* 39* 39* 35*  CREATININE 1.68* 1.38* 1.32* 1.36* 1.28*  1.16  CALCIUM 8.3* 8.2* 8.4* 8.3* 8.3*  MG 1.9 2.0 2.2 2.1 2.0  PHOS 3.1 3.3 3.9 4.1 3.5   Liver Function Tests:  Recent  Labs Lab 10/27/15 0319 10/28/15 0325 10/29/15 0424 10/30/15 0540 10/31/15 0549  ALBUMIN 2.6* 2.4* 2.5* 2.5* 2.6*   No results for input(s): LIPASE, AMYLASE in the last 168 hours. No results for input(s): AMMONIA in the last 168 hours.  Cardiac Enzymes:  Recent Labs Lab 10/26/15 0245  TROPONINI 0.06*    BNP (last 3 results)  Recent Labs  01/08/15 1000 09/20/15 1833 10/26/15 0245  BNP 439.7* 196.1* 631.1*    CBG:  Recent Labs Lab 10/30/15 1149 10/30/15 1730 10/30/15 2130 10/31/15 0727 10/31/15 1143  GLUCAP 184* 173* 159* 150* 190*    Recent Results (from the past 240 hour(s))  Culture, respiratory (NON-Expectorated)     Status: None   Collection Time: 10/26/15  2:51 AM  Result Value Ref Range Status   Specimen Description TRACHEAL ASPIRATE  Final   Special Requests NONE  Final   Gram Stain   Final    NO WBC SEEN NO SQUAMOUS EPITHELIAL CELLS SEEN NO ORGANISMS SEEN Performed at [facility]   Final  NORMAL OROPHARYNGEAL FLORA Performed at [facility]    Report Status 10/28/2015 FINAL  Final  MRSA PCR Screening     Status: None   Collection Time: 10/26/15  2:51 AM  Result Value Ref Range Status   MRSA by PCR NEGATIVE NEGATIVE Final    Comment:        The GeneXpert MRSA Assay (FDA approved for NASAL specimens only), is one component of a comprehensive MRSA colonization surveillance program. It is not intended to diagnose MRSA infection nor to guide or monitor treatment for MRSA infections.      Studies: No results found.   Scheduled Meds: . antiseptic oral rinse  7 mL Mouth Rinse q12n4p  . aspirin  81 mg Per Tube Daily  . carvedilol  12.5 mg Oral BID WC  . chlorhexidine  15 mL Mouth Rinse BID  . enoxaparin (LOVENOX) injection  40 mg Subcutaneous Q24H  . furosemide  20 mg Oral Daily  . insulin aspart  0-5 Units Subcutaneous QHS  . insulin aspart  0-9 Units Subcutaneous TID WC  . ipratropium-albuterol  3 mL  Nebulization TID  . losartan  25 mg Oral Daily  . metoCLOPramide (REGLAN) injection  10 mg Intravenous 3 times per day  . pantoprazole  40 mg Oral Q1200  . predniSONE  50 mg Oral Q breakfast  . pregabalin  75 mg Per Tube BID  . simvastatin  10 mg Per Tube q1800  . sodium chloride flush  3 mL Intravenous Q12H   Continuous Infusions: . sodium chloride 10 mL/hr at 10/28/15 1003   PRN Meds: acetaminophen **OR** acetaminophen, albuterol, alum & mag hydroxide-simeth, ondansetron **OR** ondansetron (ZOFRAN) IV  Time spent: 30 minutes  Author: Berle Mull, MD [facility] Pager: [phone] 10/31/2015 5:45 PM  If 7PM-7AM, please contact night-coverage at [email or web address], password [facility]

## 2015-11-01 LAB — GLUCOSE, CAPILLARY
GLUCOSE-CAPILLARY: 143 mg/dL — AB (ref 65–99)
GLUCOSE-CAPILLARY: 167 mg/dL — AB (ref 65–99)
Glucose-Capillary: 209 mg/dL — ABNORMAL HIGH (ref 65–99)
Glucose-Capillary: 195 mg/dL — ABNORMAL HIGH (ref 65–99)

## 2015-11-01 LAB — CBC WITH DIFFERENTIAL/PLATELET
BASOS PCT: 0 %
Basophils Absolute: 0 10*3/uL (ref 0.0–0.1)
EOS ABS: 0 10*3/uL (ref 0.0–0.7)
EOS PCT: 0 %
HCT: 27.7 % — ABNORMAL LOW (ref 39.0–52.0)
Hemoglobin: 8.5 g/dL — ABNORMAL LOW (ref 13.0–17.0)
LYMPHS ABS: 0.7 10*3/uL (ref 0.7–4.0)
Lymphocytes Relative: 12 %
MCH: 19.1 pg — AB (ref 26.0–34.0)
MCHC: 30.7 g/dL (ref 30.0–36.0)
MCV: 62.4 fL — AB (ref 78.0–100.0)
MONO ABS: 0.3 10*3/uL (ref 0.1–1.0)
Monocytes Relative: 5 %
NEUTROS ABS: 5.1 10*3/uL (ref 1.7–7.7)
Neutrophils Relative %: 83 %
PLATELETS: 186 10*3/uL (ref 150–400)
RBC: 4.44 MIL/uL (ref 4.22–5.81)
RDW: 16.6 % — ABNORMAL HIGH (ref 11.5–15.5)
WBC: 6.1 10*3/uL (ref 4.0–10.5)

## 2015-11-01 LAB — RENAL FUNCTION PANEL
ALBUMIN: 2.6 g/dL — AB (ref 3.5–5.0)
ANION GAP: 7 (ref 5–15)
BUN: 35 mg/dL — AB (ref 6–20)
CALCIUM: 8.3 mg/dL — AB (ref 8.9–10.3)
CO2: 29 mmol/L (ref 22–32)
CREATININE: 1.16 mg/dL (ref 0.61–1.24)
Chloride: 105 mmol/L (ref 101–111)
GFR calc Af Amer: >60 mL/min (ref >60)
GFR calc non Af Amer: 54 mL/min — ABNORMAL LOW (ref >60)
GLUCOSE: 177 mg/dL — AB (ref 65–99)
PHOSPHORUS: 3.2 mg/dL (ref 2.5–4.6)
Potassium: 3.9 mmol/L (ref 3.5–5.1)
SODIUM: 141 mmol/L (ref 135–145)

## 2015-11-01 LAB — MAGNESIUM: Magnesium: 2 mg/dL (ref 1.7–2.4)

## 2015-11-01 MED ORDER — POLYETHYLENE GLYCOL 3350 17 G PO PACK
17.0000 g | PACK | Freq: Two times a day (BID) | ORAL | Status: DC
  Administered 2015-11-01 – 2015-11-02 (×2): 17 g via ORAL
  Filled 2015-11-01 (×2): qty 1

## 2015-11-01 MED ORDER — SENNOSIDES-DOCUSATE SODIUM 8.6-50 MG PO TABS
1.0000 | ORAL_TABLET | Freq: Two times a day (BID) | ORAL | Status: DC
  Administered 2015-11-01 – 2015-11-02 (×2): 1 via ORAL
  Filled 2015-11-01 (×2): qty 1

## 2015-11-01 MED ORDER — DIPHENHYDRAMINE HCL 25 MG PO CAPS
25.0000 mg | ORAL_CAPSULE | Freq: Once | ORAL | Status: AC
  Administered 2015-11-01: 25 mg via ORAL
  Filled 2015-11-01: qty 1

## 2015-11-01 NOTE — Progress Notes (Signed)
[facility] Progress Note  Patient: [name] [id number]   PCP:  Melinda Crutch, MD DOB: 08-24-1923   DOA: 10/24/2015   DOS: 11/01/2015   Date of Service: the patient was seen and examined on 11/01/2015  Subjective: Denies having any complaints of nausea or vomiting. Continues to have some cough but improving. Shortness of breath is also improving. Did have a bowel movement yesterday. Nutrition: Tolerating oral diet  Brief hospital course: Patient was admitted on 10/24/2015, with complaint of shortness of breath, was found to have healthcare associated pneumonia with acute hypoxic respiratory failure. Patient was on broad-spectrum antibiotics and while he was not improving he was transferred to ICU with respiratory distress, intubated and 10/26/2015 and extubated the next day on 10/27/2015. Transferred to telemetry on 10/28/2015 Currently further plan is for further placement and toleration of the oral diet  Assessment and Plan: 1. Acute respiratory failure with hypoxia ([place]) Healthcare associated pneumonia and COPD exacerbation Acute on chronic combined CHF. Currently switched to oral prednisone, continue IV Levaquin, duo nebs, flutter device. Negative sputum culture Restart low-dose Lasix.  2. Ileus. Resolved Patient had not had any bowel movement since 10/23/2015. Continue IV Reglan as per recommendation from general surgery. Appreciated general surgery. Switch to regular diet Increase ambulation Add MiraLAX and Senokot  3. Obstructive sleep apnea. Continue CPAP.  4. Essential hypertension EF 40-45%. Continue Coreg and Cozaar. Resume low-dose Lasix. Continue Zocor and aspirin.  5. Chronic kidney disease stage III. Renal function stable today to monitor  6. Pedal edema. Lower extremity Doppler negative for DVT. Continue monitoring Encourage ambulation.  7. Chronic anemia. No evidence of acute worsening. We'll continue to closely monitor.  Activity: physical therapy  ordered Bowel regimen: last BM 10/31/2015 DVT Prophylaxis: subcutaneous Heparin Nutrition: Clear liquid diet Advance goals of care discussion: Full code  HPI: As per the H and P dictated on admission, "[name] is a 82 y.o. male With a past medical history of chronic obstructive pulmonary disease, Stage III chronic disease, hypertension, recently admitted to the medicine service from 10/14/2015 through 10/23/2015 where He was treated for small bowel obstruction/ileus. He showed clinical improvement with advancement of diet and was discharged home in stable condition. In the last 24 hours he has Developed worsening Shortness of breath associated with cough, having a thick green/yellow sputum production, wheezing, generalized weakness, malaise, poor appetite. A chest x-ray performed in the emergency department revealed presence of lower lobe airspace disease concerning for healthcare associated pneumonia." Procedures: Intubation, echocardiogram Consultants: Critical care , general surgery Antibiotics: Anti-infectives    Start     Dose/Rate Route Frequency Ordered Stop   10/28/15 1200  levofloxacin (LEVAQUIN) IVPB 500 mg     500 mg 100 mL/hr over 60 Minutes Intravenous Every 24 hours 10/28/15 1005 10/30/15 1238   10/25/15 1600  vancomycin (VANCOCIN) IVPB 1000 mg/200 mL premix  Status:  Discontinued     1,000 mg 200 mL/hr over 60 Minutes Intravenous Every 24 hours 10/24/15 1438 10/28/15 1005   10/24/15 2200  aztreonam (AZACTAM) 1 g in dextrose 5 % 50 mL IVPB  Status:  Discontinued     1 g 100 mL/hr over 30 Minutes Intravenous 3 times per day 10/24/15 1438 10/28/15 1005   10/24/15 1715  aztreonam (AZACTAM) injection 1 g  Status:  Discontinued     1 g Intramuscular 3 times per day 10/24/15 1713 10/24/15 1719   10/24/15 1445  aztreonam (AZACTAM) 1 g in dextrose 5 % 50 mL IVPB  1 g 100 mL/hr over 30 Minutes Intravenous NOW 10/24/15 1432 10/24/15 1524   10/24/15 1445  vancomycin  (VANCOCIN) IVPB 1000 mg/200 mL premix     1,000 mg 200 mL/hr over 60 Minutes Intravenous NOW 10/24/15 1432 10/24/15 1624      Family Communication: no family was present at bedside, at the time of interview.   Disposition:  Expected discharge date:11/02/2015 Barriers to safe discharge: Improvement in ileus   Intake/Output Summary (Last 24 hours) at 11/01/15 1944 Last data filed at 11/01/15 1700  Gross per 24 hour  Intake    700 ml  Output    700 ml  Net      0 ml   Filed Weights   10/24/15 1650 10/31/15 0609 11/01/15 0620  Weight: 80.287 kg (177 lb) 79.5 kg (175 lb 4.3 oz) 79.4 kg (175 lb 0.7 oz)    Objective: Physical Exam: Filed Vitals:   11/01/15 0901 11/01/15 1423 11/01/15 1435 11/01/15 1937  BP:   142/60   Pulse:   62   Temp:   98.1 F (36.7 C)   TempSrc:   Oral   Resp:   18   Height:      Weight:      SpO2: 95% 94% 95% 94%   General: Appear in moderate distress, no Rash; Oral Mucosa moist. Cardiovascular: S1 and S2 Present, no Murmur Respiratory: Bilateral Air entry present and basal Crackles, no wheezes Abdomen: Bowel Sound present, Soft and no tenderness Extremities: bilateral improving Pedal edema, no calf tenderness  Data Reviewed: CBC:  Recent Labs Lab 10/28/15 0325 10/29/15 0424 10/30/15 0540 10/31/15 0549 11/01/15 0453  WBC 9.0 7.9 6.4 6.6 6.1  NEUTROABS 8.0* 7.1 5.7 5.8 5.1  HGB 7.8* 8.2* 8.3* 8.4* 8.5*  HCT 25.5* 25.7* 26.6* 27.6* 27.7*  MCV 62.8* 59.8* 59.6* 62.4* 62.4*  PLT 203 221 217 202 [id number]   Basic Metabolic Panel:  Recent Labs Lab 10/28/15 0325 10/29/15 0424 10/30/15 0540 10/31/15 0549 11/01/15 0453  NA 138 141 141 141 141  K 4.1 4.0 4.0 4.2 3.9  CL 109 108 106 106 105  CO2 23 23 26 27 29   GLUCOSE 134* 197* 168* 188* 177*  BUN 34* 39* 39* 35* 35*  CREATININE 1.38* 1.32* 1.36* 1.28*  1.16 1.16  CALCIUM 8.2* 8.4* 8.3* 8.3* 8.3*  MG 2.0 2.2 2.1 2.0 2.0  PHOS 3.3 3.9 4.1 3.5 3.2   Liver Function Tests:  Recent  Labs Lab 10/28/15 0325 10/29/15 0424 10/30/15 0540 10/31/15 0549 11/01/15 0453  ALBUMIN 2.4* 2.5* 2.5* 2.6* 2.6*   No results for input(s): LIPASE, AMYLASE in the last 168 hours. No results for input(s): AMMONIA in the last 168 hours.  Cardiac Enzymes:  Recent Labs Lab 10/26/15 0245  TROPONINI 0.06*    BNP (last 3 results)  Recent Labs  01/08/15 1000 09/20/15 1833 10/26/15 0245  BNP 439.7* 196.1* 631.1*    CBG:  Recent Labs Lab 10/31/15 1759 10/31/15 2208 11/01/15 0753 11/01/15 1159 11/01/15 1707  GLUCAP 189* 186* 143* 209* 195*    Recent Results (from the past 240 hour(s))  Culture, respiratory (NON-Expectorated)     Status: None   Collection Time: 10/26/15  2:51 AM  Result Value Ref Range Status   Specimen Description TRACHEAL ASPIRATE  Final   Special Requests NONE  Final   Gram Stain   Final    NO WBC SEEN NO SQUAMOUS EPITHELIAL CELLS SEEN NO ORGANISMS SEEN Performed at [facility]  Culture   Final    NORMAL OROPHARYNGEAL FLORA Performed at [facility]    Report Status 10/28/2015 FINAL  Final  MRSA PCR Screening     Status: None   Collection Time: 10/26/15  2:51 AM  Result Value Ref Range Status   MRSA by PCR NEGATIVE NEGATIVE Final    Comment:        The GeneXpert MRSA Assay (FDA approved for NASAL specimens only), is one component of a comprehensive MRSA colonization surveillance program. It is not intended to diagnose MRSA infection nor to guide or monitor treatment for MRSA infections.      Studies: No results found.   Scheduled Meds: . antiseptic oral rinse  7 mL Mouth Rinse q12n4p  . aspirin  81 mg Per Tube Daily  . carvedilol  12.5 mg Oral BID WC  . chlorhexidine  15 mL Mouth Rinse BID  . enoxaparin (LOVENOX) injection  40 mg Subcutaneous Q24H  . furosemide  20 mg Oral Daily  . insulin aspart  0-5 Units Subcutaneous QHS  . insulin aspart  0-9 Units Subcutaneous TID WC  . ipratropium-albuterol  3 mL  Nebulization TID  . losartan  25 mg Oral Daily  . metoCLOPramide (REGLAN) injection  10 mg Intravenous 3 times per day  . pantoprazole  40 mg Oral Q1200  . predniSONE  50 mg Oral Q breakfast  . pregabalin  75 mg Per Tube BID  . simvastatin  10 mg Per Tube q1800  . sodium chloride flush  3 mL Intravenous Q12H   Continuous Infusions: . sodium chloride 10 mL/hr at 10/28/15 1003   PRN Meds: acetaminophen **OR** acetaminophen, albuterol, alum & mag hydroxide-simeth, ondansetron **OR** ondansetron (ZOFRAN) IV  Time spent: 30 minutes  Author: Berle Mull, MD [facility] Pager: [phone] 11/01/2015 7:44 PM  If 7PM-7AM, please contact night-coverage at [email or web address], password [facility]

## 2015-11-01 NOTE — Progress Notes (Signed)
Physical Therapy Treatment Patient Details Name: [name] MRN: [id number] DOB: 03-20-1924 Today's Date: 11/01/2015    History of Present Illness 82 yo male admitted with acute respiratory failure, Pna. Intubated 3/14. Extubated 3/15. Hx of COPD, CAD, CVA, prostate cancer, anemia, MI, bil foot drop, neuropathy, vertigo    PT Comments    Pt progressing slowly.  Physically strong however limited due to dyspnea.  Amb on RA sats avg 94 but still present with coughing/congestion.   Pt will need ST Rehab at SNF prior to returning safely home.     Follow Up Recommendations  SNF     Equipment Recommendations  None recommended by PT    Recommendations for Other Services       Precautions / Restrictions Precautions Precautions: Fall Precaution Comments: bil LE AFO shoes Restrictions Weight Bearing Restrictions: No    Mobility  Bed Mobility Overal bed mobility: Needs Assistance Bed Mobility: Supine to Sit     Supine to sit: HOB elevated;Min guard     General bed mobility comments: increased time  Transfers Overall transfer level: Needs assistance Equipment used: Rolling walker (2 wheeled) Transfers: Sit to/from Stand Sit to Stand: Min assist         General transfer comment: Assist to rise, stabilize, control descent. VCs safety, hand placement. Total assist to don bil AFOs EOB  Ambulation/Gait Ambulation/Gait assistance: Min assist;Mod assist Ambulation Distance (Feet): 68 Feet Assistive device: Rolling walker (2 wheeled) Gait Pattern/deviations: Step-through pattern;Decreased stride length;Trunk flexed     General Gait Details: slow gait with limited activity tolerance.  Noted cough/congestion.     Stairs            Wheelchair Mobility    Modified Rankin (Stroke Patients Only)       Balance                                    Cognition Arousal/Alertness: Awake/alert                          Exercises      General  Comments        Pertinent Vitals/Pain Pain Assessment: No/denies pain    Home Living                      Prior Function            PT Goals (current goals can now be found in the care plan section) Progress towards PT goals: Progressing toward goals    Frequency  Min 3X/week    PT Plan Current plan remains appropriate    Co-evaluation             End of Session Equipment Utilized During Treatment: Gait belt Activity Tolerance: Patient tolerated treatment well Patient left: in chair;with call bell/phone within reach;with family/visitor present     Time: 1055-1120 PT Time Calculation (min) (ACUTE ONLY): 25 min  Charges:  $Gait Training: 8-22 mins $Therapeutic Activity: 8-22 mins                    G Codes:      Rica Koyanagi  PTA [facility]  Acute  Rehab Pager      [phone]

## 2015-11-01 NOTE — Clinical Social Work Placement (Signed)
   CLINICAL SOCIAL WORK PLACEMENT  NOTE  Date:  11/01/2015  Patient Details  Name: [name] MRN: [id number] Date of Birth: 08/25/1923  Clinical Social Work is seeking post-discharge placement for this patient at the [place] level of care (*CSW will initial, date and re-position this form in  chart as items are completed):  Yes   Patient/family provided with [facility] Work Department's list of facilities offering this level of care within the geographic area requested by the patient (or if unable, by the patient's family).  Yes   Patient/family informed of their freedom to choose among providers that offer the needed level of care, that participate in Medicare, Medicaid or managed care program needed by the patient, have an available bed and are willing to accept the patient.  Yes   Patient/family informed of [facility]'s ownership interest in [facility] and [facility], as well as of the fact that they are under no obligation to receive care at these facilities.  PASRR submitted to EDS on       PASRR number received on       Existing PASRR number confirmed on 11/01/15     FL2 transmitted to all facilities in geographic area requested by pt/family on 11/01/15     FL2 transmitted to all facilities within larger geographic area on 11/01/15     Patient informed that his/her managed care company has contracts with or will negotiate with certain facilities, including the following:            Patient/family informed of bed offers received.  Patient chooses bed at       Physician recommends and patient chooses bed at      Patient to be transferred to   on  .  Patient to be transferred to facility by       Patient family notified on   of transfer.  Name of family member notified:        PHYSICIAN       Additional Comment:    _______________________________________________ Harlon Flor, Student-SW 11/01/2015, 3:12 PM

## 2015-11-01 NOTE — Progress Notes (Signed)
Patient ID: [name], male   DOB: [birth year], 82 y.o.   MRN: [id number]  [facility]  Subjective: Patient up in chair, tolerating regular diet (roast beef and baked potatoe!).  No complaints.  Had formed BM yesterday.  Denies abd pain.  Objective: Vital signs in last 24 hours: Temp:  [97.6 F (36.4 C)-98.2 F (36.8 C)] 98.2 F (36.8 C) (03/20 0620) Pulse Rate:  [66-68] 68 (03/20 0620) Resp:  [18] 18 (03/20 0620) BP: (105-143)/(50-58) 143/58 mmHg (03/20 0620) SpO2:  [95 %-98 %] 95 % (03/20 0901) Weight:  [79.4 kg (175 lb 0.7 oz)] 79.4 kg (175 lb 0.7 oz) (03/20 0620) Last BM Date: 10/31/15  Intake/Output from previous day: 03/19 0701 - 03/20 0700 In: 960 [P.O.:960] Out: 1350 [Urine:1350] Intake/Output this shift:    Physical Exam: HEENT - sclerae clear, mucous membranes moist Abdomen - soft, protuberant, non-tender; no mass; active BS present Ext - no edema, non-tender Neuro - alert & oriented, no focal deficits  Lab Results:   Recent Labs  10/31/15 0549 11/01/15 0453  WBC 6.6 6.1  HGB 8.4* 8.5*  HCT 27.6* 27.7*  PLT 202 186   BMET  Recent Labs  10/31/15 0549 11/01/15 0453  NA 141 141  K 4.2 3.9  CL 106 105  CO2 27 29  GLUCOSE 188* 177*  BUN 35* 35*  CREATININE 1.28*  1.16 1.16  CALCIUM 8.3* 8.3*   PT/INR No results for input(s): LABPROT, INR in the last 72 hours. Comprehensive Metabolic Panel:    Component Value Date/Time   NA 141 11/01/2015 0453   NA 141 10/31/2015 0549   K 3.9 11/01/2015 0453   K 4.2 10/31/2015 0549   CL 105 11/01/2015 0453   CL 106 10/31/2015 0549   CO2 29 11/01/2015 0453   CO2 27 10/31/2015 0549   BUN 35* 11/01/2015 0453   BUN 35* 10/31/2015 0549   CREATININE 1.16 11/01/2015 0453   CREATININE 1.16 10/31/2015 0549   CREATININE 1.28* 10/31/2015 0549   CREATININE 1.10 04/24/2011 1200   GLUCOSE 177* 11/01/2015 0453   GLUCOSE 188* 10/31/2015 0549   CALCIUM 8.3* 11/01/2015 0453   CALCIUM 8.3* 10/31/2015 0549   AST 18 10/24/2015 1358   AST 15 09/21/2015 0501   ALT 12* 10/24/2015 1358   ALT 9* 09/21/2015 0501   ALKPHOS 56 10/24/2015 1358   ALKPHOS 73 09/21/2015 0501   BILITOT 0.8 10/24/2015 1358   BILITOT 0.7 09/21/2015 0501   PROT 6.1* 10/24/2015 1358   PROT 5.8* 09/21/2015 0501   ALBUMIN 2.6* 11/01/2015 0453   ALBUMIN 2.6* 10/31/2015 0549    Studies/Results: No results found.  Assessment & Plans: Colonic ileus - resolved  Patient taking regular diet and having formed BM  States he takes Miralax at home - would resume daily  If continued bowel function, would stop Reglan soon  Will sign off - call if needed  Earnstine Regal, MD, [facility] Office: [place] 11/01/2015

## 2015-11-01 NOTE — Care Management Important Message (Signed)
Important Message  Patient Details  Name: JACYN MUENCHOW MRN: [id number] Date of Birth: 1924-07-01   Medicare Important Message Given:  Yes    Camillo Flaming 11/01/2015, 11:14 AMImportant Message  Patient Details  Name: SHAINE ENYEART MRN: [id number] Date of Birth: Mar 11, 1924   Medicare Important Message Given:  Yes    Camillo Flaming 11/01/2015, 11:14 AM

## 2015-11-01 NOTE — Clinical Social Work Note (Signed)
Clinical Social Work Assessment  Patient Details  Name: [name] MRN: [id number] Date of Birth: [birth year]  Date of referral:  11/01/15               Reason for consult:  Facility Placement                Permission sought to share information with:  Facility Sport and exercise psychologist, Family Supports Permission granted to share information::  Yes, Verbal Permission Granted  Name::     [place]::  [facility] SNF search  Relationship::  Daughter  Contact Information:     Housing/Transportation Living arrangements for the past 2 months:  Single Family Home Source of Information:  Patient Patient Interpreter Needed:  None Criminal Activity/Legal Involvement Pertinent to Current Situation/Hospitalization:  No - Comment as needed Significant Relationships:  Adult Children Lives with:  Self Do you feel safe going back to the place where you live?  Yes Need for family participation in patient care:  No (Coment)  Care giving concerns:  Pt admitted from home alone. PT recommending short-term rehab at a SNF.   Social Worker assessment / plan:  CSW received PT recommendation. Per ND, pt potentially ready for dc tomorrow. BSW Intern met with pt at bedside. BSW Intern introduced self and explained role. Pt states he lives at home alone and has caregivers 5 days a week from 7am-7pm. Pt states family has made plans for pt to go to [facility] when his room is ready around April 12th. BSW Intern explained recommendation for short-term rehab at a SNF. Pt agreeable to a [facility] search. Pt requested BSW Intern to contact pt daughter, [name] to make aware of plans.  BSW Intern contacted pt daughter, [name] via telephone. BSW Intern confirmed [facility] arrangements in April. BSW Intern explained PT recommendation. Pt daughter agrees short-term rehab is a good idea but states pt makes the ultimate decision. Pt daughter discussed pt has been to [place]  previously for a knee replacement. Pt daughter suggested reminding pt of his stay at [facility] since he did not recall any facilities during assessment.    BSW Intern to completed FL2 and conduct [facility] search via [employer].  CSW to follow-up with bed offers.  CSW continuing to follow.   Employment status:  Retired Nurse, adult PT Recommendations:  [place] / Referral to community resources:  [place]  Patient/Family's Response to care:  Pt alert and oriented x4. Pt has impaired hearing but pleasant to speak with and active in conversation.  Patient/Family's Understanding of and Emotional Response to Diagnosis, Current Treatment, and Prognosis:  Pt and pt daughter report no further questions at this time.   Emotional Assessment Appearance:  Appears stated age Attitude/Demeanor/Rapport:  Other (Appropriate ) Affect (typically observed):  Accepting, Adaptable, Pleasant Orientation:  Oriented to Self, Oriented to Place, Oriented to  Time, Oriented to Situation Alcohol / Substance use:  Not Applicable Psych involvement (Current and /or in the community):  No (Comment)  Discharge Needs  Concerns to be addressed:  Care Coordination Readmission within the last 30 days:  Yes Current discharge risk:  None Barriers to Discharge:  Continued Medical Work up   [employer], Student-SW 11/01/2015, 2:58 PM

## 2015-11-01 NOTE — NC FL2 (Signed)
[place] LEVEL OF CARE SCREENING TOOL     IDENTIFICATION  Patient Name: [name] Birthdate: [birth year] Sex: male Admission Date (Current Location): 10/24/2015  [facility] and [place] Number:  Herbalist and Address:  [facility],  [place] [address] [place]      Provider Number: [id number]  Attending Physician Name and Address:  Lavina Hamman, MD  Relative Name and Phone Number:       Current Level of Care: Hospital Recommended Level of Care: [place] Prior Approval Number:    Date Approved/Denied:   PASRR Number: [id number]  Discharge Plan: SNF    Current Diagnoses: Patient Active Problem List   Diagnosis Date Noted  . Acute respiratory failure with hypoxia ([place]) 10/29/2015  . HCAP (healthcare-associated pneumonia) 10/24/2015  . AKI (acute kidney injury) ([place]) 10/24/2015  . Ileus ([place]) 10/19/2015  . Stage III chronic kidney disease 10/19/2015  . Anemia, iron deficiency 10/19/2015  . COPD (chronic obstructive pulmonary disease) ([place]) 10/19/2015  . Essential hypertension 10/19/2015  . SBO (small bowel obstruction) ([place]) 10/15/2015  . Diarrhea 10/15/2015  . Near syncope 09/20/2015  . Acute kidney injury ([place]) 09/20/2015  . Syncope 09/20/2015  . Hereditary and idiopathic peripheral neuropathy 03/01/2015  . Abnormality of gait 03/01/2015  . Acute on chronic combined systolic and diastolic congestive heart failure, NYHA class 3 ([place])   . Acute pulmonary edema (HCC)   . Pneumonia   . E-coli UTI   . Acute respiratory failure ([place]) 01/08/2015  . Carotid stenosis 09/28/2014  . Aftercare following surgery of the circulatory system, [place] 01/27/2014  . Chronic systolic CHF (congestive heart failure) ([place]) 11/17/2013  . Cough 11/13/2013  . Microcytic anemia 10/31/2013  . COPD GOLD III 10/30/2013  . Macular pucker 02/27/2012  . HYPERTENSION, BENIGN 04/21/2009  . CAROTID ARTERY STENOSIS, WITHOUT INFARCTION  04/21/2009  . DIASTOLIC HEART FAILURE, CHRONIC 01/01/2009  . EDEMA 01/01/2009  . CAD, NATIVE VESSEL 11/06/2008    Orientation RESPIRATION BLADDER Height & Weight     Self, Time, Situation, Place   (OSA, CPAP at night) Incontinent Weight: 175 lb 0.7 oz (79.4 kg) Height:  6' (182.9 cm)  BEHAVIORAL SYMPTOMS/MOOD NEUROLOGICAL BOWEL NUTRITION STATUS  Other (Comment) (None)  (N/a) Continent Diet (Diet Regular)  AMBULATORY STATUS COMMUNICATION OF NEEDS Skin   Limited Assist Verbally Normal                       Personal Care Assistance Level of Assistance  Bathing, Feeding, Dressing Bathing Assistance: Limited assistance Feeding assistance: Independent Dressing Assistance: Limited assistance     Functional Limitations Info  Sight, Hearing, Speech Sight Info: Impaired (Wears glasses) Hearing Info: Impaired (Hearing aid) Speech Info: Adequate    SPECIAL CARE FACTORS FREQUENCY  PT (By licensed PT), OT (By licensed OT)     PT Frequency: 5 x a week OT Frequency: 5 x a week            Contractures Contractures Info: Not present    Additional Factors Info  Code Status, Allergies Code Status Info: FULL Allergies Info: Clopidogrel Bisulfate, Dilaudid, Penicillins, [place] Antibiotics, Sulfonamide Derivatives, Doxycycline Hyclate           Current Medications (11/01/2015):  This is the current hospital active medication list Current Facility-Administered Medications  Medication Dose Route Frequency Provider Last Rate Last Dose  . 0.9 %  sodium chloride infusion   Intravenous Continuous Donita Brooks, NP 10  mL/hr at 10/28/15 1003    . acetaminophen (TYLENOL) tablet 650 mg  650 mg Oral Q6H PRN Kelvin Cellar, MD       Or  . acetaminophen (TYLENOL) suppository 650 mg  650 mg Rectal Q6H PRN Kelvin Cellar, MD      . albuterol (PROVENTIL) (2.5 MG/3ML) 0.083% nebulizer solution 2.5 mg  2.5 mg Nebulization Q4H PRN Lavina Hamman, MD      . alum & mag hydroxide-simeth  (MAALOX/MYLANTA) 200-200-20 MG/5ML suspension 30 mL  30 mL Oral Q6H PRN Kelvin Cellar, MD      . antiseptic oral rinse (CPC / CETYLPYRIDINIUM CHLORIDE 0.05%) solution 7 mL  7 mL Mouth Rinse q12n4p Kelvin Cellar, MD   7 mL at 11/01/15 1202  . aspirin chewable tablet 81 mg  81 mg Per Tube Daily Javier Glazier, MD   81 mg at 11/01/15 1005  . carvedilol (COREG) tablet 12.5 mg  12.5 mg Oral BID WC Donita Brooks, NP   12.5 mg at 11/01/15 0807  . chlorhexidine (PERIDEX) 0.12 % solution 15 mL  15 mL Mouth Rinse BID Kelvin Cellar, MD   15 mL at 11/01/15 1005  . enoxaparin (LOVENOX) injection 40 mg  40 mg Subcutaneous Q24H Kelvin Cellar, MD   40 mg at 10/31/15 1714  . furosemide (LASIX) tablet 20 mg  20 mg Oral Daily Lavina Hamman, MD   20 mg at 11/01/15 1005  . insulin aspart (novoLOG) injection 0-5 Units  0-5 Units Subcutaneous QHS Lavina Hamman, MD   0 Units at 10/30/15 0014  . insulin aspart (novoLOG) injection 0-9 Units  0-9 Units Subcutaneous TID WC Lavina Hamman, MD   3 Units at 11/01/15 1402  . ipratropium-albuterol (DUONEB) 0.5-2.5 (3) MG/3ML nebulizer solution 3 mL  3 mL Nebulization TID Lavina Hamman, MD   3 mL at 11/01/15 1423  . losartan (COZAAR) tablet 25 mg  25 mg Oral Daily Donita Brooks, NP   25 mg at 11/01/15 1005  . metoCLOPramide (REGLAN) injection 10 mg  10 mg Intravenous 3 times per day Lavina Hamman, MD   10 mg at 11/01/15 1402  . ondansetron (ZOFRAN) tablet 4 mg  4 mg Oral Q6H PRN Kelvin Cellar, MD       Or  . ondansetron (ZOFRAN) injection 4 mg  4 mg Intravenous Q6H PRN Kelvin Cellar, MD      . pantoprazole (PROTONIX) EC tablet 40 mg  40 mg Oral Q1200 Donita Brooks, NP   40 mg at 11/01/15 1202  . predniSONE (DELTASONE) tablet 50 mg  50 mg Oral Q breakfast Lavina Hamman, MD   50 mg at 11/01/15 0806  . pregabalin (LYRICA) capsule 75 mg  75 mg Per Tube BID Javier Glazier, MD   75 mg at 11/01/15 1005  . simvastatin (ZOCOR) tablet 10 mg  10 mg Per Tube q1800  Javier Glazier, MD   10 mg at 10/31/15 1714  . sodium chloride flush (NS) 0.9 % injection 3 mL  3 mL Intravenous Q12H Kelvin Cellar, MD   3 mL at 11/01/15 1006     Discharge Medications: Please see discharge summary for a list of discharge medications.  Relevant Imaging Results:  Relevant Lab Results:   Additional Information SSN; [id number]  Harlon Flor, Student-SW [phone]

## 2015-11-02 LAB — CBC WITH DIFFERENTIAL/PLATELET
BASOS ABS: 0 10*3/uL (ref 0.0–0.1)
BASOS PCT: 0 %
Eosinophils Absolute: 0 10*3/uL (ref 0.0–0.7)
Eosinophils Relative: 0 %
HEMATOCRIT: 27.8 % — AB (ref 39.0–52.0)
HEMOGLOBIN: 8.5 g/dL — AB (ref 13.0–17.0)
LYMPHS PCT: 14 %
Lymphs Abs: 1 10*3/uL (ref 0.7–4.0)
MCH: 19.3 pg — AB (ref 26.0–34.0)
MCHC: 30.6 g/dL (ref 30.0–36.0)
MCV: 63.2 fL — ABNORMAL LOW (ref 78.0–100.0)
MONOS PCT: 6 %
Monocytes Absolute: 0.4 10*3/uL (ref 0.1–1.0)
NEUTROS ABS: 5.7 10*3/uL (ref 1.7–7.7)
Neutrophils Relative %: 80 %
Platelets: 179 10*3/uL (ref 150–400)
RBC: 4.4 MIL/uL (ref 4.22–5.81)
RDW: 16.5 % — ABNORMAL HIGH (ref 11.5–15.5)
WBC: 7.1 10*3/uL (ref 4.0–10.5)

## 2015-11-02 LAB — RENAL FUNCTION PANEL
Albumin: 2.4 g/dL — ABNORMAL LOW (ref 3.5–5.0)
Anion gap: 6 (ref 5–15)
BUN: 31 mg/dL — AB (ref 6–20)
CHLORIDE: 105 mmol/L (ref 101–111)
CO2: 30 mmol/L (ref 22–32)
Calcium: 8.1 mg/dL — ABNORMAL LOW (ref 8.9–10.3)
Creatinine, Ser: 1.29 mg/dL — ABNORMAL HIGH (ref 0.61–1.24)
GFR calc Af Amer: 55 mL/min — ABNORMAL LOW (ref >60)
GFR calc non Af Amer: 47 mL/min — ABNORMAL LOW (ref >60)
GLUCOSE: 150 mg/dL — AB (ref 65–99)
POTASSIUM: 3.8 mmol/L (ref 3.5–5.1)
Phosphorus: 2.3 mg/dL — ABNORMAL LOW (ref 2.5–4.6)
Sodium: 141 mmol/L (ref 135–145)

## 2015-11-02 LAB — GLUCOSE, CAPILLARY
Glucose-Capillary: 106 mg/dL — ABNORMAL HIGH (ref 65–99)
Glucose-Capillary: 230 mg/dL — ABNORMAL HIGH (ref 65–99)

## 2015-11-02 LAB — MAGNESIUM: Magnesium: 2.1 mg/dL (ref 1.7–2.4)

## 2015-11-02 MED ORDER — FERROUS SULFATE 325 (65 FE) MG PO TABS: 325.0000 mg | ORAL_TABLET | Freq: Two times a day (BID) | ORAL | Status: DC

## 2015-11-02 MED ORDER — SENNOSIDES-DOCUSATE SODIUM 8.6-50 MG PO TABS: 1.0000 | ORAL_TABLET | Freq: Two times a day (BID) | ORAL | Status: AC

## 2015-11-02 MED ORDER — METOCLOPRAMIDE HCL 10 MG PO TABS
5.0000 mg | ORAL_TABLET | Freq: Three times a day (TID) | ORAL | Status: DC
  Administered 2015-11-02: 5 mg via ORAL
  Filled 2015-11-02: qty 1

## 2015-11-02 MED ORDER — PREDNISONE 20 MG PO TABS: 40.0000 mg | ORAL_TABLET | Freq: Every day | ORAL | Status: DC

## 2015-11-02 MED ORDER — FUROSEMIDE 20 MG PO TABS: 20.0000 mg | ORAL_TABLET | Freq: Every day | ORAL | Status: DC

## 2015-11-02 MED ORDER — METOCLOPRAMIDE HCL 5 MG PO TABS: 5.0000 mg | ORAL_TABLET | Freq: Three times a day (TID) | ORAL | Status: DC

## 2015-11-02 MED ORDER — PREDNISONE 10 MG PO TABS: ORAL_TABLET | ORAL | Status: DC

## 2015-11-02 MED ORDER — POLYETHYLENE GLYCOL 3350 17 G PO PACK: 17.0000 g | PACK | Freq: Two times a day (BID) | ORAL | Status: AC

## 2015-11-02 NOTE — Clinical Social Work Placement (Signed)
   CLINICAL SOCIAL WORK PLACEMENT  NOTE  Date:  11/02/2015  Patient Details  Name: [name] MRN: [id number] Date of Birth: 01-06-1924  Clinical Social Work is seeking post-discharge placement for this patient at the [place] level of care (*CSW will initial, date and re-position this form in  chart as items are completed):  Yes   Patient/family provided with [place] Work Department's list of facilities offering this level of care within the geographic area requested by the patient (or if unable, by the patient's family).  Yes   Patient/family informed of their freedom to choose among providers that offer the needed level of care, that participate in Medicare, Medicaid or managed care program needed by the patient, have an available bed and are willing to accept the patient.  Yes   Patient/family informed of [place]'s ownership interest in [facility] and [facility], as well as of the fact that they are under no obligation to receive care at these facilities.  PASRR submitted to EDS on       PASRR number received on       Existing PASRR number confirmed on 11/01/15     FL2 transmitted to all facilities in geographic area requested by pt/family on 11/01/15     FL2 transmitted to all facilities within larger geographic area on 11/01/15     Patient informed that his/her managed care company has contracts with or will negotiate with certain facilities, including the following:        Yes   Patient/family informed of bed offers received.  Patient chooses bed at [facility] recommends and patient chooses bed at      Patient to be transferred to [facility] on 11/02/15.  Patient to be transferred to facility by pt daughter via private vehicle     Patient family notified on 11/02/15 of transfer.  Name of family member notified:  pt notified at bedside and pt daughter, [name] notified and pt  daughter providing transport to [place]       Additional Comment:    _______________________________________________ Ladell Pier, LCSW 11/02/2015, 3:36 PM

## 2015-11-02 NOTE — Discharge Summary (Signed)
[facility] Discharge Summary   Patient: [name] [id number]   PCP:  Melinda Crutch, MD DOB: 05-May-1924   Date of admission: 10/24/2015   Date of discharge:  11/02/2015    Discharge Diagnoses:  Principal Problem:   Acute respiratory failure with hypoxia ([place]) Active Problems:   CAD, NATIVE VESSEL   Acute on chronic combined systolic and diastolic congestive heart failure, NYHA class 3 (HCC)   Ileus (HCC)   Stage III chronic kidney disease   COPD (chronic obstructive pulmonary disease) ([place])   Essential hypertension   HCAP (healthcare-associated pneumonia)   AKI (acute kidney injury) ([place])  Recommendations for Outpatient Follow-up:  1. Please follow up with PCP in 1 week   Follow-up Information    Follow up with  Melinda Crutch, MD. Schedule an appointment as soon as possible for a visit in 1 week.   Specialty:  Family Medicine   Contact information:   [place] [address] [phone]      Diet recommendation: heart healthy diet  Activity: The patient is advised to gradually reintroduce usual activities.  Discharge Condition: fair  History of present illness: As per the H and P dictated on admission, "[name] is a 82 y.o. male With a past medical history of chronic obstructive pulmonary disease, Stage III chronic disease, hypertension, recently admitted to the medicine service from 10/14/2015 through 10/23/2015 where He was treated for small bowel obstruction/ileus. He showed clinical improvement with advancement of diet and was discharged home in stable condition. In the last 24 hours he has Developed worsening Shortness of breath associated with cough, having a thick green/yellow sputum production, wheezing, generalized weakness, malaise, poor appetite. A chest x-ray performed in the emergency department revealed presence of lower lobe airspace disease concerning for healthcare associated pneumonia."  Hospital Course:  Summary of his active  problems in the hospital is as following. 1. Acute respiratory failure with hypoxia ([place]) Healthcare associated pneumonia and COPD exacerbation Acute on chronic combined CHF. Patient was initially on dorsum recommended antibiotics as well as IV steroids. Despite the treatment he did not improve and had significant respiratory acidosis. The patient was transferred to ICU and was intubated. Extubated the next day. Completed antibiotics, treated with duoneb. Switched to oral prednisone taper. Negative sputum culture Restart low-dose Lasix. Continue C Pap daily at bedtime for sleep apnea.  2. Ileus. Resolved Patient had not had any bowel movement since 10/23/2015. Appreciated general surgery. Switch to regular diet Increase ambulation Add MiraLAX and Senokot. Continue Reglan for one week on discharge.  3. Obstructive sleep apnea. Continue CPAP.  4. Essential hypertension EF 40-45%. Continue Coreg and Cozaar. Resume low-dose Lasix. Continue Zocor and aspirin.  5. Chronic kidney disease stage III. Renal function stable today to monitor  6. Pedal edema. Lower extremity Doppler negative for DVT. Continue monitoring Encourage ambulation.  7. Chronic anemia. No evidence of acute worsening. Started on iron supplementation   All other chronic medical condition were stable during the hospitalization.  Patient was seen by physical therapy, who recommended SNF, which was arranged by Education officer, museum and case Freight forwarder. On the day of the discharge the patient's vitals were stable, and no other acute medical condition were reported by patient. the patient was felt safe to be discharge at SNF with physical therapy.  Procedures and Results:   Intubation  Echocardiogram Study Conclusions  - Left ventricle: The cavity size was normal. Systolic function was  mildly to moderately reduced. The estimated ejection  fraction was  in the range of 40% to 45%. There is akinesis of the   mid-apicalanteroseptal and apical myocardium. Doppler parameters  are consistent with abnormal left ventricular relaxation (grade 1  diastolic dysfunction). - Aortic valve: Trileaflet; mildly thickened, mildly calcified  leaflets.  Consultations:  Critical care, general surgery  DISCHARGE MEDICATION: Current Discharge Medication List    START taking these medications   Details  ferrous sulfate 325 (65 FE) MG tablet Take 1 tablet (325 mg total) by mouth 2 (two) times daily with a meal. Qty: 30 tablet, Refills: 0    metoCLOPramide (REGLAN) 5 MG tablet Take 1 tablet (5 mg total) by mouth 3 (three) times daily before meals. Qty: 12 tablet, Refills: 0    polyethylene glycol (MIRALAX / GLYCOLAX) packet Take 17 g by mouth 2 (two) times daily. Qty: 14 each, Refills: 0    predniSONE (DELTASONE) 10 MG tablet Take 40mg  daily for 3days,Take 30mg  daily for 3days,Take 20mg  daily for 3days,Take 10mg  daily for 3days, then stop. Qty: 30 tablet, Refills: 0    senna-docusate (SENOKOT-S) 8.6-50 MG tablet Take 1 tablet by mouth 2 (two) times daily. Qty: 14 tablet, Refills: 0      CONTINUE these medications which have CHANGED   Details  furosemide (LASIX) 20 MG tablet Take 1 tablet (20 mg total) by mouth daily. Qty: 30 tablet, Refills: 0      CONTINUE these medications which have NOT CHANGED   Details  acetaminophen (TYLENOL) 325 MG tablet Take 650 mg by mouth every 6 (six) hours as needed for mild pain.     aspirin EC 81 MG tablet Take 81 mg by mouth daily.    carvedilol (COREG) 12.5 MG tablet TAKE 1 TABLET TWICE DAILY WITH A MEAL Qty: 60 tablet, Refills: 11    Glycopyrrolate-Formoterol (BEVESPI AEROSPHERE) 9-4.8 MCG/ACT AERO Inhale 2 puffs into the lungs every 12 (twelve) hours.    LINZESS 145 MCG CAPS capsule Take 1 capsule by mouth daily with breakfast. Refills: 11    losartan (COZAAR) 25 MG tablet Take 1 tablet (25 mg total) by mouth daily. Qty: 30 tablet, Refills: 0      meclizine (ANTIVERT) 25 MG tablet Take 1 tablet (25 mg total) by mouth daily as needed for dizziness. Qty: 30 tablet, Refills: 6    mometasone-formoterol (DULERA) 200-5 MCG/ACT AERO Inhale 2 puffs into the lungs 2 (two) times daily. Qty: 1 Inhaler, Refills: 5    MYRBETRIQ 50 MG TB24 tablet Take 50 mg by mouth daily. Refills: 11    NITROSTAT 0.4 MG SL tablet PLACE 1 TABLET (0.4 MG TOTAL) UNDER THE TONGUE EVERY 5 (FIVE) MINUTES AS NEEDED. FOR CHEST PAIN Qty: 25 tablet, Refills: 3    omeprazole (PRILOSEC) 40 MG capsule Take 1 capsule (40 mg total) by mouth daily. Qty: 90 capsule, Refills: 3    pregabalin (LYRICA) 75 MG capsule Take 75 mg by mouth 2 (two) times daily.     simvastatin (ZOCOR) 10 MG tablet Take 10 mg by mouth daily at 6 PM.     tiotropium (SPIRIVA) 18 MCG inhalation capsule Place 1 capsule (18 mcg total) into inhaler and inhale daily. Qty: 30 capsule, Refills: 0    VENTOLIN HFA 108 (90 BASE) MCG/ACT inhaler INHALE 2 PUFFS INTO THE LUNGS EVERY 4 HOURS AS NEEDED FOR WHEEZING OR SHORTNESS OF BREATH Qty: 1 Inhaler, Refills: 0      STOP taking these medications     polyethylene glycol powder (MIRALAX) powder  SYMBICORT 160-4.5 MCG/ACT inhaler      vitamin C (ASCORBIC ACID) 500 MG tablet        Allergies  Allergen Reactions  . Clopidogrel Bisulfate Hives    REACTION: red blue, black blotches  . Dilaudid [Hydromorphone Hcl] Hives    Hallucinations, little green men working on the building  . Penicillins Hives    Has patient had a PCN reaction causing immediate rash, facial/tongue/throat swelling, SOB or lightheadedness with hypotension: Yes Has patient had a PCN reaction causing severe rash involving mucus membranes or skin necrosis: No Has patient had a PCN reaction that required hospitalization No Has patient had a PCN reaction occurring within the last 10 years: No If all of the above answers are "NO", then may proceed with Cephalosporin use.   . Sulfa  Antibiotics Other (See Comments)    Nightmares, hallucinations  . Sulfonamide Derivatives Other (See Comments)    Nightmares, hallucinations  . Doxycycline Hyclate Rash   Discharge Instructions    Diet - low sodium heart healthy    Complete by:  As directed      Increase activity slowly    Complete by:  As directed           Discharge Exam: Filed Weights   10/31/15 0609 11/01/15 0620 11/02/15 0626  Weight: 79.5 kg (175 lb 4.3 oz) 79.4 kg (175 lb 0.7 oz) 79.5 kg (175 lb 4.3 oz)   Filed Vitals:   11/01/15 2314 11/02/15 0626  BP:  121/69  Pulse: 72 65  Temp:  97.1 F (36.2 C)  Resp: 16 18   General: Appear in mild distress, no Rash; Oral Mucosa moist. Cardiovascular: S1 and S2 Present, no Murmur, no JVD Respiratory: Bilateral Air entry present and basal Crackles, no wheezes Abdomen: Bowel Sound present, Soft and no tenderness Extremities: no Pedal edema, no calf tenderness Neurology: Grossly no focal neuro deficit.  The results of significant diagnostics from this hospitalization (including imaging, microbiology, ancillary and laboratory) are listed below for reference.    Significant Diagnostic Studies: Ct Abdomen Pelvis Wo Contrast  10/15/2015  CLINICAL DATA:  Acute onset of generalized abdominal pain and diarrhea. Initial encounter. EXAM: CT ABDOMEN AND PELVIS WITHOUT CONTRAST TECHNIQUE: Multidetector CT imaging of the abdomen and pelvis was performed following the standard protocol without IV contrast. COMPARISON:  CT of the abdomen and pelvis performed 09/12/2012 FINDINGS: Minimal bibasilar atelectasis or scarring is noted. Diffuse coronary artery calcifications are seen. There is marked thinning of the myocardium at the ventricular apex, with associated calcification and mild focal bulging, reflecting prior myocardial infarction. Calcification is noted at the aortic and mitral valves. Scattered calcified granulomata are noted within the liver. The liver and spleen are  otherwise grossly unremarkable. The patient is status post cholecystectomy, with clips noted along the gallbladder fossa. The pancreas and adrenal glands are grossly unremarkable. A few scattered bilateral renal cysts are seen, measuring up to 3.8 cm in size. There is question of minimal associated calcification at a small right renal cyst. Nonspecific perinephric stranding is noted bilaterally. Minimal right-sided hydronephrosis is nonspecific. No definite distal obstruction is seen. Scattered vascular calcifications are seen at the renal hila. No free fluid is identified. The small bowel is unremarkable in appearance. The stomach is within normal limits. No acute vascular abnormalities are seen. Relatively diffuse calcification is noted along the abdominal aorta and its branches. There is minimal ectasia of the infrarenal abdominal aorta, without evidence of aneurysmal dilatation. The patient is status post appendectomy.  The colon is largely filled with fluid, corresponding to the patient's diarrhea. The bladder is mildly distended and grossly unremarkable. A reservoir is noted at the right hemipelvis. Scattered brachytherapy seeds are noted at the prostate bed. No inguinal lymphadenopathy is seen. No acute osseous abnormalities are identified. Multilevel vacuum phenomenon is noted along the lumbar spine. There is minimal grade 1 retrolisthesis of L3 on L4, and minimal grade 1 anterolisthesis of L4 on L5, reflecting underlying facet disease. IMPRESSION: 1. Colon largely filled with fluid, corresponding to the patient's diarrhea. 2. Marked thinning of the myocardium at the ventricular apex, with associated calcification and mild focal bulging, reflecting prior myocardial infarction. 3. Diffuse coronary artery calcifications seen. Calcification at the aortic and mitral valves. 4. Few scattered bilateral renal cysts noted. Minimal nonspecific right-sided hydronephrosis, without evidence of distal obstruction. 5.  Relatively diffuse calcification along the abdominal aorta and its branches. 6. Mild degenerative change along the lumbar spine. Electronically Signed   By: Garald Balding M.D.   On: 10/15/2015 06:13   Dg Chest 2 View  10/25/2015  CLINICAL DATA:  Shortness of breath and cough EXAM: CHEST  2 VIEW COMPARISON:  10/24/15 FINDINGS: Cardiac shadow is stable. The lungs are well aerated bilaterally. Small effusions are again noted. No focal confluent infiltrate is seen. IMPRESSION: Small bilateral pleural effusions. Improved aeration in the bases is noted. Electronically Signed   By: Inez Catalina M.D.   On: 10/25/2015 10:07   Dg Chest 2 View  10/24/2015  CLINICAL DATA:  Productive cough for 2-3 days. EXAM: CHEST - 2 VIEW COMPARISON:  Two-view chest x-ray 09/20/2015. FINDINGS: The heart size is normal. Small bilateral pleural effusions are now present. Mild bibasilar airspace opacities are evident posteriorly. The upper lung fields are clear. Emphysematous changes are again noted. Atherosclerotic calcifications are present in the aortic arch. Surgical clips are present in the right neck and at the gallbladder fossa. IMPRESSION: 1. New bilateral pleural effusions and posterior lower lobe airspace disease. While this may represent atelectasis, infection is not excluded. 2. Emphysema. 3. Atherosclerosis of the thoracic aorta. Electronically Signed   By: San Morelle M.D.   On: 10/24/2015 13:38   Dg Chest Port 1 View  10/28/2015  CLINICAL DATA:  COPD exacerbation.  Short of breath EXAM: PORTABLE CHEST 1 VIEW COMPARISON:  10/26/2015 FINDINGS: Endotracheal tube and NG tubes have been removed. Bilateral airspace disease in the bases left greater than right shows interval improvement. Minimal pleural effusion bilaterally. IMPRESSION: Interval improvement in bilateral airspace disease consistent with clearing edema. Endotracheal tube removed in the interval. Electronically Signed   By: Franchot Gallo M.D.   On:  10/28/2015 07:33   Dg Chest Port 1 View  10/26/2015  CLINICAL DATA:  Intubation EXAM: PORTABLE CHEST 1 VIEW COMPARISON:  Yesterday FINDINGS: New endotracheal tube with tip between the clavicular heads and carina. An orogastric tube reaches stomach at least. Diffuse interstitial opacities are improved. Kerley lines are still noted. There is asymmetric airspace disease to the left. No definitive effusion. When accounting for a skin fold on the left there is no visible pneumothorax. IMPRESSION: 1. New endotracheal and orogastric tubes are in good position. 2. Pulmonary edema with improvement since earlier. 3. Airspace disease which could be asymmetric alveolar edema or superimposed pneumonia. Electronically Signed   By: Monte Fantasia M.D.   On: 10/26/2015 02:00   Dg Chest Port 1 View  10/26/2015  CLINICAL DATA:  Acute onset of shortness of breath. Initial encounter. EXAM: PORTABLE  CHEST 1 VIEW COMPARISON:  Chest radiograph performed earlier today at 9:24 a.m. FINDINGS: The lungs are hyperexpanded. The lung bases are incompletely imaged on this study. Vascular congestion is noted. Bibasilar airspace opacities likely reflect pulmonary edema, significantly worsened from the recent prior study, though pneumonia could have a similar appearance. No definite pleural effusion or pneumothorax is seen. The cardiomediastinal silhouette is within normal limits. No acute osseous abnormalities are seen. IMPRESSION: Lungs hypoexpanded. Vascular congestion noted. Bibasilar airspace opacities likely reflect pulmonary edema, significantly worsened from the prior study, though pneumonia could have a similar appearance. Electronically Signed   By: Garald Balding M.D.   On: 10/26/2015 00:06   Dg Abd 2 Views  10/29/2015  CLINICAL DATA:  Partial small bowel obstruction. EXAM: ABDOMEN - 2 VIEW COMPARISON:  10/26/2015 FINDINGS: Nasogastric tube no longer visualized. Gaseous dilatation of small bowel remains with some air also  present throughout most of the colon. Dilatation of small bowel slightly worse compared to 3/14. Findings remain consistent with partial small bowel obstruction versus generalized ileus. Decubitus film shows no evidence of free intraperitoneal air. No abnormal calcifications identified. IMPRESSION: Slight worsening of partial small bowel obstruction versus ileus. Electronically Signed   By: Aletta Edouard M.D.   On: 10/29/2015 11:39   Dg Abd 2 Views  10/17/2015  CLINICAL DATA:  Follow-up small bowel obstruction EXAM: ABDOMEN - 2 VIEW COMPARISON:  10/16/2015 FINDINGS: Oral contrast is seen primarily throughout the large bowel with air-fluid levels throughout the large bowel. Contrast is seen into the distal sigmoid. Large bowel is relatively decompressed as compared to small bowel, with numerous distended loops of mid abdominal small bowel again identified measuring up to about 4.5 cm in diameter. Small bowel air-fluid levels are stable. There is opacity at the left lung base most consistent with atelectasis. IMPRESSION: Stable findings of small bowel obstruction Electronically Signed   By: Skipper Cliche M.D.   On: 10/17/2015 08:52   Dg Abd Acute W/chest  10/14/2015  CLINICAL DATA:  Acute onset of generalized abdominal bloating. Initial encounter. EXAM: DG ABDOMEN ACUTE W/ 1V CHEST COMPARISON:  Chest radiograph performed 09/20/2015 FINDINGS: The lungs are well-aerated and clear. There is no evidence of focal opacification, pleural effusion or pneumothorax. The cardiomediastinal silhouette is within normal limits. There is dilatation of small bowel loops up to 5.8 cm in maximal diameter, with associated air-fluid levels, concerning for small bowel obstruction. Residual air is still seen in the colon. No free intra-abdominal air is identified on the provided upright view. Clips are noted within the right upper quadrant, reflecting prior cholecystectomy. Clips are also noted at the right side of the neck. No  acute osseous abnormalities are seen; the sacroiliac joints are unremarkable in appearance. IMPRESSION: 1. Dilatation of small bowel loops up to 5.8 cm in maximal diameter, with air-fluid levels, concerning for relatively high-grade small bowel obstruction. 2. No acute cardiopulmonary process seen. These results were called by telephone at the time of interpretation on 10/14/2015 at 11:29 pm to [facility] in the [facility] ER, who verbally acknowledged these results. Electronically Signed   By: Garald Balding M.D.   On: 10/14/2015 23:30   Dg Abd Portable 1v  10/26/2015  CLINICAL DATA:  Followup partial small bowel obstruction. EXAM: PORTABLE ABDOMEN - 1 VIEW COMPARISON:  10/20/2015 and earlier, including CT abdomen and pelvis 10/15/2015. FINDINGS: Persisting gaseous distention of multiple loops of small bowel in the mid abdomen. Gas and stool in normal caliber colon. Nasogastric tube looped  in the stomach with its tip in the distal body. No suggestion of free air on the supine image. Prior cholecystectomy. Developing airspace opacities in both lung bases. IMPRESSION: 1. No significant change since yesterday in the partial small bowel obstruction. 2. Developing pneumonia in both lung bases. Electronically Signed   By: Evangeline Dakin M.D.   On: 10/26/2015 07:58   Dg Abd Portable 1v  10/20/2015  CLINICAL DATA:  Followup small bowel obstruction. EXAM: PORTABLE ABDOMEN - 1 VIEW COMPARISON:  10/17/2015.  CT, 10/15/2015. FINDINGS: Mild dilation of small bowel is seen centrally. There is air in stool in the colon as well as some contrast in the left colon. The degree of small bowel dilation is mildly decreased from the prior exam. No new abnormalities. IMPRESSION: 1. Mild improvement in the partial small bowel obstruction with less small bowel dilation noted on the current study. Electronically Signed   By: Lajean Manes M.D.   On: 10/20/2015 11:56   Dg Abd Portable 1v  10/16/2015  CLINICAL DATA:  Small bowel  obstruction. EXAM: PORTABLE ABDOMEN - 1 VIEW COMPARISON:  CT scan October 15, 2015 FINDINGS: There is contrast within the decompressed colon. Dilated loops of small bowel are seen diffusely, most prominent in the central and left abdomen. No free air, portal venous gas, or pneumatosis identified. No other acute abnormalities. IMPRESSION: Small bowel obstruction. Electronically Signed   By: Dorise Bullion III M.D   On: 10/16/2015 09:24    Microbiology: Recent Results (from the past 240 hour(s))  Culture, respiratory (NON-Expectorated)     Status: None   Collection Time: 10/26/15  2:51 AM  Result Value Ref Range Status   Specimen Description TRACHEAL ASPIRATE  Final   Special Requests NONE  Final   Gram Stain   Final    NO WBC SEEN NO SQUAMOUS EPITHELIAL CELLS SEEN NO ORGANISMS SEEN Performed at [facility]    Culture   Final    NORMAL OROPHARYNGEAL FLORA Performed at [facility]    Report Status 10/28/2015 FINAL  Final  MRSA PCR Screening     Status: None   Collection Time: 10/26/15  2:51 AM  Result Value Ref Range Status   MRSA by PCR NEGATIVE NEGATIVE Final    Comment:        The GeneXpert MRSA Assay (FDA approved for NASAL specimens only), is one component of a comprehensive MRSA colonization surveillance program. It is not intended to diagnose MRSA infection nor to guide or monitor treatment for MRSA infections.      Labs: CBC:  Recent Labs Lab 10/29/15 0424 10/30/15 0540 10/31/15 0549 11/01/15 0453 11/02/15 0512  WBC 7.9 6.4 6.6 6.1 7.1  NEUTROABS 7.1 5.7 5.8 5.1 5.7  HGB 8.2* 8.3* 8.4* 8.5* 8.5*  HCT 25.7* 26.6* 27.6* 27.7* 27.8*  MCV 59.8* 59.6* 62.4* 62.4* 63.2*  PLT 221 217 202 186 0000000   Basic Metabolic Panel:  Recent Labs Lab 10/29/15 0424 10/30/15 0540 10/31/15 0549 11/01/15 0453 11/02/15 0512  NA 141 141 141 141 141  K 4.0 4.0 4.2 3.9 3.8  CL 108 106 106 105 105  CO2 23 26 27 29 30   GLUCOSE 197* 168* 188* 177* 150*  BUN  39* 39* 35* 35* 31*  CREATININE 1.32* 1.36* 1.28*  1.16 1.16 1.29*  CALCIUM 8.4* 8.3* 8.3* 8.3* 8.1*  MG 2.2 2.1 2.0 2.0 2.1  PHOS 3.9 4.1 3.5 3.2 2.3*   Liver Function Tests:  Recent Labs Lab 10/29/15 0424  10/30/15 0540 10/31/15 0549 11/01/15 0453 11/02/15 0512  ALBUMIN 2.5* 2.5* 2.6* 2.6* 2.4*   No results for input(s): LIPASE, AMYLASE in the last 168 hours. No results for input(s): AMMONIA in the last 168 hours. Cardiac Enzymes: No results for input(s): CKTOTAL, CKMB, CKMBINDEX, TROPONINI in the last 168 hours. BNP (last 3 results)  Recent Labs  01/08/15 1000 09/20/15 1833 10/26/15 0245  BNP 439.7* 196.1* 631.1*   CBG:  Recent Labs Lab 11/01/15 1159 11/01/15 1707 11/01/15 2133 11/02/15 0744 11/02/15 1307  GLUCAP 209* 195* 167* 106* 230*   Time spent: 30 minutes  Signed:  Lorenso Quirino  [facility]  11/02/2015  , 2:13 PM

## 2015-11-02 NOTE — Progress Notes (Signed)
Patient discharged to [facility], daughter transported the patient. Tried to call report to SNF for report but call was just transferred from one phone to another 5X.

## 2015-11-02 NOTE — Progress Notes (Signed)
CSW continuing to follow.  Per MD, pt medically ready for dc today.  BSW Intern met with pt at bedside to provide bed offers. Pt chooses bed at [facility].   CSW contacted [facility] to confirm bed offer for today. CSW faxed pt information and received insurance auth.   BSW Intern faxed dc summary to [facility] via [employer]. BSW Intern provided RN with number to call for Report.   BSW Intern made pt daughter, [name] aware. Pt daughter to transport pt to [facility] this afternoon.   No further SW needs identified at this time.  BSW Intern signing off, Harlon Flor, [facility] Work Department  [phone]

## 2015-11-05 ENCOUNTER — Encounter: Payer: Self-pay | Admitting: Adult Health

## 2015-11-05 ENCOUNTER — Non-Acute Institutional Stay (SKILLED_NURSING_FACILITY): Payer: Medicare Other | Admitting: Adult Health

## 2015-11-05 DIAGNOSIS — I2510 Atherosclerotic heart disease of native coronary artery without angina pectoris: Secondary | ICD-10-CM | POA: Diagnosis not present

## 2015-11-05 DIAGNOSIS — D509 Iron deficiency anemia, unspecified: Secondary | ICD-10-CM | POA: Diagnosis not present

## 2015-11-05 DIAGNOSIS — G4733 Obstructive sleep apnea (adult) (pediatric): Secondary | ICD-10-CM | POA: Insufficient documentation

## 2015-11-05 DIAGNOSIS — K56609 Unspecified intestinal obstruction, unspecified as to partial versus complete obstruction: Secondary | ICD-10-CM

## 2015-11-05 DIAGNOSIS — I110 Hypertensive heart disease with heart failure: Secondary | ICD-10-CM | POA: Diagnosis not present

## 2015-11-05 DIAGNOSIS — I5043 Acute on chronic combined systolic (congestive) and diastolic (congestive) heart failure: Secondary | ICD-10-CM

## 2015-11-05 DIAGNOSIS — K5669 Other intestinal obstruction: Secondary | ICD-10-CM

## 2015-11-05 DIAGNOSIS — G609 Hereditary and idiopathic neuropathy, unspecified: Secondary | ICD-10-CM

## 2015-11-05 DIAGNOSIS — J441 Chronic obstructive pulmonary disease with (acute) exacerbation: Secondary | ICD-10-CM | POA: Diagnosis not present

## 2015-11-05 DIAGNOSIS — K567 Ileus, unspecified: Secondary | ICD-10-CM

## 2015-11-05 NOTE — Progress Notes (Signed)
Patient ID: [name], male   DOB: 1924/02/27, 82 y.o.   MRN: [id number]   Facility: [facility]       Allergies  Allergen Reactions  . Clopidogrel Bisulfate Hives    REACTION: red blue, black blotches  . Dilaudid [Hydromorphone Hcl] Hives    Hallucinations, little Lounette Sloan men working on the building  . Penicillins Hives    Has patient had a PCN reaction causing immediate rash, facial/tongue/throat swelling, SOB or lightheadedness with hypotension: Yes Has patient had a PCN reaction causing severe rash involving mucus membranes or skin necrosis: No Has patient had a PCN reaction that required hospitalization No Has patient had a PCN reaction occurring within the last 10 years: No If all of the above answers are "NO", then may proceed with Cephalosporin use.   . Sulfa Antibiotics Other (See Comments)    Nightmares, hallucinations  . Sulfonamide Derivatives Other (See Comments)    Nightmares, hallucinations  . Doxycycline Hyclate Rash    Chief Complaint  Patient presents with  . Hospitalization Follow-up    Hospital follow up    HPI:  He has been hospitalized for acute respiratory failure with copd and sleep apnea; pneumonia; acute on chronic diastolic and systolic heart failure; and ileus. He is here for short term rehab with is his goal to go to assisted living. He is not voicing any complaints or concerns at this time. There are no nursing concerns at this time.   Past Medical History  Diagnosis Date  . Carotid artery stenosis   . Essential hypertension   . Myocardial infarction ([place]) 1979  . Coronary artery disease     Known occluded proximal LAD with collaterals, moderate RCA disease - managed medically 2009  . COPD (chronic obstructive pulmonary disease) ([place])   . History of stroke   . Prostate cancer ([place])   . Arthritis   . Foot drop, right   . Neuropathy ([place])   . Anemia   . Urinary incontinence, male, stress   . GERD (gastroesophageal reflux disease)   .  Thalassemia   . Wears dentures   . Deafness in left ear     Wears hearing aide in right ear  . Sleep apnea     CPAP  . Ischemic cardiomyopathy     LVEF 40-45%  . Hypercholesteremia   . Anemia   . Osteoarthritis   . Stroke ([employer])   . Heart attack ([place])   . Vertigo   . Hereditary and idiopathic peripheral neuropathy 03/01/2015  . Abnormality of gait 03/01/2015    Past Surgical History  Procedure Laterality Date  . Penile prosthesis implant    . Cholecystectomy    . Right carotid endarterectomy    . Back surgery    . Lumbar laminectomy  03/24/10    L3-4 and L4-5  . Ankle surgery      Bone infection  . Cardiac catheterization    . Appendectomy    . Joint replacement Left   . Radioactive seed implant  2009  . Pars plana vitrectomy  03/19/2012    Procedure: PARS PLANA VITRECTOMY WITH 25 GAUGE;  Surgeon: Hayden Pedro, MD;  Location: [facility];  Service: Ophthalmology;  Laterality: Right;  . Carotid endarterectomy Right     CEA - 15 yrs ago  . Spine surgery    . Eye surgery    . Cataract extraction w/ intraocular lens implant Left   . Endarterectomy Left 09/28/2014    Procedure: Left Carotid Endarterectomy;  Surgeon: Rosetta Posner, MD;  Location: [facility];  Service: Vascular;  Laterality: Left;  . Patch angioplasty Left 09/28/2014    Procedure: WITH DACRON PATCH ANGIOPLASTY;  Surgeon: Rosetta Posner, MD;  Location: [facility];  Service: Vascular;  Laterality: Left;   Family History  Problem Relation Age of Onset  . Thalassemia      Family history  . Cancer Mother 26    Bone cancer  . Diabetes Mother   . Heart disease Mother   . Heart attack Mother   . Hypertension Mother   . Stroke Father 21  . Heart disease Father   . Hypertension Father   . Hypertension Sister   . Diabetes Sister   . Heart disease Brother   . Heart attack Brother 36  . Cancer Brother     Social History   Social History  . Marital Status: Married    Spouse Name: N/A  . Number of Children: 3  . Years of  Education: GED   Occupational History  . retired    Social History Main Topics  . Smoking status: Former Smoker -- 1.00 packs/day for 20 years    Types: Cigarettes    Quit date: 08/14/1977  . Smokeless tobacco: Never Used  . Alcohol Use: No     Comment: occasional  . Drug Use: No  . Sexual Activity: Not on file   Other Topics Concern  . Not on file   Social History Narrative   Patient does not drink caffeine.   Patient is left handed.     VITAL SIGNS BP 120/69 mmHg  Pulse 66  Temp(Src) 98 F (36.7 C) (Oral)  Resp 20  Ht 6' (1.829 m)  Wt 167 lb 4 oz (75.864 kg)  BMI 22.68 kg/m2  Patient's Medications  New Prescriptions   No medications on file  Previous Medications   ACETAMINOPHEN (TYLENOL) 325 MG TABLET    Take 650 mg by mouth every 6 (six) hours as needed for mild pain.    ASPIRIN EC 81 MG TABLET    Take 81 mg by mouth daily.   CARVEDILOL (COREG) 12.5 MG TABLET    TAKE 1 TABLET TWICE DAILY WITH A MEAL   FERROUS SULFATE 325 (65 FE) MG TABLET    Take 1 tablet (325 mg total) by mouth 2 (two) times daily with a meal.   FUROSEMIDE (LASIX) 20 MG TABLET    Take 1 tablet (20 mg total) by mouth daily.   GLYCOPYRROLATE-FORMOTEROL (BEVESPI AEROSPHERE) 9-4.8 MCG/ACT AERO    Inhale 2 puffs into the lungs every 12 (twelve) hours.   LINZESS 145 MCG CAPS CAPSULE    Take 1 capsule by mouth daily with breakfast.   LOSARTAN (COZAAR) 25 MG TABLET    Take 1 tablet (25 mg total) by mouth daily.   MECLIZINE (ANTIVERT) 25 MG TABLET    Take 1 tablet (25 mg total) by mouth daily as needed for dizziness.   METOCLOPRAMIDE (REGLAN) 5 MG TABLET    Take 1 tablet (5 mg total) by mouth 3 (three) times daily before meals.   MOMETASONE-FORMOTEROL (DULERA) 200-5 MCG/ACT AERO    Inhale 2 puffs into the lungs 2 (two) times daily.   MYRBETRIQ 50 MG TB24 TABLET    Take 50 mg by mouth daily.   NITROSTAT 0.4 MG SL TABLET    PLACE 1 TABLET (0.4 MG TOTAL) UNDER THE TONGUE EVERY 5 (FIVE) MINUTES AS NEEDED. FOR  CHEST PAIN   NUTRITIONAL SUPPLEMENTS (  NUTRITIONAL SUPPLEMENT PO)    Take 120 mLs by mouth 2 (two) times daily. Medpass   OMEPRAZOLE (PRILOSEC) 40 MG CAPSULE    Take 1 capsule (40 mg total) by mouth daily.   OXYGEN    Inhale into the lungs. 2L/min   POLYETHYLENE GLYCOL (MIRALAX / GLYCOLAX) PACKET    Take 17 g by mouth 2 (two) times daily.   PREGABALIN (LYRICA) 75 MG CAPSULE    Take 75 mg by mouth 2 (two) times daily.    SENNA-DOCUSATE (SENOKOT-S) 8.6-50 MG TABLET    Take 1 tablet by mouth 2 (two) times daily.   SIMVASTATIN (ZOCOR) 10 MG TABLET    Take 10 mg by mouth daily at 6 PM.    TIOTROPIUM (SPIRIVA) 18 MCG INHALATION CAPSULE    Place 1 capsule (18 mcg total) into inhaler and inhale daily.   VENTOLIN HFA 108 (90 BASE) MCG/ACT INHALER    INHALE 2 PUFFS INTO THE LUNGS EVERY 4 HOURS AS NEEDED FOR WHEEZING OR SHORTNESS OF BREATH  Modified Medications   No medications on file  Discontinued Medications     SIGNIFICANT DIAGNOSTIC EXAMS  10-24-15: chest x-ray: 1. New bilateral pleural effusions and posterior lower lobe airspace disease. While this may represent atelectasis, infection is not excluded. 2. Emphysema. 3. Atherosclerosis of the thoracic aorta.  10-26-15: chest x-ray: Lungs hypoexpanded. Vascular congestion noted. Bibasilar airspace opacities likely reflect pulmonary edema, significantly worsened from the prior study, though pneumonia could have a similar appearance.  10-26-15: kub: . No significant change since yesterday in the partial small bowel obstruction. 2. Developing pneumonia in both lung bases.   10-26-15: TEE: Left ventricle: The cavity size was normal. Systolic function was mildly to moderately reduced. The estimated ejection fraction was in the range of 40% to 45%. There is akinesis of the mid-apicalanteroseptal and apical myocardium. Doppler parameters are consistent with abnormal left ventricular relaxation (grade 1 diastolic dysfunction). - Aortic valve: Trileaflet;  mildly thickened, mildly calcified leaflets.  10-27-15: right upper extremity doppler: No evidence of deep vein thrombosis involving the right upper extremity and left subclavian vein. - Findings consistent with superficial vein thrombosis involving the right cephalic vein.  10-28-15: chest x-ray: Interval improvement in bilateral airspace disease consistent with clearing edema. Endotracheal tube removed in the interval.  10-29-15: kub: Slight worsening of partial small bowel obstruction versus ileus.  10-29-15: bilateral lower extremity doppler: No evidence of deep vein thrombosis involving the right lower extremity and left lower extremity. - No evidence of Baker&'s cyst on the right or left.   LABS REVIEWED:   10-24-15; wbc 7.4; hgb 7.9; hct 25.5; mcv 61.2; plt 186; glucose 113; bun 18; creat 1.38; k+ 4.5; na++139; liver normal albumin 3.1; flu: neg 10-27-15: wbc 14.3; hgb 8.5; hct 27.6; mcv 60.5; plt 241; glucose 175; bun 34; creat 1.8; k+ 4.4; na++137; albumin 2.6;  phos 3.1  11-02-15: wbc 7.1 hgb 8.5; hct 27.8; mcv 63.2; plt 179; glucose 150; bun 31; creat 1.29; k+ 3.8; na++141; albumin 2.4;  phos 2.3; mag 2.1     Review of Systems  Constitutional: Negative for malaise/fatigue.  Respiratory: Negative for cough and shortness of breath.   Cardiovascular: Negative for chest pain, palpitations and leg swelling.  Gastrointestinal: Negative for heartburn, abdominal pain and constipation.  Musculoskeletal: Negative for myalgias, back pain and joint pain.  Skin: Negative.   Neurological: Negative for dizziness.  Psychiatric/Behavioral: The patient is not nervous/anxious.     Physical Exam  Constitutional: He is oriented  to person, place, and time. No distress.  Eyes: Conjunctivae are normal.  Neck: Neck supple. No JVD present. No thyromegaly present.  Cardiovascular: Normal rate, regular rhythm and intact distal pulses.   Respiratory: Effort normal and breath sounds normal. No  respiratory distress. He has no wheezes.  GI: Soft. Bowel sounds are normal. He exhibits no distension. There is no tenderness.  Musculoskeletal: He exhibits no edema.  Able to move all extremities   Lymphadenopathy:    He has no cervical adenopathy.  Neurological: He is alert and oriented to person, place, and time.  Skin: Skin is warm and dry. He is not diaphoretic.  Psychiatric: He has a normal mood and affect.     ASSESSMENT/ PLAN:  1. Acute on chronic combined systolic and diastolic heart failure: EF is 40-45%; will continue lasix 20 mg daily; coreg 12.5 mg twice daily  will monitor  2. CAD: is without complaint of chest pain present: will continue asa 81 mg daily prn ntg; coreg 12.5 mg twice daily   3. Hypertension: will continue coreg 12.5 mg twice daily; cozaar 25 mg daily   4. Dyslipidemia: will continue zocor 10 mg daily   5. Anemia: hgb is 8.5; will continue iron twice daily   6. COPD: will continue beve psi aero sphere 9-4.8 mcg 2 puffs twice daly; spiriva 18 mcg daily ; dulera 200-5 2 puffs twice daily and albuterol 2 puffs every 4 hours as needed is on 02  7. UI: will continue myrbetriq 50 mg daily   8. Gerd: will continue prilosec 40 mg daily and reglan 5 mg prior to meals   9. Peripheral neuropathy: will continue lyrica 75 mg twice daily   10. Constipation: is status post ileus: will continue linzes 145 mcg daly miralax twice daily; senna s twice daily   11. Sleep apnea: will continue cpap at night.   12. Right upper extremity superficial vein thrombosis involving the right cephalic vein: I have spoken with Dr. Eulas Post; at this time will repeat right upper extremity venous doppler and will start him on lovenox 40 mg daily for anticoagulation and will monitor his status.    Time spent with patient 50    minutes >50% time spent counseling; reviewing medical record; tests; labs; and developing future plan of care    Ok Edwards NP [facility]    Contact [phone] Monday through Friday 8am- 5pm  After hours call [phone]

## 2015-11-09 ENCOUNTER — Non-Acute Institutional Stay (SKILLED_NURSING_FACILITY): Payer: Medicare Other | Admitting: Adult Health

## 2015-11-09 ENCOUNTER — Encounter: Payer: Self-pay | Admitting: Adult Health

## 2015-11-09 ENCOUNTER — Inpatient Hospital Stay (HOSPITAL_COMMUNITY)
Admission: EM | Admit: 2015-11-09 | Discharge: 2015-11-15 | DRG: 177 | Disposition: A | Discharge status: Skilled Nursing Facility | Payer: Medicare Other | Attending: Internal Medicine | Admitting: Internal Medicine

## 2015-11-09 ENCOUNTER — Emergency Department (HOSPITAL_COMMUNITY): Payer: Medicare Other

## 2015-11-09 ENCOUNTER — Encounter (HOSPITAL_COMMUNITY): Payer: Self-pay

## 2015-11-09 DIAGNOSIS — K219 Gastro-esophageal reflux disease without esophagitis: Secondary | ICD-10-CM | POA: Diagnosis present

## 2015-11-09 DIAGNOSIS — Z8249 Family history of ischemic heart disease and other diseases of the circulatory system: Secondary | ICD-10-CM

## 2015-11-09 DIAGNOSIS — Z8701 Personal history of pneumonia (recurrent): Secondary | ICD-10-CM | POA: Diagnosis not present

## 2015-11-09 DIAGNOSIS — D509 Iron deficiency anemia, unspecified: Secondary | ICD-10-CM | POA: Diagnosis present

## 2015-11-09 DIAGNOSIS — H9192 Unspecified hearing loss, left ear: Secondary | ICD-10-CM | POA: Diagnosis present

## 2015-11-09 DIAGNOSIS — Z7901 Long term (current) use of anticoagulants: Secondary | ICD-10-CM

## 2015-11-09 DIAGNOSIS — L89152 Pressure ulcer of sacral region, stage 2: Secondary | ICD-10-CM | POA: Diagnosis present

## 2015-11-09 DIAGNOSIS — I130 Hypertensive heart and chronic kidney disease with heart failure and stage 1 through stage 4 chronic kidney disease, or unspecified chronic kidney disease: Secondary | ICD-10-CM | POA: Diagnosis present

## 2015-11-09 DIAGNOSIS — J449 Chronic obstructive pulmonary disease, unspecified: Secondary | ICD-10-CM

## 2015-11-09 DIAGNOSIS — L89621 Pressure ulcer of left heel, stage 1: Secondary | ICD-10-CM | POA: Diagnosis present

## 2015-11-09 DIAGNOSIS — Z880 Allergy status to penicillin: Secondary | ICD-10-CM | POA: Diagnosis not present

## 2015-11-09 DIAGNOSIS — J189 Pneumonia, unspecified organism: Secondary | ICD-10-CM | POA: Diagnosis not present

## 2015-11-09 DIAGNOSIS — Z833 Family history of diabetes mellitus: Secondary | ICD-10-CM | POA: Diagnosis not present

## 2015-11-09 DIAGNOSIS — E7800 Pure hypercholesterolemia, unspecified: Secondary | ICD-10-CM | POA: Diagnosis present

## 2015-11-09 DIAGNOSIS — T380X5A Adverse effect of glucocorticoids and synthetic analogues, initial encounter: Secondary | ICD-10-CM | POA: Diagnosis present

## 2015-11-09 DIAGNOSIS — Z87891 Personal history of nicotine dependence: Secondary | ICD-10-CM

## 2015-11-09 DIAGNOSIS — D508 Other iron deficiency anemias: Secondary | ICD-10-CM | POA: Diagnosis present

## 2015-11-09 DIAGNOSIS — R739 Hyperglycemia, unspecified: Secondary | ICD-10-CM | POA: Diagnosis present

## 2015-11-09 DIAGNOSIS — N183 Chronic kidney disease, stage 3 (moderate): Secondary | ICD-10-CM | POA: Diagnosis present

## 2015-11-09 DIAGNOSIS — Z7952 Long term (current) use of systemic steroids: Secondary | ICD-10-CM

## 2015-11-09 DIAGNOSIS — L8990 Pressure ulcer of unspecified site, unspecified stage: Secondary | ICD-10-CM | POA: Diagnosis present

## 2015-11-09 DIAGNOSIS — J438 Other emphysema: Secondary | ICD-10-CM | POA: Diagnosis not present

## 2015-11-09 DIAGNOSIS — Z79899 Other long term (current) drug therapy: Secondary | ICD-10-CM

## 2015-11-09 DIAGNOSIS — G4730 Sleep apnea, unspecified: Secondary | ICD-10-CM | POA: Diagnosis present

## 2015-11-09 DIAGNOSIS — Z823 Family history of stroke: Secondary | ICD-10-CM | POA: Diagnosis not present

## 2015-11-09 DIAGNOSIS — D638 Anemia in other chronic diseases classified elsewhere: Secondary | ICD-10-CM | POA: Diagnosis present

## 2015-11-09 DIAGNOSIS — R7881 Bacteremia: Secondary | ICD-10-CM | POA: Diagnosis present

## 2015-11-09 DIAGNOSIS — I5043 Acute on chronic combined systolic (congestive) and diastolic (congestive) heart failure: Secondary | ICD-10-CM | POA: Diagnosis present

## 2015-11-09 DIAGNOSIS — N390 Urinary tract infection, site not specified: Secondary | ICD-10-CM | POA: Diagnosis present

## 2015-11-09 DIAGNOSIS — Z885 Allergy status to narcotic agent status: Secondary | ICD-10-CM

## 2015-11-09 DIAGNOSIS — I255 Ischemic cardiomyopathy: Secondary | ICD-10-CM | POA: Diagnosis present

## 2015-11-09 DIAGNOSIS — J440 Chronic obstructive pulmonary disease with acute lower respiratory infection: Secondary | ICD-10-CM | POA: Diagnosis present

## 2015-11-09 DIAGNOSIS — Z8546 Personal history of malignant neoplasm of prostate: Secondary | ICD-10-CM

## 2015-11-09 DIAGNOSIS — I252 Old myocardial infarction: Secondary | ICD-10-CM | POA: Diagnosis not present

## 2015-11-09 DIAGNOSIS — D569 Thalassemia, unspecified: Secondary | ICD-10-CM | POA: Diagnosis present

## 2015-11-09 DIAGNOSIS — Z888 Allergy status to other drugs, medicaments and biological substances status: Secondary | ICD-10-CM | POA: Diagnosis not present

## 2015-11-09 DIAGNOSIS — Z8673 Personal history of transient ischemic attack (TIA), and cerebral infarction without residual deficits: Secondary | ICD-10-CM | POA: Diagnosis not present

## 2015-11-09 DIAGNOSIS — G608 Other hereditary and idiopathic neuropathies: Secondary | ICD-10-CM | POA: Diagnosis present

## 2015-11-09 DIAGNOSIS — Z882 Allergy status to sulfonamides status: Secondary | ICD-10-CM

## 2015-11-09 DIAGNOSIS — J9600 Acute respiratory failure, unspecified whether with hypoxia or hypercapnia: Secondary | ICD-10-CM

## 2015-11-09 DIAGNOSIS — J441 Chronic obstructive pulmonary disease with (acute) exacerbation: Secondary | ICD-10-CM | POA: Diagnosis present

## 2015-11-09 DIAGNOSIS — Z7982 Long term (current) use of aspirin: Secondary | ICD-10-CM | POA: Diagnosis not present

## 2015-11-09 DIAGNOSIS — Y95 Nosocomial condition: Secondary | ICD-10-CM | POA: Diagnosis present

## 2015-11-09 DIAGNOSIS — Z9049 Acquired absence of other specified parts of digestive tract: Secondary | ICD-10-CM | POA: Diagnosis not present

## 2015-11-09 DIAGNOSIS — R0602 Shortness of breath: Secondary | ICD-10-CM | POA: Diagnosis not present

## 2015-11-09 DIAGNOSIS — I10 Essential (primary) hypertension: Secondary | ICD-10-CM | POA: Diagnosis not present

## 2015-11-09 DIAGNOSIS — Z881 Allergy status to other antibiotic agents status: Secondary | ICD-10-CM

## 2015-11-09 DIAGNOSIS — I2510 Atherosclerotic heart disease of native coronary artery without angina pectoris: Secondary | ICD-10-CM | POA: Diagnosis present

## 2015-11-09 DIAGNOSIS — J155 Pneumonia due to Escherichia coli: Principal | ICD-10-CM | POA: Diagnosis present

## 2015-11-09 DIAGNOSIS — Z7951 Long term (current) use of inhaled steroids: Secondary | ICD-10-CM | POA: Diagnosis not present

## 2015-11-09 DIAGNOSIS — L89611 Pressure ulcer of right heel, stage 1: Secondary | ICD-10-CM | POA: Diagnosis present

## 2015-11-09 DIAGNOSIS — Z808 Family history of malignant neoplasm of other organs or systems: Secondary | ICD-10-CM

## 2015-11-09 LAB — COMPREHENSIVE METABOLIC PANEL
ALBUMIN: 2.6 g/dL — AB (ref 3.5–5.0)
ALT: 16 U/L — AB (ref 17–63)
AST: 14 U/L — AB (ref 15–41)
Alkaline Phosphatase: 60 U/L (ref 38–126)
Anion gap: 9 (ref 5–15)
BUN: 32 mg/dL — AB (ref 6–20)
CALCIUM: 8.4 mg/dL — AB (ref 8.9–10.3)
CO2: 28 mmol/L (ref 22–32)
Chloride: 104 mmol/L (ref 101–111)
Creatinine, Ser: 1.29 mg/dL — ABNORMAL HIGH (ref 0.61–1.24)
GFR calc Af Amer: 55 mL/min — ABNORMAL LOW (ref >60)
GFR, EST NON AFRICAN AMERICAN: 47 mL/min — AB (ref >60)
GLUCOSE: 293 mg/dL — AB (ref 65–99)
Potassium: 3.9 mmol/L (ref 3.5–5.1)
Sodium: 141 mmol/L (ref 135–145)
TOTAL PROTEIN: 6 g/dL — AB (ref 6.5–8.1)
Total Bilirubin: 0.4 mg/dL (ref 0.3–1.2)

## 2015-11-09 LAB — CBC WITH DIFFERENTIAL/PLATELET
BASOS ABS: 0 10*3/uL (ref 0.0–0.1)
Basophils Relative: 0 %
EOS ABS: 0 10*3/uL (ref 0.0–0.7)
Eosinophils Relative: 0 %
HCT: 27.7 % — ABNORMAL LOW (ref 39.0–52.0)
HEMOGLOBIN: 8.5 g/dL — AB (ref 13.0–17.0)
LYMPHS ABS: 0.3 10*3/uL — AB (ref 0.7–4.0)
Lymphocytes Relative: 4 %
MCH: 18.9 pg — AB (ref 26.0–34.0)
MCHC: 30.7 g/dL (ref 30.0–36.0)
MCV: 61.7 fL — AB (ref 78.0–100.0)
Monocytes Absolute: 0.3 10*3/uL (ref 0.1–1.0)
Monocytes Relative: 3 %
NEUTROS ABS: 8 10*3/uL — AB (ref 1.7–7.7)
Neutrophils Relative %: 93 %
Platelets: 110 10*3/uL — ABNORMAL LOW (ref 150–400)
RBC: 4.49 MIL/uL (ref 4.22–5.81)
RDW: 16.7 % — ABNORMAL HIGH (ref 11.5–15.5)
WBC: 8.6 10*3/uL (ref 4.0–10.5)

## 2015-11-09 LAB — BRAIN NATRIURETIC PEPTIDE: B Natriuretic Peptide: 423.8 pg/mL — ABNORMAL HIGH (ref 0.0–100.0)

## 2015-11-09 LAB — TROPONIN I: TROPONIN I: 0.03 ng/mL (ref <0.031)

## 2015-11-09 LAB — LACTIC ACID, PLASMA: Lactic Acid, Venous: 2.4 mmol/L (ref 0.5–2.0)

## 2015-11-09 MED ORDER — PREGABALIN 75 MG PO CAPS
75.0000 mg | ORAL_CAPSULE | Freq: Two times a day (BID) | ORAL | Status: DC
  Administered 2015-11-10 – 2015-11-15 (×12): 75 mg via ORAL
  Filled 2015-11-09 (×12): qty 1

## 2015-11-09 MED ORDER — ALBUTEROL SULFATE (2.5 MG/3ML) 0.083% IN NEBU
5.0000 mg | INHALATION_SOLUTION | Freq: Once | RESPIRATORY_TRACT | Status: AC
Start: 2015-11-09 — End: 2015-11-09
  Administered 2015-11-09: 5 mg via RESPIRATORY_TRACT
  Filled 2015-11-09: qty 6

## 2015-11-09 MED ORDER — ENOXAPARIN SODIUM 40 MG/0.4ML ~~LOC~~ SOLN
40.0000 mg | Freq: Every day | SUBCUTANEOUS | Status: DC
  Administered 2015-11-10 – 2015-11-14 (×6): 40 mg via SUBCUTANEOUS
  Filled 2015-11-09 (×7): qty 0.4

## 2015-11-09 MED ORDER — POLYETHYLENE GLYCOL 3350 17 G PO PACK
17.0000 g | PACK | Freq: Two times a day (BID) | ORAL | Status: DC
  Administered 2015-11-10 – 2015-11-15 (×7): 17 g via ORAL
  Filled 2015-11-09 (×14): qty 1

## 2015-11-09 MED ORDER — SODIUM CHLORIDE 0.9% FLUSH
3.0000 mL | Freq: Two times a day (BID) | INTRAVENOUS | Status: DC
  Administered 2015-11-10 – 2015-11-15 (×11): 3 mL via INTRAVENOUS

## 2015-11-09 MED ORDER — ASPIRIN EC 81 MG PO TBEC
81.0000 mg | DELAYED_RELEASE_TABLET | Freq: Every day | ORAL | Status: DC
  Administered 2015-11-10 – 2015-11-15 (×6): 81 mg via ORAL
  Filled 2015-11-09 (×6): qty 1

## 2015-11-09 MED ORDER — PANTOPRAZOLE SODIUM 40 MG PO TBEC
40.0000 mg | DELAYED_RELEASE_TABLET | Freq: Every day | ORAL | Status: DC
  Administered 2015-11-10 – 2015-11-15 (×6): 40 mg via ORAL
  Filled 2015-11-09 (×6): qty 1

## 2015-11-09 MED ORDER — ONDANSETRON HCL 4 MG/2ML IJ SOLN: 4.0000 mg | Freq: Four times a day (QID) | INTRAMUSCULAR | Status: DC | PRN

## 2015-11-09 MED ORDER — INSULIN ASPART 100 UNIT/ML ~~LOC~~ SOLN
0.0000 [IU] | SUBCUTANEOUS | Status: DC
  Administered 2015-11-10: 3 [IU] via SUBCUTANEOUS
  Administered 2015-11-10: 5 [IU] via SUBCUTANEOUS
  Administered 2015-11-10 – 2015-11-11 (×3): 3 [IU] via SUBCUTANEOUS
  Administered 2015-11-11 (×2): 1 [IU] via SUBCUTANEOUS
  Administered 2015-11-11: 2 [IU] via SUBCUTANEOUS
  Administered 2015-11-11: 3 [IU] via SUBCUTANEOUS
  Administered 2015-11-11: 2 [IU] via SUBCUTANEOUS
  Administered 2015-11-11: 3 [IU] via SUBCUTANEOUS
  Administered 2015-11-12: 5 [IU] via SUBCUTANEOUS
  Administered 2015-11-12 (×2): 2 [IU] via SUBCUTANEOUS
  Administered 2015-11-12: 3 [IU] via SUBCUTANEOUS
  Administered 2015-11-13 (×3): 2 [IU] via SUBCUTANEOUS
  Administered 2015-11-13: 3 [IU] via SUBCUTANEOUS
  Administered 2015-11-13: 2 [IU] via SUBCUTANEOUS
  Administered 2015-11-14: 3 [IU] via SUBCUTANEOUS
  Administered 2015-11-14: 1 [IU] via SUBCUTANEOUS
  Administered 2015-11-14: 3 [IU] via SUBCUTANEOUS
  Administered 2015-11-14 (×2): 1 [IU] via SUBCUTANEOUS

## 2015-11-09 MED ORDER — SODIUM CHLORIDE 0.9 % IV BOLUS (SEPSIS)
500.0000 mL | Freq: Once | INTRAVENOUS | Status: AC
  Administered 2015-11-09: 500 mL via INTRAVENOUS

## 2015-11-09 MED ORDER — DEXTROSE 5 % IV SOLN
2.0000 g | Freq: Once | INTRAVENOUS | Status: AC
  Administered 2015-11-09: 2 g via INTRAVENOUS
  Filled 2015-11-09: qty 2

## 2015-11-09 MED ORDER — VANCOMYCIN HCL IN DEXTROSE 1-5 GM/200ML-% IV SOLN
1000.0000 mg | INTRAVENOUS | Status: DC
  Administered 2015-11-09 – 2015-11-11 (×3): 1000 mg via INTRAVENOUS
  Filled 2015-11-09 (×3): qty 200

## 2015-11-09 MED ORDER — LOSARTAN POTASSIUM 25 MG PO TABS
25.0000 mg | ORAL_TABLET | Freq: Every day | ORAL | Status: DC
  Administered 2015-11-10 – 2015-11-13 (×4): 25 mg via ORAL
  Filled 2015-11-09 (×4): qty 1

## 2015-11-09 MED ORDER — LINACLOTIDE 145 MCG PO CAPS
145.0000 ug | ORAL_CAPSULE | Freq: Every day | ORAL | Status: DC
  Administered 2015-11-10 – 2015-11-15 (×6): 145 ug via ORAL
  Filled 2015-11-09 (×8): qty 1

## 2015-11-09 MED ORDER — ARFORMOTEROL TARTRATE 15 MCG/2ML IN NEBU
15.0000 ug | INHALATION_SOLUTION | Freq: Two times a day (BID) | RESPIRATORY_TRACT | Status: DC
  Administered 2015-11-10 – 2015-11-13 (×8): 15 ug via RESPIRATORY_TRACT
  Filled 2015-11-09 (×14): qty 2

## 2015-11-09 MED ORDER — BENZONATATE 100 MG PO CAPS
200.0000 mg | ORAL_CAPSULE | Freq: Two times a day (BID) | ORAL | Status: DC | PRN
  Administered 2015-11-10 – 2015-11-12 (×2): 200 mg via ORAL
  Filled 2015-11-09 (×2): qty 2

## 2015-11-09 MED ORDER — SODIUM CHLORIDE 0.9 % IV SOLN: INTRAVENOUS | Status: DC

## 2015-11-09 MED ORDER — SODIUM CHLORIDE 0.9 % IV SOLN
INTRAVENOUS | Status: DC
  Administered 2015-11-09: 22:00:00 via INTRAVENOUS

## 2015-11-09 MED ORDER — METOCLOPRAMIDE HCL 5 MG PO TABS
5.0000 mg | ORAL_TABLET | Freq: Three times a day (TID) | ORAL | Status: DC
  Administered 2015-11-10 – 2015-11-15 (×17): 5 mg via ORAL
  Filled 2015-11-09 (×21): qty 1

## 2015-11-09 MED ORDER — CARVEDILOL 12.5 MG PO TABS
12.5000 mg | ORAL_TABLET | Freq: Two times a day (BID) | ORAL | Status: DC
  Administered 2015-11-10 – 2015-11-13 (×7): 12.5 mg via ORAL
  Filled 2015-11-09 (×9): qty 1

## 2015-11-09 MED ORDER — ALBUTEROL SULFATE (2.5 MG/3ML) 0.083% IN NEBU
2.5000 mg | INHALATION_SOLUTION | RESPIRATORY_TRACT | Status: DC | PRN
  Administered 2015-11-10: 2.5 mg via RESPIRATORY_TRACT
  Filled 2015-11-09: qty 3

## 2015-11-09 MED ORDER — ALBUTEROL SULFATE (2.5 MG/3ML) 0.083% IN NEBU
2.5000 mg | INHALATION_SOLUTION | RESPIRATORY_TRACT | Status: DC
  Administered 2015-11-10: 2.5 mg via RESPIRATORY_TRACT
  Filled 2015-11-09: qty 3

## 2015-11-09 MED ORDER — SODIUM CHLORIDE 0.9% FLUSH: 3.0000 mL | INTRAVENOUS | Status: DC | PRN

## 2015-11-09 MED ORDER — SENNOSIDES-DOCUSATE SODIUM 8.6-50 MG PO TABS
1.0000 | ORAL_TABLET | Freq: Two times a day (BID) | ORAL | Status: DC
  Administered 2015-11-10 – 2015-11-15 (×9): 1 via ORAL
  Filled 2015-11-09 (×14): qty 1

## 2015-11-09 MED ORDER — FUROSEMIDE 10 MG/ML IJ SOLN
40.0000 mg | Freq: Every day | INTRAMUSCULAR | Status: DC
  Administered 2015-11-10 – 2015-11-12 (×4): 40 mg via INTRAVENOUS
  Filled 2015-11-09 (×6): qty 4

## 2015-11-09 MED ORDER — SIMVASTATIN 10 MG PO TABS
10.0000 mg | ORAL_TABLET | Freq: Every day | ORAL | Status: DC
  Administered 2015-11-10 – 2015-11-14 (×5): 10 mg via ORAL
  Filled 2015-11-09 (×6): qty 1

## 2015-11-09 MED ORDER — BUDESONIDE 0.5 MG/2ML IN SUSP
0.5000 mg | Freq: Two times a day (BID) | RESPIRATORY_TRACT | Status: DC
  Administered 2015-11-10 – 2015-11-14 (×9): 0.5 mg via RESPIRATORY_TRACT
  Filled 2015-11-09 (×10): qty 2

## 2015-11-09 MED ORDER — SODIUM CHLORIDE 0.9 % IV SOLN: 250.0000 mL | INTRAVENOUS | Status: DC | PRN

## 2015-11-09 MED ORDER — FERROUS SULFATE 325 (65 FE) MG PO TABS
325.0000 mg | ORAL_TABLET | Freq: Two times a day (BID) | ORAL | Status: DC
  Administered 2015-11-10 – 2015-11-15 (×11): 325 mg via ORAL
  Filled 2015-11-09 (×13): qty 1

## 2015-11-09 MED ORDER — TIOTROPIUM BROMIDE MONOHYDRATE 18 MCG IN CAPS
18.0000 ug | ORAL_CAPSULE | Freq: Every day | RESPIRATORY_TRACT | Status: DC
  Administered 2015-11-10 – 2015-11-14 (×5): 18 ug via RESPIRATORY_TRACT
  Filled 2015-11-09 (×2): qty 5

## 2015-11-09 MED ORDER — GUAIFENESIN ER 600 MG PO TB12
600.0000 mg | ORAL_TABLET | Freq: Two times a day (BID) | ORAL | Status: DC
  Administered 2015-11-10 – 2015-11-15 (×12): 600 mg via ORAL
  Filled 2015-11-09 (×15): qty 1

## 2015-11-09 MED ORDER — NITROGLYCERIN 0.4 MG SL SUBL: 0.4000 mg | SUBLINGUAL_TABLET | SUBLINGUAL | Status: DC | PRN

## 2015-11-09 MED ORDER — ACETAMINOPHEN 325 MG PO TABS: 650.0000 mg | ORAL_TABLET | Freq: Four times a day (QID) | ORAL | Status: DC | PRN

## 2015-11-09 NOTE — H&P (Signed)
[facility] History and Physical  [name] [id number] DOB: 15-Oct-1923 DOA: 11/09/2015  Referring physician: Dr. Thurnell Garbe PCP:  Melinda Crutch, MD   Chief Complaint: Cough and shortness of breath  HPI:  [name] is a 82 year old male with past medical history significant for COPD, CKD stage III, HTN, ischemic cardiomyopathy, systolic CHF last EF [id number] in 10/2015; who presents with complaints of all week history of cough and shortness of breath. He was just admitted to the hospital from 10/14/2015 through 10/23/2015 where he suffered from acute respiratory failure having to be intubated diagnosed at that time with healthcare associated pneumonia with COPD and CHF exacerbation. He was discharged and sent to [facility] rehabilitation. He reported having a productive cough of thick yellowish greenish sputum that changed to more of a grayish color. Sputum production has become more increased over the last few days.. Patient denies any fever, but overall was feeling worse. Associated symptoms include increased lower leg swelling. He was evaluated at the rehabilitation facility today and chest x-ray was obtained which showed signs of the bilateral pneumonia. It was recommended to treat him as an outpatient but the patient did not feel comfortable and came to the ED for further evaluation.  Upon admission into the emergency department patient has checked with a chest x-ray which showed patchy bilateral infiltrates suggestive of a multifocal pneumonia. Initial WBC 8.6, hemoglobin 8.5, platelet 110, creatinine 1.29, BUN 32, glucose 293, BNP 423.8, and lactate 2.4. Patient able to maintain O2 sats on room air  Review of Systems  Constitutional: Positive for malaise/fatigue. Negative for fever and chills.  HENT: Positive for hearing loss. Negative for ear discharge and ear pain.   Eyes: Negative for double vision and photophobia.  Respiratory: Positive for cough, sputum production, shortness of breath and  wheezing. Negative for hemoptysis.   Cardiovascular: Positive for leg swelling. Negative for chest pain.  Gastrointestinal: Negative for nausea, vomiting and abdominal pain.  Genitourinary: Negative for frequency and hematuria.  Musculoskeletal: Positive for joint pain. Negative for falls.  Skin:       Positive for pressure ulcer  Neurological: Negative for tremors and sensory change.  Endo/Heme/Allergies: Negative for environmental allergies and polydipsia.  Psychiatric/Behavioral: Negative for substance abuse. The patient is not nervous/anxious.     Past Medical History  Diagnosis Date  . Carotid artery stenosis   . Essential hypertension   . Myocardial infarction ([place]) 1979  . Coronary artery disease     Known occluded proximal LAD with collaterals, moderate RCA disease - managed medically 2009  . COPD (chronic obstructive pulmonary disease) ([place])   . History of stroke   . Prostate cancer ([place])   . Arthritis   . Foot drop, right   . Neuropathy ([place])   . Anemia   . Urinary incontinence, male, stress   . GERD (gastroesophageal reflux disease)   . Thalassemia   . Wears dentures   . Deafness in left ear     Wears hearing aide in right ear  . Sleep apnea     CPAP  . Ischemic cardiomyopathy     LVEF 40-45%  . Hypercholesteremia   . Anemia   . Osteoarthritis   . Stroke ([place])   . Heart attack ([place])   . Vertigo   . Hereditary and idiopathic peripheral neuropathy 03/01/2015  . Abnormality of gait 03/01/2015     Past Surgical History  Procedure Laterality Date  . Penile prosthesis implant    . Cholecystectomy    .  Right carotid endarterectomy    . Back surgery    . Lumbar laminectomy  03/24/10    L3-4 and L4-5  . Ankle surgery      Bone infection  . Cardiac catheterization    . Appendectomy    . Joint replacement Left   . Radioactive seed implant  2009  . Pars plana vitrectomy  03/19/2012    Procedure: PARS PLANA VITRECTOMY WITH 25 GAUGE;  Surgeon: Hayden Pedro,  MD;  Location: [facility];  Service: Ophthalmology;  Laterality: Right;  . Carotid endarterectomy Right     CEA - 15 yrs ago  . Spine surgery    . Eye surgery    . Cataract extraction w/ intraocular lens implant Left   . Endarterectomy Left 09/28/2014    Procedure: Left Carotid Endarterectomy;  Surgeon: Rosetta Posner, MD;  Location: [place];  Service: Vascular;  Laterality: Left;  . Patch angioplasty Left 09/28/2014    Procedure: WITH DACRON PATCH ANGIOPLASTY;  Surgeon: Rosetta Posner, MD;  Location: [facility];  Service: Vascular;  Laterality: Left;      Social History:  reports that he quit smoking about 38 years ago. His smoking use included Cigarettes. He has a 20 pack-year smoking history. He has never used smokeless tobacco. He reports that he does not drink alcohol or use illicit drugs. Where does patient live-- SNF Can patient participate in ADLs? Needs assistance  Allergies  Allergen Reactions  . Clopidogrel Bisulfate Hives    REACTION: red blue, black blotches  . Dilaudid [Hydromorphone Hcl] Hives    Hallucinations, little green men working on the building  . Penicillins Hives    Has patient had a PCN reaction causing immediate rash, facial/tongue/throat swelling, SOB or lightheadedness with hypotension: Yes Has patient had a PCN reaction causing severe rash involving mucus membranes or skin necrosis: No Has patient had a PCN reaction that required hospitalization No Has patient had a PCN reaction occurring within the last 10 years: No If all of the above answers are "NO", then may proceed with Cephalosporin use.   . Sulfa Antibiotics Other (See Comments)    Nightmares, hallucinations  . Sulfonamide Derivatives Other (See Comments)    Nightmares, hallucinations  . Doxycycline Hyclate Rash    Family History  Problem Relation Age of Onset  . Thalassemia      Family history  . Cancer Mother 13    Bone cancer  . Diabetes Mother   . Heart disease Mother   . Heart attack Mother   .  Hypertension Mother   . Stroke Father 64  . Heart disease Father   . Hypertension Father   . Hypertension Sister   . Diabetes Sister   . Heart disease Brother   . Heart attack Brother 17  . Cancer Brother         Prior to Admission medications   Medication Sig Start Date End Date Taking? Authorizing Provider  aspirin EC 81 MG tablet Take 81 mg by mouth daily.   Yes Historical Provider, MD  carvedilol (COREG) 12.5 MG tablet TAKE 1 TABLET TWICE DAILY WITH A MEAL 06/08/15  Yes Dorothy Spark, MD  enoxaparin (LOVENOX) 40 MG/0.4ML injection Inject 40 mg into the skin daily.   Yes Historical Provider, MD  ferrous sulfate 325 (65 FE) MG tablet Take 1 tablet (325 mg total) by mouth 2 (two) times daily with a meal. 11/02/15  Yes Lavina Hamman, MD  furosemide (LASIX) 20 MG tablet  Take 1 tablet (20 mg total) by mouth daily. 11/02/15  Yes Lavina Hamman, MD  Glycopyrrolate-Formoterol (BEVESPI AEROSPHERE) 9-4.8 MCG/ACT AERO Inhale 2 puffs into the lungs every 12 (twelve) hours. 07/19/15  Yes Tanda Rockers, MD  guaifenesin (ROBITUSSIN) 100 MG/5ML syrup Take 10 mg by mouth every 4 (four) hours as needed for cough.   Yes Historical Provider, MD  ipratropium-albuterol (DUONEB) 0.5-2.5 (3) MG/3ML SOLN Take 3 mLs by nebulization every 6 (six) hours as needed.   Yes Historical Provider, MD  LINZESS 145 MCG CAPS capsule Take 1 capsule by mouth daily with breakfast. 06/29/15  Yes Historical Provider, MD  losartan (COZAAR) 25 MG tablet Take 1 tablet (25 mg total) by mouth daily. 01/14/15  Yes Marijean Heath, NP  metoCLOPramide (REGLAN) 5 MG tablet Take 1 tablet (5 mg total) by mouth 3 (three) times daily before meals. 11/02/15  Yes Lavina Hamman, MD  mometasone-formoterol (DULERA) 200-5 MCG/ACT AERO Inhale 2 puffs into the lungs 2 (two) times daily. 10/01/15  Yes Tanda Rockers, MD  MYRBETRIQ 50 MG TB24 tablet Take 50 mg by mouth daily. 06/29/15  Yes Historical Provider, MD  omeprazole (PRILOSEC) 20 MG  capsule Take 40 mg by mouth daily.   Yes Historical Provider, MD  polyethylene glycol (MIRALAX / GLYCOLAX) packet Take 17 g by mouth 2 (two) times daily. 11/02/15  Yes Lavina Hamman, MD  predniSONE (DELTASONE) 10 MG tablet Take 10-20 mg by mouth daily with breakfast. Take 2 tablets for 3 days and Take 1 tablet for 3 days.   Yes Historical Provider, MD  pregabalin (LYRICA) 75 MG capsule Take 75 mg by mouth 2 (two) times daily.    Yes Historical Provider, MD  senna-docusate (SENOKOT-S) 8.6-50 MG tablet Take 1 tablet by mouth 2 (two) times daily. 11/02/15  Yes Lavina Hamman, MD  simvastatin (ZOCOR) 10 MG tablet Take 10 mg by mouth daily at 6 PM.  10/11/15  Yes Historical Provider, MD  tiotropium (SPIRIVA) 18 MCG inhalation capsule Place 1 capsule (18 mcg total) into inhaler and inhale daily. 09/23/15  Yes Maryann Mikhail, DO  VENTOLIN HFA 108 (90 BASE) MCG/ACT inhaler INHALE 2 PUFFS INTO THE LUNGS EVERY 4 HOURS AS NEEDED FOR WHEEZING OR SHORTNESS OF BREATH 07/12/15  Yes Tanda Rockers, MD  acetaminophen (TYLENOL) 325 MG tablet Take 650 mg by mouth every 6 (six) hours as needed for mild pain.     Historical Provider, MD  meclizine (ANTIVERT) 25 MG tablet Take 1 tablet (25 mg total) by mouth daily as needed for dizziness. 01/29/15   Dorothy Spark, MD  NITROSTAT 0.4 MG SL tablet PLACE 1 TABLET (0.4 MG TOTAL) UNDER THE TONGUE EVERY 5 (FIVE) MINUTES AS NEEDED. FOR CHEST PAIN 04/08/15   Dorothy Spark, MD  omeprazole (PRILOSEC) 40 MG capsule Take 1 capsule (40 mg total) by mouth daily. Patient not taking: Reported on 11/09/2015 05/21/15   Dorothy Spark, MD     Physical Exam: Filed Vitals:   11/09/15 1919 11/09/15 1951 11/09/15 2130 11/09/15 2200  BP: 133/53 141/54 129/59 133/54  Pulse: 76 72 74 72  Temp: 98.4 F (36.9 C)  98.1 F (36.7 C)   TempSrc: Oral  Oral   Resp: 18 20 18 16   SpO2: 95% 100% 94% 94%     Constitutional: Vital signs reviewed. Patient is a elderly male who appears acutely  ill, but not toxic. Able to follow commands and talks in full sentences. Head: Normocephalic  and atraumatic  Ear: TM normal bilaterally, hard of hearing Mouth: no erythema or exudates, MMM  Eyes: PERRL, EOMI, conjunctivae normal, No scleral icterus.  Neck: Supple, Trachea midline normal ROM, No JVD, mass, thyromegaly, or carotid bruit present.  Cardiovascular: RRR, S1 normal, S2 normal, no MRG, pulses symmetric and intact bilaterally  Pulmonary/Chest:  Bilateral wheezes appreciated with intermittent rhonchi. Patient producing grayish green sputum. Abdominal: Soft. Non-tender, non-distended, bowel sounds are normal, no masses, organomegaly, or guarding present.  GU: no CVA tenderness Musculoskeletal: No joint deformities, erythema, or stiffness, ROM full and no nontender Ext: +2 pitting edema bilaterally to the mid shin. and no cyanosis, pulses palpable bilaterally (DP and PT)  Hematology: no cervical, inginal, or axillary adenopathy.  Neurological: A&O x3, Strenght is normal and symmetric bilaterally, cranial nerve II-XII are grossly intact, no focal motor deficit, sensory intact to light touch bilaterally.  Skin: Warm, dry and presence of pressure ulcer No rash, cyanosis, or clubbing.  Psychiatric: Normal mood and affect. speech and behavior is normal. Judgment and thought content normal. Cognition and memory are normal.      Data Review   Micro Results No results found for this or any previous visit (from the past 240 hour(s)).  Radiology Reports Ct Abdomen Pelvis Wo Contrast  10/15/2015  CLINICAL DATA:  Acute onset of generalized abdominal pain and diarrhea. Initial encounter. EXAM: CT ABDOMEN AND PELVIS WITHOUT CONTRAST TECHNIQUE: Multidetector CT imaging of the abdomen and pelvis was performed following the standard protocol without IV contrast. COMPARISON:  CT of the abdomen and pelvis performed 09/12/2012 FINDINGS: Minimal bibasilar atelectasis or scarring is noted. Diffuse coronary  artery calcifications are seen. There is marked thinning of the myocardium at the ventricular apex, with associated calcification and mild focal bulging, reflecting prior myocardial infarction. Calcification is noted at the aortic and mitral valves. Scattered calcified granulomata are noted within the liver. The liver and spleen are otherwise grossly unremarkable. The patient is status post cholecystectomy, with clips noted along the gallbladder fossa. The pancreas and adrenal glands are grossly unremarkable. A few scattered bilateral renal cysts are seen, measuring up to 3.8 cm in size. There is question of minimal associated calcification at a small right renal cyst. Nonspecific perinephric stranding is noted bilaterally. Minimal right-sided hydronephrosis is nonspecific. No definite distal obstruction is seen. Scattered vascular calcifications are seen at the renal hila. No free fluid is identified. The small bowel is unremarkable in appearance. The stomach is within normal limits. No acute vascular abnormalities are seen. Relatively diffuse calcification is noted along the abdominal aorta and its branches. There is minimal ectasia of the infrarenal abdominal aorta, without evidence of aneurysmal dilatation. The patient is status post appendectomy. The colon is largely filled with fluid, corresponding to the patient's diarrhea. The bladder is mildly distended and grossly unremarkable. A reservoir is noted at the right hemipelvis. Scattered brachytherapy seeds are noted at the prostate bed. No inguinal lymphadenopathy is seen. No acute osseous abnormalities are identified. Multilevel vacuum phenomenon is noted along the lumbar spine. There is minimal grade 1 retrolisthesis of L3 on L4, and minimal grade 1 anterolisthesis of L4 on L5, reflecting underlying facet disease. IMPRESSION: 1. Colon largely filled with fluid, corresponding to the patient's diarrhea. 2. Marked thinning of the myocardium at the ventricular  apex, with associated calcification and mild focal bulging, reflecting prior myocardial infarction. 3. Diffuse coronary artery calcifications seen. Calcification at the aortic and mitral valves. 4. Few scattered bilateral renal cysts noted. Minimal nonspecific right-sided  hydronephrosis, without evidence of distal obstruction. 5. Relatively diffuse calcification along the abdominal aorta and its branches. 6. Mild degenerative change along the lumbar spine. Electronically Signed   By: Garald Balding M.D.   On: 10/15/2015 06:13   Dg Chest 2 View  11/09/2015  CLINICAL DATA:  Shortness of breath. EXAM: CHEST  2 VIEW COMPARISON:  10/28/2015 chest radiograph. FINDINGS: Surgical clips in the medial lower right neck. Stable cardiomediastinal silhouette with normal heart size. No pneumothorax. No pleural effusion. Patchy consolidation throughout both lower lobes. No pulmonary edema. IMPRESSION: Patchy bilateral lower lobe consolidation, most suggestive of multilobar pneumonia. Recommend follow-up PA and lateral post treatment chest radiographs in 4-6 weeks. Electronically Signed   By: Ilona Sorrel M.D.   On: 11/09/2015 20:38   Dg Chest 2 View  10/25/2015  CLINICAL DATA:  Shortness of breath and cough EXAM: CHEST  2 VIEW COMPARISON:  10/24/15 FINDINGS: Cardiac shadow is stable. The lungs are well aerated bilaterally. Small effusions are again noted. No focal confluent infiltrate is seen. IMPRESSION: Small bilateral pleural effusions. Improved aeration in the bases is noted. Electronically Signed   By: Inez Catalina M.D.   On: 10/25/2015 10:07   Dg Chest 2 View  10/24/2015  CLINICAL DATA:  Productive cough for 2-3 days. EXAM: CHEST - 2 VIEW COMPARISON:  Two-view chest x-ray 09/20/2015. FINDINGS: The heart size is normal. Small bilateral pleural effusions are now present. Mild bibasilar airspace opacities are evident posteriorly. The upper lung fields are clear. Emphysematous changes are again noted. Atherosclerotic  calcifications are present in the aortic arch. Surgical clips are present in the right neck and at the gallbladder fossa. IMPRESSION: 1. New bilateral pleural effusions and posterior lower lobe airspace disease. While this may represent atelectasis, infection is not excluded. 2. Emphysema. 3. Atherosclerosis of the thoracic aorta. Electronically Signed   By: San Morelle M.D.   On: 10/24/2015 13:38   Dg Chest Port 1 View  10/28/2015  CLINICAL DATA:  COPD exacerbation.  Short of breath EXAM: PORTABLE CHEST 1 VIEW COMPARISON:  10/26/2015 FINDINGS: Endotracheal tube and NG tubes have been removed. Bilateral airspace disease in the bases left greater than right shows interval improvement. Minimal pleural effusion bilaterally. IMPRESSION: Interval improvement in bilateral airspace disease consistent with clearing edema. Endotracheal tube removed in the interval. Electronically Signed   By: Franchot Gallo M.D.   On: 10/28/2015 07:33   Dg Chest Port 1 View  10/26/2015  CLINICAL DATA:  Intubation EXAM: PORTABLE CHEST 1 VIEW COMPARISON:  Yesterday FINDINGS: New endotracheal tube with tip between the clavicular heads and carina. An orogastric tube reaches stomach at least. Diffuse interstitial opacities are improved. Kerley lines are still noted. There is asymmetric airspace disease to the left. No definitive effusion. When accounting for a skin fold on the left there is no visible pneumothorax. IMPRESSION: 1. New endotracheal and orogastric tubes are in good position. 2. Pulmonary edema with improvement since earlier. 3. Airspace disease which could be asymmetric alveolar edema or superimposed pneumonia. Electronically Signed   By: Monte Fantasia M.D.   On: 10/26/2015 02:00   Dg Chest Port 1 View  10/26/2015  CLINICAL DATA:  Acute onset of shortness of breath. Initial encounter. EXAM: PORTABLE CHEST 1 VIEW COMPARISON:  Chest radiograph performed earlier today at 9:24 a.m. FINDINGS: The lungs are  hyperexpanded. The lung bases are incompletely imaged on this study. Vascular congestion is noted. Bibasilar airspace opacities likely reflect pulmonary edema, significantly worsened from the recent prior  study, though pneumonia could have a similar appearance. No definite pleural effusion or pneumothorax is seen. The cardiomediastinal silhouette is within normal limits. No acute osseous abnormalities are seen. IMPRESSION: Lungs hypoexpanded. Vascular congestion noted. Bibasilar airspace opacities likely reflect pulmonary edema, significantly worsened from the prior study, though pneumonia could have a similar appearance. Electronically Signed   By: Garald Balding M.D.   On: 10/26/2015 00:06   Dg Abd 2 Views  10/29/2015  CLINICAL DATA:  Partial small bowel obstruction. EXAM: ABDOMEN - 2 VIEW COMPARISON:  10/26/2015 FINDINGS: Nasogastric tube no longer visualized. Gaseous dilatation of small bowel remains with some air also present throughout most of the colon. Dilatation of small bowel slightly worse compared to 3/14. Findings remain consistent with partial small bowel obstruction versus generalized ileus. Decubitus film shows no evidence of free intraperitoneal air. No abnormal calcifications identified. IMPRESSION: Slight worsening of partial small bowel obstruction versus ileus. Electronically Signed   By: Aletta Edouard M.D.   On: 10/29/2015 11:39   Dg Abd 2 Views  10/17/2015  CLINICAL DATA:  Follow-up small bowel obstruction EXAM: ABDOMEN - 2 VIEW COMPARISON:  10/16/2015 FINDINGS: Oral contrast is seen primarily throughout the large bowel with air-fluid levels throughout the large bowel. Contrast is seen into the distal sigmoid. Large bowel is relatively decompressed as compared to small bowel, with numerous distended loops of mid abdominal small bowel again identified measuring up to about 4.5 cm in diameter. Small bowel air-fluid levels are stable. There is opacity at the left lung base most consistent  with atelectasis. IMPRESSION: Stable findings of small bowel obstruction Electronically Signed   By: Skipper Cliche M.D.   On: 10/17/2015 08:52   Dg Abd Acute W/chest  10/14/2015  CLINICAL DATA:  Acute onset of generalized abdominal bloating. Initial encounter. EXAM: DG ABDOMEN ACUTE W/ 1V CHEST COMPARISON:  Chest radiograph performed 09/20/2015 FINDINGS: The lungs are well-aerated and clear. There is no evidence of focal opacification, pleural effusion or pneumothorax. The cardiomediastinal silhouette is within normal limits. There is dilatation of small bowel loops up to 5.8 cm in maximal diameter, with associated air-fluid levels, concerning for small bowel obstruction. Residual air is still seen in the colon. No free intra-abdominal air is identified on the provided upright view. Clips are noted within the right upper quadrant, reflecting prior cholecystectomy. Clips are also noted at the right side of the neck. No acute osseous abnormalities are seen; the sacroiliac joints are unremarkable in appearance. IMPRESSION: 1. Dilatation of small bowel loops up to 5.8 cm in maximal diameter, with air-fluid levels, concerning for relatively high-grade small bowel obstruction. 2. No acute cardiopulmonary process seen. These results were called by telephone at the time of interpretation on 10/14/2015 at 11:29 pm to [facility] in the [facility] ER, who verbally acknowledged these results. Electronically Signed   By: Garald Balding M.D.   On: 10/14/2015 23:30   Dg Abd Portable 1v  10/26/2015  CLINICAL DATA:  Followup partial small bowel obstruction. EXAM: PORTABLE ABDOMEN - 1 VIEW COMPARISON:  10/20/2015 and earlier, including CT abdomen and pelvis 10/15/2015. FINDINGS: Persisting gaseous distention of multiple loops of small bowel in the mid abdomen. Gas and stool in normal caliber colon. Nasogastric tube looped in the stomach with its tip in the distal body. No suggestion of free air on the supine image. Prior  cholecystectomy. Developing airspace opacities in both lung bases. IMPRESSION: 1. No significant change since yesterday in the partial small bowel obstruction. 2. Developing pneumonia  in both lung bases. Electronically Signed   By: Evangeline Dakin M.D.   On: 10/26/2015 07:58   Dg Abd Portable 1v  10/20/2015  CLINICAL DATA:  Followup small bowel obstruction. EXAM: PORTABLE ABDOMEN - 1 VIEW COMPARISON:  10/17/2015.  CT, 10/15/2015. FINDINGS: Mild dilation of small bowel is seen centrally. There is air in stool in the colon as well as some contrast in the left colon. The degree of small bowel dilation is mildly decreased from the prior exam. No new abnormalities. IMPRESSION: 1. Mild improvement in the partial small bowel obstruction with less small bowel dilation noted on the current study. Electronically Signed   By: Lajean Manes M.D.   On: 10/20/2015 11:56   Dg Abd Portable 1v  10/16/2015  CLINICAL DATA:  Small bowel obstruction. EXAM: PORTABLE ABDOMEN - 1 VIEW COMPARISON:  CT scan October 15, 2015 FINDINGS: There is contrast within the decompressed colon. Dilated loops of small bowel are seen diffusely, most prominent in the central and left abdomen. No free air, portal venous gas, or pneumatosis identified. No other acute abnormalities. IMPRESSION: Small bowel obstruction. Electronically Signed   By: Dorise Bullion III M.D   On: 10/16/2015 09:24     CBC  Recent Labs Lab 11/09/15 1923  WBC 8.6  HGB 8.5*  HCT 27.7*  PLT 110*  MCV 61.7*  MCH 18.9*  MCHC 30.7  RDW 16.7*  LYMPHSABS 0.3*  MONOABS 0.3  EOSABS 0.0  BASOSABS 0.0    Chemistries   Recent Labs Lab 11/09/15 1923  NA 141  K 3.9  CL 104  CO2 28  GLUCOSE 293*  BUN 32*  CREATININE 1.29*  CALCIUM 8.4*  AST 14*  ALT 16*  ALKPHOS 60  BILITOT 0.4   ------------------------------------------------------------------------------------------------------------------ estimated creatinine clearance is 41.7 mL/min (by C-G formula  based on Cr of 1.29). ------------------------------------------------------------------------------------------------------------------ No results for input(s): HGBA1C in the last 72 hours. ------------------------------------------------------------------------------------------------------------------ No results for input(s): CHOL, HDL, LDLCALC, TRIG, CHOLHDL, LDLDIRECT in the last 72 hours. ------------------------------------------------------------------------------------------------------------------ No results for input(s): TSH, T4TOTAL, T3FREE, THYROIDAB in the last 72 hours.  Invalid input(s): FREET3 ------------------------------------------------------------------------------------------------------------------ No results for input(s): VITAMINB12, FOLATE, FERRITIN, TIBC, IRON, RETICCTPCT in the last 72 hours.  Coagulation profile No results for input(s): INR, PROTIME in the last 168 hours.  No results for input(s): DDIMER in the last 72 hours.  Cardiac Enzymes  Recent Labs Lab 11/09/15 1923  TROPONINI 0.03   ------------------------------------------------------------------------------------------------------------------ Invalid input(s): POCBNP   CBG: No results for input(s): GLUCAP in the last 168 hours.      Assessment/Plan HCAP (healthcare-associated pneumonia): Acute on chronic. Patient recently admitted to the hospital now presents with complaints of productive cough. Chest x-ray showing a bilateral multifocal pneumonia. Patient coughing up thick gray fish looking sputum - Admit to a MedSurg bed - Continuous pulse oximetry with benzocaine oxygen to keep O2 sats greater than 92% - Follow-up  urine studies, sputum and blood cultures  - Empiric antibiotics of vancomycin and Aztreonam - Prednisone 60mg  daily - Continue Spiriva - Albuterol nebs every 4 hours scheduled and when necessary every 2 hours as needed - Budesonide and Brovana Nebs every 12 hours -  Mucinex - Tessalon Perles as needed for cough     COPD exacerbation: Acute on chronic -  as seen above   Acute on chronic systolic heart failure exacerbation: Acute on chronic. Patient's EF 40-45% on last echocardiogram done in 10/2015. Patient reporting increased leg swelling. Physical exam reveals 2+ pitting edema bilateral lower  extremities - Strict ins and outs and daily weights - TED hose -  Lasix 40 mg IV daily as tolerated - Continue aspirin  Possible UTI - Follow-up urine culture  Hyperglycemia: Suspected to be steroid-induced however patient blood glucose on arrival is 293. Previously seen have elevated hemoglobin A1c of 6.6 back in 2012  - Check hemoglobin A1c - Cbg every 4 hours for now on a sensitive sliding scale of insulin  Iron deficiency anemia - Continue ferrous sulfate  Essential hypertension - Continue losartan, Coreg  Gerd - Protonix  Pressure ulcer - Wound care consult   Code Status:   full Family Communication: bedside Disposition Plan: admit   Total time spent 55 minutes.Greater than 50% of this time was spent in counseling, explanation of diagnosis, planning of further management, and coordination of care  [facility] Pager [phone]  If 7PM-7AM, please contact night-coverage [email or web address] Password [facility] 11/09/2015, 10:09 PM

## 2015-11-09 NOTE — Progress Notes (Signed)
Patient ID: [name], male   DOB: 1923/10/14, 82 y.o.   MRN: [id number]   Facility: [facility]         Allergies  Allergen Reactions  . Clopidogrel Bisulfate Hives    REACTION: red blue, black blotches  . Dilaudid [Hydromorphone Hcl] Hives    Hallucinations, little green men working on the building  . Penicillins Hives    Has patient had a PCN reaction causing immediate rash, facial/tongue/throat swelling, SOB or lightheadedness with hypotension: Yes Has patient had a PCN reaction causing severe rash involving mucus membranes or skin necrosis: No Has patient had a PCN reaction that required hospitalization No Has patient had a PCN reaction occurring within the last 10 years: No If all of the above answers are "NO", then may proceed with Cephalosporin use.   . Sulfa Antibiotics Other (See Comments)    Nightmares, hallucinations  . Sulfonamide Derivatives Other (See Comments)    Nightmares, hallucinations  . Doxycycline Hyclate Rash    Chief Complaint  Patient presents with  . Acute Visit    HPI:  He is complaining of increased cough with increased shortness of breath and sputum production. The sputum is yellow. There are no reports of fever present. He states that he is feeling worse than he did last week.    Past Medical History  Diagnosis Date  . Carotid artery stenosis   . Essential hypertension   . Myocardial infarction ([place]) 1979  . Coronary artery disease     Known occluded proximal LAD with collaterals, moderate RCA disease - managed medically 2009  . COPD (chronic obstructive pulmonary disease) ([place])   . History of stroke   . Prostate cancer ([place])   . Arthritis   . Foot drop, right   . Neuropathy ([place])   . Anemia   . Urinary incontinence, male, stress   . GERD (gastroesophageal reflux disease)   . Thalassemia   . Wears dentures   . Deafness in left ear     Wears hearing aide in right ear  . Sleep apnea     CPAP  . Ischemic cardiomyopathy     LVEF  40-45%  . Hypercholesteremia   . Anemia   . Osteoarthritis   . Stroke ([place])   . Heart attack ([place])   . Vertigo   . Hereditary and idiopathic peripheral neuropathy 03/01/2015  . Abnormality of gait 03/01/2015    Past Surgical History  Procedure Laterality Date  . Penile prosthesis implant    . Cholecystectomy    . Right carotid endarterectomy    . Back surgery    . Lumbar laminectomy  03/24/10    L3-4 and L4-5  . Ankle surgery      Bone infection  . Cardiac catheterization    . Appendectomy    . Joint replacement Left   . Radioactive seed implant  2009  . Pars plana vitrectomy  03/19/2012    Procedure: PARS PLANA VITRECTOMY WITH 25 GAUGE;  Surgeon: Hayden Pedro, MD;  Location: [facility];  Service: Ophthalmology;  Laterality: Right;  . Carotid endarterectomy Right     CEA - 15 yrs ago  . Spine surgery    . Eye surgery    . Cataract extraction w/ intraocular lens implant Left   . Endarterectomy Left 09/28/2014    Procedure: Left Carotid Endarterectomy;  Surgeon: Rosetta Posner, MD;  Location: [facility];  Service: Vascular;  Laterality: Left;  . Patch angioplasty Left 09/28/2014  Procedure: WITH DACRON PATCH ANGIOPLASTY;  Surgeon: Rosetta Posner, MD;  Location[facility];  Service: Vascular;  Laterality: Left;    VITAL SIGNS BP 136/58 mmHg  Pulse 65  Temp(Src) 98 F (36.7 C) (Oral)  Resp 20  Ht 6' (1.829 m)  Wt 167 lb 4 oz (75.864 kg)  BMI 22.68 kg/m2  Patient's Medications  New Prescriptions   No medications on file  Previous Medications   ACETAMINOPHEN (TYLENOL) 325 MG TABLET    Take 650 mg by mouth every 6 (six) hours as needed for mild pain.    ASPIRIN EC 81 MG TABLET    Take 81 mg by mouth daily.   CARVEDILOL (COREG) 12.5 MG TABLET    TAKE 1 TABLET TWICE DAILY WITH A MEAL   FERROUS SULFATE 325 (65 FE) MG TABLET    Take 1 tablet (325 mg total) by mouth 2 (two) times daily with a meal.   FUROSEMIDE (LASIX) 20 MG TABLET    Take 1 tablet (20 mg total) by mouth daily.    GLYCOPYRROLATE-FORMOTEROL (BEVESPI AEROSPHERE) 9-4.8 MCG/ACT AERO    Inhale 2 puffs into the lungs every 12 (twelve) hours.   GUAIFENESIN (ROBITUSSIN) 100 MG/5ML SYRUP    Take 10 mg by mouth every 4 (four) hours as needed for cough.   IPRATROPIUM-ALBUTEROL (DUONEB) 0.5-2.5 (3) MG/3ML SOLN    Take 3 mLs by nebulization every 6 (six) hours as needed.   LINZESS 145 MCG CAPS CAPSULE    Take 1 capsule by mouth daily with breakfast.   LOSARTAN (COZAAR) 25 MG TABLET    Take 1 tablet (25 mg total) by mouth daily.   MECLIZINE (ANTIVERT) 25 MG TABLET    Take 1 tablet (25 mg total) by mouth daily as needed for dizziness.   METOCLOPRAMIDE (REGLAN) 5 MG TABLET    Take 1 tablet (5 mg total) by mouth 3 (three) times daily before meals.   MOMETASONE-FORMOTEROL (DULERA) 200-5 MCG/ACT AERO    Inhale 2 puffs into the lungs 2 (two) times daily.   MYRBETRIQ 50 MG TB24 TABLET    Take 50 mg by mouth daily.   NITROSTAT 0.4 MG SL TABLET    PLACE 1 TABLET (0.4 MG TOTAL) UNDER THE TONGUE EVERY 5 (FIVE) MINUTES AS NEEDED. FOR CHEST PAIN   NUTRITIONAL SUPPLEMENTS (NUTRITIONAL SUPPLEMENT PO)    Take 120 mLs by mouth 2 (two) times daily. Medpass   OMEPRAZOLE (PRILOSEC) 40 MG CAPSULE    Take 1 capsule (40 mg total) by mouth daily.   OXYGEN    Inhale into the lungs. 2L/min   POLYETHYLENE GLYCOL (MIRALAX / GLYCOLAX) PACKET    Take 17 g by mouth 2 (two) times daily.   PREGABALIN (LYRICA) 75 MG CAPSULE    Take 75 mg by mouth 2 (two) times daily.    SENNA-DOCUSATE (SENOKOT-S) 8.6-50 MG TABLET    Take 1 tablet by mouth 2 (two) times daily.   SIMVASTATIN (ZOCOR) 10 MG TABLET    Take 10 mg by mouth daily at 6 PM.    TIOTROPIUM (SPIRIVA) 18 MCG INHALATION CAPSULE    Place 1 capsule (18 mcg total) into inhaler and inhale daily.   VENTOLIN HFA 108 (90 BASE) MCG/ACT INHALER    INHALE 2 PUFFS INTO THE LUNGS EVERY 4 HOURS AS NEEDED FOR WHEEZING OR SHORTNESS OF BREATH  Modified Medications   No medications on file  Discontinued Medications     No medications on file     SIGNIFICANT DIAGNOSTIC  EXAMS  10-24-15: chest x-ray: 1. New bilateral pleural effusions and posterior lower lobe airspace disease. While this may represent atelectasis, infection is not excluded. 2. Emphysema. 3. Atherosclerosis of the thoracic aorta.  10-26-15: chest x-ray: Lungs hypoexpanded. Vascular congestion noted. Bibasilar airspace opacities likely reflect pulmonary edema, significantly worsened from the prior study, though pneumonia could have a similar appearance.  10-26-15: kub: . No significant change since yesterday in the partial small bowel obstruction. 2. Developing pneumonia in both lung bases.   10-26-15: TEE: Left ventricle: The cavity size was normal. Systolic function was mildly to moderately reduced. The estimated ejection fraction was in the range of 40% to 45%. There is akinesis of the mid-apicalanteroseptal and apical myocardium. Doppler parameters are consistent with abnormal left ventricular relaxation (grade 1 diastolic dysfunction). - Aortic valve: Trileaflet; mildly thickened, mildly calcified leaflets.  10-27-15: right upper extremity doppler: No evidence of deep vein thrombosis involving the right upper extremity and left subclavian vein. - Findings consistent with superficial vein thrombosis involving the right cephalic vein.  10-28-15: chest x-ray: Interval improvement in bilateral airspace disease consistent with clearing edema. Endotracheal tube removed in the interval.  10-29-15: kub: Slight worsening of partial small bowel obstruction versus ileus.  10-29-15: bilateral lower extremity doppler: No evidence of deep vein thrombosis involving the right lower extremity and left lower extremity. - No evidence of Baker&'s cyst on the right or left.  11-07-15: right upper extremity doppler: negative for dvt  11-09-15: chest x-ray: 1. Moderate patchy bibasilar densities compatible with pneumonia. 2. Mild osteoarthritis.    LABS  REVIEWED:   10-24-15; wbc 7.4; hgb 7.9; hct 25.5; mcv 61.2; plt 186; glucose 113; bun 18; creat 1.38; k+ 4.5; na++139; liver normal albumin 3.1; flu: neg 10-27-15: wbc 14.3; hgb 8.5; hct 27.6; mcv 60.5; plt 241; glucose 175; bun 34; creat 1.8; k+ 4.4; na++137; albumin 2.6;  phos 3.1  11-02-15: wbc 7.1 hgb 8.5; hct 27.8; mcv 63.2; plt 179; glucose 150; bun 31; creat 1.29; k+ 3.8; na++141; albumin 2.4;  phos 2.3; mag 2.1     Review of Systems  Constitutional: Negative for malaise/fatigue.  Respiratory: has cough; shortness of breath and sputum production   Cardiovascular: Negative for chest pain, palpitations has edema   Gastrointestinal: Negative for heartburn, abdominal pain and constipation.  Musculoskeletal: Negative for myalgias, back pain and joint pain.  Skin: Negative.   Neurological: Negative for dizziness.  Psychiatric/Behavioral: The patient is not nervous/anxious.     Physical Exam  Constitutional: He is oriented to person, place, and time. No distress.  Eyes: Conjunctivae are normal.  Neck: Neck supple. No JVD present. No thyromegaly present.  Cardiovascular: Normal rate, regular rhythm and intact distal pulses.   Respiratory: increase effort; wheezes; rhonchi; rales present; yellow sputum present  GI: Soft. Bowel sounds are normal. He exhibits no distension. There is no tenderness.  Musculoskeletal: He exhibits 1+ lower extremity edema  Able to move all extremities   Lymphadenopathy:    He has no cervical adenopathy.  Neurological: He is alert and oriented to person, place, and time.  Skin: Skin is warm and dry. He is not diaphoretic.  Psychiatric: He has a normal mood and affect.     ASSESSMENT/ PLAN:  1. Acute on chronic combined systolic and diastolic heart failure: EF is 40-45%; will  coreg 12.5 mg twice daily  Will increase her lasix to 40 mg daily  will monitor  2. COPD: will continue beve psi aero sphere 9-4.8 mcg 2 puffs  twice daly; spiriva 18 mcg daily ;  dulera 200-5 2 puffs twice daily and albuterol 2 puffs every 4 hours as needed is on 02 will begin duoneb every 6 hours   3. Pneumonia: will begin levaquin 750 mg daily for 10 days with florastor twice daily and will repeat chest x-ray after completing abt and will monitor his status.   4.  Right upper extremity superficial thrombus: the doppler is negative; will stop the lovenox      Ok Edwards NP [facility]  Contact [phone] Monday through Friday 8am- 5pm  After hours call [phone]

## 2015-11-09 NOTE — ED Provider Notes (Signed)
CSN: [id number]     Arrival date & time 11/09/15  1900 History   First MD Initiated Contact with Patient 11/09/15 1922     Chief Complaint  Patient presents with  . Pneumonia      HPI Pt was seen at 1930. Per EMS, NH report and pt: c/o gradual onset and worsening of persistent SOB, cough and wheezing for the past 1 week. Pt states the NH performed a CXR today and told him he had "double pneumonia," then sent him to the ED for further evaluation. Unknown fevers. Denies CP/palpitations, no abd pain, no N/V/D, no back pain.    Past Medical History  Diagnosis Date  . Carotid artery stenosis   . Essential hypertension   . Myocardial infarction ([place]) 1979  . Coronary artery disease     Known occluded proximal LAD with collaterals, moderate RCA disease - managed medically 2009  . COPD (chronic obstructive pulmonary disease) ([place])   . History of stroke   . Prostate cancer ([place])   . Arthritis   . Foot drop, right   . Neuropathy ([place])   . Anemia   . Urinary incontinence, male, stress   . GERD (gastroesophageal reflux disease)   . Thalassemia   . Wears dentures   . Deafness in left ear     Wears hearing aide in right ear  . Sleep apnea     CPAP  . Ischemic cardiomyopathy     LVEF 40-45%  . Hypercholesteremia   . Anemia   . Osteoarthritis   . Stroke ([place])   . Heart attack ([place])   . Vertigo   . Hereditary and idiopathic peripheral neuropathy 03/01/2015  . Abnormality of gait 03/01/2015   Past Surgical History  Procedure Laterality Date  . Penile prosthesis implant    . Cholecystectomy    . Right carotid endarterectomy    . Back surgery    . Lumbar laminectomy  03/24/10    L3-4 and L4-5  . Ankle surgery      Bone infection  . Cardiac catheterization    . Appendectomy    . Joint replacement Left   . Radioactive seed implant  2009  . Pars plana vitrectomy  03/19/2012    Procedure: PARS PLANA VITRECTOMY WITH 25 GAUGE;  Surgeon: Hayden Pedro, MD;  Location: [facility];  Service:  Ophthalmology;  Laterality: Right;  . Carotid endarterectomy Right     CEA - 15 yrs ago  . Spine surgery    . Eye surgery    . Cataract extraction w/ intraocular lens implant Left   . Endarterectomy Left 09/28/2014    Procedure: Left Carotid Endarterectomy;  Surgeon: Rosetta Posner, MD;  Location: [facility];  Service: Vascular;  Laterality: Left;  . Patch angioplasty Left 09/28/2014    Procedure: WITH DACRON PATCH ANGIOPLASTY;  Surgeon: Rosetta Posner, MD;  [facility];  Service: Vascular;  Laterality: Left;   Family History  Problem Relation Age of Onset  . Thalassemia      Family history  . Cancer Mother 51    Bone cancer  . Diabetes Mother   . Heart disease Mother   . Heart attack Mother   . Hypertension Mother   . Stroke Father 37  . Heart disease Father   . Hypertension Father   . Hypertension Sister   . Diabetes Sister   . Heart disease Brother   . Heart attack Brother 105  . Cancer Brother    Social  History  Substance Use Topics  . Smoking status: Former Smoker -- 1.00 packs/day for 20 years    Types: Cigarettes    Quit date: 08/14/1977  . Smokeless tobacco: Never Used  . Alcohol Use: No     Comment: occasional    Review of Systems ROS: Statement: All systems negative except as marked or noted in the HPI; Constitutional: Unknown fever and chills. ; ; Eyes: Negative for eye pain, redness and discharge. ; ; ENMT: Negative for ear pain, hoarseness, nasal congestion, sinus pressure and sore throat. ; ; Cardiovascular: Negative for chest pain, palpitations, diaphoresis, and peripheral edema. ; ; Respiratory: +cough, wheezing, SOB. Negative for stridor. ; ; Gastrointestinal: Negative for nausea, vomiting, diarrhea, abdominal pain, blood in stool, hematemesis, jaundice and rectal bleeding. . ; ; Genitourinary: Negative for dysuria, flank pain and hematuria. ; ; Musculoskeletal: Negative for back pain and neck pain. Negative for swelling and trauma.; ; Skin: Negative for pruritus,  rash, abrasions, blisters, bruising and skin lesion.; ; Neuro: Negative for headache, lightheadedness and neck stiffness. Negative for weakness, altered level of consciousness , altered mental status, extremity weakness, paresthesias, involuntary movement, seizure and syncope.      Allergies  Clopidogrel bisulfate; Dilaudid; Penicillins; Sulfa antibiotics; Sulfonamide derivatives; and Doxycycline hyclate  Home Medications   Prior to Admission medications   Medication Sig Start Date End Date Taking? Authorizing Provider  aspirin EC 81 MG tablet Take 81 mg by mouth daily.   Yes Historical Provider, MD  carvedilol (COREG) 12.5 MG tablet TAKE 1 TABLET TWICE DAILY WITH A MEAL 06/08/15  Yes Dorothy Spark, MD  enoxaparin (LOVENOX) 40 MG/0.4ML injection Inject 40 mg into the skin daily.   Yes Historical Provider, MD  ferrous sulfate 325 (65 FE) MG tablet Take 1 tablet (325 mg total) by mouth 2 (two) times daily with a meal. 11/02/15  Yes Lavina Hamman, MD  furosemide (LASIX) 20 MG tablet Take 1 tablet (20 mg total) by mouth daily. 11/02/15  Yes Lavina Hamman, MD  Glycopyrrolate-Formoterol (BEVESPI AEROSPHERE) 9-4.8 MCG/ACT AERO Inhale 2 puffs into the lungs every 12 (twelve) hours. 07/19/15  Yes Tanda Rockers, MD  guaifenesin (ROBITUSSIN) 100 MG/5ML syrup Take 10 mg by mouth every 4 (four) hours as needed for cough.   Yes Historical Provider, MD  ipratropium-albuterol (DUONEB) 0.5-2.5 (3) MG/3ML SOLN Take 3 mLs by nebulization every 6 (six) hours as needed.   Yes Historical Provider, MD  LINZESS 145 MCG CAPS capsule Take 1 capsule by mouth daily with breakfast. 06/29/15  Yes Historical Provider, MD  losartan (COZAAR) 25 MG tablet Take 1 tablet (25 mg total) by mouth daily. 01/14/15  Yes Marijean Heath, NP  metoCLOPramide (REGLAN) 5 MG tablet Take 1 tablet (5 mg total) by mouth 3 (three) times daily before meals. 11/02/15  Yes Lavina Hamman, MD  mometasone-formoterol (DULERA) 200-5 MCG/ACT AERO  Inhale 2 puffs into the lungs 2 (two) times daily. 10/01/15  Yes Tanda Rockers, MD  MYRBETRIQ 50 MG TB24 tablet Take 50 mg by mouth daily. 06/29/15  Yes Historical Provider, MD  omeprazole (PRILOSEC) 20 MG capsule Take 40 mg by mouth daily.   Yes Historical Provider, MD  polyethylene glycol (MIRALAX / GLYCOLAX) packet Take 17 g by mouth 2 (two) times daily. 11/02/15  Yes Lavina Hamman, MD  predniSONE (DELTASONE) 10 MG tablet Take 10-20 mg by mouth daily with breakfast. Take 2 tablets for 3 days and Take 1 tablet for 3 days.  Yes Historical Provider, MD  pregabalin (LYRICA) 75 MG capsule Take 75 mg by mouth 2 (two) times daily.    Yes Historical Provider, MD  senna-docusate (SENOKOT-S) 8.6-50 MG tablet Take 1 tablet by mouth 2 (two) times daily. 11/02/15  Yes Lavina Hamman, MD  simvastatin (ZOCOR) 10 MG tablet Take 10 mg by mouth daily at 6 PM.  10/11/15  Yes Historical Provider, MD  tiotropium (SPIRIVA) 18 MCG inhalation capsule Place 1 capsule (18 mcg total) into inhaler and inhale daily. 09/23/15  Yes Maryann Mikhail, DO  VENTOLIN HFA 108 (90 BASE) MCG/ACT inhaler INHALE 2 PUFFS INTO THE LUNGS EVERY 4 HOURS AS NEEDED FOR WHEEZING OR SHORTNESS OF BREATH 07/12/15  Yes Tanda Rockers, MD  acetaminophen (TYLENOL) 325 MG tablet Take 650 mg by mouth every 6 (six) hours as needed for mild pain.     Historical Provider, MD  meclizine (ANTIVERT) 25 MG tablet Take 1 tablet (25 mg total) by mouth daily as needed for dizziness. 01/29/15   Dorothy Spark, MD  NITROSTAT 0.4 MG SL tablet PLACE 1 TABLET (0.4 MG TOTAL) UNDER THE TONGUE EVERY 5 (FIVE) MINUTES AS NEEDED. FOR CHEST PAIN 04/08/15   Dorothy Spark, MD  omeprazole (PRILOSEC) 40 MG capsule Take 1 capsule (40 mg total) by mouth daily. Patient not taking: Reported on 11/09/2015 05/21/15   Dorothy Spark, MD   BP 129/59 mmHg  Pulse 74  Temp(Src) 98.1 F (36.7 C) (Oral)  Resp 18  SpO2 94% Physical Exam  1935: Physical examination:  Nursing notes  reviewed; Vital signs and O2 SAT reviewed;  Constitutional: Well developed, Well nourished, Well hydrated, In no acute distress; Head:  Normocephalic, atraumatic; Eyes: EOMI, PERRL, No scleral icterus; ENMT: Mouth and pharynx normal, Mucous membranes moist; Neck: Supple, Full range of motion, No lymphadenopathy; Cardiovascular: Regular rate and rhythm, No gallop; Respiratory: Breath sounds clear & equal bilaterally, scattered wheezes. No audible wheezing. +moist cough during exam. Speaking sentences, Normal respiratory effort/excursion; Chest: Nontender, Movement normal; Abdomen: Soft, Nontender, Nondistended, Normal bowel sounds; Genitourinary: No CVA tenderness; Extremities: Pulses normal, No tenderness, +2 pedal edema bilat, No calf asymmetry.; Neuro: AA&Ox3, Major CN grossly intact.  Speech clear. No gross focal motor or sensory deficits in extremities.; Skin: Color normal, Warm, Dry.   ED Course  Procedures (including critical care time) Labs Review   Imaging Review  I have personally reviewed and evaluated these images and lab results as part of my medical decision-making.   EKG Interpretation   Date/Time:  Tuesday November 09 2015 19:44:21 EDT Ventricular Rate:  75 PR Interval:  191 QRS Duration: 87 QT Interval:  412 QTC Calculation: 460 R Axis:   66 Text Interpretation:  Normal sinus rhythm Ventricular tachycardia,  unsustained Probable anterolateral infarct, age indeterm Baseline wander  Artifact When compared with ECG of 01/09/2015 Artifact is now Present  Otherwise no significant change Confirmed by [facility]  MD, Nunzio Cory [phone])  on 11/09/2015 7:57:33 PM      MDM  MDM Reviewed: previous chart, nursing note and vitals Reviewed previous: labs and ECG Interpretation: labs, ECG and x-ray     Results for orders placed or performed during the hospital encounter of 11/09/15  Comprehensive metabolic panel  Result Value Ref Range   Sodium 141 135 - 145 mmol/L   Potassium 3.9  3.5 - 5.1 mmol/L   Chloride 104 101 - 111 mmol/L   CO2 28 22 - 32 mmol/L   Glucose, Bld 293 (H) 65 -  99 mg/dL   BUN 32 (H) 6 - 20 mg/dL   Creatinine, Ser 1.29 (H) 0.61 - 1.24 mg/dL   Calcium 8.4 (L) 8.9 - 10.3 mg/dL   Total Protein 6.0 (L) 6.5 - 8.1 g/dL   Albumin 2.6 (L) 3.5 - 5.0 g/dL   AST 14 (L) 15 - 41 U/L   ALT 16 (L) 17 - 63 U/L   Alkaline Phosphatase 60 38 - 126 U/L   Total Bilirubin 0.4 0.3 - 1.2 mg/dL   GFR calc non Af Amer 47 (L) >60 mL/min   GFR calc Af Amer 55 (L) >60 mL/min   Anion gap 9 5 - 15  CBC with Differential  Result Value Ref Range   WBC 8.6 4.0 - 10.5 K/uL   RBC 4.49 4.22 - 5.81 MIL/uL   Hemoglobin 8.5 (L) 13.0 - 17.0 g/dL   HCT 27.7 (L) 39.0 - 52.0 %   MCV 61.7 (L) 78.0 - 100.0 fL   MCH 18.9 (L) 26.0 - 34.0 pg   MCHC 30.7 30.0 - 36.0 g/dL   RDW 16.7 (H) 11.5 - 15.5 %   Platelets 110 (L) 150 - 400 K/uL   Neutrophils Relative % 93 %   Lymphocytes Relative 4 %   Monocytes Relative 3 %   Eosinophils Relative 0 %   Basophils Relative 0 %   Neutro Abs 8.0 (H) 1.7 - 7.7 K/uL   Lymphs Abs 0.3 (L) 0.7 - 4.0 K/uL   Monocytes Absolute 0.3 0.1 - 1.0 K/uL   Eosinophils Absolute 0.0 0.0 - 0.7 K/uL   Basophils Absolute 0.0 0.0 - 0.1 K/uL   RBC Morphology ELLIPTOCYTES    Smear Review PLATELET COUNT CONFIRMED BY SMEAR   Lactic acid, plasma  Result Value Ref Range   Lactic Acid, Venous 2.4 (HH) 0.5 - 2.0 mmol/L  Troponin I  Result Value Ref Range   Troponin I 0.03 <0.031 ng/mL  Brain natriuretic peptide  Result Value Ref Range   B Natriuretic Peptide 423.8 (H) 0.0 - 100.0 pg/mL   Dg Chest 2 View 11/09/2015  CLINICAL DATA:  Shortness of breath. EXAM: CHEST  2 VIEW COMPARISON:  10/28/2015 chest radiograph. FINDINGS: Surgical clips in the medial lower right neck. Stable cardiomediastinal silhouette with normal heart size. No pneumothorax. No pleural effusion. Patchy consolidation throughout both lower lobes. No pulmonary edema. IMPRESSION: Patchy bilateral lower  lobe consolidation, most suggestive of multilobar pneumonia. Recommend follow-up PA and lateral post treatment chest radiographs in 4-6 weeks. Electronically Signed   By: Ilona Sorrel M.D.   On: 11/09/2015 20:38    2100:  Short neb given on arrival for wheezing with improvement. VS remain stable. CXR with worsening infiltrates compared to previous. Will dose IV abx for HCAP.  T/C to [facility]. Tamala Julian, case discussed, including:  HPI, pertinent PM/SHx, VS/PE, dx testing, ED course and treatment:  Agreeable to admit, requests to write temporary orders, obtain medical bed to team [facility].    Francine Graven, DO 11/10/15 2344

## 2015-11-09 NOTE — ED Notes (Signed)
Bed: [id number] Expected date:  Expected time:  Means of arrival:  Comments: 89/fever/resp. distress

## 2015-11-09 NOTE — ED Notes (Signed)
According to EMS, pt has confirmed Pneumonia Dx'd on today at [facility]. Pt x/o of SOB and cough. Pt A+OX4, hard of hearing, denies pain.

## 2015-11-09 NOTE — ED Notes (Addendum)
Pt and daughter advised of MD order for U/A, culture. Pt and daughter acknowledges, advises semi-incontinent, unable to go at this time. Urinal provided at bedside. ENM

## 2015-11-09 NOTE — Progress Notes (Signed)
Pharmacy Antibiotic Note  [name] is a 82 y.o. male with a past medical history of chronic obstructive pulmonary disease, Stage III chronic disease, hypertension admitted on 11/09/2015 with pneumonia.  Pt recently discharged on 3/21 from [facility]. Pharmacy has been consulted for vancomycin dosing.  CrCl (N) ~32mls/min  Plan: Vancomycin 1gm IV q24h Follow renal function, cultures, clinical course     Temp (24hrs), Avg:98.2 F (36.8 C), Min:98 F (36.7 C), Max:98.4 F (36.9 C)   Recent Labs Lab 11/09/15 1923 11/09/15 1941  WBC 8.6  --   CREATININE 1.29*  --   LATICACIDVEN  --  2.4*    Estimated Creatinine Clearance: 41.7 mL/min (by C-G formula based on Cr of 1.29).    Allergies  Allergen Reactions  . Clopidogrel Bisulfate Hives    REACTION: red blue, black blotches  . Dilaudid [Hydromorphone Hcl] Hives    Hallucinations, little green men working on the building  . Penicillins Hives    Has patient had a PCN reaction causing immediate rash, facial/tongue/throat swelling, SOB or lightheadedness with hypotension: Yes Has patient had a PCN reaction causing severe rash involving mucus membranes or skin necrosis: No Has patient had a PCN reaction that required hospitalization No Has patient had a PCN reaction occurring within the last 10 years: No If all of the above answers are "NO", then may proceed with Cephalosporin use.   . Sulfa Antibiotics Other (See Comments)    Nightmares, hallucinations  . Sulfonamide Derivatives Other (See Comments)    Nightmares, hallucinations  . Doxycycline Hyclate Rash    Antimicrobials this admission: 3/28 aztreonam x 1 3/28 vancomycin >> Dose adjustments this admission:   Microbiology results: 3/28 BCx: x2  Thank you for allowing pharmacy to be a part of this patient's care.  Dolly Rias RPh 11/09/2015, 9:17 PM Pager [phone]

## 2015-11-10 ENCOUNTER — Encounter (HOSPITAL_COMMUNITY): Payer: Self-pay | Admitting: *Deleted

## 2015-11-10 ENCOUNTER — Inpatient Hospital Stay (HOSPITAL_COMMUNITY): Payer: Medicare Other

## 2015-11-10 DIAGNOSIS — L8990 Pressure ulcer of unspecified site, unspecified stage: Secondary | ICD-10-CM | POA: Diagnosis present

## 2015-11-10 DIAGNOSIS — R7881 Bacteremia: Secondary | ICD-10-CM

## 2015-11-10 DIAGNOSIS — K219 Gastro-esophageal reflux disease without esophagitis: Secondary | ICD-10-CM

## 2015-11-10 DIAGNOSIS — I5043 Acute on chronic combined systolic (congestive) and diastolic (congestive) heart failure: Secondary | ICD-10-CM

## 2015-11-10 DIAGNOSIS — J189 Pneumonia, unspecified organism: Secondary | ICD-10-CM

## 2015-11-10 DIAGNOSIS — D509 Iron deficiency anemia, unspecified: Secondary | ICD-10-CM

## 2015-11-10 DIAGNOSIS — J438 Other emphysema: Secondary | ICD-10-CM

## 2015-11-10 LAB — CBC WITH DIFFERENTIAL/PLATELET
BASOS ABS: 0 10*3/uL (ref 0.0–0.1)
BASOS PCT: 0 %
EOS PCT: 1 %
Eosinophils Absolute: 0.1 10*3/uL (ref 0.0–0.7)
HEMATOCRIT: 27.5 % — AB (ref 39.0–52.0)
HEMOGLOBIN: 8.5 g/dL — AB (ref 13.0–17.0)
LYMPHS PCT: 8 %
Lymphs Abs: 0.8 10*3/uL (ref 0.7–4.0)
MCH: 18.7 pg — AB (ref 26.0–34.0)
MCHC: 30.9 g/dL (ref 30.0–36.0)
MCV: 60.6 fL — AB (ref 78.0–100.0)
MONOS PCT: 4 %
Monocytes Absolute: 0.4 10*3/uL (ref 0.1–1.0)
NEUTROS ABS: 8.8 10*3/uL — AB (ref 1.7–7.7)
Neutrophils Relative %: 87 %
Platelets: 121 10*3/uL — ABNORMAL LOW (ref 150–400)
RBC: 4.54 MIL/uL (ref 4.22–5.81)
RDW: 16.6 % — ABNORMAL HIGH (ref 11.5–15.5)
WBC: 10.1 10*3/uL (ref 4.0–10.5)

## 2015-11-10 LAB — EXPECTORATED SPUTUM ASSESSMENT W GRAM STAIN, RFLX TO RESP C

## 2015-11-10 LAB — BASIC METABOLIC PANEL
ANION GAP: 9 (ref 5–15)
BUN: 25 mg/dL — ABNORMAL HIGH (ref 6–20)
CHLORIDE: 104 mmol/L (ref 101–111)
CO2: 30 mmol/L (ref 22–32)
Calcium: 8.3 mg/dL — ABNORMAL LOW (ref 8.9–10.3)
Creatinine, Ser: 1.07 mg/dL (ref 0.61–1.24)
GFR calc Af Amer: >60 mL/min (ref >60)
GFR calc non Af Amer: 59 mL/min — ABNORMAL LOW (ref >60)
GLUCOSE: 109 mg/dL — AB (ref 65–99)
POTASSIUM: 3.4 mmol/L — AB (ref 3.5–5.1)
Sodium: 143 mmol/L (ref 135–145)

## 2015-11-10 LAB — EXPECTORATED SPUTUM ASSESSMENT W REFEX TO RESP CULTURE

## 2015-11-10 LAB — LACTIC ACID, PLASMA: Lactic Acid, Venous: 1.4 mmol/L (ref 0.5–2.0)

## 2015-11-10 LAB — GLUCOSE, CAPILLARY
GLUCOSE-CAPILLARY: 172 mg/dL — AB (ref 65–99)
GLUCOSE-CAPILLARY: 208 mg/dL — AB (ref 65–99)
GLUCOSE-CAPILLARY: 222 mg/dL — AB (ref 65–99)
Glucose-Capillary: 120 mg/dL — ABNORMAL HIGH (ref 65–99)
Glucose-Capillary: 119 mg/dL — ABNORMAL HIGH (ref 65–99)
Glucose-Capillary: 259 mg/dL — ABNORMAL HIGH (ref 65–99)
Glucose-Capillary: 211 mg/dL — ABNORMAL HIGH (ref 65–99)

## 2015-11-10 LAB — URINALYSIS, ROUTINE W REFLEX MICROSCOPIC
BILIRUBIN URINE: NEGATIVE
Glucose, UA: NEGATIVE mg/dL
KETONES UR: NEGATIVE mg/dL
NITRITE: NEGATIVE
PH: 5.5 (ref 5.0–8.0)
Protein, ur: NEGATIVE mg/dL
Specific Gravity, Urine: 1.021 (ref 1.005–1.030)

## 2015-11-10 LAB — URINE MICROSCOPIC-ADD ON

## 2015-11-10 LAB — STREP PNEUMONIAE URINARY ANTIGEN: STREP PNEUMO URINARY ANTIGEN: NEGATIVE

## 2015-11-10 MED ORDER — MAGIC MOUTHWASH
5.0000 mL | Freq: Four times a day (QID) | ORAL | Status: DC
  Administered 2015-11-10 – 2015-11-15 (×21): 5 mL via ORAL
  Filled 2015-11-10 (×24): qty 5

## 2015-11-10 MED ORDER — ALBUTEROL SULFATE (2.5 MG/3ML) 0.083% IN NEBU
2.5000 mg | INHALATION_SOLUTION | Freq: Four times a day (QID) | RESPIRATORY_TRACT | Status: DC
  Administered 2015-11-10 – 2015-11-14 (×14): 2.5 mg via RESPIRATORY_TRACT
  Filled 2015-11-10 (×15): qty 3

## 2015-11-10 MED ORDER — DEXTROSE 5 % IV SOLN
1.0000 g | Freq: Three times a day (TID) | INTRAVENOUS | Status: DC
  Administered 2015-11-10: 1 g via INTRAVENOUS
  Filled 2015-11-10 (×2): qty 1

## 2015-11-10 MED ORDER — PREDNISONE 50 MG PO TABS
60.0000 mg | ORAL_TABLET | Freq: Every day | ORAL | Status: DC
  Administered 2015-11-10 – 2015-11-12 (×3): 60 mg via ORAL
  Filled 2015-11-10 (×6): qty 1

## 2015-11-10 MED ORDER — DEXTROSE 5 % IV SOLN
1.0000 g | Freq: Two times a day (BID) | INTRAVENOUS | Status: DC
  Administered 2015-11-10 – 2015-11-11 (×2): 1 g via INTRAVENOUS
  Filled 2015-11-10 (×3): qty 1

## 2015-11-10 NOTE — Clinical Social Work Note (Signed)
Clinical Social Work Assessment  Patient Details  Name: [name] MRN: [id number] Date of Birth: 10-20-1923  Date of referral:  11/10/15               Reason for consult:  Facility Placement, Discharge Planning                Permission sought to share information with:  Chartered certified accountant granted to share information::  Yes, Verbal Permission Granted  Name::        Agency::     Relationship::     Contact Information:     Housing/Transportation Living arrangements for the past 2 months:  [place], [place] of Information:  Patient, Adult Children Patient Interpreter Needed:  None Criminal Activity/Legal Involvement Pertinent to Current Situation/Hospitalization:  No - Comment as needed Significant Relationships:  Adult Children Lives with:  Self, Facility Resident Do you feel safe going back to the place where you live?  Yes Need for family participation in patient care:  Yes (Comment)  Care giving concerns:  Daughter wonders if pt was d/c from hospital too soon.   Social Worker assessment / plan: Pt hospitalized on 11/09/15 with HCAP. Pt had recently been hospital at [facility] and transferred to [facility] for [place]. CSW met with pt and spoke with pt's daughter ( with pt's permission ) to assist with d/c planning. Pt / daughter think ST Rehab will most likely be needed at d/c. Pt isn't sure if he will return to [facility] or to a different facility. CSW has initiated SNF search and will provide bed offers when available. [facility] contacted and will readmit pt, if pt would like to return, at d/c. CSW will assist with [facility] authorization once PT recommendations are available.  Employment status:  Retired Nurse, adult PT Recommendations:  Not assessed at this time Information / Referral to community resources:  [place]  Patient/Family's Response to care:  Pt/ daughter feel  rehab will most likely be needed at d/c.   Patient/Family's Understanding of and Emotional Response to Diagnosis, Current Treatment, and Prognosis:  Pt / daughter are aware of pt's medical status. Pt reports that he feels poorly. " I don't want to leave the hospital too soon. I don't want to have to come right back again. " Support and reassurance provided.   Emotional Assessment Appearance:  Appears stated age Attitude/Demeanor/Rapport:  Other (cooperative) Affect (typically observed):  Calm, Appropriate, Pleasant Orientation:  Oriented to Self, Oriented to Place, Oriented to  Time, Oriented to Situation Alcohol / Substance use:  Not Applicable Psych involvement (Current and /or in the community):  No (Comment)  Discharge Needs  Concerns to be addressed:  Discharge Planning Concerns Readmission within the last 30 days:  Yes Current discharge risk:  None Barriers to Discharge:      Luke, Garner  [phone] 11/10/2015, 2:50 PM

## 2015-11-10 NOTE — Clinical Social Work Placement (Signed)
   CLINICAL SOCIAL WORK PLACEMENT  NOTE  Date:  11/10/2015  Patient Details  Name: [name] MRN: [id number] Date of Birth: 1924/06/30  Clinical Social Work is seeking post-discharge placement for this patient at the [place] level of care (*CSW will initial, date and re-position this form in  chart as items are completed):      Patient/family provided with [place] Work Department's list of facilities offering this level of care within the geographic area requested by the patient (or if unable, by the patient's family).  Yes   Patient/family informed of their freedom to choose among providers that offer the needed level of care, that participate in Medicare, Medicaid or managed care program needed by the patient, have an available bed and are willing to accept the patient.  Yes   Patient/family informed of [facility]'s ownership interest in [facility] and [facility], as well as of the fact that they are under no obligation to receive care at these facilities.  PASRR submitted to EDS on       PASRR number received on       Existing PASRR number confirmed on 11/10/15     FL2 transmitted to all facilities in geographic area requested by pt/family on 11/10/15     FL2 transmitted to all facilities within larger geographic area on       Patient informed that his/her managed care company has contracts with or will negotiate with certain facilities, including the following:            Patient/family informed of bed offers received.  Patient chooses bed at       Physician recommends and patient chooses bed at      Patient to be transferred to   on  .  Patient to be transferred to facility by       Patient family notified on   of transfer.  Name of family member notified:        PHYSICIAN       Additional Comment:    _______________________________________________ [place], [place], [place] 11/10/2015, 3:24 PM

## 2015-11-10 NOTE — Progress Notes (Signed)
Pharmacy Antibiotic Note  [name] is a 82 y.o. male admitted on 11/09/2015 with pneumonia.  Pharmacy has been consulted for Aztreonam dosing.  Plan: Aztreonam 1gm iv q8hr     Temp (24hrs), Avg:98.2 F (36.8 C), Min:98 F (36.7 C), Max:98.4 F (36.9 C)   Recent Labs Lab 11/09/15 1923 11/09/15 1941  WBC 8.6  --   CREATININE 1.29*  --   LATICACIDVEN  --  2.4*    Estimated Creatinine Clearance: 41.7 mL/min (by C-G formula based on Cr of 1.29).    Allergies  Allergen Reactions  . Clopidogrel Bisulfate Hives    REACTION: red blue, black blotches  . Dilaudid [Hydromorphone Hcl] Hives    Hallucinations, little green men working on the building  . Penicillins Hives    Has patient had a PCN reaction causing immediate rash, facial/tongue/throat swelling, SOB or lightheadedness with hypotension: Yes Has patient had a PCN reaction causing severe rash involving mucus membranes or skin necrosis: No Has patient had a PCN reaction that required hospitalization No Has patient had a PCN reaction occurring within the last 10 years: No If all of the above answers are "NO", then may proceed with Cephalosporin use.   . Sulfa Antibiotics Other (See Comments)    Nightmares, hallucinations  . Sulfonamide Derivatives Other (See Comments)    Nightmares, hallucinations  . Doxycycline Hyclate Rash    Antimicrobials this admission: Vancomycin 3/28 >> Aztreonam 3/28 >>   Thank you for allowing pharmacy to be a part of this patient's care.  Nani Skillern [place] 11/10/2015 12:29 AM

## 2015-11-10 NOTE — Progress Notes (Addendum)
Pharmacy Antibiotic Note  [name] is a 82 y.o. male admitted on 11/09/2015 with HCAP.  Pt recently admitted 3/2-3/11 for ARF having to be intubated with HCAP, COPD and CHF exacerbation.  Blood culture is growing GNR.  Started on Vancomycin and Aztreonam d/t PCN allergy.   Although this patient has allergy listed to penicillin, he received Keflex in 01/2015.   Have recommended switching to a cephalosporin if possible since aztreonam is not routinely tested by microbiology.   Pharmacy has been consulted to switch Aztreonam to Cefepime.  Plan: Cefepime 1g IV q12h for CrCl~48 ml/min.  Monitor SCr closely for adjustments.  RN instructed to monitor patient for rash. Continue Vancomycin 1g IV q24h for now.  F/u for de-escalation if cultures do not grow GPC.  Height: 6' (182.9 cm) IBW/kg (Calculated) : 77.6  Temp (24hrs), Avg:98.2 F (36.8 C), Min:98 F (36.7 C), Max:98.5 F (36.9 C)   Recent Labs Lab 11/09/15 1923 11/09/15 1941 11/10/15 0508  WBC 8.6  --  10.1  CREATININE 1.29*  --  1.07  LATICACIDVEN  --  2.4* 1.4    Estimated Creatinine Clearance: 50.2 mL/min (by C-G formula based on Cr of 1.07).    Allergies  Allergen Reactions  . Clopidogrel Bisulfate Hives    REACTION: red blue, black blotches  . Dilaudid [Hydromorphone Hcl] Hives    Hallucinations, little green men working on the building  . Penicillins Hives    Has patient had a PCN reaction causing immediate rash, facial/tongue/throat swelling, SOB or lightheadedness with hypotension: Yes Has patient had a PCN reaction causing severe rash involving mucus membranes or skin necrosis: No Has patient had a PCN reaction that required hospitalization No Has patient had a PCN reaction occurring within the last 10 years: No If all of the above answers are "NO", then may proceed with Cephalosporin use.   . Sulfa Antibiotics Other (See Comments)    Nightmares, hallucinations  . Sulfonamide Derivatives Other (See Comments)   Nightmares, hallucinations  . Doxycycline Hyclate Rash    Antimicrobials this admission: 3/28 aztreonam >> 3/29 3/28 vancomycin >> 3/29 cefepime >>  Dose adjustments this admission: -  Microbiology results: 3/28 BCx: GNR 3/29 UCx: sent 3/29 Sputum: sent 3/29 strept pneumo ur ag: neg 3/29 Legionella ur ag: IP  Thank you for allowing pharmacy to be a part of this patient's care.  Hershal Coria 11/10/2015 9:22 AM

## 2015-11-10 NOTE — Progress Notes (Signed)
Patient Demographics  [name], is a 82 y.o. male, DOB - 07/25/1924, [id number]  Admit date - 11/09/2015   Admitting Physician Norval Morton, MD  Outpatient Primary MD for the patient is  Melinda Crutch, MD  LOS - 1   Chief Complaint  Patient presents with  . Pneumonia       Admission HPI/Brief narrative: 82 year old male with history of  COPD, CKD stage III, HTN, ischemic cardiomyopathy, systolic CHF last EF [id number] in 10/2015, multiple admission over the last month for respiratory failure secondary to a CPAP, COPD and CHF exacerbation, presents from a facility for complaints of cough on shortness of breath chest x-ray significant for pneumonia, as well blood culture growing gram-negative rods. Subjective:   [name] today has, No headache, No chest pain, No abdominal pain , complains of dyspnea.  Assessment & Plan    Principal Problem:   HCAP (healthcare-associated pneumonia) Active Problems:   Acute on chronic combined systolic and diastolic congestive heart failure, NYHA class 3 (HCC)   Anemia, iron deficiency   COPD (chronic obstructive pulmonary disease) (HCC)   Essential hypertension   Pressure ulcer   GERD (gastroesophageal reflux disease)  HCAP - Multiple recent hospitalizations, treated for pneumonia in the past, his x-ray with evidence of pneumonia, will continue with IV vancomycin and cefepime. - Blood culture growing gram-negative rod, source most likely urinary.  COPD with mild exacerbation - Patient with mild wheezing on physical exam, continue with nebs, home medication including Spiriva, as well continue with desonide and proton a.  Acute on chronic systolic CHF - Patient's EF 40-45% on last echocardiogram done in 10/2015. Patient reporting increased leg swelling. Physical exam reveals 2+ pitting edema bilateral lower extremities - Strict ins and outs and daily weights - TED  hose - Lasix 40 mg IV daily as tolerated - Continue aspirin  Gram-negative rod bacteremia, UTI - Follow on blood cultures, will repeat blood cultures in 24 hours, meanwhile aztreonam changed to cefepime - Follow on urine culture  Hyperglycemia:  -Suspected to be steroid-induced however patient blood glucose on arrival is 293. Previously seen have elevated hemoglobin A1c of 6.6 back in 2012  - follow on  hemoglobin A1c - Ccontinue with SSI  Iron deficiency anemia - Continue ferrous sulfate  Essential hypertension - Continue losartan, Coreg  Gerd - Protonix  Pressure ulcer - Wound care consult  Code Status: full  Family Communication: none at bedside  Disposition Plan: back to SNF when stable   Procedures  none   Consults   none   Medications  Scheduled Meds: . albuterol  2.5 mg Nebulization QID  . arformoterol  15 mcg Nebulization BID  . aspirin EC  81 mg Oral Daily  . budesonide (PULMICORT) nebulizer solution  0.5 mg Nebulization BID  . carvedilol  12.5 mg Oral BID WC  . ceFEPime (MAXIPIME) IV  1 g Intravenous Q12H  . enoxaparin (LOVENOX) injection  40 mg Subcutaneous QHS  . ferrous sulfate  325 mg Oral BID WC  . furosemide  40 mg Intravenous Daily  . guaiFENesin  600 mg Oral BID  . insulin aspart  0-9 Units Subcutaneous 6 times per day  . Linaclotide  145 mcg Oral Q breakfast  .  losartan  25 mg Oral Daily  . magic mouthwash  5 mL Oral QID  . metoCLOPramide  5 mg Oral TID AC  . pantoprazole  40 mg Oral Daily  . polyethylene glycol  17 g Oral BID  . predniSONE  60 mg Oral Q breakfast  . pregabalin  75 mg Oral BID  . senna-docusate  1 tablet Oral BID  . simvastatin  10 mg Oral q1800  . sodium chloride flush  3 mL Intravenous Q12H  . tiotropium  18 mcg Inhalation Daily  . vancomycin  1,000 mg Intravenous Q24H   Continuous Infusions:  PRN Meds:.sodium chloride, acetaminophen, albuterol, benzonatate, nitroGLYCERIN, ondansetron (ZOFRAN) IV, sodium  chloride flush  DVT Prophylaxis  Lovenox   Lab Results  Component Value Date   PLT 121* 11/10/2015    Antibiotics    Anti-infectives    Start     Dose/Rate Route Frequency Ordered Stop   11/10/15 1400  ceFEPIme (MAXIPIME) 1 g in dextrose 5 % 50 mL IVPB     1 g 100 mL/hr over 30 Minutes Intravenous Every 12 hours 11/10/15 0921     11/10/15 0600  aztreonam (AZACTAM) 1 g in dextrose 5 % 50 mL IVPB  Status:  Discontinued     1 g 100 mL/hr over 30 Minutes Intravenous 3 times per day 11/10/15 0028 11/10/15 0921   11/09/15 2115  vancomycin (VANCOCIN) IVPB 1000 mg/200 mL premix     1,000 mg 200 mL/hr over 60 Minutes Intravenous Every 24 hours 11/09/15 2108 11/17/15 2114   11/09/15 2100  aztreonam (AZACTAM) 2 g in dextrose 5 % 50 mL IVPB     2 g 100 mL/hr over 30 Minutes Intravenous  Once 11/09/15 2045 11/09/15 2147          Objective:   Filed Vitals:   11/10/15 0644 11/10/15 0650 11/10/15 0745 11/10/15 0803  BP:    144/46  Pulse:   89 80  Temp:      TempSrc:      Resp:   20   Height:      SpO2: 90% 90% 85%     Wt Readings from Last 3 Encounters:  11/09/15 75.864 kg (167 lb 4 oz)  11/05/15 75.864 kg (167 lb 4 oz)  11/02/15 79.5 kg (175 lb 4.3 oz)     Intake/Output Summary (Last 24 hours) at 11/10/15 1150 Last data filed at 11/10/15 0911  Gross per 24 hour  Intake    705 ml  Output   1600 ml  Net   -895 ml     Physical Exam  Awake Alert, Oriented X 3,  Supple Neck,No JVD, No cervical lymphadenopathy appriciated.  Symmetrical Chest wall movement, bilateral wheezing, rhonchi, RRR,No Gallops,Rubs or new Murmurs, No Parasternal Heave +ve B.Sounds, Abd Soft, No tenderness, No organomegaly appriciated, No rebound - guarding or rigidity. No Cyanosis, Clubbing + bilateral edema,   Data Review   Micro Results Recent Results (from the past 240 hour(s))  Culture, blood (routine x 2)     Status: None (Preliminary result)   Collection Time: 11/09/15  7:40 PM    Result Value Ref Range Status   Specimen Description BLOOD LEFT ANTECUBITAL  Final   Special Requests BOTTLES DRAWN AEROBIC AND ANAEROBIC 10 CC  Final   Culture  Setup Time   Final    GRAM NEGATIVE RODS ANAEROBIC BOTTLE ONLY CRITICAL RESULT CALLED TO, READ BACK BY AND VERIFIED WITH: E. CAUDLE,RN AT [place] ON [id number] BY Rhea Bleacher  Performed at [facility]    Culture PENDING  Incomplete   Report Status PENDING  Incomplete  Culture, blood (routine x 2)     Status: None (Preliminary result)   Collection Time: 11/09/15  7:44 PM  Result Value Ref Range Status   Specimen Description BLOOD RIGHT ANTECUBITAL  Final   Special Requests BOTTLES DRAWN AEROBIC AND ANAEROBIC 10 CC  Final   Culture  Setup Time   Final    GRAM NEGATIVE RODS IN BOTH AEROBIC AND ANAEROBIC BOTTLES CRITICAL RESULT CALLED TO, READ BACK BY AND VERIFIED WITH: E. CAUDLE,RN AT [id number] ON [id number] BY Rhea Bleacher    Culture   Final    GRAM NEGATIVE RODS Performed at [facility]    Report Status PENDING  Incomplete  Culture, sputum-assessment     Status: None   Collection Time: 11/10/15 12:06 AM  Result Value Ref Range Status   Specimen Description SPUTUM  Final   Special Requests NONE  Final   Sputum evaluation   Final    THIS SPECIMEN IS ACCEPTABLE. RESPIRATORY CULTURE REPORT TO FOLLOW.   Report Status 11/10/2015 FINAL  Final    Radiology Reports Ct Abdomen Pelvis Wo Contrast  10/15/2015  CLINICAL DATA:  Acute onset of generalized abdominal pain and diarrhea. Initial encounter. EXAM: CT ABDOMEN AND PELVIS WITHOUT CONTRAST TECHNIQUE: Multidetector CT imaging of the abdomen and pelvis was performed following the standard protocol without IV contrast. COMPARISON:  CT of the abdomen and pelvis performed 09/12/2012 FINDINGS: Minimal bibasilar atelectasis or scarring is noted. Diffuse coronary artery calcifications are seen. There is marked thinning of the myocardium at the ventricular apex, with associated  calcification and mild focal bulging, reflecting prior myocardial infarction. Calcification is noted at the aortic and mitral valves. Scattered calcified granulomata are noted within the liver. The liver and spleen are otherwise grossly unremarkable. The patient is status post cholecystectomy, with clips noted along the gallbladder fossa. The pancreas and adrenal glands are grossly unremarkable. A few scattered bilateral renal cysts are seen, measuring up to 3.8 cm in size. There is question of minimal associated calcification at a small right renal cyst. Nonspecific perinephric stranding is noted bilaterally. Minimal right-sided hydronephrosis is nonspecific. No definite distal obstruction is seen. Scattered vascular calcifications are seen at the renal hila. No free fluid is identified. The small bowel is unremarkable in appearance. The stomach is within normal limits. No acute vascular abnormalities are seen. Relatively diffuse calcification is noted along the abdominal aorta and its branches. There is minimal ectasia of the infrarenal abdominal aorta, without evidence of aneurysmal dilatation. The patient is status post appendectomy. The colon is largely filled with fluid, corresponding to the patient's diarrhea. The bladder is mildly distended and grossly unremarkable. A reservoir is noted at the right hemipelvis. Scattered brachytherapy seeds are noted at the prostate bed. No inguinal lymphadenopathy is seen. No acute osseous abnormalities are identified. Multilevel vacuum phenomenon is noted along the lumbar spine. There is minimal grade 1 retrolisthesis of L3 on L4, and minimal grade 1 anterolisthesis of L4 on L5, reflecting underlying facet disease. IMPRESSION: 1. Colon largely filled with fluid, corresponding to the patient's diarrhea. 2. Marked thinning of the myocardium at the ventricular apex, with associated calcification and mild focal bulging, reflecting prior myocardial infarction. 3. Diffuse  coronary artery calcifications seen. Calcification at the aortic and mitral valves. 4. Few scattered bilateral renal cysts noted. Minimal nonspecific right-sided hydronephrosis, without evidence of distal obstruction. 5. Relatively diffuse calcification along  the abdominal aorta and its branches. 6. Mild degenerative change along the lumbar spine. Electronically Signed   By: Garald Balding M.D.   On: 10/15/2015 06:13   Dg Chest 2 View  11/09/2015  CLINICAL DATA:  Shortness of breath. EXAM: CHEST  2 VIEW COMPARISON:  10/28/2015 chest radiograph. FINDINGS: Surgical clips in the medial lower right neck. Stable cardiomediastinal silhouette with normal heart size. No pneumothorax. No pleural effusion. Patchy consolidation throughout both lower lobes. No pulmonary edema. IMPRESSION: Patchy bilateral lower lobe consolidation, most suggestive of multilobar pneumonia. Recommend follow-up PA and lateral post treatment chest radiographs in 4-6 weeks. Electronically Signed   By: Ilona Sorrel M.D.   On: 11/09/2015 20:38   Dg Chest 2 View  10/25/2015  CLINICAL DATA:  Shortness of breath and cough EXAM: CHEST  2 VIEW COMPARISON:  10/24/15 FINDINGS: Cardiac shadow is stable. The lungs are well aerated bilaterally. Small effusions are again noted. No focal confluent infiltrate is seen. IMPRESSION: Small bilateral pleural effusions. Improved aeration in the bases is noted. Electronically Signed   By: Inez Catalina M.D.   On: 10/25/2015 10:07   Dg Chest 2 View  10/24/2015  CLINICAL DATA:  Productive cough for 2-3 days. EXAM: CHEST - 2 VIEW COMPARISON:  Two-view chest x-ray 09/20/2015. FINDINGS: The heart size is normal. Small bilateral pleural effusions are now present. Mild bibasilar airspace opacities are evident posteriorly. The upper lung fields are clear. Emphysematous changes are again noted. Atherosclerotic calcifications are present in the aortic arch. Surgical clips are present in the right neck and at the gallbladder  fossa. IMPRESSION: 1. New bilateral pleural effusions and posterior lower lobe airspace disease. While this may represent atelectasis, infection is not excluded. 2. Emphysema. 3. Atherosclerosis of the thoracic aorta. Electronically Signed   By: San Morelle M.D.   On: 10/24/2015 13:38   Dg Chest Port 1 View  10/28/2015  CLINICAL DATA:  COPD exacerbation.  Short of breath EXAM: PORTABLE CHEST 1 VIEW COMPARISON:  10/26/2015 FINDINGS: Endotracheal tube and NG tubes have been removed. Bilateral airspace disease in the bases left greater than right shows interval improvement. Minimal pleural effusion bilaterally. IMPRESSION: Interval improvement in bilateral airspace disease consistent with clearing edema. Endotracheal tube removed in the interval. Electronically Signed   By: Franchot Gallo M.D.   On: 10/28/2015 07:33   Dg Chest Port 1 View  10/26/2015  CLINICAL DATA:  Intubation EXAM: PORTABLE CHEST 1 VIEW COMPARISON:  Yesterday FINDINGS: New endotracheal tube with tip between the clavicular heads and carina. An orogastric tube reaches stomach at least. Diffuse interstitial opacities are improved. Kerley lines are still noted. There is asymmetric airspace disease to the left. No definitive effusion. When accounting for a skin fold on the left there is no visible pneumothorax. IMPRESSION: 1. New endotracheal and orogastric tubes are in good position. 2. Pulmonary edema with improvement since earlier. 3. Airspace disease which could be asymmetric alveolar edema or superimposed pneumonia. Electronically Signed   By: Monte Fantasia M.D.   On: 10/26/2015 02:00   Dg Chest Port 1 View  10/26/2015  CLINICAL DATA:  Acute onset of shortness of breath. Initial encounter. EXAM: PORTABLE CHEST 1 VIEW COMPARISON:  Chest radiograph performed earlier today at 9:24 a.m. FINDINGS: The lungs are hyperexpanded. The lung bases are incompletely imaged on this study. Vascular congestion is noted. Bibasilar airspace  opacities likely reflect pulmonary edema, significantly worsened from the recent prior study, though pneumonia could have a similar appearance. No definite pleural  effusion or pneumothorax is seen. The cardiomediastinal silhouette is within normal limits. No acute osseous abnormalities are seen. IMPRESSION: Lungs hypoexpanded. Vascular congestion noted. Bibasilar airspace opacities likely reflect pulmonary edema, significantly worsened from the prior study, though pneumonia could have a similar appearance. Electronically Signed   By: Garald Balding M.D.   On: 10/26/2015 00:06   Dg Abd 2 Views  10/29/2015  CLINICAL DATA:  Partial small bowel obstruction. EXAM: ABDOMEN - 2 VIEW COMPARISON:  10/26/2015 FINDINGS: Nasogastric tube no longer visualized. Gaseous dilatation of small bowel remains with some air also present throughout most of the colon. Dilatation of small bowel slightly worse compared to 3/14. Findings remain consistent with partial small bowel obstruction versus generalized ileus. Decubitus film shows no evidence of free intraperitoneal air. No abnormal calcifications identified. IMPRESSION: Slight worsening of partial small bowel obstruction versus ileus. Electronically Signed   By: Aletta Edouard M.D.   On: 10/29/2015 11:39   Dg Abd 2 Views  10/17/2015  CLINICAL DATA:  Follow-up small bowel obstruction EXAM: ABDOMEN - 2 VIEW COMPARISON:  10/16/2015 FINDINGS: Oral contrast is seen primarily throughout the large bowel with air-fluid levels throughout the large bowel. Contrast is seen into the distal sigmoid. Large bowel is relatively decompressed as compared to small bowel, with numerous distended loops of mid abdominal small bowel again identified measuring up to about 4.5 cm in diameter. Small bowel air-fluid levels are stable. There is opacity at the left lung base most consistent with atelectasis. IMPRESSION: Stable findings of small bowel obstruction Electronically Signed   By: Skipper Cliche  M.D.   On: 10/17/2015 08:52   Dg Abd Acute W/chest  10/14/2015  CLINICAL DATA:  Acute onset of generalized abdominal bloating. Initial encounter. EXAM: DG ABDOMEN ACUTE W/ 1V CHEST COMPARISON:  Chest radiograph performed 09/20/2015 FINDINGS: The lungs are well-aerated and clear. There is no evidence of focal opacification, pleural effusion or pneumothorax. The cardiomediastinal silhouette is within normal limits. There is dilatation of small bowel loops up to 5.8 cm in maximal diameter, with associated air-fluid levels, concerning for small bowel obstruction. Residual air is still seen in the colon. No free intra-abdominal air is identified on the provided upright view. Clips are noted within the right upper quadrant, reflecting prior cholecystectomy. Clips are also noted at the right side of the neck. No acute osseous abnormalities are seen; the sacroiliac joints are unremarkable in appearance. IMPRESSION: 1. Dilatation of small bowel loops up to 5.8 cm in maximal diameter, with air-fluid levels, concerning for relatively high-grade small bowel obstruction. 2. No acute cardiopulmonary process seen. These results were called by telephone at the time of interpretation on 10/14/2015 at 11:29 pm to [facility] in the [facility], who verbally acknowledged these results. Electronically Signed   By: Garald Balding M.D.   On: 10/14/2015 23:30   Dg Abd Portable 1v  10/26/2015  CLINICAL DATA:  Followup partial small bowel obstruction. EXAM: PORTABLE ABDOMEN - 1 VIEW COMPARISON:  10/20/2015 and earlier, including CT abdomen and pelvis 10/15/2015. FINDINGS: Persisting gaseous distention of multiple loops of small bowel in the mid abdomen. Gas and stool in normal caliber colon. Nasogastric tube looped in the stomach with its tip in the distal body. No suggestion of free air on the supine image. Prior cholecystectomy. Developing airspace opacities in both lung bases. IMPRESSION: 1. No significant change since yesterday in  the partial small bowel obstruction. 2. Developing pneumonia in both lung bases. Electronically Signed   By: Marcello Moores  Lawrence M.D.   On: 10/26/2015 07:58   Dg Abd Portable 1v  10/20/2015  CLINICAL DATA:  Followup small bowel obstruction. EXAM: PORTABLE ABDOMEN - 1 VIEW COMPARISON:  10/17/2015.  CT, 10/15/2015. FINDINGS: Mild dilation of small bowel is seen centrally. There is air in stool in the colon as well as some contrast in the left colon. The degree of small bowel dilation is mildly decreased from the prior exam. No new abnormalities. IMPRESSION: 1. Mild improvement in the partial small bowel obstruction with less small bowel dilation noted on the current study. Electronically Signed   By: Lajean Manes M.D.   On: 10/20/2015 11:56   Dg Abd Portable 1v  10/16/2015  CLINICAL DATA:  Small bowel obstruction. EXAM: PORTABLE ABDOMEN - 1 VIEW COMPARISON:  CT scan October 15, 2015 FINDINGS: There is contrast within the decompressed colon. Dilated loops of small bowel are seen diffusely, most prominent in the central and left abdomen. No free air, portal venous gas, or pneumatosis identified. No other acute abnormalities. IMPRESSION: Small bowel obstruction. Electronically Signed   By: Dorise Bullion III M.D   On: 10/16/2015 09:24     CBC  Recent Labs Lab 11/09/15 1923 11/10/15 0508  WBC 8.6 10.1  HGB 8.5* 8.5*  HCT 27.7* 27.5*  PLT 110* 121*  MCV 61.7* 60.6*  MCH 18.9* 18.7*  MCHC 30.7 30.9  RDW 16.7* 16.6*  LYMPHSABS 0.3* 0.8  MONOABS 0.3 0.4  EOSABS 0.0 0.1  BASOSABS 0.0 0.0    Chemistries   Recent Labs Lab 11/09/15 1923 11/10/15 0508  NA 141 143  K 3.9 3.4*  CL 104 104  CO2 28 30  GLUCOSE 293* 109*  BUN 32* 25*  CREATININE 1.29* 1.07  CALCIUM 8.4* 8.3*  AST 14*  --   ALT 16*  --   ALKPHOS 60  --   BILITOT 0.4  --    ------------------------------------------------------------------------------------------------------------------ estimated creatinine clearance is 50.2  mL/min (by C-G formula based on Cr of 1.07). ------------------------------------------------------------------------------------------------------------------ No results for input(s): HGBA1C in the last 72 hours. ------------------------------------------------------------------------------------------------------------------ No results for input(s): CHOL, HDL, LDLCALC, TRIG, CHOLHDL, LDLDIRECT in the last 72 hours. ------------------------------------------------------------------------------------------------------------------ No results for input(s): TSH, T4TOTAL, T3FREE, THYROIDAB in the last 72 hours.  Invalid input(s): FREET3 ------------------------------------------------------------------------------------------------------------------ No results for input(s): VITAMINB12, FOLATE, FERRITIN, TIBC, IRON, RETICCTPCT in the last 72 hours.  Coagulation profile No results for input(s): INR, PROTIME in the last 168 hours.  No results for input(s): DDIMER in the last 72 hours.  Cardiac Enzymes  Recent Labs Lab 11/09/15 1923  TROPONINI 0.03   ------------------------------------------------------------------------------------------------------------------ Invalid input(s): POCBNP     Time Spent in minutes   30 minutes   Drago Hammonds M.D on 11/10/2015 at 11:50 AM  Between 7am to 7pm - Pager - [phone]  After 7pm go to [email or web address] - password [facility]  [facility]   Office  [phone]

## 2015-11-10 NOTE — NC FL2 (Signed)
[place] LEVEL OF CARE SCREENING TOOL     IDENTIFICATION  Patient Name: [name] Birthdate: 07-17-1924 Sex: male Admission Date (Current Location): 11/09/2015  [facility] and [place] Number:  Herbalist and Address:  [facility],  [place] [address], [place]      Provider Number: [id number]  Attending Physician Name and Address:  Albertine Patricia, MD  Relative Name and Phone Number:       Current Level of Care: Hospital Recommended Level of Care: [place] Prior Approval Number:    Date Approved/Denied:   PASRR Number: [id number]  Discharge Plan: SNF    Current Diagnoses: Patient Active Problem List   Diagnosis Date Noted  . Pressure ulcer 11/10/2015  . GERD (gastroesophageal reflux disease) 11/10/2015  . Healthcare-associated pneumonia 11/09/2015  . Hypertensive heart disease with CHF (congestive heart failure) ([place]) 11/05/2015  . Obstructive sleep apnea 11/05/2015  . Acute respiratory failure with hypoxia ([place]) 10/29/2015  . HCAP (healthcare-associated pneumonia) 10/24/2015  . AKI (acute kidney injury) ([place]) 10/24/2015  . Ileus ([place]) 10/19/2015  . Stage III chronic kidney disease 10/19/2015  . Anemia, iron deficiency 10/19/2015  . COPD (chronic obstructive pulmonary disease) ([place]) 10/19/2015  . Essential hypertension 10/19/2015  . SBO (small bowel obstruction) ([place]) 10/15/2015  . Diarrhea 10/15/2015  . Near syncope 09/20/2015  . Acute kidney injury ([place]) 09/20/2015  . Syncope 09/20/2015  . Hereditary and idiopathic peripheral neuropathy 03/01/2015  . Abnormality of gait 03/01/2015  . Acute on chronic combined systolic and diastolic congestive heart failure, NYHA class 3 ([place])   . Acute pulmonary edema (HCC)   . Pneumonia   . E-coli UTI   . Acute respiratory failure ([place]) 01/08/2015  . Carotid stenosis 09/28/2014  . Aftercare following surgery of the circulatory system, [place] 01/27/2014  . Chronic  systolic CHF (congestive heart failure) ([place]) 11/17/2013  . Cough 11/13/2013  . Microcytic anemia 10/31/2013  . COPD GOLD III 10/30/2013  . Macular pucker 02/27/2012  . EDEMA 01/01/2009  . CAD, NATIVE VESSEL 11/06/2008    Orientation RESPIRATION BLADDER Height & Weight     Self, Time, Situation, Place  Normal Incontinent Weight:   Height:  6' (182.9 cm)  BEHAVIORAL SYMPTOMS/MOOD NEUROLOGICAL BOWEL NUTRITION STATUS  Other (Comment) (no behaviors)   Incontinent Diet (heart healthy)  AMBULATORY STATUS COMMUNICATION OF NEEDS Skin   Limited Assist Verbally Other (Comment) (Stage 2 pressure ulcer buttocks, and right leg)                       Personal Care Assistance Level of Assistance  Bathing, Feeding, Dressing Bathing Assistance: Limited assistance Feeding assistance: Independent Dressing Assistance: Limited assistance     Functional Limitations Info  Sight, Hearing, Speech Sight Info: Impaired Hearing Info: Impaired Speech Info: Adequate    SPECIAL CARE FACTORS FREQUENCY        PT Frequency: 5 x wk OT Frequency: 5 x wk            Contractures Contractures Info: Not present    Additional Factors Info  Code Status Code Status Info: Full Code             Current Medications (11/10/2015):  This is the current hospital active medication list Current Facility-Administered Medications  Medication Dose Route Frequency Provider Last Rate Last Dose  . 0.9 %  sodium chloride infusion  250 mL Intravenous PRN Norval Morton, MD      .  acetaminophen (TYLENOL) tablet 650 mg  650 mg Oral Q6H PRN Norval Morton, MD      . albuterol (PROVENTIL) (2.5 MG/3ML) 0.083% nebulizer solution 2.5 mg  2.5 mg Nebulization Q2H PRN Norval Morton, MD   2.5 mg at 11/10/15 0745  . albuterol (PROVENTIL) (2.5 MG/3ML) 0.083% nebulizer solution 2.5 mg  2.5 mg Nebulization QID Norval Morton, MD   2.5 mg at 11/10/15 1246  . arformoterol (BROVANA) nebulizer solution 15 mcg  15 mcg  Nebulization BID Norval Morton, MD   15 mcg at 11/10/15 0745  . aspirin EC tablet 81 mg  81 mg Oral Daily Norval Morton, MD   81 mg at 11/10/15 0959  . benzonatate (TESSALON) capsule 200 mg  200 mg Oral BID PRN Norval Morton, MD   200 mg at 11/10/15 0005  . budesonide (PULMICORT) nebulizer solution 0.5 mg  0.5 mg Nebulization BID Norval Morton, MD   0.5 mg at 11/10/15 0644  . carvedilol (COREG) tablet 12.5 mg  12.5 mg Oral BID WC Rondell Charmayne Sheer, MD   12.5 mg at 11/10/15 0803  . ceFEPIme (MAXIPIME) 1 g in dextrose 5 % 50 mL IVPB  1 g Intravenous Q12H Donald Prose Runyon, RPH      . enoxaparin (LOVENOX) injection 40 mg  40 mg Subcutaneous QHS Norval Morton, MD   40 mg at 11/10/15 0020  . ferrous sulfate tablet 325 mg  325 mg Oral BID WC Rondell Charmayne Sheer, MD   325 mg at 11/10/15 0805  . furosemide (LASIX) injection 40 mg  40 mg Intravenous Daily Norval Morton, MD   40 mg at 11/10/15 [id number]  . guaiFENesin (MUCINEX) 12 hr tablet 600 mg  600 mg Oral BID Norval Morton, MD   600 mg at 11/10/15 0804  . insulin aspart (novoLOG) injection 0-9 Units  0-9 Units Subcutaneous 6 times per day Norval Morton, MD   5 Units at 11/10/15 1255  . Linaclotide (LINZESS) capsule 145 mcg  145 mcg Oral Q breakfast Norval Morton, MD   145 mcg at 11/10/15 0805  . losartan (COZAAR) tablet 25 mg  25 mg Oral Daily Norval Morton, MD   25 mg at 11/10/15 0959  . magic mouthwash  5 mL Oral QID Albertine Patricia, MD   5 mL at 11/10/15 1256  . metoCLOPramide (REGLAN) tablet 5 mg  5 mg Oral TID AC Rondell Charmayne Sheer, MD   5 mg at 11/10/15 1255  . nitroGLYCERIN (NITROSTAT) SL tablet 0.4 mg  0.4 mg Sublingual Q5 min PRN Norval Morton, MD      . ondansetron (ZOFRAN) injection 4 mg  4 mg Intravenous Q6H PRN Rondell A Tamala Julian, MD      . pantoprazole (PROTONIX) EC tablet 40 mg  40 mg Oral Daily Rondell Charmayne Sheer, MD   40 mg at 11/10/15 1000  . polyethylene glycol (MIRALAX / GLYCOLAX) packet 17 g  17 g Oral BID Norval Morton, MD    17 g at 11/10/15 0018  . predniSONE (DELTASONE) tablet 60 mg  60 mg Oral Q breakfast Norval Morton, MD   60 mg at 11/10/15 0805  . pregabalin (LYRICA) capsule 75 mg  75 mg Oral BID Norval Morton, MD   75 mg at 11/10/15 1000  . senna-docusate (Senokot-S) tablet 1 tablet  1 tablet Oral BID Norval Morton, MD   1 tablet at 11/10/15 0018  .  simvastatin (ZOCOR) tablet 10 mg  10 mg Oral q1800 Rondell A Tamala Julian, MD      . sodium chloride flush (NS) 0.9 % injection 3 mL  3 mL Intravenous Q12H Rondell Charmayne Sheer, MD   3 mL at 11/10/15 1000  . sodium chloride flush (NS) 0.9 % injection 3 mL  3 mL Intravenous PRN Norval Morton, MD      . tiotropium (SPIRIVA) inhalation capsule 18 mcg  18 mcg Inhalation Daily Norval Morton, MD   18 mcg at 11/10/15 0745  . vancomycin (VANCOCIN) IVPB 1000 mg/200 mL premix  1,000 mg Intravenous Q24H Angela Adam, RPH 200 mL/hr at 11/09/15 2145 1,000 mg at 11/09/15 2145     Discharge Medications: Please see discharge summary for a list of discharge medications.  Relevant Imaging Results:  Relevant Lab Results:   Additional Information SSN; [id number]  Akeylah Hendel, Quintavious Nauss, LCSW

## 2015-11-10 NOTE — Progress Notes (Signed)
HF zone tool provided to pt. And some teaching done.  Pt needs more education. Will report to dayshift RN to complete and reinforce education. VWilliams,rn.

## 2015-11-10 NOTE — Consult Note (Signed)
WOC wound consult note Reason for Consult:pressure injuries Wound type:pressure, trauma Pressure Ulcer POA: Yes Measurement:Bilateral heels with 2cm round Stage 1 pressure injuries. Coccyx with 2, Stage 2 pressure injuries measuring 0.6cm round x 0.2cm with pink, moist wound beds.  Right LE and right posterior knee are partial thickness areas of skin loss from traumatic injury prior to admission .  LE measures <1cm x 0.5cm x 0.2cm and right posterior knee measures 1.5cm x 0.5cm x 0.2cm.  Both wounds have moist pinkw ound beds and serous exudate. Wound bed:AS described above Drainage (amount, consistency, odor) As described above Periwound:intact, dry Dressing procedure/placement/frequency:I have implemented Nursing care orders that guide topical care and repositioning.  Additionally I have provided pressure redistribution heel boots for use when in bed and a pressure redistribution chair pad for use when OOB.  [place] nursing team will not follow, but will remain available to this patient, the nursing and medical teams.  Please re-consult if needed. Thanks, Maudie Flakes, MSN, RN, [place], Arther Abbott  Pager# [phone]

## 2015-11-10 NOTE — Evaluation (Signed)
Physical Therapy Evaluation Patient Details Name: [name] MRN: [id number] DOB: Feb 18, 1924 Today's Date: 11/10/2015   History of Present Illness  82 yo male admitted with HCAP and has had multiple admission over the last month for respiratory failure secondary to a CPAP, COPD and CHF exacerbation. Hx of COPD, CAD, CVA, prostate cancer, anemia, MI, bil foot drop, neuropathy, vertigo  Clinical Impression  Pt admitted with above diagnosis. Pt currently with functional limitations due to the deficits listed below (see PT Problem List).  Pt will benefit from skilled PT to increase their independence and safety with mobility to allow discharge to the venue listed below.  Pt encouraged to mobilize and agreeable to OOB to chair today.  Pt remained on 2L O2 [place] and encouraged to use flutter.  Pt plans to return to SNF for rehab.     Follow Up Recommendations SNF    Equipment Recommendations  None recommended by PT    Recommendations for Other Services       Precautions / Restrictions Precautions Precautions: Fall Precaution Comments: bil LE AFO shoes (not seen in room this admission) Restrictions Weight Bearing Restrictions: No      Mobility  Bed Mobility Overal bed mobility: Needs Assistance Bed Mobility: Supine to Sit     Supine to sit: Min guard;HOB elevated     General bed mobility comments: increased time, HOB elevated  Transfers Overall transfer level: Needs assistance Equipment used: Rolling walker (2 wheeled) Transfers: Sit to/from [facility] Sit to Stand: Min assist Stand pivot transfers: Min assist       General transfer comment: pt only agreeable to OOB to recliner, assist for rise and controlling descent, remained on 2L O2 [place]  Ambulation/Gait                Stairs            Wheelchair Mobility    Modified Rankin (Stroke Patients Only)       Balance           Standing balance support: Bilateral upper extremity  supported;During functional activity Standing balance-Leahy Scale: Poor Standing balance comment: requires RW                             Pertinent Vitals/Pain Pain Assessment: No/denies pain    Home Living Family/patient expects to be discharged to:: Skilled nursing facility Living Arrangements: Alone Available Help at Discharge: Personal care attendant (7-7; 6 days a week) Type of Home: House Home Access: Stairs to enter   [employer] of Steps: 3 Home Layout: One level Home Equipment: Walker - 2 wheels;Walker - 4 wheels      Prior Function Level of Independence: Needs assistance   Gait / Transfers Assistance Needed: walks with RW prior to last admission, pt reports he had not ambulated at rehab yet  ADL's / Homemaking Assistance Needed: aide present [place] to assist with house hold and shopping        Hand Dominance        Extremity/Trunk Assessment   Upper Extremity Assessment: Generalized weakness           Lower Extremity Assessment: Generalized weakness (hx of neuropathy) RLE Deficits / Details: foot drop LLE Deficits / Details: foot drop     Communication   Communication: HOH  Cognition Arousal/Alertness: Awake/alert Behavior During Therapy: WFL for tasks assessed/performed Overall Cognitive Status: Within Functional Limits for tasks assessed  General Comments General comments (skin integrity, edema, etc.): pt encouraged to use flutter device in room, states he has been coughing up sputum    Exercises        Assessment/Plan    PT Assessment Patient needs continued PT services  PT Diagnosis Difficulty walking;Generalized weakness   PT Problem List Decreased strength;Decreased activity tolerance;Decreased balance;Decreased mobility;Decreased knowledge of use of DME;Cardiopulmonary status limiting activity  PT Treatment Interventions DME instruction;Gait training;Functional mobility  training;Therapeutic activities;Therapeutic exercise;Patient/family education;Balance training   PT Goals (Current goals can be found in the Care Plan section) Acute Rehab PT Goals PT Goal Formulation: With patient Time For Goal Achievement: 11/24/15 Potential to Achieve Goals: Good    Frequency Min 3X/week   Barriers to discharge        Co-evaluation               End of Session Equipment Utilized During Treatment: Gait belt;Oxygen Activity Tolerance: Patient tolerated treatment well Patient left: in chair;with call bell/phone within reach;with chair alarm set           Time: 1352-1403 PT Time Calculation (min) (ACUTE ONLY): 11 min   Charges:   PT Evaluation $PT Eval Low Complexity: 1 Procedure     PT G Codes:        Shavar Gorka,KATHrine E 11/10/2015, 3:11 PM Carmelia Bake, PT, DPT 11/10/2015 Pager: [phone]

## 2015-11-11 DIAGNOSIS — I10 Essential (primary) hypertension: Secondary | ICD-10-CM

## 2015-11-11 DIAGNOSIS — B9620 Unspecified Escherichia coli [E. coli] as the cause of diseases classified elsewhere: Secondary | ICD-10-CM

## 2015-11-11 DIAGNOSIS — R7881 Bacteremia: Secondary | ICD-10-CM

## 2015-11-11 LAB — HEMOGLOBIN A1C
HEMOGLOBIN A1C: 7.3 % — AB (ref 4.8–5.6)
Mean Plasma Glucose: 163 mg/dL

## 2015-11-11 LAB — CBC
HEMATOCRIT: 23.5 % — AB (ref 39.0–52.0)
Hemoglobin: 7.5 g/dL — ABNORMAL LOW (ref 13.0–17.0)
MCH: 19.5 pg — ABNORMAL LOW (ref 26.0–34.0)
MCHC: 31.9 g/dL (ref 30.0–36.0)
MCV: 61.2 fL — ABNORMAL LOW (ref 78.0–100.0)
Platelets: 103 10*3/uL — ABNORMAL LOW (ref 150–400)
RBC: 3.84 MIL/uL — ABNORMAL LOW (ref 4.22–5.81)
RDW: 16.4 % — AB (ref 11.5–15.5)
WBC: 8.3 10*3/uL (ref 4.0–10.5)

## 2015-11-11 LAB — GLUCOSE, CAPILLARY
GLUCOSE-CAPILLARY: 150 mg/dL — AB (ref 65–99)
GLUCOSE-CAPILLARY: 122 mg/dL — AB (ref 65–99)
Glucose-Capillary: 192 mg/dL — ABNORMAL HIGH (ref 65–99)
Glucose-Capillary: 229 mg/dL — ABNORMAL HIGH (ref 65–99)
Glucose-Capillary: 236 mg/dL — ABNORMAL HIGH (ref 65–99)
Glucose-Capillary: 227 mg/dL — ABNORMAL HIGH (ref 65–99)

## 2015-11-11 LAB — RETICULOCYTES
RBC.: 3.83 MIL/uL — ABNORMAL LOW (ref 4.22–5.81)
RETIC CT PCT: 1.3 % (ref 0.4–3.1)
Retic Count, Absolute: 49.8 10*3/uL (ref 19.0–186.0)

## 2015-11-11 LAB — IRON AND TIBC
Iron: 33 ug/dL — ABNORMAL LOW (ref 45–182)
SATURATION RATIOS: 18 % (ref 17.9–39.5)
TIBC: 186 ug/dL — AB (ref 250–450)
UIBC: 153 ug/dL

## 2015-11-11 LAB — URINE CULTURE: Culture: 60000

## 2015-11-11 LAB — LEGIONELLA PNEUMOPHILA SEROGP 1 UR AG: L. pneumophila Serogp 1 Ur Ag: NEGATIVE

## 2015-11-11 LAB — BASIC METABOLIC PANEL
Anion gap: 7 (ref 5–15)
BUN: 28 mg/dL — AB (ref 6–20)
CALCIUM: 8 mg/dL — AB (ref 8.9–10.3)
CO2: 29 mmol/L (ref 22–32)
Chloride: 101 mmol/L (ref 101–111)
Creatinine, Ser: 0.91 mg/dL (ref 0.61–1.24)
GFR calc Af Amer: >60 mL/min (ref >60)
GLUCOSE: 163 mg/dL — AB (ref 65–99)
Potassium: 3.5 mmol/L (ref 3.5–5.1)
Sodium: 137 mmol/L (ref 135–145)

## 2015-11-11 LAB — VITAMIN B12: Vitamin B-12: 581 pg/mL (ref 180–914)

## 2015-11-11 LAB — FERRITIN: FERRITIN: 122 ng/mL (ref 24–336)

## 2015-11-11 LAB — FOLATE: Folate: 6.6 ng/mL (ref >5.9)

## 2015-11-11 MED ORDER — DEXTROSE 5 % IV SOLN
1.0000 g | Freq: Three times a day (TID) | INTRAVENOUS | Status: DC
  Administered 2015-11-11 – 2015-11-12 (×3): 1 g via INTRAVENOUS
  Filled 2015-11-11 (×4): qty 1

## 2015-11-11 MED ORDER — DIPHENHYDRAMINE HCL 12.5 MG/5ML PO ELIX
12.5000 mg | ORAL_SOLUTION | Freq: Once | ORAL | Status: AC
  Administered 2015-11-11: 12.5 mg via ORAL
  Filled 2015-11-11: qty 5

## 2015-11-11 NOTE — Progress Notes (Signed)
Pharmacy Antibiotic Note  [name] is a 82 y.o. male admitted on 11/09/2015 with HCAP.  Pt recently admitted 3/2-3/11 for ARF having to be intubated with HCAP, COPD and CHF exacerbation.  Blood culture is growing GNR.  Started on Vancomycin and Aztreonam d/t PCN allergy.   Although this patient has allergy listed to penicillin, he received Keflex in 01/2015.   Have recommended switching to a cephalosporin if possible since aztreonam is not routinely tested by microbiology.   Pharmacy has been consulted to switch Aztreonam to Cefepime.  Patient tolerating so far.  SCr continued to improve today.  Plan: Adjust Cefepime to 1g IV q8h for CrCl>50 ml/min.  Monitor SCr closely for adjustments.   Continue Vancomycin 1g IV q24h for now.  F/u for de-escalation if cultures do not grow GPC.  Height: 6' (182.9 cm) IBW/kg (Calculated) : 77.6  Temp (24hrs), Avg:98.1 F (36.7 C), Min:97.9 F (36.6 C), Max:98.3 F (36.8 C)   Recent Labs Lab 11/09/15 1923 11/09/15 1941 11/10/15 0508 11/11/15 0524  WBC 8.6  --  10.1 8.3  CREATININE 1.29*  --  1.07 0.91  LATICACIDVEN  --  2.4* 1.4  --     Estimated Creatinine Clearance: 59.1 mL/min (by C-G formula based on Cr of 0.91).    Allergies  Allergen Reactions  . Clopidogrel Bisulfate Hives    REACTION: red blue, black blotches  . Dilaudid [Hydromorphone Hcl] Hives    Hallucinations, little green men working on the building  . Penicillins Hives    Has patient had a PCN reaction causing immediate rash, facial/tongue/throat swelling, SOB or lightheadedness with hypotension: Yes Has patient had a PCN reaction causing severe rash involving mucus membranes or skin necrosis: No Has patient had a PCN reaction that required hospitalization No Has patient had a PCN reaction occurring within the last 10 years: No If all of the above answers are "NO", then may proceed with Cephalosporin use.   . Sulfa Antibiotics Other (See Comments)    Nightmares, hallucinations   . Sulfonamide Derivatives Other (See Comments)    Nightmares, hallucinations  . Doxycycline Hyclate Rash    Antimicrobials this admission: 3/28 aztreonam >> 3/29 3/28 vancomycin >> 3/29 cefepime >>  Dose adjustments this admission: 3/30 increase Cefepime 1g q12h to 1g q8h for improved CrCl  Microbiology results: 3/28 BCx: GNR 3/29 UCx: sent 3/29 Sputum: sent 3/29 strept pneumo ur ag: neg 3/29 Legionella ur ag: IP  Thank you for allowing pharmacy to be a part of this patient's care.  Hershal Coria 11/11/2015 7:09 AM

## 2015-11-11 NOTE — Clinical Documentation Improvement (Addendum)
Hospitalist  (Please document query responses in the current medical record, not on the CDI BPA form.  Thank you.)   Query 1 of 2 Please document the specific type of pneumonia in the progress notes and discharge summary:  - Ecoli Pneumonia  - Other type of pneumonia  - Unable to clinically determine  Clinical Information: Sputum Culture shows Ecoli, abundant gram negative rods Antibiotics changed to Rocephin 11/12/15   Query 2 of 2 "Pressure Ulcer" is documented in the H&P and progress notes.  CMS guidelines require that the attending physician physician document the location(s) and stage(s) of pressure ulcers in their documentation, in addition to any documentation by [facility] nurses or nursing staff.  [place] nurse assessment was completed 11/10/15 by Maudie Flakes, MSN, RN  If you agree with the [place] nurse assessment, please document the location and stage of the pressure ulcers in the current medical record.  Clinical Information/Indicators: [place] nurse assessment 11/10/15 Bilateral heel ulcers Stage 1 pressure injuries Coccyx with 2, Stage 2 pressure injuries   Please exercise your independent, professional judgment when responding. A specific answer is not anticipated or expected.   Thank You, Erling Conte  RN BSN CCDS [phone] Health Information Management [place]

## 2015-11-11 NOTE — Progress Notes (Signed)
Patient Demographics  [name], is a 82 y.o. male, DOB - 06/05/1924, [id number]  Admit date - 11/09/2015   Admitting Physician Norval Morton, MD  Outpatient Primary MD for the patient is  Melinda Crutch, MD  LOS - 2   Chief Complaint  Patient presents with  . Pneumonia       Admission HPI/Brief narrative: 82 year old male with history of  COPD, CKD stage III, HTN, ischemic cardiomyopathy, systolic CHF last EF [id number] in 10/2015, multiple admission over the last month for respiratory failure secondary to a CPAP, COPD and CHF exacerbation, presents from a facility for complaints of cough on shortness of breath chest x-ray significant for pneumonia, as well blood culture growing Escherichia coli, Subjective:   [name] today has, No headache, No chest pain, No abdominal pain , dyspnea is improving, reports cough, phlegm is loosening up with using a flutter valve, encouraged to use more.  Assessment & Plan    Principal Problem:   HCAP (healthcare-associated pneumonia) Active Problems:   Acute on chronic combined systolic and diastolic congestive heart failure, NYHA class 3 (HCC)   Anemia, iron deficiency   COPD (chronic obstructive pulmonary disease) (HCC)   Essential hypertension   Pressure ulcer   GERD (gastroesophageal reflux disease)  HCAP - Multiple recent hospitalizations, treated for pneumonia in the past, his x-ray with evidence of pneumonia, will continue with IV vancomycin and cefepime. - CT chest was obtained with evidence of multilobar pneumonia, needs repeat CT chest without contrast for follow-up on resolution.  Escherichia coli bacteremia, UTI - Blood culture growing Escherichia coli, will continue with IV cefepime , will repeat blood cultures today - - Follow on urine culture  COPD with mild exacerbation - Patient with mild wheezing on physical exam, continue with nebs, home  medication including Spiriva, as well continue with desonide and Brovana.  Acute on chronic systolic CHF - Patient's EF 40-45% on last echocardiogram done in 10/2015. Patient reporting increased leg swelling. Physical exam reveals 2+ pitting edema bilateral lower extremities - Strict ins and outs and daily weights - TED hose - Continue with IV Lasix - Continue aspirin  Anemia - patient with known chronic iron deficiency anemia, will repeat anemia panel, will check Hemoccult stool.  Hyperglycemia:  -Suspected to be steroid-induced however patient blood glucose on arrival is 293. Previously seen have elevated hemoglobin A1c of 6.6 back in 2012  - follow on  hemoglobin A1c is 7.3 - continue with SSI  Pressure ulcer  - Wound care consult appreciated   Iron deficiency anemia - Continue ferrous sulfate  Essential hypertension - Continue losartan, Coreg  Gerd - Protonix    Code Status: full  Family Communication: none at bedside  Disposition Plan: back to SNF when stable   Procedures  none   Consults   none   Medications  Scheduled Meds: . albuterol  2.5 mg Nebulization QID  . arformoterol  15 mcg Nebulization BID  . aspirin EC  81 mg Oral Daily  . budesonide (PULMICORT) nebulizer solution  0.5 mg Nebulization BID  . carvedilol  12.5 mg Oral BID WC  . ceFEPime (MAXIPIME) IV  1 g Intravenous Q8H  . enoxaparin (LOVENOX) injection  40 mg Subcutaneous QHS  .  ferrous sulfate  325 mg Oral BID WC  . furosemide  40 mg Intravenous Daily  . guaiFENesin  600 mg Oral BID  . insulin aspart  0-9 Units Subcutaneous 6 times per day  . Linaclotide  145 mcg Oral Q breakfast  . losartan  25 mg Oral Daily  . magic mouthwash  5 mL Oral QID  . metoCLOPramide  5 mg Oral TID AC  . pantoprazole  40 mg Oral Daily  . polyethylene glycol  17 g Oral BID  . predniSONE  60 mg Oral Q breakfast  . pregabalin  75 mg Oral BID  . senna-docusate  1 tablet Oral BID  . simvastatin  10 mg Oral  q1800  . sodium chloride flush  3 mL Intravenous Q12H  . tiotropium  18 mcg Inhalation Daily  . vancomycin  1,000 mg Intravenous Q24H   Continuous Infusions:  PRN Meds:.sodium chloride, acetaminophen, albuterol, benzonatate, nitroGLYCERIN, ondansetron (ZOFRAN) IV, sodium chloride flush  DVT Prophylaxis  Lovenox   Lab Results  Component Value Date   PLT 103* 11/11/2015    Antibiotics    Anti-infectives    Start     Dose/Rate Route Frequency Ordered Stop   11/11/15 1000  ceFEPIme (MAXIPIME) 1 g in dextrose 5 % 50 mL IVPB     1 g 100 mL/hr over 30 Minutes Intravenous Every 8 hours 11/11/15 0709     11/10/15 1400  ceFEPIme (MAXIPIME) 1 g in dextrose 5 % 50 mL IVPB  Status:  Discontinued     1 g 100 mL/hr over 30 Minutes Intravenous Every 12 hours 11/10/15 0921 11/11/15 0709   11/10/15 0600  aztreonam (AZACTAM) 1 g in dextrose 5 % 50 mL IVPB  Status:  Discontinued     1 g 100 mL/hr over 30 Minutes Intravenous 3 times per day 11/10/15 0028 11/10/15 0921   11/09/15 2115  vancomycin (VANCOCIN) IVPB 1000 mg/200 mL premix     1,000 mg 200 mL/hr over 60 Minutes Intravenous Every 24 hours 11/09/15 2108 11/17/15 2114   11/09/15 2100  aztreonam (AZACTAM) 2 g in dextrose 5 % 50 mL IVPB     2 g 100 mL/hr over 30 Minutes Intravenous  Once 11/09/15 2045 11/09/15 2147          Objective:   Filed Vitals:   11/11/15 0443 11/11/15 0730 11/11/15 0900 11/11/15 1100  BP: 133/52  111/46   Pulse: 62 66 67 69  Temp: 97.9 F (36.6 C)  97.8 F (36.6 C)   TempSrc: Oral  Oral   Resp: 19 18 20 18   Height:      SpO2: 99% 96% 95% 96%    Wt Readings from Last 3 Encounters:  11/09/15 75.864 kg (167 lb 4 oz)  11/05/15 75.864 kg (167 lb 4 oz)  11/02/15 79.5 kg (175 lb 4.3 oz)     Intake/Output Summary (Last 24 hours) at 11/11/15 1358 Last data filed at 11/11/15 1038  Gross per 24 hour  Intake    360 ml  Output   1525 ml  Net  -1165 ml     Physical Exam  Awake Alert, Oriented X 3,   Supple Neck,No JVD, No cervical lymphadenopathy appriciated.  Symmetrical Chest wall movement, bilateral wheezing, rhonchi, RRR,No Gallops,Rubs or new Murmurs, No Parasternal Heave +ve B.Sounds, Abd Soft, No tenderness, No organomegaly appriciated, No rebound - guarding or rigidity. No Cyanosis, Clubbing + bilateral edema,   Data Review   Micro Results Recent Results (from the  past 240 hour(s))  Culture, blood (routine x 2)     Status: None (Preliminary result)   Collection Time: 11/09/15  7:40 PM  Result Value Ref Range Status   Specimen Description BLOOD LEFT ANTECUBITAL  Final   Special Requests BOTTLES DRAWN AEROBIC AND ANAEROBIC 10 CC  Final   Culture  Setup Time   Final    GRAM NEGATIVE RODS ANAEROBIC BOTTLE ONLY CRITICAL RESULT CALLED TO, READ BACK BY AND VERIFIED WITH: E. CAUDLE,RN AT [id number] ON [id number] BY Rhea Bleacher    Culture   Final    GRAM NEGATIVE RODS Performed at [facility]    Report Status PENDING  Incomplete  Culture, blood (routine x 2)     Status: None (Preliminary result)   Collection Time: 11/09/15  7:44 PM  Result Value Ref Range Status   Specimen Description BLOOD RIGHT ANTECUBITAL  Final   Special Requests BOTTLES DRAWN AEROBIC AND ANAEROBIC 10 CC  Final   Culture  Setup Time   Final    GRAM NEGATIVE RODS IN BOTH AEROBIC AND ANAEROBIC BOTTLES CRITICAL RESULT CALLED TO, READ BACK BY AND VERIFIED WITH: E. CAUDLE,RN AT [id number] ON [id number] BY Rhea Bleacher    Culture   Final    ESCHERICHIA COLI Performed at [facility]    Report Status PENDING  Incomplete  Culture, sputum-assessment     Status: None   Collection Time: 11/10/15 12:06 AM  Result Value Ref Range Status   Specimen Description SPUTUM  Final   Special Requests NONE  Final   Sputum evaluation   Final    THIS SPECIMEN IS ACCEPTABLE. RESPIRATORY CULTURE REPORT TO FOLLOW.   Report Status 11/10/2015 FINAL  Final  Culture, respiratory (NON-Expectorated)     Status: None (Preliminary  result)   Collection Time: 11/10/15 12:06 AM  Result Value Ref Range Status   Specimen Description SPUTUM  Final   Special Requests NONE  Final   Gram Stain   Final    MODERATE WBC PRESENT,BOTH PMN AND MONONUCLEAR FEW SQUAMOUS EPITHELIAL CELLS PRESENT ABUNDANT GRAM NEGATIVE RODS MODERATE GRAM POSITIVE COCCI IN PAIRS IN CLUSTERS FEW YEAST THIS SPECIMEN IS ACCEPTABLE FOR SPUTUM CULTURE Performed at [facility]    Culture   Final    ABUNDANT GRAM NEGATIVE RODS Performed at [facility]    Report Status PENDING  Incomplete    Radiology Reports Ct Abdomen Pelvis Wo Contrast  10/15/2015  CLINICAL DATA:  Acute onset of generalized abdominal pain and diarrhea. Initial encounter. EXAM: CT ABDOMEN AND PELVIS WITHOUT CONTRAST TECHNIQUE: Multidetector CT imaging of the abdomen and pelvis was performed following the standard protocol without IV contrast. COMPARISON:  CT of the abdomen and pelvis performed 09/12/2012 FINDINGS: Minimal bibasilar atelectasis or scarring is noted. Diffuse coronary artery calcifications are seen. There is marked thinning of the myocardium at the ventricular apex, with associated calcification and mild focal bulging, reflecting prior myocardial infarction. Calcification is noted at the aortic and mitral valves. Scattered calcified granulomata are noted within the liver. The liver and spleen are otherwise grossly unremarkable. The patient is status post cholecystectomy, with clips noted along the gallbladder fossa. The pancreas and adrenal glands are grossly unremarkable. A few scattered bilateral renal cysts are seen, measuring up to 3.8 cm in size. There is question of minimal associated calcification at a small right renal cyst. Nonspecific perinephric stranding is noted bilaterally. Minimal right-sided hydronephrosis is nonspecific. No definite distal obstruction is seen. Scattered vascular calcifications are  seen at the renal hila. No free fluid is  identified. The small bowel is unremarkable in appearance. The stomach is within normal limits. No acute vascular abnormalities are seen. Relatively diffuse calcification is noted along the abdominal aorta and its branches. There is minimal ectasia of the infrarenal abdominal aorta, without evidence of aneurysmal dilatation. The patient is status post appendectomy. The colon is largely filled with fluid, corresponding to the patient's diarrhea. The bladder is mildly distended and grossly unremarkable. A reservoir is noted at the right hemipelvis. Scattered brachytherapy seeds are noted at the prostate bed. No inguinal lymphadenopathy is seen. No acute osseous abnormalities are identified. Multilevel vacuum phenomenon is noted along the lumbar spine. There is minimal grade 1 retrolisthesis of L3 on L4, and minimal grade 1 anterolisthesis of L4 on L5, reflecting underlying facet disease. IMPRESSION: 1. Colon largely filled with fluid, corresponding to the patient's diarrhea. 2. Marked thinning of the myocardium at the ventricular apex, with associated calcification and mild focal bulging, reflecting prior myocardial infarction. 3. Diffuse coronary artery calcifications seen. Calcification at the aortic and mitral valves. 4. Few scattered bilateral renal cysts noted. Minimal nonspecific right-sided hydronephrosis, without evidence of distal obstruction. 5. Relatively diffuse calcification along the abdominal aorta and its branches. 6. Mild degenerative change along the lumbar spine. Electronically Signed   By: Garald Balding M.D.   On: 10/15/2015 06:13   Dg Chest 2 View  11/09/2015  CLINICAL DATA:  Shortness of breath. EXAM: CHEST  2 VIEW COMPARISON:  10/28/2015 chest radiograph. FINDINGS: Surgical clips in the medial lower right neck. Stable cardiomediastinal silhouette with normal heart size. No pneumothorax. No pleural effusion. Patchy consolidation throughout both lower lobes. No pulmonary edema. IMPRESSION:  Patchy bilateral lower lobe consolidation, most suggestive of multilobar pneumonia. Recommend follow-up PA and lateral post treatment chest radiographs in 4-6 weeks. Electronically Signed   By: Ilona Sorrel M.D.   On: 11/09/2015 20:38   Dg Chest 2 View  10/25/2015  CLINICAL DATA:  Shortness of breath and cough EXAM: CHEST  2 VIEW COMPARISON:  10/24/15 FINDINGS: Cardiac shadow is stable. The lungs are well aerated bilaterally. Small effusions are again noted. No focal confluent infiltrate is seen. IMPRESSION: Small bilateral pleural effusions. Improved aeration in the bases is noted. Electronically Signed   By: Inez Catalina M.D.   On: 10/25/2015 10:07   Dg Chest 2 View  10/24/2015  CLINICAL DATA:  Productive cough for 2-3 days. EXAM: CHEST - 2 VIEW COMPARISON:  Two-view chest x-ray 09/20/2015. FINDINGS: The heart size is normal. Small bilateral pleural effusions are now present. Mild bibasilar airspace opacities are evident posteriorly. The upper lung fields are clear. Emphysematous changes are again noted. Atherosclerotic calcifications are present in the aortic arch. Surgical clips are present in the right neck and at the gallbladder fossa. IMPRESSION: 1. New bilateral pleural effusions and posterior lower lobe airspace disease. While this may represent atelectasis, infection is not excluded. 2. Emphysema. 3. Atherosclerosis of the thoracic aorta. Electronically Signed   By: San Morelle M.D.   On: 10/24/2015 13:38   Ct Chest Wo Contrast  11/10/2015  CLINICAL DATA:  Shortness of breath and cough EXAM: CT CHEST WITHOUT CONTRAST TECHNIQUE: Multidetector CT imaging of the chest was performed following the standard protocol without IV contrast. COMPARISON:  11/09/2015 FINDINGS: The lungs are well aerated bilaterally. Patchy infiltrates are seen bilaterally worst in the right middle and right lower lobes consistent with acute pneumonia. Some scattered more nodular appearing infiltrates are seen  as well.  One of these nodular areas measures approximately 6 mm in the right upper lobe best seen on image number 30 of series 5. Second somewhat nodular area is noted on the left measuring 12 mm is in dimension. This is best seen on image number 27 of series 5. A small right-sided pleural effusion is seen. The thoracic inlet is within normal limits. Aortic calcifications are noted. No significant hilar or mediastinal adenopathy is seen. Some calcification in the left ventricular wall is noted likely related to prior infarct. The visualized upper abdomen shows changes of prior cholecystectomy. A left renal cyst is noted as well. Degenerative changes of the thoracic spine are noted. IMPRESSION: Infiltrative changes in both lungs worst in the right middle and right lower lobes. Some nodularity is noted in scattered areas throughout both lungs. This all again likely represents an acute pneumonic process. Short-term followup following appropriate therapy with noncontrast CT of the chest is recommended to assess for resolution or stability. This could be performed in 4-6 weeks Electronically Signed   By: Inez Catalina M.D.   On: 11/10/2015 12:44   Dg Chest Port 1 View  10/28/2015  CLINICAL DATA:  COPD exacerbation.  Short of breath EXAM: PORTABLE CHEST 1 VIEW COMPARISON:  10/26/2015 FINDINGS: Endotracheal tube and NG tubes have been removed. Bilateral airspace disease in the bases left greater than right shows interval improvement. Minimal pleural effusion bilaterally. IMPRESSION: Interval improvement in bilateral airspace disease consistent with clearing edema. Endotracheal tube removed in the interval. Electronically Signed   By: Franchot Gallo M.D.   On: 10/28/2015 07:33   Dg Chest Port 1 View  10/26/2015  CLINICAL DATA:  Intubation EXAM: PORTABLE CHEST 1 VIEW COMPARISON:  Yesterday FINDINGS: New endotracheal tube with tip between the clavicular heads and carina. An orogastric tube reaches stomach at least. Diffuse  interstitial opacities are improved. Kerley lines are still noted. There is asymmetric airspace disease to the left. No definitive effusion. When accounting for a skin fold on the left there is no visible pneumothorax. IMPRESSION: 1. New endotracheal and orogastric tubes are in good position. 2. Pulmonary edema with improvement since earlier. 3. Airspace disease which could be asymmetric alveolar edema or superimposed pneumonia. Electronically Signed   By: Monte Fantasia M.D.   On: 10/26/2015 02:00   Dg Chest Port 1 View  10/26/2015  CLINICAL DATA:  Acute onset of shortness of breath. Initial encounter. EXAM: PORTABLE CHEST 1 VIEW COMPARISON:  Chest radiograph performed earlier today at 9:24 a.m. FINDINGS: The lungs are hyperexpanded. The lung bases are incompletely imaged on this study. Vascular congestion is noted. Bibasilar airspace opacities likely reflect pulmonary edema, significantly worsened from the recent prior study, though pneumonia could have a similar appearance. No definite pleural effusion or pneumothorax is seen. The cardiomediastinal silhouette is within normal limits. No acute osseous abnormalities are seen. IMPRESSION: Lungs hypoexpanded. Vascular congestion noted. Bibasilar airspace opacities likely reflect pulmonary edema, significantly worsened from the prior study, though pneumonia could have a similar appearance. Electronically Signed   By: Garald Balding M.D.   On: 10/26/2015 00:06   Dg Abd 2 Views  10/29/2015  CLINICAL DATA:  Partial small bowel obstruction. EXAM: ABDOMEN - 2 VIEW COMPARISON:  10/26/2015 FINDINGS: Nasogastric tube no longer visualized. Gaseous dilatation of small bowel remains with some air also present throughout most of the colon. Dilatation of small bowel slightly worse compared to 3/14. Findings remain consistent with partial small bowel obstruction versus generalized ileus. Decubitus film  shows no evidence of free intraperitoneal air. No abnormal calcifications  identified. IMPRESSION: Slight worsening of partial small bowel obstruction versus ileus. Electronically Signed   By: Aletta Edouard M.D.   On: 10/29/2015 11:39   Dg Abd 2 Views  10/17/2015  CLINICAL DATA:  Follow-up small bowel obstruction EXAM: ABDOMEN - 2 VIEW COMPARISON:  10/16/2015 FINDINGS: Oral contrast is seen primarily throughout the large bowel with air-fluid levels throughout the large bowel. Contrast is seen into the distal sigmoid. Large bowel is relatively decompressed as compared to small bowel, with numerous distended loops of mid abdominal small bowel again identified measuring up to about 4.5 cm in diameter. Small bowel air-fluid levels are stable. There is opacity at the left lung base most consistent with atelectasis. IMPRESSION: Stable findings of small bowel obstruction Electronically Signed   By: Skipper Cliche M.D.   On: 10/17/2015 08:52   Dg Abd Acute W/chest  10/14/2015  CLINICAL DATA:  Acute onset of generalized abdominal bloating. Initial encounter. EXAM: DG ABDOMEN ACUTE W/ 1V CHEST COMPARISON:  Chest radiograph performed 09/20/2015 FINDINGS: The lungs are well-aerated and clear. There is no evidence of focal opacification, pleural effusion or pneumothorax. The cardiomediastinal silhouette is within normal limits. There is dilatation of small bowel loops up to 5.8 cm in maximal diameter, with associated air-fluid levels, concerning for small bowel obstruction. Residual air is still seen in the colon. No free intra-abdominal air is identified on the provided upright view. Clips are noted within the right upper quadrant, reflecting prior cholecystectomy. Clips are also noted at the right side of the neck. No acute osseous abnormalities are seen; the sacroiliac joints are unremarkable in appearance. IMPRESSION: 1. Dilatation of small bowel loops up to 5.8 cm in maximal diameter, with air-fluid levels, concerning for relatively high-grade small bowel obstruction. 2. No acute  cardiopulmonary process seen. These results were called by telephone at the time of interpretation on 10/14/2015 at 11:29 pm to [facility] in the [facility] ER, who verbally acknowledged these results. Electronically Signed   By: Garald Balding M.D.   On: 10/14/2015 23:30   Dg Abd Portable 1v  10/26/2015  CLINICAL DATA:  Followup partial small bowel obstruction. EXAM: PORTABLE ABDOMEN - 1 VIEW COMPARISON:  10/20/2015 and earlier, including CT abdomen and pelvis 10/15/2015. FINDINGS: Persisting gaseous distention of multiple loops of small bowel in the mid abdomen. Gas and stool in normal caliber colon. Nasogastric tube looped in the stomach with its tip in the distal body. No suggestion of free air on the supine image. Prior cholecystectomy. Developing airspace opacities in both lung bases. IMPRESSION: 1. No significant change since yesterday in the partial small bowel obstruction. 2. Developing pneumonia in both lung bases. Electronically Signed   By: Evangeline Dakin M.D.   On: 10/26/2015 07:58   Dg Abd Portable 1v  10/20/2015  CLINICAL DATA:  Followup small bowel obstruction. EXAM: PORTABLE ABDOMEN - 1 VIEW COMPARISON:  10/17/2015.  CT, 10/15/2015. FINDINGS: Mild dilation of small bowel is seen centrally. There is air in stool in the colon as well as some contrast in the left colon. The degree of small bowel dilation is mildly decreased from the prior exam. No new abnormalities. IMPRESSION: 1. Mild improvement in the partial small bowel obstruction with less small bowel dilation noted on the current study. Electronically Signed   By: Lajean Manes M.D.   On: 10/20/2015 11:56   Dg Abd Portable 1v  10/16/2015  CLINICAL DATA:  Small bowel obstruction.  EXAM: PORTABLE ABDOMEN - 1 VIEW COMPARISON:  CT scan October 15, 2015 FINDINGS: There is contrast within the decompressed colon. Dilated loops of small bowel are seen diffusely, most prominent in the central and left abdomen. No free air, portal venous gas, or  pneumatosis identified. No other acute abnormalities. IMPRESSION: Small bowel obstruction. Electronically Signed   By: Dorise Bullion III M.D   On: 10/16/2015 09:24     CBC  Recent Labs Lab 11/09/15 1923 11/10/15 0508 11/11/15 0524  WBC 8.6 10.1 8.3  HGB 8.5* 8.5* 7.5*  HCT 27.7* 27.5* 23.5*  PLT 110* 121* 103*  MCV 61.7* 60.6* 61.2*  MCH 18.9* 18.7* 19.5*  MCHC 30.7 30.9 31.9  RDW 16.7* 16.6* 16.4*  LYMPHSABS 0.3* 0.8  --   MONOABS 0.3 0.4  --   EOSABS 0.0 0.1  --   BASOSABS 0.0 0.0  --     Chemistries   Recent Labs Lab 11/09/15 1923 11/10/15 0508 11/11/15 0524  NA 141 143 137  K 3.9 3.4* 3.5  CL 104 104 101  CO2 28 30 29   GLUCOSE 293* 109* 163*  BUN 32* 25* 28*  CREATININE 1.29* 1.07 0.91  CALCIUM 8.4* 8.3* 8.0*  AST 14*  --   --   ALT 16*  --   --   ALKPHOS 60  --   --   BILITOT 0.4  --   --    ------------------------------------------------------------------------------------------------------------------ estimated creatinine clearance is 59.1 mL/min (by C-G formula based on Cr of 0.91). ------------------------------------------------------------------------------------------------------------------  Recent Labs  11/10/15 0508  HGBA1C 7.3*   ------------------------------------------------------------------------------------------------------------------ No results for input(s): CHOL, HDL, LDLCALC, TRIG, CHOLHDL, LDLDIRECT in the last 72 hours. ------------------------------------------------------------------------------------------------------------------ No results for input(s): TSH, T4TOTAL, T3FREE, THYROIDAB in the last 72 hours.  Invalid input(s): FREET3 ------------------------------------------------------------------------------------------------------------------ No results for input(s): VITAMINB12, FOLATE, FERRITIN, TIBC, IRON, RETICCTPCT in the last 72 hours.  Coagulation profile No results for input(s): INR, PROTIME in the last 168  hours.  No results for input(s): DDIMER in the last 72 hours.  Cardiac Enzymes  Recent Labs Lab 11/09/15 1923  TROPONINI 0.03   ------------------------------------------------------------------------------------------------------------------ Invalid input(s): POCBNP     Time Spent in minutes   30 minutes   ELGERGAWY, DAWOOD M.D on 11/11/2015 at 1:58 PM  Between 7am to 7pm - Pager - [phone]  After 7pm go to [email or web address] - password [facility]  [facility]   Office  [phone]

## 2015-11-12 LAB — CBC
HEMATOCRIT: 23.4 % — AB (ref 39.0–52.0)
Hemoglobin: 7.4 g/dL — ABNORMAL LOW (ref 13.0–17.0)
MCH: 19.2 pg — ABNORMAL LOW (ref 26.0–34.0)
MCHC: 31.6 g/dL (ref 30.0–36.0)
MCV: 60.6 fL — ABNORMAL LOW (ref 78.0–100.0)
PLATELETS: 92 10*3/uL — AB (ref 150–400)
RBC: 3.86 MIL/uL — ABNORMAL LOW (ref 4.22–5.81)
RDW: 16.2 % — AB (ref 11.5–15.5)
WBC: 8.9 10*3/uL (ref 4.0–10.5)

## 2015-11-12 LAB — BASIC METABOLIC PANEL
ANION GAP: 8 (ref 5–15)
BUN: 32 mg/dL — ABNORMAL HIGH (ref 6–20)
CALCIUM: 8.2 mg/dL — AB (ref 8.9–10.3)
CO2: 29 mmol/L (ref 22–32)
Chloride: 102 mmol/L (ref 101–111)
Creatinine, Ser: 1.38 mg/dL — ABNORMAL HIGH (ref 0.61–1.24)
GFR, EST AFRICAN AMERICAN: 51 mL/min — AB (ref >60)
GFR, EST NON AFRICAN AMERICAN: 44 mL/min — AB (ref >60)
Glucose, Bld: 157 mg/dL — ABNORMAL HIGH (ref 65–99)
Potassium: 3.7 mmol/L (ref 3.5–5.1)
SODIUM: 139 mmol/L (ref 135–145)

## 2015-11-12 LAB — CULTURE, RESPIRATORY W GRAM STAIN

## 2015-11-12 LAB — GLUCOSE, CAPILLARY
GLUCOSE-CAPILLARY: 105 mg/dL — AB (ref 65–99)
GLUCOSE-CAPILLARY: 193 mg/dL — AB (ref 65–99)
Glucose-Capillary: 159 mg/dL — ABNORMAL HIGH (ref 65–99)
Glucose-Capillary: 266 mg/dL — ABNORMAL HIGH (ref 65–99)
Glucose-Capillary: 249 mg/dL — ABNORMAL HIGH (ref 65–99)

## 2015-11-12 LAB — OCCULT BLOOD X 1 CARD TO LAB, STOOL: Fecal Occult Bld: POSITIVE — AB

## 2015-11-12 LAB — CULTURE, BLOOD (ROUTINE X 2)

## 2015-11-12 LAB — CULTURE, RESPIRATORY

## 2015-11-12 MED ORDER — SODIUM CHLORIDE 0.9 % IV SOLN
510.0000 mg | Freq: Once | INTRAVENOUS | Status: AC
  Administered 2015-11-12: 510 mg via INTRAVENOUS
  Filled 2015-11-12: qty 17

## 2015-11-12 MED ORDER — DEXTROSE 5 % IV SOLN
2.0000 g | Freq: Every day | INTRAVENOUS | Status: DC
  Administered 2015-11-12 – 2015-11-15 (×4): 2 g via INTRAVENOUS
  Filled 2015-11-12 (×4): qty 2

## 2015-11-12 MED ORDER — LORAZEPAM 0.5 MG PO TABS
0.5000 mg | ORAL_TABLET | Freq: Once | ORAL | Status: AC
  Administered 2015-11-12: 0.5 mg via ORAL
  Filled 2015-11-12: qty 1

## 2015-11-12 MED ORDER — RISAQUAD PO CAPS
2.0000 | ORAL_CAPSULE | Freq: Every day | ORAL | Status: DC
  Administered 2015-11-12 – 2015-11-15 (×4): 2 via ORAL
  Filled 2015-11-12 (×4): qty 2

## 2015-11-12 NOTE — Progress Notes (Signed)
Physical Therapy Treatment Patient Details Name: [name] MRN: [id number] DOB: 04-26-1924 Today's Date: 11/12/2015    History of Present Illness 82 yo male admitted with HCAP and has had multiple admission over the last month for respiratory failure secondary to a CPAP, COPD and CHF exacerbation. Hx of COPD, CAD, CVA, prostate cancer, anemia, MI, bil foot drop, neuropathy, vertigo    PT Comments    The patient was assisted to [facility], large, loose BM. The patient is very pleasant. Will attempt ambulation next visit.  Plans to return to SNF.  Follow Up Recommendations  SNF     Equipment Recommendations  None recommended by PT    Recommendations for Other Services       Precautions / Restrictions Precautions Precautions: Fall Precaution Comments: bil LE AFO shoes (not seen in room this admission), O@    Mobility  Bed Mobility Overal bed mobility: Needs Assistance       Supine to sit: Min guard     General bed mobility comments: increased time, HOB elevated  Transfers Overall transfer level: Needs assistance Equipment used: Rolling walker (2 wheeled) Transfers: Sit to/from [facility] Sit to Stand: Mod assist Stand pivot transfers: Mod assist       General transfer comment: pivot to [facility] without Rw mod Assist,  Ambulation/Gait Ambulation/Gait assistance: Mod assist Ambulation Distance (Feet): 5 Feet Assistive device: Rolling walker (2 wheeled) Gait Pattern/deviations: Step-through pattern     General Gait Details: slow gait with limited activity tolerance.  Noted cough/congestion.  No AFO's present.     Stairs            Wheelchair Mobility    Modified Rankin (Stroke Patients Only)       Balance           Standing balance support: Bilateral upper extremity supported;During functional activity Standing balance-Leahy Scale: Poor Standing balance comment: fair standing  with bilateral handhold onto RW for pericare                    Cognition Arousal/Alertness: Awake/alert                          Exercises      General Comments        Pertinent Vitals/Pain Pain Assessment: No/denies pain    Home Living                      Prior Function            PT Goals (current goals can now be found in the care plan section) Progress towards PT goals: Progressing toward goals    Frequency  Min 3X/week    PT Plan Current plan remains appropriate    Co-evaluation             End of Session Equipment Utilized During Treatment: Gait belt;Oxygen Activity Tolerance: Patient tolerated treatment well Patient left: in chair;with call bell/phone within reach;with chair alarm set     Time: 1130-1158 PT Time Calculation (min) (ACUTE ONLY): 28 min  Charges:  $Therapeutic Activity: 8-22 mins $Self Care/Home Management: 8-22                    G Codes:      Marcelino Freestone PT [id number]  11/12/2015, 12:06 PM

## 2015-11-12 NOTE — Care Management Important Message (Signed)
Important Message  Patient Details  Name: [name] MRN: [id number] Date of Birth: 03/17/1924   Medicare Important Message Given:  Yes    Camillo Flaming 11/12/2015, 9:21 AMImportant Message  Patient Details  Name: [name] MRN: [id number] Date of Birth: [birth year]   Medicare Important Message Given:  Yes    Camillo Flaming 11/12/2015, 9:20 AM

## 2015-11-12 NOTE — Progress Notes (Signed)
Patient Demographics  [name], is a 82 y.o. male, DOB - 1924/06/12, [id number]  Admit date - 11/09/2015   Admitting Physician Norval Morton, MD  Outpatient Primary MD for the patient is  Melinda Crutch, MD  LOS - 3   Chief Complaint  Patient presents with  . Pneumonia       Admission HPI/Brief narrative: 82 year old male with history of  COPD, CKD stage III, HTN, ischemic cardiomyopathy, systolic CHF last EF [id number] in 10/2015, multiple admission over the last month for respiratory failure secondary to a CPAP, COPD and CHF exacerbation, presents from a facility for complaints of cough on shortness of breath chest x-ray significant for pneumonia, as well blood culture growing Escherichia coli, Subjective:   [name] today has, No headache, No chest pain, No abdominal pain , dyspnea is improving, reports cough, phlegm is loosening up with using a flutter valve, encouraged to use more.  Assessment & Plan    Principal Problem:   HCAP (healthcare-associated pneumonia) Active Problems:   Acute on chronic combined systolic and diastolic congestive heart failure, NYHA class 3 (HCC)   Anemia, iron deficiency   COPD (chronic obstructive pulmonary disease) (HCC)   Essential hypertension   Pressure ulcer   GERD (gastroesophageal reflux disease)   E coli bacteremia  HCAP - Multiple recent hospitalizations, treated for pneumonia in the past, his x-ray with evidence of pneumonia,Treated initially with IV vancomycin and Zosyn, sputum culture growing Escherichia coli, will transition to IV Rocephin. - CT chest was obtained with evidence of multilobar pneumonia, needs repeat CT chest without contrast for follow-up on resolution.  Escherichia coli bacteremia, UTI - Blood culture growing Escherichia coli, initially on IV cefepime , narrowed to IV Rocephin 3/31 given sensitivities. - Follow on urine culture -  Follow on the repeat blood cultures  COPD with mild exacerbation - Patient with mild wheezing on physical exam, continue with nebs, home medication including Spiriva, as well continue with desonide and Brovana.  Acute on chronic systolic CHF - Patient's EF 40-45% on last echocardiogram done in 10/2015. Patient reporting increased leg swelling. Physical exam reveals 2+ pitting edema bilateral lower extremities - Strict ins and outs and daily weights - TED hose - Opinion IV diuresis and given worsening renal function - Continue aspirin  Anemia of iron deficiency and chronic disease - patient with known chronic iron deficiency anemia, and chronic illness, baseline hemoglobin 7.5-8.5 over last few weeks, he is Hemoccult positive, most likely related to iron supplements, as no evidence of bleed has has normal color stools and his hemoglobin remained stable. - Discussed with [facility], endoscopy 2009 with no acute finding, colonoscopy 2005 with benign polyps, at this point patient is not a candidate for any invasive workup giving his multiple comorbidities. - Continue with iron supplement, will give one-time IV Feraheme.  Hyperglycemia:  -Suspected to be steroid-induced however patient blood glucose on arrival is 293. Previously seen have elevated hemoglobin A1c of 6.6 back in 2012  - follow on  hemoglobin A1c is 7.3 - continue with SSI  Pressure ulcer  - Wound care consult appreciated   Essential hypertension - Continue losartan, Coreg  Gerd - Protonix    Code Status: full  Family Communication: none at  bedside  Disposition Plan: back to SNF when stable   Procedures  none   Consults   none   Medications  Scheduled Meds: . albuterol  2.5 mg Nebulization QID  . arformoterol  15 mcg Nebulization BID  . aspirin EC  81 mg Oral Daily  . budesonide (PULMICORT) nebulizer solution  0.5 mg Nebulization BID  . carvedilol  12.5 mg Oral BID WC  . cefTRIAXone (ROCEPHIN)  IV  2 g  Intravenous Daily  . enoxaparin (LOVENOX) injection  40 mg Subcutaneous QHS  . ferrous sulfate  325 mg Oral BID WC  . ferumoxytol  510 mg Intravenous Once  . furosemide  40 mg Intravenous Daily  . guaiFENesin  600 mg Oral BID  . insulin aspart  0-9 Units Subcutaneous 6 times per day  . Linaclotide  145 mcg Oral Q breakfast  . losartan  25 mg Oral Daily  . magic mouthwash  5 mL Oral QID  . metoCLOPramide  5 mg Oral TID AC  . pantoprazole  40 mg Oral Daily  . polyethylene glycol  17 g Oral BID  . predniSONE  60 mg Oral Q breakfast  . pregabalin  75 mg Oral BID  . senna-docusate  1 tablet Oral BID  . simvastatin  10 mg Oral q1800  . sodium chloride flush  3 mL Intravenous Q12H  . tiotropium  18 mcg Inhalation Daily   Continuous Infusions:  PRN Meds:.sodium chloride, acetaminophen, albuterol, benzonatate, nitroGLYCERIN, ondansetron (ZOFRAN) IV, sodium chloride flush  DVT Prophylaxis  Lovenox   Lab Results  Component Value Date   PLT 92* 11/12/2015    Antibiotics    Anti-infectives    Start     Dose/Rate Route Frequency Ordered Stop   11/12/15 1100  cefTRIAXone (ROCEPHIN) 2 g in dextrose 5 % 50 mL IVPB     2 g 100 mL/hr over 30 Minutes Intravenous Daily 11/12/15 0934     11/11/15 1000  ceFEPIme (MAXIPIME) 1 g in dextrose 5 % 50 mL IVPB  Status:  Discontinued     1 g 100 mL/hr over 30 Minutes Intravenous Every 8 hours 11/11/15 0709 11/12/15 0934   11/10/15 1400  ceFEPIme (MAXIPIME) 1 g in dextrose 5 % 50 mL IVPB  Status:  Discontinued     1 g 100 mL/hr over 30 Minutes Intravenous Every 12 hours 11/10/15 0921 11/11/15 0709   11/10/15 0600  aztreonam (AZACTAM) 1 g in dextrose 5 % 50 mL IVPB  Status:  Discontinued     1 g 100 mL/hr over 30 Minutes Intravenous 3 times per day 11/10/15 0028 11/10/15 0921   11/09/15 2115  vancomycin (VANCOCIN) IVPB 1000 mg/200 mL premix  Status:  Discontinued     1,000 mg 200 mL/hr over 60 Minutes Intravenous Every 24 hours 11/09/15 2108 11/12/15  0934   11/09/15 2100  aztreonam (AZACTAM) 2 g in dextrose 5 % 50 mL IVPB     2 g 100 mL/hr over 30 Minutes Intravenous  Once 11/09/15 2045 11/09/15 2147          Objective:   Filed Vitals:   11/12/15 0434 11/12/15 0830 11/12/15 1019 11/12/15 1428  BP: 114/55 118/56    Pulse: 66 66    Temp: 97.9 F (36.6 C) 97.9 F (36.6 C)    TempSrc: Oral Oral    Resp: 18     Height:      SpO2: 98%  96% 95%    Wt Readings from Last 3 Encounters:  11/09/15 75.864 kg (167 lb 4 oz)  11/05/15 75.864 kg (167 lb 4 oz)  11/02/15 79.5 kg (175 lb 4.3 oz)     Intake/Output Summary (Last 24 hours) at 11/12/15 1446 Last data filed at 11/12/15 0859  Gross per 24 hour  Intake    990 ml  Output    725 ml  Net    265 ml     Physical Exam  Awake Alert, Oriented X 3,  Supple Neck,No JVD, No cervical lymphadenopathy appriciated.  Symmetrical Chest wall movement, bilateral wheezing, rhonchi, RRR,No Gallops,Rubs or new Murmurs, No Parasternal Heave +ve B.Sounds, Abd Soft, No tenderness, No organomegaly appriciated, No rebound - guarding or rigidity. No Cyanosis, Clubbing + bilateral edema,   Data Review   Micro Results Recent Results (from the past 240 hour(s))  Culture, blood (routine x 2)     Status: None   Collection Time: 11/09/15  7:40 PM  Result Value Ref Range Status   Specimen Description BLOOD LEFT ANTECUBITAL  Final   Special Requests BOTTLES DRAWN AEROBIC AND ANAEROBIC 10 CC  Final   Culture  Setup Time   Final    GRAM NEGATIVE RODS ANAEROBIC BOTTLE ONLY CRITICAL RESULT CALLED TO, READ BACK BY AND VERIFIED WITH: E. CAUDLE,RN AT [id number] ON [id number] BY S. YARBROUGH    Culture   Final    ESCHERICHIA COLI SUSCEPTIBILITIES PERFORMED ON PREVIOUS CULTURE WITHIN THE LAST 5 DAYS. Performed at [facility]    Report Status 11/12/2015 FINAL  Final  Culture, blood (routine x 2)     Status: None   Collection Time: 11/09/15  7:44 PM  Result Value Ref Range Status   Specimen  Description BLOOD RIGHT ANTECUBITAL  Final   Special Requests BOTTLES DRAWN AEROBIC AND ANAEROBIC 10 CC  Final   Culture  Setup Time   Final    GRAM NEGATIVE RODS IN BOTH AEROBIC AND ANAEROBIC BOTTLES CRITICAL RESULT CALLED TO, READ BACK BY AND VERIFIED WITH: E. CAUDLE,RN AT [id number] ON [id number] BY Rhea Bleacher    Culture   Final    ESCHERICHIA COLI Performed at [facility]    Report Status 11/12/2015 FINAL  Final   Organism ID, Bacteria ESCHERICHIA COLI  Final      Susceptibility   Escherichia coli - MIC*    AMPICILLIN >=32 RESISTANT Resistant     CEFAZOLIN <=4 SENSITIVE Sensitive     CEFEPIME <=1 SENSITIVE Sensitive     CEFTAZIDIME <=1 SENSITIVE Sensitive     CEFTRIAXONE <=1 SENSITIVE Sensitive     CIPROFLOXACIN 1 SENSITIVE Sensitive     GENTAMICIN <=1 SENSITIVE Sensitive     IMIPENEM <=0.25 SENSITIVE Sensitive     TRIMETH/SULFA >=320 RESISTANT Resistant     AMPICILLIN/SULBACTAM 16 INTERMEDIATE Intermediate     PIP/TAZO <=4 SENSITIVE Sensitive     * ESCHERICHIA COLI  Urine culture     Status: None   Collection Time: 11/10/15 12:06 AM  Result Value Ref Range Status   Specimen Description URINE, CLEAN CATCH  Final   Special Requests NONE  Final   Culture   Final    60,000 COLONIES/ml YEAST Performed at [facility]    Report Status 11/11/2015 FINAL  Final  Culture, sputum-assessment     Status: None   Collection Time: 11/10/15 12:06 AM  Result Value Ref Range Status   Specimen Description SPUTUM  Final   Special Requests NONE  Final   Sputum evaluation   Final  THIS SPECIMEN IS ACCEPTABLE. RESPIRATORY CULTURE REPORT TO FOLLOW.   Report Status 11/10/2015 FINAL  Final  Culture, respiratory (NON-Expectorated)     Status: None   Collection Time: 11/10/15 12:06 AM  Result Value Ref Range Status   Specimen Description SPUTUM  Final   Special Requests NONE  Final   Gram Stain   Final    MODERATE WBC PRESENT,BOTH PMN AND MONONUCLEAR FEW SQUAMOUS EPITHELIAL CELLS  PRESENT ABUNDANT GRAM NEGATIVE RODS MODERATE GRAM POSITIVE COCCI IN PAIRS IN CLUSTERS FEW YEAST THIS SPECIMEN IS ACCEPTABLE FOR SPUTUM CULTURE Performed at [facility]    Culture   Final    ABUNDANT ESCHERICHIA COLI Performed at [facility]    Report Status 11/12/2015 FINAL  Final   Organism ID, Bacteria ESCHERICHIA COLI  Final      Susceptibility   Escherichia coli - MIC*    AMPICILLIN >=32 RESISTANT Resistant     AMPICILLIN/SULBACTAM 16 INTERMEDIATE Intermediate     CEFAZOLIN <=4 SENSITIVE Sensitive     CEFEPIME <=1 SENSITIVE Sensitive     CEFTAZIDIME <=1 SENSITIVE Sensitive     CEFTRIAXONE <=1 SENSITIVE Sensitive     CIPROFLOXACIN 1 SENSITIVE Sensitive     GENTAMICIN <=1 SENSITIVE Sensitive     IMIPENEM <=0.25 SENSITIVE Sensitive     PIP/TAZO <=4 SENSITIVE Sensitive     TOBRAMYCIN <=1 SENSITIVE Sensitive     TRIMETH/SULFA Value in next row Resistant      >=320 RESISTANT(NOTE)    * ABUNDANT ESCHERICHIA COLI  Culture, blood (Routine X 2) w Reflex to ID Panel     Status: None (Preliminary result)   Collection Time: 11/11/15  2:06 PM  Result Value Ref Range Status   Specimen Description BLOOD LEFT HAND  Final   Special Requests BOTTLES DRAWN AEROBIC ONLY [place]  Final   Culture   Final    NO GROWTH < 24 HOURS Performed at [facility]    Report Status PENDING  Incomplete  Culture, blood (Routine X 2) w Reflex to ID Panel     Status: None (Preliminary result)   Collection Time: 11/11/15  2:09 PM  Result Value Ref Range Status   Specimen Description BLOOD RIGHT HAND  Final   Special Requests BOTTLES DRAWN AEROBIC AND ANAEROBIC [place]  Final   Culture   Final    NO GROWTH < 24 HOURS Performed at [facility]    Report Status PENDING  Incomplete    Radiology Reports Ct Abdomen Pelvis Wo Contrast  10/15/2015  CLINICAL DATA:  Acute onset of generalized abdominal pain and diarrhea. Initial encounter. EXAM: CT ABDOMEN AND PELVIS WITHOUT CONTRAST  TECHNIQUE: Multidetector CT imaging of the abdomen and pelvis was performed following the standard protocol without IV contrast. COMPARISON:  CT of the abdomen and pelvis performed 09/12/2012 FINDINGS: Minimal bibasilar atelectasis or scarring is noted. Diffuse coronary artery calcifications are seen. There is marked thinning of the myocardium at the ventricular apex, with associated calcification and mild focal bulging, reflecting prior myocardial infarction. Calcification is noted at the aortic and mitral valves. Scattered calcified granulomata are noted within the liver. The liver and spleen are otherwise grossly unremarkable. The patient is status post cholecystectomy, with clips noted along the gallbladder fossa. The pancreas and adrenal glands are grossly unremarkable. A few scattered bilateral renal cysts are seen, measuring up to 3.8 cm in size. There is question of minimal associated calcification at a small right renal cyst. Nonspecific perinephric stranding is noted bilaterally.  Minimal right-sided hydronephrosis is nonspecific. No definite distal obstruction is seen. Scattered vascular calcifications are seen at the renal hila. No free fluid is identified. The small bowel is unremarkable in appearance. The stomach is within normal limits. No acute vascular abnormalities are seen. Relatively diffuse calcification is noted along the abdominal aorta and its branches. There is minimal ectasia of the infrarenal abdominal aorta, without evidence of aneurysmal dilatation. The patient is status post appendectomy. The colon is largely filled with fluid, corresponding to the patient's diarrhea. The bladder is mildly distended and grossly unremarkable. A reservoir is noted at the right hemipelvis. Scattered brachytherapy seeds are noted at the prostate bed. No inguinal lymphadenopathy is seen. No acute osseous abnormalities are identified. Multilevel vacuum phenomenon is noted along the lumbar spine. There is  minimal grade 1 retrolisthesis of L3 on L4, and minimal grade 1 anterolisthesis of L4 on L5, reflecting underlying facet disease. IMPRESSION: 1. Colon largely filled with fluid, corresponding to the patient's diarrhea. 2. Marked thinning of the myocardium at the ventricular apex, with associated calcification and mild focal bulging, reflecting prior myocardial infarction. 3. Diffuse coronary artery calcifications seen. Calcification at the aortic and mitral valves. 4. Few scattered bilateral renal cysts noted. Minimal nonspecific right-sided hydronephrosis, without evidence of distal obstruction. 5. Relatively diffuse calcification along the abdominal aorta and its branches. 6. Mild degenerative change along the lumbar spine. Electronically Signed   By: Garald Balding M.D.   On: 10/15/2015 06:13   Dg Chest 2 View  11/09/2015  CLINICAL DATA:  Shortness of breath. EXAM: CHEST  2 VIEW COMPARISON:  10/28/2015 chest radiograph. FINDINGS: Surgical clips in the medial lower right neck. Stable cardiomediastinal silhouette with normal heart size. No pneumothorax. No pleural effusion. Patchy consolidation throughout both lower lobes. No pulmonary edema. IMPRESSION: Patchy bilateral lower lobe consolidation, most suggestive of multilobar pneumonia. Recommend follow-up PA and lateral post treatment chest radiographs in 4-6 weeks. Electronically Signed   By: Ilona Sorrel M.D.   On: 11/09/2015 20:38   Dg Chest 2 View  10/25/2015  CLINICAL DATA:  Shortness of breath and cough EXAM: CHEST  2 VIEW COMPARISON:  10/24/15 FINDINGS: Cardiac shadow is stable. The lungs are well aerated bilaterally. Small effusions are again noted. No focal confluent infiltrate is seen. IMPRESSION: Small bilateral pleural effusions. Improved aeration in the bases is noted. Electronically Signed   By: Inez Catalina M.D.   On: 10/25/2015 10:07   Dg Chest 2 View  10/24/2015  CLINICAL DATA:  Productive cough for 2-3 days. EXAM: CHEST - 2 VIEW  COMPARISON:  Two-view chest x-ray 09/20/2015. FINDINGS: The heart size is normal. Small bilateral pleural effusions are now present. Mild bibasilar airspace opacities are evident posteriorly. The upper lung fields are clear. Emphysematous changes are again noted. Atherosclerotic calcifications are present in the aortic arch. Surgical clips are present in the right neck and at the gallbladder fossa. IMPRESSION: 1. New bilateral pleural effusions and posterior lower lobe airspace disease. While this may represent atelectasis, infection is not excluded. 2. Emphysema. 3. Atherosclerosis of the thoracic aorta. Electronically Signed   By: San Morelle M.D.   On: 10/24/2015 13:38   Ct Chest Wo Contrast  11/10/2015  CLINICAL DATA:  Shortness of breath and cough EXAM: CT CHEST WITHOUT CONTRAST TECHNIQUE: Multidetector CT imaging of the chest was performed following the standard protocol without IV contrast. COMPARISON:  11/09/2015 FINDINGS: The lungs are well aerated bilaterally. Patchy infiltrates are seen bilaterally worst in the right middle and  right lower lobes consistent with acute pneumonia. Some scattered more nodular appearing infiltrates are seen as well. One of these nodular areas measures approximately 6 mm in the right upper lobe best seen on image number 30 of series 5. Second somewhat nodular area is noted on the left measuring 12 mm is in dimension. This is best seen on image number 27 of series 5. A small right-sided pleural effusion is seen. The thoracic inlet is within normal limits. Aortic calcifications are noted. No significant hilar or mediastinal adenopathy is seen. Some calcification in the left ventricular wall is noted likely related to prior infarct. The visualized upper abdomen shows changes of prior cholecystectomy. A left renal cyst is noted as well. Degenerative changes of the thoracic spine are noted. IMPRESSION: Infiltrative changes in both lungs worst in the right middle and  right lower lobes. Some nodularity is noted in scattered areas throughout both lungs. This all again likely represents an acute pneumonic process. Short-term followup following appropriate therapy with noncontrast CT of the chest is recommended to assess for resolution or stability. This could be performed in 4-6 weeks Electronically Signed   By: Inez Catalina M.D.   On: 11/10/2015 12:44   Dg Chest Port 1 View  10/28/2015  CLINICAL DATA:  COPD exacerbation.  Short of breath EXAM: PORTABLE CHEST 1 VIEW COMPARISON:  10/26/2015 FINDINGS: Endotracheal tube and NG tubes have been removed. Bilateral airspace disease in the bases left greater than right shows interval improvement. Minimal pleural effusion bilaterally. IMPRESSION: Interval improvement in bilateral airspace disease consistent with clearing edema. Endotracheal tube removed in the interval. Electronically Signed   By: Franchot Gallo M.D.   On: 10/28/2015 07:33   Dg Chest Port 1 View  10/26/2015  CLINICAL DATA:  Intubation EXAM: PORTABLE CHEST 1 VIEW COMPARISON:  Yesterday FINDINGS: New endotracheal tube with tip between the clavicular heads and carina. An orogastric tube reaches stomach at least. Diffuse interstitial opacities are improved. Kerley lines are still noted. There is asymmetric airspace disease to the left. No definitive effusion. When accounting for a skin fold on the left there is no visible pneumothorax. IMPRESSION: 1. New endotracheal and orogastric tubes are in good position. 2. Pulmonary edema with improvement since earlier. 3. Airspace disease which could be asymmetric alveolar edema or superimposed pneumonia. Electronically Signed   By: Monte Fantasia M.D.   On: 10/26/2015 02:00   Dg Chest Port 1 View  10/26/2015  CLINICAL DATA:  Acute onset of shortness of breath. Initial encounter. EXAM: PORTABLE CHEST 1 VIEW COMPARISON:  Chest radiograph performed earlier today at 9:24 a.m. FINDINGS: The lungs are hyperexpanded. The lung bases  are incompletely imaged on this study. Vascular congestion is noted. Bibasilar airspace opacities likely reflect pulmonary edema, significantly worsened from the recent prior study, though pneumonia could have a similar appearance. No definite pleural effusion or pneumothorax is seen. The cardiomediastinal silhouette is within normal limits. No acute osseous abnormalities are seen. IMPRESSION: Lungs hypoexpanded. Vascular congestion noted. Bibasilar airspace opacities likely reflect pulmonary edema, significantly worsened from the prior study, though pneumonia could have a similar appearance. Electronically Signed   By: Garald Balding M.D.   On: 10/26/2015 00:06   Dg Abd 2 Views  10/29/2015  CLINICAL DATA:  Partial small bowel obstruction. EXAM: ABDOMEN - 2 VIEW COMPARISON:  10/26/2015 FINDINGS: Nasogastric tube no longer visualized. Gaseous dilatation of small bowel remains with some air also present throughout most of the colon. Dilatation of small bowel slightly worse compared  to 3/14. Findings remain consistent with partial small bowel obstruction versus generalized ileus. Decubitus film shows no evidence of free intraperitoneal air. No abnormal calcifications identified. IMPRESSION: Slight worsening of partial small bowel obstruction versus ileus. Electronically Signed   By: Aletta Edouard M.D.   On: 10/29/2015 11:39   Dg Abd 2 Views  10/17/2015  CLINICAL DATA:  Follow-up small bowel obstruction EXAM: ABDOMEN - 2 VIEW COMPARISON:  10/16/2015 FINDINGS: Oral contrast is seen primarily throughout the large bowel with air-fluid levels throughout the large bowel. Contrast is seen into the distal sigmoid. Large bowel is relatively decompressed as compared to small bowel, with numerous distended loops of mid abdominal small bowel again identified measuring up to about 4.5 cm in diameter. Small bowel air-fluid levels are stable. There is opacity at the left lung base most consistent with atelectasis. IMPRESSION:  Stable findings of small bowel obstruction Electronically Signed   By: Skipper Cliche M.D.   On: 10/17/2015 08:52   Dg Abd Acute W/chest  10/14/2015  CLINICAL DATA:  Acute onset of generalized abdominal bloating. Initial encounter. EXAM: DG ABDOMEN ACUTE W/ 1V CHEST COMPARISON:  Chest radiograph performed 09/20/2015 FINDINGS: The lungs are well-aerated and clear. There is no evidence of focal opacification, pleural effusion or pneumothorax. The cardiomediastinal silhouette is within normal limits. There is dilatation of small bowel loops up to 5.8 cm in maximal diameter, with associated air-fluid levels, concerning for small bowel obstruction. Residual air is still seen in the colon. No free intra-abdominal air is identified on the provided upright view. Clips are noted within the right upper quadrant, reflecting prior cholecystectomy. Clips are also noted at the right side of the neck. No acute osseous abnormalities are seen; the sacroiliac joints are unremarkable in appearance. IMPRESSION: 1. Dilatation of small bowel loops up to 5.8 cm in maximal diameter, with air-fluid levels, concerning for relatively high-grade small bowel obstruction. 2. No acute cardiopulmonary process seen. These results were called by telephone at the time of interpretation on 10/14/2015 at 11:29 pm to [facility] in the [facility] ER, who verbally acknowledged these results. Electronically Signed   By: Garald Balding M.D.   On: 10/14/2015 23:30   Dg Abd Portable 1v  10/26/2015  CLINICAL DATA:  Followup partial small bowel obstruction. EXAM: PORTABLE ABDOMEN - 1 VIEW COMPARISON:  10/20/2015 and earlier, including CT abdomen and pelvis 10/15/2015. FINDINGS: Persisting gaseous distention of multiple loops of small bowel in the mid abdomen. Gas and stool in normal caliber colon. Nasogastric tube looped in the stomach with its tip in the distal body. No suggestion of free air on the supine image. Prior cholecystectomy. Developing airspace  opacities in both lung bases. IMPRESSION: 1. No significant change since yesterday in the partial small bowel obstruction. 2. Developing pneumonia in both lung bases. Electronically Signed   By: Evangeline Dakin M.D.   On: 10/26/2015 07:58   Dg Abd Portable 1v  10/20/2015  CLINICAL DATA:  Followup small bowel obstruction. EXAM: PORTABLE ABDOMEN - 1 VIEW COMPARISON:  10/17/2015.  CT, 10/15/2015. FINDINGS: Mild dilation of small bowel is seen centrally. There is air in stool in the colon as well as some contrast in the left colon. The degree of small bowel dilation is mildly decreased from the prior exam. No new abnormalities. IMPRESSION: 1. Mild improvement in the partial small bowel obstruction with less small bowel dilation noted on the current study. Electronically Signed   By: Lajean Manes M.D.   On: 10/20/2015 11:56  Dg Abd Portable 1v  10/16/2015  CLINICAL DATA:  Small bowel obstruction. EXAM: PORTABLE ABDOMEN - 1 VIEW COMPARISON:  CT scan October 15, 2015 FINDINGS: There is contrast within the decompressed colon. Dilated loops of small bowel are seen diffusely, most prominent in the central and left abdomen. No free air, portal venous gas, or pneumatosis identified. No other acute abnormalities. IMPRESSION: Small bowel obstruction. Electronically Signed   By: Dorise Bullion III M.D   On: 10/16/2015 09:24     CBC  Recent Labs Lab 11/09/15 1923 11/10/15 0508 11/11/15 0524 11/12/15 0523  WBC 8.6 10.1 8.3 8.9  HGB 8.5* 8.5* 7.5* 7.4*  HCT 27.7* 27.5* 23.5* 23.4*  PLT 110* 121* 103* 92*  MCV 61.7* 60.6* 61.2* 60.6*  MCH 18.9* 18.7* 19.5* 19.2*  MCHC 30.7 30.9 31.9 31.6  RDW 16.7* 16.6* 16.4* 16.2*  LYMPHSABS 0.3* 0.8  --   --   MONOABS 0.3 0.4  --   --   EOSABS 0.0 0.1  --   --   BASOSABS 0.0 0.0  --   --     Chemistries   Recent Labs Lab 11/09/15 1923 11/10/15 0508 11/11/15 0524 11/12/15 0523  NA 141 143 137 139  K 3.9 3.4* 3.5 3.7  CL 104 104 101 102  CO2 28 30 29 29     GLUCOSE 293* 109* 163* 157*  BUN 32* 25* 28* 32*  CREATININE 1.29* 1.07 0.91 1.38*  CALCIUM 8.4* 8.3* 8.0* 8.2*  AST 14*  --   --   --   ALT 16*  --   --   --   ALKPHOS 60  --   --   --   BILITOT 0.4  --   --   --    ------------------------------------------------------------------------------------------------------------------ estimated creatinine clearance is 39 mL/min (by C-G formula based on Cr of 1.38). ------------------------------------------------------------------------------------------------------------------  Recent Labs  11/10/15 0508  HGBA1C 7.3*   ------------------------------------------------------------------------------------------------------------------ No results for input(s): CHOL, HDL, LDLCALC, TRIG, CHOLHDL, LDLDIRECT in the last 72 hours. ------------------------------------------------------------------------------------------------------------------ No results for input(s): TSH, T4TOTAL, T3FREE, THYROIDAB in the last 72 hours.  Invalid input(s): FREET3 ------------------------------------------------------------------------------------------------------------------  Recent Labs  11/11/15 0524 11/11/15 1409 11/11/15 1422  VITAMINB12  --  581  --   FOLATE  --   --  6.6  FERRITIN  --  122  --   TIBC  --  186*  --   IRON  --  33*  --   RETICCTPCT 1.3  --   --     Coagulation profile No results for input(s): INR, PROTIME in the last 168 hours.  No results for input(s): DDIMER in the last 72 hours.  Cardiac Enzymes  Recent Labs Lab 11/09/15 1923  TROPONINI 0.03   ------------------------------------------------------------------------------------------------------------------ Invalid input(s): POCBNP     Time Spent in minutes   30 minutes   Keelyn Monjaras M.D on 11/12/2015 at 2:46 PM  Between 7am to 7pm - Pager - [phone]  After 7pm go to [email or web address] - password [facility]  [facility]   Office  [phone]

## 2015-11-13 LAB — BASIC METABOLIC PANEL
Anion gap: 9 (ref 5–15)
BUN: 33 mg/dL — ABNORMAL HIGH (ref 6–20)
CHLORIDE: 100 mmol/L — AB (ref 101–111)
CO2: 32 mmol/L (ref 22–32)
CREATININE: 1.1 mg/dL (ref 0.61–1.24)
Calcium: 8.3 mg/dL — ABNORMAL LOW (ref 8.9–10.3)
GFR calc non Af Amer: 57 mL/min — ABNORMAL LOW (ref >60)
GLUCOSE: 144 mg/dL — AB (ref 65–99)
Potassium: 3.3 mmol/L — ABNORMAL LOW (ref 3.5–5.1)
Sodium: 141 mmol/L (ref 135–145)

## 2015-11-13 LAB — CBC
HEMATOCRIT: 25.2 % — AB (ref 39.0–52.0)
HEMOGLOBIN: 7.7 g/dL — AB (ref 13.0–17.0)
MCH: 18.4 pg — AB (ref 26.0–34.0)
MCHC: 30.6 g/dL (ref 30.0–36.0)
MCV: 60.1 fL — AB (ref 78.0–100.0)
Platelets: 111 10*3/uL — ABNORMAL LOW (ref 150–400)
RBC: 4.19 MIL/uL — ABNORMAL LOW (ref 4.22–5.81)
RDW: 16.1 % — ABNORMAL HIGH (ref 11.5–15.5)
WBC: 8.1 10*3/uL (ref 4.0–10.5)

## 2015-11-13 LAB — GLUCOSE, CAPILLARY
GLUCOSE-CAPILLARY: 153 mg/dL — AB (ref 65–99)
GLUCOSE-CAPILLARY: 111 mg/dL — AB (ref 65–99)
Glucose-Capillary: 174 mg/dL — ABNORMAL HIGH (ref 65–99)
Glucose-Capillary: 187 mg/dL — ABNORMAL HIGH (ref 65–99)
Glucose-Capillary: 199 mg/dL — ABNORMAL HIGH (ref 65–99)
Glucose-Capillary: 205 mg/dL — ABNORMAL HIGH (ref 65–99)

## 2015-11-13 MED ORDER — LORAZEPAM 0.5 MG PO TABS
0.5000 mg | ORAL_TABLET | Freq: Once | ORAL | Status: AC
  Administered 2015-11-13: 0.5 mg via ORAL
  Filled 2015-11-13: qty 1

## 2015-11-13 MED ORDER — POTASSIUM CHLORIDE CRYS ER 20 MEQ PO TBCR
40.0000 meq | EXTENDED_RELEASE_TABLET | Freq: Once | ORAL | Status: AC
  Administered 2015-11-13: 40 meq via ORAL
  Filled 2015-11-13: qty 2

## 2015-11-13 MED ORDER — LOSARTAN POTASSIUM 25 MG PO TABS
25.0000 mg | ORAL_TABLET | Freq: Every day | ORAL | Status: DC
  Administered 2015-11-14 – 2015-11-15 (×2): 25 mg via ORAL
  Filled 2015-11-13 (×2): qty 1

## 2015-11-13 MED ORDER — PREDNISONE 20 MG PO TABS
40.0000 mg | ORAL_TABLET | Freq: Every day | ORAL | Status: DC
  Administered 2015-11-13 – 2015-11-14 (×2): 40 mg via ORAL
  Filled 2015-11-13 (×3): qty 2

## 2015-11-13 MED ORDER — CARVEDILOL 6.25 MG PO TABS
6.2500 mg | ORAL_TABLET | Freq: Two times a day (BID) | ORAL | Status: DC
  Administered 2015-11-14 – 2015-11-15 (×3): 6.25 mg via ORAL
  Filled 2015-11-13 (×5): qty 1

## 2015-11-13 MED ORDER — FUROSEMIDE 40 MG PO TABS
40.0000 mg | ORAL_TABLET | Freq: Every day | ORAL | Status: DC
Start: 2015-11-13 — End: 2015-11-15
  Administered 2015-11-13 – 2015-11-15 (×3): 40 mg via ORAL
  Filled 2015-11-13 (×3): qty 1

## 2015-11-13 NOTE — Progress Notes (Signed)
Patient Demographics  [name], is a 82 y.o. male, DOB - [birth year], [id number]  Admit date - 11/09/2015   Admitting Physician Norval Morton, MD  Outpatient Primary MD for the patient is  Melinda Crutch, MD  LOS - 4   Chief Complaint  Patient presents with  . Pneumonia       Admission HPI/Brief narrative: 82 year old male with history of  COPD, CKD stage III, HTN, ischemic cardiomyopathy, systolic CHF last EF [id number] in 10/2015, multiple admission over the last month for respiratory failure secondary to a CPAP, COPD and CHF exacerbation, presents from a facility for complaints of cough on shortness of breath chest x-ray significant for pneumonia, as well blood culture growing Escherichia coli, Subjective:   [name] today has, No headache, No chest pain, No abdominal pain , dyspnea is improving, reports cough,Remains productive . Assessment & Plan    Principal Problem:   HCAP (healthcare-associated pneumonia) Active Problems:   Acute on chronic combined systolic and diastolic congestive heart failure, NYHA class 3 (HCC)   Anemia, iron deficiency   COPD (chronic obstructive pulmonary disease) (HCC)   Essential hypertension   Pressure ulcer   GERD (gastroesophageal reflux disease)   E coli bacteremia  HCAP - Multiple recent hospitalizations, treated for pneumonia in the past, his x-ray with evidence of pneumonia,Treated initially with IV vancomycin and Zosyn, sputum culture growing Escherichia coli, will transition to IV Rocephin. - CT chest was obtained with evidence of multilobar pneumonia, needs repeat CT chest without contrast for follow-up on resolution.  Escherichia coli bacteremia, UTI - Blood culture growing Escherichia coli, initially on IV cefepime , narrowed to IV Rocephin 3/31 given sensitivities. - Follow on urine culture - Follow on the repeat blood cultures 3/30: Remains with no  growth to date  COPD with mild exacerbation - Patient with mild wheezing on physical exam, continue with nebs, home medication including Spiriva, as well continue with desonide and Brovana. - We'll taper steroids from 60 mg to 40 mg oral today.  Acute on chronic systolic CHF - Patient's EF 40-45% on last echocardiogram done in 10/2015. Patient reporting increased leg swelling. Physical exam reveals 2+ pitting edema bilateral lower extremities - Strict ins and outs and daily weights - TED hose -IV diuresis has been held yesterday given worsening renal function, will resume today at a lower dose. - Continue aspirin, Beta blocker and losartan  Anemia of iron deficiency and chronic disease - patient with known chronic iron deficiency anemia, and chronic illness, baseline hemoglobin 7.5-8.5 over last few weeks, he is Hemoccult positive, most likely related to iron supplements, as no evidence of bleed has has normal color stools and his hemoglobin remained stable. - Discussed with [facility], endoscopy 2009 with no acute finding, colonoscopy 2005 with benign polyps, at this point patient is not a candidate for any invasive workup giving his multiple comorbidities. - Continue with iron supplement,received  one-time IV Feraheme.  Hyperglycemia:  -Suspected to be steroid-induced however patient blood glucose on arrival is 293. Previously seen have elevated hemoglobin A1c of 6.6 back in 2012  - follow on  hemoglobin A1c is 7.3 - continue with SSI  Pressure ulcer  - Wound care consult appreciated   Essential hypertension -  Continue losartan, Coreg, blood pressure on the lower side, continue to monitor  Jerrye Bushy - Protonix    Code Status: full  Family Communication: none at bedside  Disposition Plan:SNF placement in 1-2 days  Procedures  none   Consults   none   Medications  Scheduled Meds: . acidophilus  2 capsule Oral Daily  . albuterol  2.5 mg Nebulization QID  . arformoterol   15 mcg Nebulization BID  . aspirin EC  81 mg Oral Daily  . budesonide (PULMICORT) nebulizer solution  0.5 mg Nebulization BID  . carvedilol  12.5 mg Oral BID WC  . cefTRIAXone (ROCEPHIN)  IV  2 g Intravenous Daily  . enoxaparin (LOVENOX) injection  40 mg Subcutaneous QHS  . ferrous sulfate  325 mg Oral BID WC  . guaiFENesin  600 mg Oral BID  . insulin aspart  0-9 Units Subcutaneous 6 times per day  . Linaclotide  145 mcg Oral Q breakfast  . losartan  25 mg Oral Daily  . magic mouthwash  5 mL Oral QID  . metoCLOPramide  5 mg Oral TID AC  . pantoprazole  40 mg Oral Daily  . polyethylene glycol  17 g Oral BID  . predniSONE  40 mg Oral Q breakfast  . pregabalin  75 mg Oral BID  . senna-docusate  1 tablet Oral BID  . simvastatin  10 mg Oral q1800  . sodium chloride flush  3 mL Intravenous Q12H  . tiotropium  18 mcg Inhalation Daily   Continuous Infusions:  PRN Meds:.sodium chloride, acetaminophen, albuterol, benzonatate, nitroGLYCERIN, ondansetron (ZOFRAN) IV, sodium chloride flush  DVT Prophylaxis  Lovenox   Lab Results  Component Value Date   PLT 111* 11/13/2015    Antibiotics    Anti-infectives    Start     Dose/Rate Route Frequency Ordered Stop   11/12/15 1100  cefTRIAXone (ROCEPHIN) 2 g in dextrose 5 % 50 mL IVPB     2 g 100 mL/hr over 30 Minutes Intravenous Daily 11/12/15 0934     11/11/15 1000  ceFEPIme (MAXIPIME) 1 g in dextrose 5 % 50 mL IVPB  Status:  Discontinued     1 g 100 mL/hr over 30 Minutes Intravenous Every 8 hours 11/11/15 0709 11/12/15 0934   11/10/15 1400  ceFEPIme (MAXIPIME) 1 g in dextrose 5 % 50 mL IVPB  Status:  Discontinued     1 g 100 mL/hr over 30 Minutes Intravenous Every 12 hours 11/10/15 0921 11/11/15 0709   11/10/15 0600  aztreonam (AZACTAM) 1 g in dextrose 5 % 50 mL IVPB  Status:  Discontinued     1 g 100 mL/hr over 30 Minutes Intravenous 3 times per day 11/10/15 0028 11/10/15 0921   11/09/15 2115  vancomycin (VANCOCIN) IVPB 1000 mg/200 mL  premix  Status:  Discontinued     1,000 mg 200 mL/hr over 60 Minutes Intravenous Every 24 hours 11/09/15 2108 11/12/15 0934   11/09/15 2100  aztreonam (AZACTAM) 2 g in dextrose 5 % 50 mL IVPB     2 g 100 mL/hr over 30 Minutes Intravenous  Once 11/09/15 2045 11/09/15 2147          Objective:   Filed Vitals:   11/12/15 2215 11/13/15 0420 11/13/15 0909 11/13/15 0920  BP: 98/57 117/46    Pulse: 69 66    Temp: 97.5 F (36.4 C) 97.4 F (36.3 C)    TempSrc:  Oral    Resp: 18 18    Height:  Weight:  71.94 kg (158 lb 9.6 oz)    SpO2: 97% 94% 89% 89%    Wt Readings from Last 3 Encounters:  11/13/15 71.94 kg (158 lb 9.6 oz)  11/09/15 75.864 kg (167 lb 4 oz)  11/05/15 75.864 kg (167 lb 4 oz)     Intake/Output Summary (Last 24 hours) at 11/13/15 1140 Last data filed at 11/13/15 0900  Gross per 24 hour  Intake    240 ml  Output    825 ml  Net   -585 ml     Physical Exam  Awake Alert, Oriented X 3,  Supple Neck,No JVD, No cervical lymphadenopathy appriciated.  Symmetrical Chest wall movement, bilateral wheezing, rhonchi, RRR,No Gallops,Rubs or new Murmurs, No Parasternal Heave +ve B.Sounds, Abd Soft, No tenderness, No organomegaly appriciated, No rebound - guarding or rigidity. No Cyanosis, Clubbing + bilateral edema,   Data Review   Micro Results Recent Results (from the past 240 hour(s))  Culture, blood (routine x 2)     Status: None   Collection Time: 11/09/15  7:40 PM  Result Value Ref Range Status   Specimen Description BLOOD LEFT ANTECUBITAL  Final   Special Requests BOTTLES DRAWN AEROBIC AND ANAEROBIC 10 CC  Final   Culture  Setup Time   Final    GRAM NEGATIVE RODS ANAEROBIC BOTTLE ONLY CRITICAL RESULT CALLED TO, READ BACK BY AND VERIFIED WITH: E. CAUDLE,RN AT [id number] ON [id number] BY S. YARBROUGH    Culture   Final    ESCHERICHIA COLI SUSCEPTIBILITIES PERFORMED ON PREVIOUS CULTURE WITHIN THE LAST 5 DAYS. Performed at [facility]    Report Status  11/12/2015 FINAL  Final  Culture, blood (routine x 2)     Status: None   Collection Time: 11/09/15  7:44 PM  Result Value Ref Range Status   Specimen Description BLOOD RIGHT ANTECUBITAL  Final   Special Requests BOTTLES DRAWN AEROBIC AND ANAEROBIC 10 CC  Final   Culture  Setup Time   Final    GRAM NEGATIVE RODS IN BOTH AEROBIC AND ANAEROBIC BOTTLES CRITICAL RESULT CALLED TO, READ BACK BY AND VERIFIED WITH: E. CAUDLE,RN AT [id number] ON [id number] BY Rhea Bleacher    Culture   Final    ESCHERICHIA COLI Performed at [facility]    Report Status 11/12/2015 FINAL  Final   Organism ID, Bacteria ESCHERICHIA COLI  Final      Susceptibility   Escherichia coli - MIC*    AMPICILLIN >=32 RESISTANT Resistant     CEFAZOLIN <=4 SENSITIVE Sensitive     CEFEPIME <=1 SENSITIVE Sensitive     CEFTAZIDIME <=1 SENSITIVE Sensitive     CEFTRIAXONE <=1 SENSITIVE Sensitive     CIPROFLOXACIN 1 SENSITIVE Sensitive     GENTAMICIN <=1 SENSITIVE Sensitive     IMIPENEM <=0.25 SENSITIVE Sensitive     TRIMETH/SULFA >=320 RESISTANT Resistant     AMPICILLIN/SULBACTAM 16 INTERMEDIATE Intermediate     PIP/TAZO <=4 SENSITIVE Sensitive     * ESCHERICHIA COLI  Urine culture     Status: None   Collection Time: 11/10/15 12:06 AM  Result Value Ref Range Status   Specimen Description URINE, CLEAN CATCH  Final   Special Requests NONE  Final   Culture   Final    60,000 COLONIES/ml YEAST Performed at [facility]    Report Status 11/11/2015 FINAL  Final  Culture, sputum-assessment     Status: None   Collection Time: 11/10/15 12:06 AM  Result Value Ref Range  Status   Specimen Description SPUTUM  Final   Special Requests NONE  Final   Sputum evaluation   Final    THIS SPECIMEN IS ACCEPTABLE. RESPIRATORY CULTURE REPORT TO FOLLOW.   Report Status 11/10/2015 FINAL  Final  Culture, respiratory (NON-Expectorated)     Status: None   Collection Time: 11/10/15 12:06 AM  Result Value Ref Range Status   Specimen  Description SPUTUM  Final   Special Requests NONE  Final   Gram Stain   Final    MODERATE WBC PRESENT,BOTH PMN AND MONONUCLEAR FEW SQUAMOUS EPITHELIAL CELLS PRESENT ABUNDANT GRAM NEGATIVE RODS MODERATE GRAM POSITIVE COCCI IN PAIRS IN CLUSTERS FEW YEAST THIS SPECIMEN IS ACCEPTABLE FOR SPUTUM CULTURE Performed at [facility]    Culture   Final    ABUNDANT ESCHERICHIA COLI Performed at [facility]    Report Status 11/12/2015 FINAL  Final   Organism ID, Bacteria ESCHERICHIA COLI  Final      Susceptibility   Escherichia coli - MIC*    AMPICILLIN >=32 RESISTANT Resistant     AMPICILLIN/SULBACTAM 16 INTERMEDIATE Intermediate     CEFAZOLIN <=4 SENSITIVE Sensitive     CEFEPIME <=1 SENSITIVE Sensitive     CEFTAZIDIME <=1 SENSITIVE Sensitive     CEFTRIAXONE <=1 SENSITIVE Sensitive     CIPROFLOXACIN 1 SENSITIVE Sensitive     GENTAMICIN <=1 SENSITIVE Sensitive     IMIPENEM <=0.25 SENSITIVE Sensitive     PIP/TAZO <=4 SENSITIVE Sensitive     TOBRAMYCIN <=1 SENSITIVE Sensitive     TRIMETH/SULFA Value in next row Resistant      >=320 RESISTANT(NOTE)    * ABUNDANT ESCHERICHIA COLI  Culture, blood (Routine X 2) w Reflex to ID Panel     Status: None (Preliminary result)   Collection Time: 11/11/15  2:06 PM  Result Value Ref Range Status   Specimen Description BLOOD LEFT HAND  Final   Special Requests BOTTLES DRAWN AEROBIC ONLY [place]  Final   Culture   Final    NO GROWTH < 24 HOURS Performed at [facility]    Report Status PENDING  Incomplete  Culture, blood (Routine X 2) w Reflex to ID Panel     Status: None (Preliminary result)   Collection Time: 11/11/15  2:09 PM  Result Value Ref Range Status   Specimen Description BLOOD RIGHT HAND  Final   Special Requests BOTTLES DRAWN AEROBIC AND ANAEROBIC [place]  Final   Culture   Final    NO GROWTH < 24 HOURS Performed at [facility]    Report Status PENDING  Incomplete    Radiology Reports Ct Abdomen Pelvis Wo  Contrast  10/15/2015  CLINICAL DATA:  Acute onset of generalized abdominal pain and diarrhea. Initial encounter. EXAM: CT ABDOMEN AND PELVIS WITHOUT CONTRAST TECHNIQUE: Multidetector CT imaging of the abdomen and pelvis was performed following the standard protocol without IV contrast. COMPARISON:  CT of the abdomen and pelvis performed 09/12/2012 FINDINGS: Minimal bibasilar atelectasis or scarring is noted. Diffuse coronary artery calcifications are seen. There is marked thinning of the myocardium at the ventricular apex, with associated calcification and mild focal bulging, reflecting prior myocardial infarction. Calcification is noted at the aortic and mitral valves. Scattered calcified granulomata are noted within the liver. The liver and spleen are otherwise grossly unremarkable. The patient is status post cholecystectomy, with clips noted along the gallbladder fossa. The pancreas and adrenal glands are grossly unremarkable. A few scattered bilateral renal cysts are seen, measuring  up to 3.8 cm in size. There is question of minimal associated calcification at a small right renal cyst. Nonspecific perinephric stranding is noted bilaterally. Minimal right-sided hydronephrosis is nonspecific. No definite distal obstruction is seen. Scattered vascular calcifications are seen at the renal hila. No free fluid is identified. The small bowel is unremarkable in appearance. The stomach is within normal limits. No acute vascular abnormalities are seen. Relatively diffuse calcification is noted along the abdominal aorta and its branches. There is minimal ectasia of the infrarenal abdominal aorta, without evidence of aneurysmal dilatation. The patient is status post appendectomy. The colon is largely filled with fluid, corresponding to the patient's diarrhea. The bladder is mildly distended and grossly unremarkable. A reservoir is noted at the right hemipelvis. Scattered brachytherapy seeds are noted at the prostate bed. No  inguinal lymphadenopathy is seen. No acute osseous abnormalities are identified. Multilevel vacuum phenomenon is noted along the lumbar spine. There is minimal grade 1 retrolisthesis of L3 on L4, and minimal grade 1 anterolisthesis of L4 on L5, reflecting underlying facet disease. IMPRESSION: 1. Colon largely filled with fluid, corresponding to the patient's diarrhea. 2. Marked thinning of the myocardium at the ventricular apex, with associated calcification and mild focal bulging, reflecting prior myocardial infarction. 3. Diffuse coronary artery calcifications seen. Calcification at the aortic and mitral valves. 4. Few scattered bilateral renal cysts noted. Minimal nonspecific right-sided hydronephrosis, without evidence of distal obstruction. 5. Relatively diffuse calcification along the abdominal aorta and its branches. 6. Mild degenerative change along the lumbar spine. Electronically Signed   By: Garald Balding M.D.   On: 10/15/2015 06:13   Dg Chest 2 View  11/09/2015  CLINICAL DATA:  Shortness of breath. EXAM: CHEST  2 VIEW COMPARISON:  10/28/2015 chest radiograph. FINDINGS: Surgical clips in the medial lower right neck. Stable cardiomediastinal silhouette with normal heart size. No pneumothorax. No pleural effusion. Patchy consolidation throughout both lower lobes. No pulmonary edema. IMPRESSION: Patchy bilateral lower lobe consolidation, most suggestive of multilobar pneumonia. Recommend follow-up PA and lateral post treatment chest radiographs in 4-6 weeks. Electronically Signed   By: Ilona Sorrel M.D.   On: 11/09/2015 20:38   Dg Chest 2 View  10/25/2015  CLINICAL DATA:  Shortness of breath and cough EXAM: CHEST  2 VIEW COMPARISON:  10/24/15 FINDINGS: Cardiac shadow is stable. The lungs are well aerated bilaterally. Small effusions are again noted. No focal confluent infiltrate is seen. IMPRESSION: Small bilateral pleural effusions. Improved aeration in the bases is noted. Electronically Signed   By:  Inez Catalina M.D.   On: 10/25/2015 10:07   Dg Chest 2 View  10/24/2015  CLINICAL DATA:  Productive cough for 2-3 days. EXAM: CHEST - 2 VIEW COMPARISON:  Two-view chest x-ray 09/20/2015. FINDINGS: The heart size is normal. Small bilateral pleural effusions are now present. Mild bibasilar airspace opacities are evident posteriorly. The upper lung fields are clear. Emphysematous changes are again noted. Atherosclerotic calcifications are present in the aortic arch. Surgical clips are present in the right neck and at the gallbladder fossa. IMPRESSION: 1. New bilateral pleural effusions and posterior lower lobe airspace disease. While this may represent atelectasis, infection is not excluded. 2. Emphysema. 3. Atherosclerosis of the thoracic aorta. Electronically Signed   By: San Morelle M.D.   On: 10/24/2015 13:38   Ct Chest Wo Contrast  11/10/2015  CLINICAL DATA:  Shortness of breath and cough EXAM: CT CHEST WITHOUT CONTRAST TECHNIQUE: Multidetector CT imaging of the chest was performed following the standard  protocol without IV contrast. COMPARISON:  11/09/2015 FINDINGS: The lungs are well aerated bilaterally. Patchy infiltrates are seen bilaterally worst in the right middle and right lower lobes consistent with acute pneumonia. Some scattered more nodular appearing infiltrates are seen as well. One of these nodular areas measures approximately 6 mm in the right upper lobe best seen on image number 30 of series 5. Second somewhat nodular area is noted on the left measuring 12 mm is in dimension. This is best seen on image number 27 of series 5. A small right-sided pleural effusion is seen. The thoracic inlet is within normal limits. Aortic calcifications are noted. No significant hilar or mediastinal adenopathy is seen. Some calcification in the left ventricular wall is noted likely related to prior infarct. The visualized upper abdomen shows changes of prior cholecystectomy. A left renal cyst is noted  as well. Degenerative changes of the thoracic spine are noted. IMPRESSION: Infiltrative changes in both lungs worst in the right middle and right lower lobes. Some nodularity is noted in scattered areas throughout both lungs. This all again likely represents an acute pneumonic process. Short-term followup following appropriate therapy with noncontrast CT of the chest is recommended to assess for resolution or stability. This could be performed in 4-6 weeks Electronically Signed   By: Inez Catalina M.D.   On: 11/10/2015 12:44   Dg Chest Port 1 View  10/28/2015  CLINICAL DATA:  COPD exacerbation.  Short of breath EXAM: PORTABLE CHEST 1 VIEW COMPARISON:  10/26/2015 FINDINGS: Endotracheal tube and NG tubes have been removed. Bilateral airspace disease in the bases left greater than right shows interval improvement. Minimal pleural effusion bilaterally. IMPRESSION: Interval improvement in bilateral airspace disease consistent with clearing edema. Endotracheal tube removed in the interval. Electronically Signed   By: Franchot Gallo M.D.   On: 10/28/2015 07:33   Dg Chest Port 1 View  10/26/2015  CLINICAL DATA:  Intubation EXAM: PORTABLE CHEST 1 VIEW COMPARISON:  Yesterday FINDINGS: New endotracheal tube with tip between the clavicular heads and carina. An orogastric tube reaches stomach at least. Diffuse interstitial opacities are improved. Kerley lines are still noted. There is asymmetric airspace disease to the left. No definitive effusion. When accounting for a skin fold on the left there is no visible pneumothorax. IMPRESSION: 1. New endotracheal and orogastric tubes are in good position. 2. Pulmonary edema with improvement since earlier. 3. Airspace disease which could be asymmetric alveolar edema or superimposed pneumonia. Electronically Signed   By: Monte Fantasia M.D.   On: 10/26/2015 02:00   Dg Chest Port 1 View  10/26/2015  CLINICAL DATA:  Acute onset of shortness of breath. Initial encounter. EXAM:  PORTABLE CHEST 1 VIEW COMPARISON:  Chest radiograph performed earlier today at 9:24 a.m. FINDINGS: The lungs are hyperexpanded. The lung bases are incompletely imaged on this study. Vascular congestion is noted. Bibasilar airspace opacities likely reflect pulmonary edema, significantly worsened from the recent prior study, though pneumonia could have a similar appearance. No definite pleural effusion or pneumothorax is seen. The cardiomediastinal silhouette is within normal limits. No acute osseous abnormalities are seen. IMPRESSION: Lungs hypoexpanded. Vascular congestion noted. Bibasilar airspace opacities likely reflect pulmonary edema, significantly worsened from the prior study, though pneumonia could have a similar appearance. Electronically Signed   By: Garald Balding M.D.   On: 10/26/2015 00:06   Dg Abd 2 Views  10/29/2015  CLINICAL DATA:  Partial small bowel obstruction. EXAM: ABDOMEN - 2 VIEW COMPARISON:  10/26/2015 FINDINGS: Nasogastric tube no  longer visualized. Gaseous dilatation of small bowel remains with some air also present throughout most of the colon. Dilatation of small bowel slightly worse compared to 3/14. Findings remain consistent with partial small bowel obstruction versus generalized ileus. Decubitus film shows no evidence of free intraperitoneal air. No abnormal calcifications identified. IMPRESSION: Slight worsening of partial small bowel obstruction versus ileus. Electronically Signed   By: Aletta Edouard M.D.   On: 10/29/2015 11:39   Dg Abd 2 Views  10/17/2015  CLINICAL DATA:  Follow-up small bowel obstruction EXAM: ABDOMEN - 2 VIEW COMPARISON:  10/16/2015 FINDINGS: Oral contrast is seen primarily throughout the large bowel with air-fluid levels throughout the large bowel. Contrast is seen into the distal sigmoid. Large bowel is relatively decompressed as compared to small bowel, with numerous distended loops of mid abdominal small bowel again identified measuring up to about 4.5  cm in diameter. Small bowel air-fluid levels are stable. There is opacity at the left lung base most consistent with atelectasis. IMPRESSION: Stable findings of small bowel obstruction Electronically Signed   By: Skipper Cliche M.D.   On: 10/17/2015 08:52   Dg Abd Acute W/chest  10/14/2015  CLINICAL DATA:  Acute onset of generalized abdominal bloating. Initial encounter. EXAM: DG ABDOMEN ACUTE W/ 1V CHEST COMPARISON:  Chest radiograph performed 09/20/2015 FINDINGS: The lungs are well-aerated and clear. There is no evidence of focal opacification, pleural effusion or pneumothorax. The cardiomediastinal silhouette is within normal limits. There is dilatation of small bowel loops up to 5.8 cm in maximal diameter, with associated air-fluid levels, concerning for small bowel obstruction. Residual air is still seen in the colon. No free intra-abdominal air is identified on the provided upright view. Clips are noted within the right upper quadrant, reflecting prior cholecystectomy. Clips are also noted at the right side of the neck. No acute osseous abnormalities are seen; the sacroiliac joints are unremarkable in appearance. IMPRESSION: 1. Dilatation of small bowel loops up to 5.8 cm in maximal diameter, with air-fluid levels, concerning for relatively high-grade small bowel obstruction. 2. No acute cardiopulmonary process seen. These results were called by telephone at the time of interpretation on 10/14/2015 at 11:29 pm to [facility] in the [facility] ER, who verbally acknowledged these results. Electronically Signed   By: Garald Balding M.D.   On: 10/14/2015 23:30   Dg Abd Portable 1v  10/26/2015  CLINICAL DATA:  Followup partial small bowel obstruction. EXAM: PORTABLE ABDOMEN - 1 VIEW COMPARISON:  10/20/2015 and earlier, including CT abdomen and pelvis 10/15/2015. FINDINGS: Persisting gaseous distention of multiple loops of small bowel in the mid abdomen. Gas and stool in normal caliber colon. Nasogastric tube  looped in the stomach with its tip in the distal body. No suggestion of free air on the supine image. Prior cholecystectomy. Developing airspace opacities in both lung bases. IMPRESSION: 1. No significant change since yesterday in the partial small bowel obstruction. 2. Developing pneumonia in both lung bases. Electronically Signed   By: Evangeline Dakin M.D.   On: 10/26/2015 07:58   Dg Abd Portable 1v  10/20/2015  CLINICAL DATA:  Followup small bowel obstruction. EXAM: PORTABLE ABDOMEN - 1 VIEW COMPARISON:  10/17/2015.  CT, 10/15/2015. FINDINGS: Mild dilation of small bowel is seen centrally. There is air in stool in the colon as well as some contrast in the left colon. The degree of small bowel dilation is mildly decreased from the prior exam. No new abnormalities. IMPRESSION: 1. Mild improvement in the partial small bowel  obstruction with less small bowel dilation noted on the current study. Electronically Signed   By: Lajean Manes M.D.   On: 10/20/2015 11:56   Dg Abd Portable 1v  10/16/2015  CLINICAL DATA:  Small bowel obstruction. EXAM: PORTABLE ABDOMEN - 1 VIEW COMPARISON:  CT scan October 15, 2015 FINDINGS: There is contrast within the decompressed colon. Dilated loops of small bowel are seen diffusely, most prominent in the central and left abdomen. No free air, portal venous gas, or pneumatosis identified. No other acute abnormalities. IMPRESSION: Small bowel obstruction. Electronically Signed   By: Dorise Bullion III M.D   On: 10/16/2015 09:24     CBC  Recent Labs Lab 11/09/15 1923 11/10/15 [id number] 11/11/15 0524 11/12/15 0523 11/13/15 0537  WBC 8.6 10.1 8.3 8.9 8.1  HGB 8.5* 8.5* 7.5* 7.4* 7.7*  HCT 27.7* 27.5* 23.5* 23.4* 25.2*  PLT 110* 121* 103* 92* 111*  MCV 61.7* 60.6* 61.2* 60.6* 60.1*  MCH 18.9* 18.7* 19.5* 19.2* 18.4*  MCHC 30.7 30.9 31.9 31.6 30.6  RDW 16.7* 16.6* 16.4* 16.2* 16.1*  LYMPHSABS 0.3* 0.8  --   --   --   MONOABS 0.3 0.4  --   --   --   EOSABS 0.0 0.1  --   --    --   BASOSABS 0.0 0.0  --   --   --     Chemistries   Recent Labs Lab 11/09/15 1923 11/10/15 0508 11/11/15 0524 11/12/15 0523 11/13/15 0537  NA 141 143 137 139 141  K 3.9 3.4* 3.5 3.7 3.3*  CL 104 104 101 102 100*  CO2 28 30 29 29  32  GLUCOSE 293* 109* 163* 157* 144*  BUN 32* 25* 28* 32* 33*  CREATININE 1.29* 1.07 0.91 1.38* 1.10  CALCIUM 8.4* 8.3* 8.0* 8.2* 8.3*  AST 14*  --   --   --   --   ALT 16*  --   --   --   --   ALKPHOS 60  --   --   --   --   BILITOT 0.4  --   --   --   --    ------------------------------------------------------------------------------------------------------------------ estimated creatinine clearance is 46.3 mL/min (by C-G formula based on Cr of 1.1). ------------------------------------------------------------------------------------------------------------------ No results for input(s): HGBA1C in the last 72 hours. ------------------------------------------------------------------------------------------------------------------ No results for input(s): CHOL, HDL, LDLCALC, TRIG, CHOLHDL, LDLDIRECT in the last 72 hours. ------------------------------------------------------------------------------------------------------------------ No results for input(s): TSH, T4TOTAL, T3FREE, THYROIDAB in the last 72 hours.  Invalid input(s): FREET3 ------------------------------------------------------------------------------------------------------------------  Recent Labs  11/11/15 0524 11/11/15 1409 11/11/15 1422  VITAMINB12  --  581  --   FOLATE  --   --  6.6  FERRITIN  --  122  --   TIBC  --  186*  --   IRON  --  33*  --   RETICCTPCT 1.3  --   --     Coagulation profile No results for input(s): INR, PROTIME in the last 168 hours.  No results for input(s): DDIMER in the last 72 hours.  Cardiac Enzymes  Recent Labs Lab 11/09/15 1923  TROPONINI 0.03    ------------------------------------------------------------------------------------------------------------------ Invalid input(s): POCBNP     Time Spent in minutes   30 minutes   Darric Plante M.D on 11/13/2015 at 11:40 AM  Between 7am to 7pm - Pager - [phone]  After 7pm go to [email or web address] - password [email or web address]  [facility]   Office  [phone]

## 2015-11-14 LAB — GLUCOSE, CAPILLARY
GLUCOSE-CAPILLARY: 147 mg/dL — AB (ref 65–99)
Glucose-Capillary: 148 mg/dL — ABNORMAL HIGH (ref 65–99)
Glucose-Capillary: 132 mg/dL — ABNORMAL HIGH (ref 65–99)
Glucose-Capillary: 213 mg/dL — ABNORMAL HIGH (ref 65–99)
Glucose-Capillary: 213 mg/dL — ABNORMAL HIGH (ref 65–99)
Glucose-Capillary: 218 mg/dL — ABNORMAL HIGH (ref 65–99)

## 2015-11-14 MED ORDER — MOMETASONE FURO-FORMOTEROL FUM 200-5 MCG/ACT IN AERO
2.0000 | INHALATION_SPRAY | Freq: Two times a day (BID) | RESPIRATORY_TRACT | Status: DC
  Administered 2015-11-14 – 2015-11-15 (×2): 2 via RESPIRATORY_TRACT
  Filled 2015-11-14: qty 8.8

## 2015-11-14 MED ORDER — INSULIN ASPART 100 UNIT/ML ~~LOC~~ SOLN
0.0000 [IU] | Freq: Three times a day (TID) | SUBCUTANEOUS | Status: DC
  Administered 2015-11-15: 3 [IU] via SUBCUTANEOUS

## 2015-11-14 MED ORDER — INSULIN ASPART 100 UNIT/ML ~~LOC~~ SOLN
3.0000 [IU] | Freq: Once | SUBCUTANEOUS | Status: AC
  Administered 2015-11-14: 3 [IU] via SUBCUTANEOUS

## 2015-11-14 MED ORDER — PREDNISONE 20 MG PO TABS
30.0000 mg | ORAL_TABLET | Freq: Every day | ORAL | Status: DC
  Administered 2015-11-15: 30 mg via ORAL
  Filled 2015-11-14 (×2): qty 1

## 2015-11-14 MED ORDER — ALBUTEROL SULFATE (2.5 MG/3ML) 0.083% IN NEBU: 2.5000 mg | INHALATION_SOLUTION | Freq: Three times a day (TID) | RESPIRATORY_TRACT | Status: DC

## 2015-11-14 NOTE — Progress Notes (Signed)
Notified physician that Central Telemetry called to report patient had 12 beats Vtach.  Patient up in chair, denies chest pain, and is asymptomatic.

## 2015-11-14 NOTE — Progress Notes (Signed)
Patient Demographics  [name], is a 82 y.o. male, DOB - 1924-03-14, [id number]  Admit date - 11/09/2015   Admitting Physician Norval Morton, MD  Outpatient Primary MD for the patient is  Melinda Crutch, MD  LOS - 5   Chief Complaint  Patient presents with  . Pneumonia       Admission HPI/Brief narrative: 82 year old male with history of  COPD, CKD stage III, HTN, ischemic cardiomyopathy, systolic CHF last EF [id number] in 10/2015, multiple admission over the last month for respiratory failure secondary to a CPAP, COPD and CHF exacerbation, presents from a facility for complaints of cough on shortness of breath chest x-ray significant for pneumonia, as well blood culture And sputum culture growing Escherichia coli, Subjective:   [name] today has, No headache, No chest pain, No abdominal pain , dyspnea is improving, reports cough,Remains productive . Assessment & Plan    Principal Problem:   HCAP (healthcare-associated pneumonia) Active Problems:   Acute on chronic combined systolic and diastolic congestive heart failure, NYHA class 3 (HCC)   Anemia, iron deficiency   COPD (chronic obstructive pulmonary disease) (HCC)   Essential hypertension   Pressure ulcer   GERD (gastroesophageal reflux disease)   E coli bacteremia  E coli HCAP - Multiple recent hospitalizations, treated for pneumonia in the past, his x-ray with evidence of pneumonia,Treated initially with IV vancomycin and Zosyn, sputum culture growing Escherichia coli, will transition to IV Rocephin. - CT chest was obtained with evidence of multilobar pneumonia, needs repeat CT chest without contrast for follow-up on resolution.  Escherichia coli bacteremia, UTI - Blood culture growing Escherichia coli, initially on IV cefepime , narrowed to IV Rocephin 3/31 given sensitivities. - Follow on urine culture - Follow on the repeat blood  cultures 3/30: Remains with no growth to date  COPD with mild exacerbation - Patient with mild wheezing on physical exam, continue with nebs, home medication including Spiriva, as well continue with desonide and Brovana. - We'll taper steroids from 40 mg to 30 mg oral today.  Acute on chronic systolic CHF - Patient's EF 40-45% on last echocardiogram done in 10/2015. Patient reporting increased leg swelling. Physical exam reveals 2+ pitting edema bilateral lower extremities - Strict ins and outs and daily weights - TED hose -Continue with by mouth Lasix - Continue aspirin, Beta blocker and losartan  Anemia of iron deficiency and chronic disease - patient with known chronic iron deficiency anemia, and chronic illness, baseline hemoglobin 7.5-8.5 over last few weeks, he is Hemoccult positive, most likely related to iron supplements, as no evidence of bleed has has normal color stools and his hemoglobin remained stable. - Discussed with [facility], endoscopy 2009 with no acute finding, colonoscopy 2005 with benign polyps, at this point patient is not a candidate for any invasive workup giving his multiple comorbidities. - Continue with iron supplement,received  one-time IV Feraheme.  Hyperglycemia:  -Suspected to be steroid-induced however patient blood glucose on arrival is 293. Previously seen have elevated hemoglobin A1c of 6.6 back in 2012  - follow on  hemoglobin A1c is 7.3 - continue with SSI  Pressure ulcer  - Bilateral heel ulcers Stage 1 pressure injuries, Coccyx with 2, Stage 2 pressure injuries - Wound  care consult appreciated   Essential hypertension - Continue losartan, Coreg, blood pressure on the lower side, continue to monitor  Jerrye Bushy - Protonix    Code Status: full  Family Communication: Son at bedside  Disposition Plan:SNF placement in a.m.  Procedures  none   Consults   none   Medications  Scheduled Meds: . acidophilus  2 capsule Oral Daily  .  albuterol  2.5 mg Nebulization TID  . arformoterol  15 mcg Nebulization BID  . aspirin EC  81 mg Oral Daily  . budesonide (PULMICORT) nebulizer solution  0.5 mg Nebulization BID  . carvedilol  6.25 mg Oral BID WC  . cefTRIAXone (ROCEPHIN)  IV  2 g Intravenous Daily  . enoxaparin (LOVENOX) injection  40 mg Subcutaneous QHS  . ferrous sulfate  325 mg Oral BID WC  . furosemide  40 mg Oral Daily  . guaiFENesin  600 mg Oral BID  . insulin aspart  0-9 Units Subcutaneous 6 times per day  . Linaclotide  145 mcg Oral Q breakfast  . losartan  25 mg Oral Daily  . magic mouthwash  5 mL Oral QID  . metoCLOPramide  5 mg Oral TID AC  . pantoprazole  40 mg Oral Daily  . polyethylene glycol  17 g Oral BID  . predniSONE  40 mg Oral Q breakfast  . pregabalin  75 mg Oral BID  . senna-docusate  1 tablet Oral BID  . simvastatin  10 mg Oral q1800  . sodium chloride flush  3 mL Intravenous Q12H  . tiotropium  18 mcg Inhalation Daily   Continuous Infusions:  PRN Meds:.sodium chloride, acetaminophen, albuterol, benzonatate, nitroGLYCERIN, ondansetron (ZOFRAN) IV, sodium chloride flush  DVT Prophylaxis  Lovenox   Lab Results  Component Value Date   PLT 111* 11/13/2015    Antibiotics    Anti-infectives    Start     Dose/Rate Route Frequency Ordered Stop   11/12/15 1100  cefTRIAXone (ROCEPHIN) 2 g in dextrose 5 % 50 mL IVPB     2 g 100 mL/hr over 30 Minutes Intravenous Daily 11/12/15 0934     11/11/15 1000  ceFEPIme (MAXIPIME) 1 g in dextrose 5 % 50 mL IVPB  Status:  Discontinued     1 g 100 mL/hr over 30 Minutes Intravenous Every 8 hours 11/11/15 0709 11/12/15 0934   11/10/15 1400  ceFEPIme (MAXIPIME) 1 g in dextrose 5 % 50 mL IVPB  Status:  Discontinued     1 g 100 mL/hr over 30 Minutes Intravenous Every 12 hours 11/10/15 0921 11/11/15 0709   11/10/15 0600  aztreonam (AZACTAM) 1 g in dextrose 5 % 50 mL IVPB  Status:  Discontinued     1 g 100 mL/hr over 30 Minutes Intravenous 3 times per day  11/10/15 0028 11/10/15 0921   11/09/15 2115  vancomycin (VANCOCIN) IVPB 1000 mg/200 mL premix  Status:  Discontinued     1,000 mg 200 mL/hr over 60 Minutes Intravenous Every 24 hours 11/09/15 2108 11/12/15 0934   11/09/15 2100  aztreonam (AZACTAM) 2 g in dextrose 5 % 50 mL IVPB     2 g 100 mL/hr over 30 Minutes Intravenous  Once 11/09/15 2045 11/09/15 2147          Objective:   Filed Vitals:   11/13/15 2159 11/14/15 0452 11/14/15 0504 11/14/15 0826  BP:  130/44    Pulse:  65  69  Temp:  97.2 F (36.2 C)    TempSrc:  Oral  Resp:  16  20  Height:      Weight:   74.617 kg (164 lb 8 oz)   SpO2: 98% 100%  96%    Wt Readings from Last 3 Encounters:  11/14/15 74.617 kg (164 lb 8 oz)  11/09/15 75.864 kg (167 lb 4 oz)  11/05/15 75.864 kg (167 lb 4 oz)     Intake/Output Summary (Last 24 hours) at 11/14/15 1102 Last data filed at 11/14/15 0820  Gross per 24 hour  Intake   1200 ml  Output    860 ml  Net    340 ml     Physical Exam  Awake Alert, Oriented X 3,  Supple Neck,No JVD, No cervical lymphadenopathy appriciated.  Symmetrical Chest wall movement, bilateral wheezing, rhonchi, RRR,No Gallops,Rubs or new Murmurs, No Parasternal Heave +ve B.Sounds, Abd Soft, No tenderness, No organomegaly appriciated, No rebound - guarding or rigidity. No Cyanosis, Clubbing + bilateral edema,   Data Review   Micro Results Recent Results (from the past 240 hour(s))  Culture, blood (routine x 2)     Status: None   Collection Time: 11/09/15  7:40 PM  Result Value Ref Range Status   Specimen Description BLOOD LEFT ANTECUBITAL  Final   Special Requests BOTTLES DRAWN AEROBIC AND ANAEROBIC 10 CC  Final   Culture  Setup Time   Final    GRAM NEGATIVE RODS ANAEROBIC BOTTLE ONLY CRITICAL RESULT CALLED TO, READ BACK BY AND VERIFIED WITH: E. CAUDLE,RN AT [id number] ON [id number] BY S. YARBROUGH    Culture   Final    ESCHERICHIA COLI SUSCEPTIBILITIES PERFORMED ON PREVIOUS CULTURE WITHIN THE LAST  5 DAYS. Performed at [facility]    Report Status 11/12/2015 FINAL  Final  Culture, blood (routine x 2)     Status: None   Collection Time: 11/09/15  7:44 PM  Result Value Ref Range Status   Specimen Description BLOOD RIGHT ANTECUBITAL  Final   Special Requests BOTTLES DRAWN AEROBIC AND ANAEROBIC 10 CC  Final   Culture  Setup Time   Final    GRAM NEGATIVE RODS IN BOTH AEROBIC AND ANAEROBIC BOTTLES CRITICAL RESULT CALLED TO, READ BACK BY AND VERIFIED WITH: E. CAUDLE,RN AT [id number] ON [id number] BY Rhea Bleacher    Culture   Final    ESCHERICHIA COLI Performed at [facility]    Report Status 11/12/2015 FINAL  Final   Organism ID, Bacteria ESCHERICHIA COLI  Final      Susceptibility   Escherichia coli - MIC*    AMPICILLIN >=32 RESISTANT Resistant     CEFAZOLIN <=4 SENSITIVE Sensitive     CEFEPIME <=1 SENSITIVE Sensitive     CEFTAZIDIME <=1 SENSITIVE Sensitive     CEFTRIAXONE <=1 SENSITIVE Sensitive     CIPROFLOXACIN 1 SENSITIVE Sensitive     GENTAMICIN <=1 SENSITIVE Sensitive     IMIPENEM <=0.25 SENSITIVE Sensitive     TRIMETH/SULFA >=320 RESISTANT Resistant     AMPICILLIN/SULBACTAM 16 INTERMEDIATE Intermediate     PIP/TAZO <=4 SENSITIVE Sensitive     * ESCHERICHIA COLI  Urine culture     Status: None   Collection Time: 11/10/15 12:06 AM  Result Value Ref Range Status   Specimen Description URINE, CLEAN CATCH  Final   Special Requests NONE  Final   Culture   Final    60,000 COLONIES/ml YEAST Performed at [facility]    Report Status 11/11/2015 FINAL  Final  Culture, sputum-assessment     Status: None  Collection Time: 11/10/15 12:06 AM  Result Value Ref Range Status   Specimen Description SPUTUM  Final   Special Requests NONE  Final   Sputum evaluation   Final    THIS SPECIMEN IS ACCEPTABLE. RESPIRATORY CULTURE REPORT TO FOLLOW.   Report Status 11/10/2015 FINAL  Final  Culture, respiratory (NON-Expectorated)     Status: None   Collection Time:  11/10/15 12:06 AM  Result Value Ref Range Status   Specimen Description SPUTUM  Final   Special Requests NONE  Final   Gram Stain   Final    MODERATE WBC PRESENT,BOTH PMN AND MONONUCLEAR FEW SQUAMOUS EPITHELIAL CELLS PRESENT ABUNDANT GRAM NEGATIVE RODS MODERATE GRAM POSITIVE COCCI IN PAIRS IN CLUSTERS FEW YEAST THIS SPECIMEN IS ACCEPTABLE FOR SPUTUM CULTURE Performed at [facility]    Culture   Final    ABUNDANT ESCHERICHIA COLI Performed at [facility]    Report Status 11/12/2015 FINAL  Final   Organism ID, Bacteria ESCHERICHIA COLI  Final      Susceptibility   Escherichia coli - MIC*    AMPICILLIN >=32 RESISTANT Resistant     AMPICILLIN/SULBACTAM 16 INTERMEDIATE Intermediate     CEFAZOLIN <=4 SENSITIVE Sensitive     CEFEPIME <=1 SENSITIVE Sensitive     CEFTAZIDIME <=1 SENSITIVE Sensitive     CEFTRIAXONE <=1 SENSITIVE Sensitive     CIPROFLOXACIN 1 SENSITIVE Sensitive     GENTAMICIN <=1 SENSITIVE Sensitive     IMIPENEM <=0.25 SENSITIVE Sensitive     PIP/TAZO <=4 SENSITIVE Sensitive     TOBRAMYCIN <=1 SENSITIVE Sensitive     TRIMETH/SULFA Value in next row Resistant      >=320 RESISTANT(NOTE)    * ABUNDANT ESCHERICHIA COLI  Culture, blood (Routine X 2) w Reflex to ID Panel     Status: None (Preliminary result)   Collection Time: 11/11/15  2:06 PM  Result Value Ref Range Status   Specimen Description BLOOD LEFT HAND  Final   Special Requests BOTTLES DRAWN AEROBIC ONLY [place]  Final   Culture   Final    NO GROWTH 2 DAYS Performed at [facility]    Report Status PENDING  Incomplete  Culture, blood (Routine X 2) w Reflex to ID Panel     Status: None (Preliminary result)   Collection Time: 11/11/15  2:09 PM  Result Value Ref Range Status   Specimen Description BLOOD RIGHT HAND  Final   Special Requests BOTTLES DRAWN AEROBIC AND ANAEROBIC [place]  Final   Culture   Final    NO GROWTH 2 DAYS Performed at [facility]    Report Status PENDING   Incomplete    Radiology Reports Dg Chest 2 View  11/09/2015  CLINICAL DATA:  Shortness of breath. EXAM: CHEST  2 VIEW COMPARISON:  10/28/2015 chest radiograph. FINDINGS: Surgical clips in the medial lower right neck. Stable cardiomediastinal silhouette with normal heart size. No pneumothorax. No pleural effusion. Patchy consolidation throughout both lower lobes. No pulmonary edema. IMPRESSION: Patchy bilateral lower lobe consolidation, most suggestive of multilobar pneumonia. Recommend follow-up PA and lateral post treatment chest radiographs in 4-6 weeks. Electronically Signed   By: Ilona Sorrel M.D.   On: 11/09/2015 20:38   Dg Chest 2 View  10/25/2015  CLINICAL DATA:  Shortness of breath and cough EXAM: CHEST  2 VIEW COMPARISON:  10/24/15 FINDINGS: Cardiac shadow is stable. The lungs are well aerated bilaterally. Small effusions are again noted. No focal confluent infiltrate is seen. IMPRESSION: Small  bilateral pleural effusions. Improved aeration in the bases is noted. Electronically Signed   By: Inez Catalina M.D.   On: 10/25/2015 10:07   Dg Chest 2 View  10/24/2015  CLINICAL DATA:  Productive cough for 2-3 days. EXAM: CHEST - 2 VIEW COMPARISON:  Two-view chest x-ray 09/20/2015. FINDINGS: The heart size is normal. Small bilateral pleural effusions are now present. Mild bibasilar airspace opacities are evident posteriorly. The upper lung fields are clear. Emphysematous changes are again noted. Atherosclerotic calcifications are present in the aortic arch. Surgical clips are present in the right neck and at the gallbladder fossa. IMPRESSION: 1. New bilateral pleural effusions and posterior lower lobe airspace disease. While this may represent atelectasis, infection is not excluded. 2. Emphysema. 3. Atherosclerosis of the thoracic aorta. Electronically Signed   By: San Morelle M.D.   On: 10/24/2015 13:38   Ct Chest Wo Contrast  11/10/2015  CLINICAL DATA:  Shortness of breath and cough EXAM: CT  CHEST WITHOUT CONTRAST TECHNIQUE: Multidetector CT imaging of the chest was performed following the standard protocol without IV contrast. COMPARISON:  11/09/2015 FINDINGS: The lungs are well aerated bilaterally. Patchy infiltrates are seen bilaterally worst in the right middle and right lower lobes consistent with acute pneumonia. Some scattered more nodular appearing infiltrates are seen as well. One of these nodular areas measures approximately 6 mm in the right upper lobe best seen on image number 30 of series 5. Second somewhat nodular area is noted on the left measuring 12 mm is in dimension. This is best seen on image number 27 of series 5. A small right-sided pleural effusion is seen. The thoracic inlet is within normal limits. Aortic calcifications are noted. No significant hilar or mediastinal adenopathy is seen. Some calcification in the left ventricular wall is noted likely related to prior infarct. The visualized upper abdomen shows changes of prior cholecystectomy. A left renal cyst is noted as well. Degenerative changes of the thoracic spine are noted. IMPRESSION: Infiltrative changes in both lungs worst in the right middle and right lower lobes. Some nodularity is noted in scattered areas throughout both lungs. This all again likely represents an acute pneumonic process. Short-term followup following appropriate therapy with noncontrast CT of the chest is recommended to assess for resolution or stability. This could be performed in 4-6 weeks Electronically Signed   By: Inez Catalina M.D.   On: 11/10/2015 12:44   Dg Chest Port 1 View  10/28/2015  CLINICAL DATA:  COPD exacerbation.  Short of breath EXAM: PORTABLE CHEST 1 VIEW COMPARISON:  10/26/2015 FINDINGS: Endotracheal tube and NG tubes have been removed. Bilateral airspace disease in the bases left greater than right shows interval improvement. Minimal pleural effusion bilaterally. IMPRESSION: Interval improvement in bilateral airspace disease  consistent with clearing edema. Endotracheal tube removed in the interval. Electronically Signed   By: Franchot Gallo M.D.   On: 10/28/2015 07:33   Dg Chest Port 1 View  10/26/2015  CLINICAL DATA:  Intubation EXAM: PORTABLE CHEST 1 VIEW COMPARISON:  Yesterday FINDINGS: New endotracheal tube with tip between the clavicular heads and carina. An orogastric tube reaches stomach at least. Diffuse interstitial opacities are improved. Kerley lines are still noted. There is asymmetric airspace disease to the left. No definitive effusion. When accounting for a skin fold on the left there is no visible pneumothorax. IMPRESSION: 1. New endotracheal and orogastric tubes are in good position. 2. Pulmonary edema with improvement since earlier. 3. Airspace disease which could be asymmetric alveolar edema  or superimposed pneumonia. Electronically Signed   By: Monte Fantasia M.D.   On: 10/26/2015 02:00   Dg Chest Port 1 View  10/26/2015  CLINICAL DATA:  Acute onset of shortness of breath. Initial encounter. EXAM: PORTABLE CHEST 1 VIEW COMPARISON:  Chest radiograph performed earlier today at 9:24 a.m. FINDINGS: The lungs are hyperexpanded. The lung bases are incompletely imaged on this study. Vascular congestion is noted. Bibasilar airspace opacities likely reflect pulmonary edema, significantly worsened from the recent prior study, though pneumonia could have a similar appearance. No definite pleural effusion or pneumothorax is seen. The cardiomediastinal silhouette is within normal limits. No acute osseous abnormalities are seen. IMPRESSION: Lungs hypoexpanded. Vascular congestion noted. Bibasilar airspace opacities likely reflect pulmonary edema, significantly worsened from the prior study, though pneumonia could have a similar appearance. Electronically Signed   By: Garald Balding M.D.   On: 10/26/2015 00:06   Dg Abd 2 Views  10/29/2015  CLINICAL DATA:  Partial small bowel obstruction. EXAM: ABDOMEN - 2 VIEW COMPARISON:   10/26/2015 FINDINGS: Nasogastric tube no longer visualized. Gaseous dilatation of small bowel remains with some air also present throughout most of the colon. Dilatation of small bowel slightly worse compared to 3/14. Findings remain consistent with partial small bowel obstruction versus generalized ileus. Decubitus film shows no evidence of free intraperitoneal air. No abnormal calcifications identified. IMPRESSION: Slight worsening of partial small bowel obstruction versus ileus. Electronically Signed   By: Aletta Edouard M.D.   On: 10/29/2015 11:39   Dg Abd 2 Views  10/17/2015  CLINICAL DATA:  Follow-up small bowel obstruction EXAM: ABDOMEN - 2 VIEW COMPARISON:  10/16/2015 FINDINGS: Oral contrast is seen primarily throughout the large bowel with air-fluid levels throughout the large bowel. Contrast is seen into the distal sigmoid. Large bowel is relatively decompressed as compared to small bowel, with numerous distended loops of mid abdominal small bowel again identified measuring up to about 4.5 cm in diameter. Small bowel air-fluid levels are stable. There is opacity at the left lung base most consistent with atelectasis. IMPRESSION: Stable findings of small bowel obstruction Electronically Signed   By: Skipper Cliche M.D.   On: 10/17/2015 08:52   Dg Abd Portable 1v  10/26/2015  CLINICAL DATA:  Followup partial small bowel obstruction. EXAM: PORTABLE ABDOMEN - 1 VIEW COMPARISON:  10/20/2015 and earlier, including CT abdomen and pelvis 10/15/2015. FINDINGS: Persisting gaseous distention of multiple loops of small bowel in the mid abdomen. Gas and stool in normal caliber colon. Nasogastric tube looped in the stomach with its tip in the distal body. No suggestion of free air on the supine image. Prior cholecystectomy. Developing airspace opacities in both lung bases. IMPRESSION: 1. No significant change since yesterday in the partial small bowel obstruction. 2. Developing pneumonia in both lung bases.  Electronically Signed   By: Evangeline Dakin M.D.   On: 10/26/2015 07:58   Dg Abd Portable 1v  10/20/2015  CLINICAL DATA:  Followup small bowel obstruction. EXAM: PORTABLE ABDOMEN - 1 VIEW COMPARISON:  10/17/2015.  CT, 10/15/2015. FINDINGS: Mild dilation of small bowel is seen centrally. There is air in stool in the colon as well as some contrast in the left colon. The degree of small bowel dilation is mildly decreased from the prior exam. No new abnormalities. IMPRESSION: 1. Mild improvement in the partial small bowel obstruction with less small bowel dilation noted on the current study. Electronically Signed   By: Lajean Manes M.D.   On: 10/20/2015 11:56  Dg Abd Portable 1v  10/16/2015  CLINICAL DATA:  Small bowel obstruction. EXAM: PORTABLE ABDOMEN - 1 VIEW COMPARISON:  CT scan October 15, 2015 FINDINGS: There is contrast within the decompressed colon. Dilated loops of small bowel are seen diffusely, most prominent in the central and left abdomen. No free air, portal venous gas, or pneumatosis identified. No other acute abnormalities. IMPRESSION: Small bowel obstruction. Electronically Signed   By: Dorise Bullion III M.D   On: 10/16/2015 09:24     CBC  Recent Labs Lab 11/09/15 1923 11/10/15 [id number] 11/11/15 0524 11/12/15 0523 11/13/15 0537  WBC 8.6 10.1 8.3 8.9 8.1  HGB 8.5* 8.5* 7.5* 7.4* 7.7*  HCT 27.7* 27.5* 23.5* 23.4* 25.2*  PLT 110* 121* 103* 92* 111*  MCV 61.7* 60.6* 61.2* 60.6* 60.1*  MCH 18.9* 18.7* 19.5* 19.2* 18.4*  MCHC 30.7 30.9 31.9 31.6 30.6  RDW 16.7* 16.6* 16.4* 16.2* 16.1*  LYMPHSABS 0.3* 0.8  --   --   --   MONOABS 0.3 0.4  --   --   --   EOSABS 0.0 0.1  --   --   --   BASOSABS 0.0 0.0  --   --   --     Chemistries   Recent Labs Lab 11/09/15 1923 11/10/15 0508 11/11/15 0524 11/12/15 0523 11/13/15 0537  NA 141 143 137 139 141  K 3.9 3.4* 3.5 3.7 3.3*  CL 104 104 101 102 100*  CO2 28 30 29 29  32  GLUCOSE 293* 109* 163* 157* 144*  BUN 32* 25* 28* 32* 33*   CREATININE 1.29* 1.07 0.91 1.38* 1.10  CALCIUM 8.4* 8.3* 8.0* 8.2* 8.3*  AST 14*  --   --   --   --   ALT 16*  --   --   --   --   ALKPHOS 60  --   --   --   --   BILITOT 0.4  --   --   --   --    ------------------------------------------------------------------------------------------------------------------ estimated creatinine clearance is 48 mL/min (by C-G formula based on Cr of 1.1). ------------------------------------------------------------------------------------------------------------------ No results for input(s): HGBA1C in the last 72 hours. ------------------------------------------------------------------------------------------------------------------ No results for input(s): CHOL, HDL, LDLCALC, TRIG, CHOLHDL, LDLDIRECT in the last 72 hours. ------------------------------------------------------------------------------------------------------------------ No results for input(s): TSH, T4TOTAL, T3FREE, THYROIDAB in the last 72 hours.  Invalid input(s): FREET3 ------------------------------------------------------------------------------------------------------------------  Recent Labs  11/11/15 1409 11/11/15 1422  VITAMINB12 581  --   FOLATE  --  6.6  FERRITIN 122  --   TIBC 186*  --   IRON 33*  --     Coagulation profile No results for input(s): INR, PROTIME in the last 168 hours.  No results for input(s): DDIMER in the last 72 hours.  Cardiac Enzymes  Recent Labs Lab 11/09/15 1923  TROPONINI 0.03   ------------------------------------------------------------------------------------------------------------------ Invalid input(s): POCBNP     Time Spent in minutes   25 minutes   Dove Gresham M.D on 11/14/2015 at 11:02 AM  Between 7am to 7pm - Pager - [phone]  After 7pm go to [email or web address] - password [facility]  [facility]   Office  [phone]

## 2015-11-14 NOTE — Progress Notes (Signed)
CSW emailed bed offers to pt dtr, [name].  CSW informed of probably DC tomorrow and that we would need to have answer by tomorrow morning in order to initiate [facility] auth.  Cindy expressed understanding and will follow up with bed offer  CSW will continue to follow  Domenica Reamer, Anthony Worker [phone]

## 2015-11-15 LAB — GLUCOSE, CAPILLARY
Glucose-Capillary: 116 mg/dL — ABNORMAL HIGH (ref 65–99)
Glucose-Capillary: 231 mg/dL — ABNORMAL HIGH (ref 65–99)

## 2015-11-15 LAB — CBC
HCT: 26.8 % — ABNORMAL LOW (ref 39.0–52.0)
Hemoglobin: 8.4 g/dL — ABNORMAL LOW (ref 13.0–17.0)
MCH: 19.2 pg — AB (ref 26.0–34.0)
MCHC: 31.3 g/dL (ref 30.0–36.0)
MCV: 61.3 fL — ABNORMAL LOW (ref 78.0–100.0)
Platelets: 149 10*3/uL — ABNORMAL LOW (ref 150–400)
RBC: 4.37 MIL/uL (ref 4.22–5.81)
RDW: 15.9 % — AB (ref 11.5–15.5)
WBC: 7.7 10*3/uL (ref 4.0–10.5)

## 2015-11-15 LAB — CBC AND DIFFERENTIAL: WBC: 7.7 10*3/mL

## 2015-11-15 LAB — BASIC METABOLIC PANEL
Anion gap: 8 (ref 5–15)
BUN: 28 mg/dL — AB (ref 4–21)
BUN: 28 mg/dL — AB (ref 6–20)
CALCIUM: 8.5 mg/dL — AB (ref 8.9–10.3)
CO2: 34 mmol/L — AB (ref 22–32)
CREATININE: 1.1 mg/dL (ref 0.6–1.3)
CREATININE: 1.14 mg/dL (ref 0.61–1.24)
Chloride: 99 mmol/L — ABNORMAL LOW (ref 101–111)
GFR calc non Af Amer: 55 mL/min — ABNORMAL LOW (ref >60)
GLUCOSE: 136 mg/dL
Glucose, Bld: 136 mg/dL — ABNORMAL HIGH (ref 65–99)
Potassium: 3.5 mmol/L (ref 3.5–5.1)
SODIUM: 141 mmol/L (ref 135–145)
Sodium: 141 mmol/L (ref 137–147)

## 2015-11-15 MED ORDER — MAGIC MOUTHWASH: 5.0000 mL | Freq: Four times a day (QID) | ORAL | Status: AC

## 2015-11-15 MED ORDER — GUAIFENESIN ER 600 MG PO TB12: 600.0000 mg | ORAL_TABLET | Freq: Two times a day (BID) | ORAL | Status: DC

## 2015-11-15 MED ORDER — CIPROFLOXACIN HCL 500 MG PO TABS
500.0000 mg | ORAL_TABLET | Freq: Two times a day (BID) | ORAL | Status: DC
  Administered 2015-11-15: 500 mg via ORAL
  Filled 2015-11-15 (×3): qty 1

## 2015-11-15 MED ORDER — INSULIN GLARGINE 100 UNIT/ML ~~LOC~~ SOLN: 5.0000 [IU] | Freq: Every day | SUBCUTANEOUS | Status: DC

## 2015-11-15 MED ORDER — INSULIN ASPART 100 UNIT/ML ~~LOC~~ SOLN: 0.0000 [IU] | Freq: Three times a day (TID) | SUBCUTANEOUS | Status: DC

## 2015-11-15 MED ORDER — CIPROFLOXACIN HCL 500 MG PO TABS: 500.0000 mg | ORAL_TABLET | Freq: Two times a day (BID) | ORAL | Status: AC

## 2015-11-15 MED ORDER — DIPHENHYDRAMINE HCL 25 MG PO CAPS
25.0000 mg | ORAL_CAPSULE | Freq: Once | ORAL | Status: AC
  Administered 2015-11-15: 25 mg via ORAL
  Filled 2015-11-15: qty 1

## 2015-11-15 MED ORDER — ALBUTEROL SULFATE (2.5 MG/3ML) 0.083% IN NEBU: 2.5000 mg | INHALATION_SOLUTION | Freq: Four times a day (QID) | RESPIRATORY_TRACT | Status: AC | PRN

## 2015-11-15 MED ORDER — INSULIN GLARGINE 100 UNIT/ML ~~LOC~~ SOLN
5.0000 [IU] | Freq: Every day | SUBCUTANEOUS | Status: DC
  Administered 2015-11-15: 5 [IU] via SUBCUTANEOUS
  Filled 2015-11-15: qty 0.05

## 2015-11-15 MED ORDER — POTASSIUM CHLORIDE CRYS ER 20 MEQ PO TBCR
40.0000 meq | EXTENDED_RELEASE_TABLET | Freq: Once | ORAL | Status: AC
  Administered 2015-11-15: 40 meq via ORAL
  Filled 2015-11-15: qty 2

## 2015-11-15 MED ORDER — FUROSEMIDE 20 MG PO TABS: 40.0000 mg | ORAL_TABLET | Freq: Every day | ORAL | Status: DC

## 2015-11-15 MED ORDER — RISAQUAD PO CAPS: 2.0000 | ORAL_CAPSULE | Freq: Every day | ORAL | Status: DC

## 2015-11-15 MED ORDER — PREDNISONE 10 MG PO TABS: ORAL_TABLET | ORAL | Status: DC

## 2015-11-15 MED ORDER — IPRATROPIUM-ALBUTEROL 0.5-2.5 (3) MG/3ML IN SOLN: 3.0000 mL | Freq: Three times a day (TID) | RESPIRATORY_TRACT | Status: DC

## 2015-11-15 NOTE — Progress Notes (Signed)
[name] to be D/C'd Skilled nursing facility per MD order.  Discussed prescriptions and follow up appointments with the patient. Prescriptions given to patient, medication list explained in detail. Pt verbalized understanding.    Medication List    STOP taking these medications        enoxaparin 40 MG/0.4ML injection  Commonly known as:  LOVENOX     metoCLOPramide 5 MG tablet  Commonly known as:  REGLAN     tiotropium 18 MCG inhalation capsule  Commonly known as:  SPIRIVA     VENTOLIN HFA 108 (90 Base) MCG/ACT inhaler  Generic drug:  albuterol  Replaced by:  albuterol (2.5 MG/3ML) 0.083% nebulizer solution      TAKE these medications        acidophilus Caps capsule  Take 2 capsules by mouth daily.     albuterol (2.5 MG/3ML) 0.083% nebulizer solution  Commonly known as:  PROVENTIL  Take 3 mLs (2.5 mg total) by nebulization every 6 (six) hours as needed for wheezing.     aspirin EC 81 MG tablet  Take 81 mg by mouth daily.     carvedilol 12.5 MG tablet  Commonly known as:  COREG  TAKE 1 TABLET TWICE DAILY WITH A MEAL     ciprofloxacin 500 MG tablet  Commonly known as:  CIPRO  Take 1 tablet (500 mg total) by mouth 2 (two) times daily. Please take for total of 8 days.     ferrous sulfate 325 (65 FE) MG tablet  Take 1 tablet (325 mg total) by mouth 2 (two) times daily with a meal.     furosemide 20 MG tablet  Commonly known as:  LASIX  Take 2 tablets (40 mg total) by mouth daily.     Glycopyrrolate-Formoterol 9-4.8 MCG/ACT Aero  Commonly known as:  BEVESPI AEROSPHERE  Inhale 2 puffs into the lungs every 12 (twelve) hours.     guaifenesin 100 MG/5ML syrup  Commonly known as:  ROBITUSSIN  Take 10 mg by mouth every 4 (four) hours as needed for cough.     guaiFENesin 600 MG 12 hr tablet  Commonly known as:  MUCINEX  Take 1 tablet (600 mg total) by mouth 2 (two) times daily. Please take for 2 weeks.     insulin aspart 100 UNIT/ML injection  Commonly known as:   novoLOG  Inject 0-9 Units into the skin 3 (three) times daily with meals.     insulin glargine 100 UNIT/ML injection  Commonly known as:  LANTUS  Inject 0.05 mLs (5 Units total) into the skin daily.     ipratropium-albuterol 0.5-2.5 (3) MG/3ML Soln  Commonly known as:  DUONEB  Take 3 mLs by nebulization 3 (three) times daily.     LINZESS 145 MCG Caps capsule  Generic drug:  Linaclotide  Take 1 capsule by mouth daily with breakfast.     losartan 25 MG tablet  Commonly known as:  COZAAR  Take 1 tablet (25 mg total) by mouth daily.     magic mouthwash Soln  Take 5 mLs by mouth 4 (four) times daily.     meclizine 25 MG tablet  Commonly known as:  ANTIVERT  Take 1 tablet (25 mg total) by mouth daily as needed for dizziness.     mometasone-formoterol 200-5 MCG/ACT Aero  Commonly known as:  DULERA  Inhale 2 puffs into the lungs 2 (two) times daily.     MYRBETRIQ 50 MG Tb24 tablet  Generic drug:  mirabegron ER  Take 50 mg by mouth daily.     NITROSTAT 0.4 MG SL tablet  Generic drug:  nitroGLYCERIN  PLACE 1 TABLET (0.4 MG TOTAL) UNDER THE TONGUE EVERY 5 (FIVE) MINUTES AS NEEDED. FOR CHEST PAIN     omeprazole 20 MG capsule  Commonly known as:  PRILOSEC  Take 40 mg by mouth daily.     polyethylene glycol packet  Commonly known as:  MIRALAX / GLYCOLAX  Take 17 g by mouth 2 (two) times daily.     predniSONE 10 MG tablet  Commonly known as:  DELTASONE  Please take 3 tablets for 3 days, then 2 tablets for 3 days, then 1 tablet for 3 days, then stop     pregabalin 75 MG capsule  Commonly known as:  LYRICA  Take 75 mg by mouth 2 (two) times daily.     senna-docusate 8.6-50 MG tablet  Commonly known as:  Senokot-S  Take 1 tablet by mouth 2 (two) times daily.     simvastatin 10 MG tablet  Commonly known as:  ZOCOR  Take 10 mg by mouth daily at 6 PM.     TYLENOL 325 MG tablet  Generic drug:  acetaminophen  Take 650 mg by mouth every 6 (six) hours as needed for mild pain.         Filed Vitals:   11/14/15 2100 11/15/15 0440  BP: 107/57 108/47  Pulse: 70 73  Temp: 97.3 F (36.3 C) 97.5 F (36.4 C)  Resp: 18 20    Skin clean, dry and intact without evidence of skin break down, no evidence of skin tears noted. IV catheter discontinued intact. Site without signs and symptoms of complications. Dressing and pressure applied. Pt denies pain at this time. No complaints noted.  An After Visit Summary was printed and given to the patient. Patient escorted via [place], and D/C home via private auto.  Lolita Rieger 11/15/2015 2:49 PM

## 2015-11-15 NOTE — Progress Notes (Signed)
Physical Therapy Treatment Patient Details Name: [name] MRN: [id number] DOB: 07-03-1924 Today's Date: 11/15/2015    History of Present Illness 82 yo male admitted with HCAP and has had multiple admission over the last month for respiratory failure secondary to a CPAP, COPD and CHF exacerbation. Hx of COPD, CAD, CVA, prostate cancer, anemia, MI, bil foot drop, neuropathy, vertigo    PT Comments    Pt in bed on 2 lts had just finished a breathing Tx by RT.  Assisted OOB to amb to bathroom only due to max c/o fatigue and weakness.  Pt required increased time and assist due to gait instability.  Trial RA decreased to 77% so reapplied 2 lts nasal to achieve 92%.  Assisted out of bathroom to amb to recliner and assisted with breakfast set up.  Pt demonstrates limited activity tolerance, weakness and gait instability.  HIGH FALL RISK.   Pt will need ST Rehab at SNF.  SATURATION QUALIFICATIONS: (This note is used to comply with regulatory documentation for home oxygen)  Patient Saturations on Room Air at Rest = 88%  Patient Saturations on Room Air while Ambulating = 77%  Patient Saturations on 2 Liters of oxygen while Ambulating = 92%  Please briefly explain why patient needs home oxygen:  Pt required supplemental oxygen to achieve therapeutic level  Follow Up Recommendations  SNF     Equipment Recommendations       Recommendations for Other Services       Precautions / Restrictions Precautions Precautions: Fall Precaution Comments: bil LE AFO shoes (not seen in room this admission)   HOH Restrictions Weight Bearing Restrictions: No    Mobility  Bed Mobility Overal bed mobility: Needs Assistance Bed Mobility: Supine to Sit     Supine to sit: Min guard     General bed mobility comments: increased time, HOB elevated,  B LE edema  Transfers Overall transfer level: Needs assistance Equipment used: Rolling walker (2 wheeled) Transfers: Sit to/from Stand Sit to Stand: Min  assist;Mod assist         General transfer comment: 25% VC's on proper hand placement and increased time.  Max c/o weakness.  Ambulation/Gait Ambulation/Gait assistance: Min assist;Mod assist Ambulation Distance (Feet): 16 Feet (8 feet x 2 to and from bathroom only) Assistive device: Rolling walker (2 wheeled) Gait Pattern/deviations: Step-to pattern;Step-through pattern;Trunk flexed Gait velocity: decreased   General Gait Details: slow gait with limited activity tolerance.  Noted cough/congestion.  No AFO's present.  Max c/o weakness.  RA decreased to 77% so reapplied 2 lts to achieve 92%.     Stairs            Wheelchair Mobility    Modified Rankin (Stroke Patients Only)       Balance                                    Cognition Arousal/Alertness: Awake/alert Behavior During Therapy: WFL for tasks assessed/performed Overall Cognitive Status: Within Functional Limits for tasks assessed                      Exercises      General Comments        Pertinent Vitals/Pain Pain Assessment: No/denies pain    Home Living                      Prior Function  PT Goals (current goals can now be found in the care plan section) Progress towards PT goals: Progressing toward goals    Frequency  Min 3X/week    PT Plan Current plan remains appropriate    Co-evaluation             End of Session Equipment Utilized During Treatment: Gait belt;Oxygen Activity Tolerance: Patient limited by fatigue Patient left: in chair;with call bell/phone within reach;with chair alarm set     Time: 1020-1045 PT Time Calculation (min) (ACUTE ONLY): 25 min  Charges:  $Gait Training: 8-22 mins $Therapeutic Activity: 8-22 mins                    G Codes:      Rica Koyanagi  PTA [facility]  Acute  Rehab Pager      [phone]

## 2015-11-15 NOTE — Clinical Social Work Placement (Signed)
   CLINICAL SOCIAL WORK PLACEMENT  NOTE  Date:  11/15/2015  Patient Details  Name: [name] MRN: [id number] Date of Birth: 25-Aug-1923  Clinical Social Work is seeking post-discharge placement for this patient at the [place] level of care (*CSW will initial, date and re-position this form in  chart as items are completed):      Patient/family provided with [place] Work Department's list of facilities offering this level of care within the geographic area requested by the patient (or if unable, by the patient's family).  Yes   Patient/family informed of their freedom to choose among providers that offer the needed level of care, that participate in Medicare, Medicaid or managed care program needed by the patient, have an available bed and are willing to accept the patient.  Yes   Patient/family informed of [facility]'s ownership interest in [facility] and [facility], as well as of the fact that they are under no obligation to receive care at these facilities.  PASRR submitted to EDS on       PASRR number received on       Existing PASRR number confirmed on 11/10/15     FL2 transmitted to all facilities in geographic area requested by pt/family on 11/10/15     FL2 transmitted to all facilities within larger geographic area on       Patient informed that his/her managed care company has contracts with or will negotiate with certain facilities, including the following:            Patient/family informed of bed offers received.  Patient chooses bed at [facility]     Physician recommends and patient chooses bed at      Patient to be transferred to [facility] on 11/15/15.  Patient to be transferred to facility by [facility]     Patient family notified on 11/15/15 of transfer.  Name of family member notified:  DAUGHTER     PHYSICIAN       Additional Comment: Pt / daughter are in agreement with d/c to [facility] today. PT approved  transport by car. [employer] medic[employer] has authorized SNF placement. D/C Summary has been sent to SNF. # for report has been provided to nsg for report.   _______________________________________________ Luretha Rued, [place] 11/15/2015, 2:12 PM

## 2015-11-15 NOTE — Care Management Note (Signed)
Case Management Note  Patient Details  Name: [name] MRN: [id number] Date of Birth: Nov 04, 1923  Subjective/Objective:                    Action/Plan:d/c SNF   Expected Discharge Date:                 Expected Discharge Plan:  Skilled Nursing Facility  In-House Referral:  Clinical Social Work  Discharge planning Services  CM Consult  Post Acute Care Choice:    Choice offered to:     DME Arranged:    DME Agency:     HH Arranged:    [place] Agency:     Status of Service:  Completed, signed off  Medicare Important Message Given:  Yes Date Medicare IM Given:    Medicare IM give by:    Date Additional Medicare IM Given:    Additional Medicare Important Message give by:     If discussed at [place] of Stay Meetings, dates discussed:    Additional Comments:  Dessa Phi, RN 11/15/2015, 1:09 PM

## 2015-11-15 NOTE — Discharge Instructions (Signed)
Follow with Primary MD  Melinda Crutch, MD after discharge from SNF  Get CBC, CMP, 2 view Chest X ray checked  by Primary MD next visit.    Activity: As tolerated with Full fall precautions use walker/cane & assistance as needed   Disposition SNF   Diet: Heart Healthy , carbohydrate modified , with feeding assistance and aspiration precautions.  For Heart failure patients - Check your Weight same time everyday, if you gain over 2 pounds, or you develop in leg swelling, experience more shortness of breath or chest pain, call your Primary MD immediately. Follow Cardiac Low Salt Diet and 1.5 lit/day fluid restriction.   On your next visit with your primary care physician please Get Medicines reviewed and adjusted.   Please request your Prim.MD to go over all Hospital Tests and Procedure/Radiological results at the follow up, please get all Hospital records sent to your Prim MD by signing hospital release before you go home.   If you experience worsening of your admission symptoms, develop shortness of breath, life threatening emergency, suicidal or homicidal thoughts you must seek medical attention immediately by calling 911 or calling your MD immediately  if symptoms less severe.  You Must read complete instructions/literature along with all the possible adverse reactions/side effects for all the Medicines you take and that have been prescribed to you. Take any new Medicines after you have completely understood and accpet all the possible adverse reactions/side effects.   Do not drive, operating heavy machinery, perform activities at heights, swimming or participation in water activities or provide baby sitting services if your were admitted for syncope or siezures until you have seen by Primary MD or a Neurologist and advised to do so again.  Do not drive when taking Pain medications.    Do not take more than prescribed Pain, Sleep and Anxiety Medications  Special Instructions: If you  have smoked or chewed Tobacco  in the last 2 yrs please stop smoking, stop any regular Alcohol  and or any Recreational drug use.  Wear Seat belts while driving.   Please note  You were cared for by a hospitalist during your hospital stay. If you have any questions about your discharge medications or the care you received while you were in the hospital after you are discharged, you can call the unit and asked to speak with the hospitalist on call if the hospitalist that took care of you is not available. Once you are discharged, your primary care physician will handle any further medical issues. Please note that NO REFILLS for any discharge medications will be authorized once you are discharged, as it is imperative that you return to your primary care physician (or establish a relationship with a primary care physician if you do not have one) for your aftercare needs so that they can reassess your need for medications and monitor your lab values.

## 2015-11-15 NOTE — Discharge Summary (Signed)
[name], is a 82 y.o. male  DOB 26-Apr-1924  MRN [id number].  Admission date:  11/09/2015  Admitting Physician  Norval Morton, MD  Discharge Date:  11/15/2015   Primary MD   Melinda Crutch, MD  Recommendations for primary care physician for things to follow:  - Please continue with pulmonary toilet, flutter valve and incentive spirometry - Monitor CBC closely and adjust Lantus dose as needed - Repeat 2 view chest x-ray in 2 weeks to ensure resolution of pneumonia, will need to repeat CT chest in 4-6 weeks to ensure resolution of pneumonia as well.   Admission Diagnosis  HCAP (healthcare-associated pneumonia) [J18.9]   Discharge Diagnosis  HCAP (healthcare-associated pneumonia) [J18.9]    Principal Problem:   HCAP (healthcare-associated pneumonia) Active Problems:   Acute on chronic combined systolic and diastolic congestive heart failure, NYHA class 3 (HCC)   Anemia, iron deficiency   COPD (chronic obstructive pulmonary disease) (HCC)   Essential hypertension   Pressure ulcer   GERD (gastroesophageal reflux disease)   E coli bacteremia      Past Medical History  Diagnosis Date  . Carotid artery stenosis   . Essential hypertension   . Myocardial infarction ([place]) 1979  . Coronary artery disease     Known occluded proximal LAD with collaterals, moderate RCA disease - managed medically 2009  . COPD (chronic obstructive pulmonary disease) ([place])   . History of stroke   . Prostate cancer ([place])   . Arthritis   . Foot drop, right   . Neuropathy ([place])   . Anemia   . Urinary incontinence, male, stress   . GERD (gastroesophageal reflux disease)   . Thalassemia   . Wears dentures   . Deafness in left ear     Wears hearing aide in right ear  . Sleep apnea     CPAP  . Ischemic cardiomyopathy     LVEF 40-45%  . Hypercholesteremia   . Anemia   . Osteoarthritis   . Stroke ([place])   . Heart attack  ([place])   . Vertigo   . Hereditary and idiopathic peripheral neuropathy 03/01/2015  . Abnormality of gait 03/01/2015    Past Surgical History  Procedure Laterality Date  . Penile prosthesis implant    . Cholecystectomy    . Right carotid endarterectomy    . Back surgery    . Lumbar laminectomy  03/24/10    L3-4 and L4-5  . Ankle surgery      Bone infection  . Cardiac catheterization    . Appendectomy    . Joint replacement Left   . Radioactive seed implant  2009  . Pars plana vitrectomy  03/19/2012    Procedure: PARS PLANA VITRECTOMY WITH 25 GAUGE;  Surgeon: Hayden Pedro, MD;  Location: [facility];  Service: Ophthalmology;  Laterality: Right;  . Carotid endarterectomy Right     CEA - 15 yrs ago  . Spine surgery    . Eye surgery    . Cataract extraction w/ intraocular  lens implant Left   . Endarterectomy Left 09/28/2014    Procedure: Left Carotid Endarterectomy;  Surgeon: Rosetta Posner, MD;  Location: [facility];  Service: Vascular;  Laterality: Left;  . Patch angioplasty Left 09/28/2014    Procedure: WITH DACRON PATCH ANGIOPLASTY;  Surgeon: Rosetta Posner, MD;  Location: [facility];  Service: Vascular;  Laterality: Left;       History of present illness and  Hospital Course:     Kindly see H&P for history of present illness and admission details, please review complete Labs, Consult reports and Test reports for all details in brief  HPI  from the history and physical done on the day of  Admission 11/09/2015 [name] is a 82 year old male with past medical history significant for COPD, CKD stage III, HTN, ischemic cardiomyopathy, systolic CHF last EF [id number] in 10/2015; who presents with complaints of all week history of cough and shortness of breath. He was just admitted to the hospital from 10/14/2015 through 10/23/2015 where he suffered from acute respiratory failure having to be intubated diagnosed at that time with healthcare associated pneumonia with COPD and CHF exacerbation. He was discharged and  sent to [facility] rehabilitation. He reported having a productive cough of thick yellowish greenish sputum that changed to more of a grayish color. Sputum production has become more increased over the last few days.. Patient denies any fever, but overall was feeling worse. Associated symptoms include increased lower leg swelling. He was evaluated at the rehabilitation facility today and chest x-ray was obtained which showed signs of the bilateral pneumonia. It was recommended to treat him as an outpatient but the patient did not feel comfortable and came to the ED for further evaluation.  Upon admission into the emergency department patient has checked with a chest x-ray which showed patchy bilateral infiltrates suggestive of a multifocal pneumonia. Initial WBC 8.6, hemoglobin 8.5, platelet 110, creatinine 1.29, BUN 32, glucose 293, BNP 423.8, and lactate 2.4. Patient able to maintain O2 sats on room air   Hospital Course   82 year old male with history of COPD, CKD stage III, HTN, ischemic cardiomyopathy, systolic CHF last EF [id number] in 10/2015, multiple admission over the last month for respiratory failure secondary to a CPAP, COPD and CHF exacerbation, presents from a facility for complaints of cough on shortness of breath chest x-ray significant for pneumonia, as well blood culture And sputum culture growing Escherichia coli,   E coli HCAP - Multiple recent hospitalizations, treated for pneumonia in the past, his x-ray with evidence of pneumonia,Treated initially with IV vancomycin and Zosyn, sputum culture growing Escherichia coli, transition to IV Rocephin during hospital stay, finish total of 6 days of IV antibiotics. - CT chest was obtained with evidence of multilobar pneumonia, needs repeat CT chest without contrast for follow-up on resolution.  Escherichia coli bacteremia, UTI - Blood culture growing Escherichia coli, initially on IV cefepime , narrowed to IV Rocephin 3/31 given sensitivities,  finish total of IV antibiotics during hospital stay, will need total of 14 days of treatment, giving sensitivities will be discharged on ciprofloxacin for another 8 days as an outpatient. - Will need to continue probiotics. -  repeat blood cultures 3/30: Remains with no growth to date  COPD with mild exacerbation - Patient with mild wheezing on physical exam, continue with nebs,  as well continue with desonide and Brovana. - We'll discharge on scheduled DuoNeb, so we'll discontinue Spiriva on discharge - Continue steroid taper over next 9 days  Acute  on chronic systolic CHF - Patient's EF 40-45% on last echocardiogram done in 10/2015. Patient reporting increased leg swelling. Physical exam reveals 2+ pitting edema bilateral lower extremities on admission - Continue with TED hose -Home dose Lasix increased on discharge from 20-40 mg oral daily - Continue aspirin, Beta blocker and losartan  Anemia of iron deficiency and chronic disease - patient with known chronic iron deficiency anemia, and chronic illness, baseline hemoglobin 7.5-8.5 over last few weeks, he is Hemoccult positive, most likely related to iron supplements, as no evidence of bleed has has normal color stools and his hemoglobin remained stable. - Discussed with [facility], endoscopy 2009 with no acute finding, colonoscopy 2005 with benign polyps, at this point patient is not a candidate for any invasive workup giving his multiple comorbidities. - Continue with iron supplement,received one-time IV Feraheme.  Hyperglycemia:  -Suspected to be steroid-induced however patient blood glucose on arrival is 293. Previously seen have elevated hemoglobin A1c of 6.6 back in 2012  - follow on hemoglobin A1c is 7.3 - Discharge her insulin sliding scale and Lantus  Pressure ulcer  - Bilateral heel ulcers Stage 1 pressure injuries, Coccyx with 2, Stage 2 pressure injuries - Wound care consult appreciated   Essential hypertension -  Continue losartan, Coreg, blood pressure on the lower side, continue to monitor  Jerrye Bushy - PPI   Discharge Condition: stable   Follow UP  Follow-up Information    Follow up with [facility] .   Specialty:  [place] information:   [place] [place] [phone]        Discharge Instructions  and  Discharge Medications         Discharge Instructions    Discharge instructions    Complete by:  As directed   Follow with Primary MD  Melinda Crutch, MD after discharge from SNF  Get CBC, CMP, 2 view Chest X ray checked  by Primary MD next visit.    Activity: As tolerated with Full fall precautions use walker/cane & assistance as needed   Disposition SNF   Diet: Heart Healthy , carbohydrate modified , with feeding assistance and aspiration precautions.  For Heart failure patients - Check your Weight same time everyday, if you gain over 2 pounds, or you develop in leg swelling, experience more shortness of breath or chest pain, call your Primary MD immediately. Follow Cardiac Low Salt Diet and 1.5 lit/day fluid restriction.   On your next visit with your primary care physician please Get Medicines reviewed and adjusted.   Please request your Prim.MD to go over all Hospital Tests and Procedure/Radiological results at the follow up, please get all Hospital records sent to your Prim MD by signing hospital release before you go home.   If you experience worsening of your admission symptoms, develop shortness of breath, life threatening emergency, suicidal or homicidal thoughts you must seek medical attention immediately by calling 911 or calling your MD immediately  if symptoms less severe.  You Must read complete instructions/literature along with all the possible adverse reactions/side effects for all the Medicines you take and that have been prescribed to you. Take any new Medicines after you have completely  understood and accpet all the possible adverse reactions/side effects.   Do not drive, operating heavy machinery, perform activities at heights, swimming or participation in water activities or provide baby sitting services if your were admitted for syncope or siezures until you have seen by Primary  MD or a Neurologist and advised to do so again.  Do not drive when taking Pain medications.    Do not take more than prescribed Pain, Sleep and Anxiety Medications  Special Instructions: If you have smoked or chewed Tobacco  in the last 2 yrs please stop smoking, stop any regular Alcohol  and or any Recreational drug use.  Wear Seat belts while driving.   Please note  You were cared for by a hospitalist during your hospital stay. If you have any questions about your discharge medications or the care you received while you were in the hospital after you are discharged, you can call the unit and asked to speak with the hospitalist on call if the hospitalist that took care of you is not available. Once you are discharged, your primary care physician will handle any further medical issues. Please note that NO REFILLS for any discharge medications will be authorized once you are discharged, as it is imperative that you return to your primary care physician (or establish a relationship with a primary care physician if you do not have one) for your aftercare needs so that they can reassess your need for medications and monitor your lab values.     Increase activity slowly    Complete by:  As directed             Medication List    STOP taking these medications        enoxaparin 40 MG/0.4ML injection  Commonly known as:  LOVENOX     metoCLOPramide 5 MG tablet  Commonly known as:  REGLAN     tiotropium 18 MCG inhalation capsule  Commonly known as:  SPIRIVA     VENTOLIN HFA 108 (90 Base) MCG/ACT inhaler  Generic drug:  albuterol  Replaced by:  albuterol (2.5 MG/3ML) 0.083% nebulizer solution        TAKE these medications        acidophilus Caps capsule  Take 2 capsules by mouth daily.     albuterol (2.5 MG/3ML) 0.083% nebulizer solution  Commonly known as:  PROVENTIL  Take 3 mLs (2.5 mg total) by nebulization every 6 (six) hours as needed for wheezing.     aspirin EC 81 MG tablet  Take 81 mg by mouth daily.     carvedilol 12.5 MG tablet  Commonly known as:  COREG  TAKE 1 TABLET TWICE DAILY WITH A MEAL     ciprofloxacin 500 MG tablet  Commonly known as:  CIPRO  Take 1 tablet (500 mg total) by mouth 2 (two) times daily. Please take for total of 8 days.     ferrous sulfate 325 (65 FE) MG tablet  Take 1 tablet (325 mg total) by mouth 2 (two) times daily with a meal.     furosemide 20 MG tablet  Commonly known as:  LASIX  Take 2 tablets (40 mg total) by mouth daily.     Glycopyrrolate-Formoterol 9-4.8 MCG/ACT Aero  Commonly known as:  BEVESPI AEROSPHERE  Inhale 2 puffs into the lungs every 12 (twelve) hours.     guaifenesin 100 MG/5ML syrup  Commonly known as:  ROBITUSSIN  Take 10 mg by mouth every 4 (four) hours as needed for cough.     guaiFENesin 600 MG 12 hr tablet  Commonly known as:  MUCINEX  Take 1 tablet (600 mg total) by mouth 2 (two) times daily. Please take for 2 weeks.     insulin aspart 100 UNIT/ML injection  Commonly known  as:  novoLOG  Inject 0-9 Units into the skin 3 (three) times daily with meals.     insulin glargine 100 UNIT/ML injection  Commonly known as:  LANTUS  Inject 0.05 mLs (5 Units total) into the skin daily.     ipratropium-albuterol 0.5-2.5 (3) MG/3ML Soln  Commonly known as:  DUONEB  Take 3 mLs by nebulization 3 (three) times daily.     LINZESS 145 MCG Caps capsule  Generic drug:  Linaclotide  Take 1 capsule by mouth daily with breakfast.     losartan 25 MG tablet  Commonly known as:  COZAAR  Take 1 tablet (25 mg total) by mouth daily.     magic mouthwash Soln  Take 5 mLs by mouth 4 (four) times daily.     meclizine  25 MG tablet  Commonly known as:  ANTIVERT  Take 1 tablet (25 mg total) by mouth daily as needed for dizziness.     mometasone-formoterol 200-5 MCG/ACT Aero  Commonly known as:  DULERA  Inhale 2 puffs into the lungs 2 (two) times daily.     MYRBETRIQ 50 MG Tb24 tablet  Generic drug:  mirabegron ER  Take 50 mg by mouth daily.     NITROSTAT 0.4 MG SL tablet  Generic drug:  nitroGLYCERIN  PLACE 1 TABLET (0.4 MG TOTAL) UNDER THE TONGUE EVERY 5 (FIVE) MINUTES AS NEEDED. FOR CHEST PAIN     omeprazole 20 MG capsule  Commonly known as:  PRILOSEC  Take 40 mg by mouth daily.     polyethylene glycol packet  Commonly known as:  MIRALAX / GLYCOLAX  Take 17 g by mouth 2 (two) times daily.     predniSONE 10 MG tablet  Commonly known as:  DELTASONE  Please take 3 tablets for 3 days, then 2 tablets for 3 days, then 1 tablet for 3 days, then stop     pregabalin 75 MG capsule  Commonly known as:  LYRICA  Take 75 mg by mouth 2 (two) times daily.     senna-docusate 8.6-50 MG tablet  Commonly known as:  Senokot-S  Take 1 tablet by mouth 2 (two) times daily.     simvastatin 10 MG tablet  Commonly known as:  ZOCOR  Take 10 mg by mouth daily at 6 PM.     TYLENOL 325 MG tablet  Generic drug:  acetaminophen  Take 650 mg by mouth every 6 (six) hours as needed for mild pain.          Diet and Activity recommendation: See Discharge Instructions above   Consults obtained -  none   Major procedures and Radiology Reports - PLEASE review detailed and final reports for all details, in brief -      Dg Chest 2 View  11/09/2015  CLINICAL DATA:  Shortness of breath. EXAM: CHEST  2 VIEW COMPARISON:  10/28/2015 chest radiograph. FINDINGS: Surgical clips in the medial lower right neck. Stable cardiomediastinal silhouette with normal heart size. No pneumothorax. No pleural effusion. Patchy consolidation throughout both lower lobes. No pulmonary edema. IMPRESSION: Patchy bilateral lower lobe  consolidation, most suggestive of multilobar pneumonia. Recommend follow-up PA and lateral post treatment chest radiographs in 4-6 weeks. Electronically Signed   By: Ilona Sorrel M.D.   On: 11/09/2015 20:38   Dg Chest 2 View  10/25/2015  CLINICAL DATA:  Shortness of breath and cough EXAM: CHEST  2 VIEW COMPARISON:  10/24/15 FINDINGS: Cardiac shadow is stable. The lungs are well aerated bilaterally. Small effusions  are again noted. No focal confluent infiltrate is seen. IMPRESSION: Small bilateral pleural effusions. Improved aeration in the bases is noted. Electronically Signed   By: Inez Catalina M.D.   On: 10/25/2015 10:07   Dg Chest 2 View  10/24/2015  CLINICAL DATA:  Productive cough for 2-3 days. EXAM: CHEST - 2 VIEW COMPARISON:  Two-view chest x-ray 09/20/2015. FINDINGS: The heart size is normal. Small bilateral pleural effusions are now present. Mild bibasilar airspace opacities are evident posteriorly. The upper lung fields are clear. Emphysematous changes are again noted. Atherosclerotic calcifications are present in the aortic arch. Surgical clips are present in the right neck and at the gallbladder fossa. IMPRESSION: 1. New bilateral pleural effusions and posterior lower lobe airspace disease. While this may represent atelectasis, infection is not excluded. 2. Emphysema. 3. Atherosclerosis of the thoracic aorta. Electronically Signed   By: San Morelle M.D.   On: 10/24/2015 13:38   Ct Chest Wo Contrast  11/10/2015  CLINICAL DATA:  Shortness of breath and cough EXAM: CT CHEST WITHOUT CONTRAST TECHNIQUE: Multidetector CT imaging of the chest was performed following the standard protocol without IV contrast. COMPARISON:  11/09/2015 FINDINGS: The lungs are well aerated bilaterally. Patchy infiltrates are seen bilaterally worst in the right middle and right lower lobes consistent with acute pneumonia. Some scattered more nodular appearing infiltrates are seen as well. One of these nodular areas  measures approximately 6 mm in the right upper lobe best seen on image number 30 of series 5. Second somewhat nodular area is noted on the left measuring 12 mm is in dimension. This is best seen on image number 27 of series 5. A small right-sided pleural effusion is seen. The thoracic inlet is within normal limits. Aortic calcifications are noted. No significant hilar or mediastinal adenopathy is seen. Some calcification in the left ventricular wall is noted likely related to prior infarct. The visualized upper abdomen shows changes of prior cholecystectomy. A left renal cyst is noted as well. Degenerative changes of the thoracic spine are noted. IMPRESSION: Infiltrative changes in both lungs worst in the right middle and right lower lobes. Some nodularity is noted in scattered areas throughout both lungs. This all again likely represents an acute pneumonic process. Short-term followup following appropriate therapy with noncontrast CT of the chest is recommended to assess for resolution or stability. This could be performed in 4-6 weeks Electronically Signed   By: Inez Catalina M.D.   On: 11/10/2015 12:44   Dg Chest Port 1 View  10/28/2015  CLINICAL DATA:  COPD exacerbation.  Short of breath EXAM: PORTABLE CHEST 1 VIEW COMPARISON:  10/26/2015 FINDINGS: Endotracheal tube and NG tubes have been removed. Bilateral airspace disease in the bases left greater than right shows interval improvement. Minimal pleural effusion bilaterally. IMPRESSION: Interval improvement in bilateral airspace disease consistent with clearing edema. Endotracheal tube removed in the interval. Electronically Signed   By: Franchot Gallo M.D.   On: 10/28/2015 07:33   Dg Chest Port 1 View  10/26/2015  CLINICAL DATA:  Intubation EXAM: PORTABLE CHEST 1 VIEW COMPARISON:  Yesterday FINDINGS: New endotracheal tube with tip between the clavicular heads and carina. An orogastric tube reaches stomach at least. Diffuse interstitial opacities are  improved. Kerley lines are still noted. There is asymmetric airspace disease to the left. No definitive effusion. When accounting for a skin fold on the left there is no visible pneumothorax. IMPRESSION: 1. New endotracheal and orogastric tubes are in good position. 2. Pulmonary edema with improvement  since earlier. 3. Airspace disease which could be asymmetric alveolar edema or superimposed pneumonia. Electronically Signed   By: Monte Fantasia M.D.   On: 10/26/2015 02:00   Dg Chest Port 1 View  10/26/2015  CLINICAL DATA:  Acute onset of shortness of breath. Initial encounter. EXAM: PORTABLE CHEST 1 VIEW COMPARISON:  Chest radiograph performed earlier today at 9:24 a.m. FINDINGS: The lungs are hyperexpanded. The lung bases are incompletely imaged on this study. Vascular congestion is noted. Bibasilar airspace opacities likely reflect pulmonary edema, significantly worsened from the recent prior study, though pneumonia could have a similar appearance. No definite pleural effusion or pneumothorax is seen. The cardiomediastinal silhouette is within normal limits. No acute osseous abnormalities are seen. IMPRESSION: Lungs hypoexpanded. Vascular congestion noted. Bibasilar airspace opacities likely reflect pulmonary edema, significantly worsened from the prior study, though pneumonia could have a similar appearance. Electronically Signed   By: Garald Balding M.D.   On: 10/26/2015 00:06   Dg Abd 2 Views  10/29/2015  CLINICAL DATA:  Partial small bowel obstruction. EXAM: ABDOMEN - 2 VIEW COMPARISON:  10/26/2015 FINDINGS: Nasogastric tube no longer visualized. Gaseous dilatation of small bowel remains with some air also present throughout most of the colon. Dilatation of small bowel slightly worse compared to 3/14. Findings remain consistent with partial small bowel obstruction versus generalized ileus. Decubitus film shows no evidence of free intraperitoneal air. No abnormal calcifications identified. IMPRESSION:  Slight worsening of partial small bowel obstruction versus ileus. Electronically Signed   By: Aletta Edouard M.D.   On: 10/29/2015 11:39   Dg Abd 2 Views  10/17/2015  CLINICAL DATA:  Follow-up small bowel obstruction EXAM: ABDOMEN - 2 VIEW COMPARISON:  10/16/2015 FINDINGS: Oral contrast is seen primarily throughout the large bowel with air-fluid levels throughout the large bowel. Contrast is seen into the distal sigmoid. Large bowel is relatively decompressed as compared to small bowel, with numerous distended loops of mid abdominal small bowel again identified measuring up to about 4.5 cm in diameter. Small bowel air-fluid levels are stable. There is opacity at the left lung base most consistent with atelectasis. IMPRESSION: Stable findings of small bowel obstruction Electronically Signed   By: Skipper Cliche M.D.   On: 10/17/2015 08:52   Dg Abd Portable 1v  10/26/2015  CLINICAL DATA:  Followup partial small bowel obstruction. EXAM: PORTABLE ABDOMEN - 1 VIEW COMPARISON:  10/20/2015 and earlier, including CT abdomen and pelvis 10/15/2015. FINDINGS: Persisting gaseous distention of multiple loops of small bowel in the mid abdomen. Gas and stool in normal caliber colon. Nasogastric tube looped in the stomach with its tip in the distal body. No suggestion of free air on the supine image. Prior cholecystectomy. Developing airspace opacities in both lung bases. IMPRESSION: 1. No significant change since yesterday in the partial small bowel obstruction. 2. Developing pneumonia in both lung bases. Electronically Signed   By: Evangeline Dakin M.D.   On: 10/26/2015 07:58   Dg Abd Portable 1v  10/20/2015  CLINICAL DATA:  Followup small bowel obstruction. EXAM: PORTABLE ABDOMEN - 1 VIEW COMPARISON:  10/17/2015.  CT, 10/15/2015. FINDINGS: Mild dilation of small bowel is seen centrally. There is air in stool in the colon as well as some contrast in the left colon. The degree of small bowel dilation is mildly decreased  from the prior exam. No new abnormalities. IMPRESSION: 1. Mild improvement in the partial small bowel obstruction with less small bowel dilation noted on the current study. Electronically Signed   By:  Lajean Manes M.D.   On: 10/20/2015 11:56    Micro Results     Recent Results (from the past 240 hour(s))  Culture, blood (routine x 2)     Status: None   Collection Time: 11/09/15  7:40 PM  Result Value Ref Range Status   Specimen Description BLOOD LEFT ANTECUBITAL  Final   Special Requests BOTTLES DRAWN AEROBIC AND ANAEROBIC 10 CC  Final   Culture  Setup Time   Final    GRAM NEGATIVE RODS ANAEROBIC BOTTLE ONLY CRITICAL RESULT CALLED TO, READ BACK BY AND VERIFIED WITH: E. CAUDLE,RN AT [id number] ON [id number] BY S. YARBROUGH    Culture   Final    ESCHERICHIA COLI SUSCEPTIBILITIES PERFORMED ON PREVIOUS CULTURE WITHIN THE LAST 5 DAYS. Performed at [facility]    Report Status 11/12/2015 FINAL  Final  Culture, blood (routine x 2)     Status: None   Collection Time: 11/09/15  7:44 PM  Result Value Ref Range Status   Specimen Description BLOOD RIGHT ANTECUBITAL  Final   Special Requests BOTTLES DRAWN AEROBIC AND ANAEROBIC 10 CC  Final   Culture  Setup Time   Final    GRAM NEGATIVE RODS IN BOTH AEROBIC AND ANAEROBIC BOTTLES CRITICAL RESULT CALLED TO, READ BACK BY AND VERIFIED WITH: E. CAUDLE,RN AT [id number] ON DX:[id number] BY Rhea Bleacher    Culture   Final    ESCHERICHIA COLI Performed at [facility]    Report Status 11/12/2015 FINAL  Final   Organism ID, Bacteria ESCHERICHIA COLI  Final      Susceptibility   Escherichia coli - MIC*    AMPICILLIN >=32 RESISTANT Resistant     CEFAZOLIN <=4 SENSITIVE Sensitive     CEFEPIME <=1 SENSITIVE Sensitive     CEFTAZIDIME <=1 SENSITIVE Sensitive     CEFTRIAXONE <=1 SENSITIVE Sensitive     CIPROFLOXACIN 1 SENSITIVE Sensitive     GENTAMICIN <=1 SENSITIVE Sensitive     IMIPENEM <=0.25 SENSITIVE Sensitive     TRIMETH/SULFA >=320 RESISTANT  Resistant     AMPICILLIN/SULBACTAM 16 INTERMEDIATE Intermediate     PIP/TAZO <=4 SENSITIVE Sensitive     * ESCHERICHIA COLI  Urine culture     Status: None   Collection Time: 11/10/15 12:06 AM  Result Value Ref Range Status   Specimen Description URINE, CLEAN CATCH  Final   Special Requests NONE  Final   Culture   Final    60,000 COLONIES/ml YEAST Performed at [facility]    Report Status 11/11/2015 FINAL  Final  Culture, sputum-assessment     Status: None   Collection Time: 11/10/15 12:06 AM  Result Value Ref Range Status   Specimen Description SPUTUM  Final   Special Requests NONE  Final   Sputum evaluation   Final    THIS SPECIMEN IS ACCEPTABLE. RESPIRATORY CULTURE REPORT TO FOLLOW.   Report Status 11/10/2015 FINAL  Final  Culture, respiratory (NON-Expectorated)     Status: None   Collection Time: 11/10/15 12:06 AM  Result Value Ref Range Status   Specimen Description SPUTUM  Final   Special Requests NONE  Final   Gram Stain   Final    MODERATE WBC PRESENT,BOTH PMN AND MONONUCLEAR FEW SQUAMOUS EPITHELIAL CELLS PRESENT ABUNDANT GRAM NEGATIVE RODS MODERATE GRAM POSITIVE COCCI IN PAIRS IN CLUSTERS FEW YEAST THIS SPECIMEN IS ACCEPTABLE FOR SPUTUM CULTURE Performed at [facility]    Culture   Final    ABUNDANT ESCHERICHIA COLI  Performed at [facility]    Report Status 11/12/2015 FINAL  Final   Organism ID, Bacteria ESCHERICHIA COLI  Final      Susceptibility   Escherichia coli - MIC*    AMPICILLIN >=32 RESISTANT Resistant     AMPICILLIN/SULBACTAM 16 INTERMEDIATE Intermediate     CEFAZOLIN <=4 SENSITIVE Sensitive     CEFEPIME <=1 SENSITIVE Sensitive     CEFTAZIDIME <=1 SENSITIVE Sensitive     CEFTRIAXONE <=1 SENSITIVE Sensitive     CIPROFLOXACIN 1 SENSITIVE Sensitive     GENTAMICIN <=1 SENSITIVE Sensitive     IMIPENEM <=0.25 SENSITIVE Sensitive     PIP/TAZO <=4 SENSITIVE Sensitive     TOBRAMYCIN <=1 SENSITIVE Sensitive     TRIMETH/SULFA  Value in next row Resistant      >=320 RESISTANT(NOTE)    * ABUNDANT ESCHERICHIA COLI  Culture, blood (Routine X 2) w Reflex to ID Panel     Status: None (Preliminary result)   Collection Time: 11/11/15  2:06 PM  Result Value Ref Range Status   Specimen Description BLOOD LEFT HAND  Final   Special Requests BOTTLES DRAWN AEROBIC ONLY [place]  Final   Culture   Final    NO GROWTH 4 DAYS Performed at [facility]    Report Status PENDING  Incomplete  Culture, blood (Routine X 2) w Reflex to ID Panel     Status: None (Preliminary result)   Collection Time: 11/11/15  2:09 PM  Result Value Ref Range Status   Specimen Description BLOOD RIGHT HAND  Final   Special Requests BOTTLES DRAWN AEROBIC AND ANAEROBIC [place]  Final   Culture   Final    NO GROWTH 4 DAYS Performed at [facility]    Report Status PENDING  Incomplete       Today   Subjective:   [name] today has no headache,no chest abdominal pain,no new weakness , remains with cough, reports it's less productive. Objective:   Blood pressure 108/47, pulse 73, temperature 97.5 F (36.4 C), temperature source Oral, resp. rate 20, height 6' (1.829 m), weight 74.617 kg (164 lb 8 oz), SpO2 90 %.   Intake/Output Summary (Last 24 hours) at 11/15/15 1228 Last data filed at 11/15/15 0440  Gross per 24 hour  Intake    240 ml  Output   1650 ml  Net  -1410 ml    Exam Awake Alert, Oriented X 3,  Supple Neck,No JVD, No cervical lymphadenopathy appriciated.  Symmetrical Chest wall movement, bilateral rhonchi, RRR,No Gallops,Rubs or new Murmurs, No Parasternal Heave +ve B.Sounds, Abd Soft, No tenderness, No organomegaly appriciated, No rebound - guarding or rigidity. No Cyanosis, Clubbing , + 1 bilateral edema,   Data Review   CBC w Diff:  Lab Results  Component Value Date   WBC 7.7 11/15/2015   WBC 5.7 04/21/2008   HGB 8.4* 11/15/2015   HGB 10.6* 04/21/2008   HCT 26.8* 11/15/2015   HCT 33.6* 04/21/2008   PLT  149* 11/15/2015   PLT 167 04/21/2008   LYMPHOPCT 8 11/10/2015   LYMPHOPCT 34.7 04/21/2008   MONOPCT 4 11/10/2015   MONOPCT 7.9 04/21/2008   EOSPCT 1 11/10/2015   EOSPCT 5.0 04/21/2008   BASOPCT 0 11/10/2015   BASOPCT 0.7 04/21/2008    CMP:  Lab Results  Component Value Date   NA 141 11/15/2015   K 3.5 11/15/2015   CL 99* 11/15/2015   CO2 34* 11/15/2015   BUN 28* 11/15/2015   CREATININE  1.14 11/15/2015   CREATININE 1.10 04/24/2011   PROT 6.0* 11/09/2015   ALBUMIN 2.6* 11/09/2015   BILITOT 0.4 11/09/2015   ALKPHOS 60 11/09/2015   AST 14* 11/09/2015   ALT 16* 11/09/2015  .   Total Time in preparing paper work, data evaluation and todays exam - 35 minutes  ELGERGAWY, DAWOOD M.D on 11/15/2015 at 12:28 PM  [facility]   Office  [phone]

## 2015-11-16 ENCOUNTER — Non-Acute Institutional Stay (SKILLED_NURSING_FACILITY): Payer: Medicare Other | Admitting: Adult Health

## 2015-11-16 ENCOUNTER — Encounter: Payer: Self-pay | Admitting: Adult Health

## 2015-11-16 DIAGNOSIS — J189 Pneumonia, unspecified organism: Secondary | ICD-10-CM | POA: Diagnosis not present

## 2015-11-16 DIAGNOSIS — N390 Urinary tract infection, site not specified: Secondary | ICD-10-CM

## 2015-11-16 DIAGNOSIS — G629 Polyneuropathy, unspecified: Secondary | ICD-10-CM

## 2015-11-16 DIAGNOSIS — R531 Weakness: Secondary | ICD-10-CM | POA: Diagnosis not present

## 2015-11-16 DIAGNOSIS — N3281 Overactive bladder: Secondary | ICD-10-CM | POA: Diagnosis not present

## 2015-11-16 DIAGNOSIS — I10 Essential (primary) hypertension: Secondary | ICD-10-CM

## 2015-11-16 DIAGNOSIS — K5900 Constipation, unspecified: Secondary | ICD-10-CM

## 2015-11-16 DIAGNOSIS — K219 Gastro-esophageal reflux disease without esophagitis: Secondary | ICD-10-CM | POA: Diagnosis not present

## 2015-11-16 DIAGNOSIS — B9620 Unspecified Escherichia coli [E. coli] as the cause of diseases classified elsewhere: Secondary | ICD-10-CM

## 2015-11-16 DIAGNOSIS — E43 Unspecified severe protein-calorie malnutrition: Secondary | ICD-10-CM

## 2015-11-16 DIAGNOSIS — J449 Chronic obstructive pulmonary disease, unspecified: Secondary | ICD-10-CM

## 2015-11-16 DIAGNOSIS — E1140 Type 2 diabetes mellitus with diabetic neuropathy, unspecified: Secondary | ICD-10-CM | POA: Diagnosis not present

## 2015-11-16 DIAGNOSIS — K5909 Other constipation: Secondary | ICD-10-CM

## 2015-11-16 DIAGNOSIS — D509 Iron deficiency anemia, unspecified: Secondary | ICD-10-CM | POA: Diagnosis not present

## 2015-11-16 DIAGNOSIS — E785 Hyperlipidemia, unspecified: Secondary | ICD-10-CM | POA: Diagnosis not present

## 2015-11-16 DIAGNOSIS — I5022 Chronic systolic (congestive) heart failure: Secondary | ICD-10-CM | POA: Diagnosis not present

## 2015-11-16 DIAGNOSIS — Z794 Long term (current) use of insulin: Secondary | ICD-10-CM

## 2015-11-16 DIAGNOSIS — L8992 Pressure ulcer of unspecified site, stage 2: Secondary | ICD-10-CM

## 2015-11-16 LAB — CULTURE, BLOOD (ROUTINE X 2)
CULTURE: NO GROWTH
Culture: NO GROWTH

## 2015-11-16 NOTE — Progress Notes (Signed)
Patient ID: ALEXXANDER BRINDLE, male   DOB: 1924/06/05, 82 y.o.   MRN: [id number]    DATE:  11/16/2015   MRN:  [id number]  BIRTHDAY: 07/23/1924  Facility:  Nursing Home Location:  [place] Room Number: 106-P  LEVEL OF CARE:  SNF (31)  Contact Information    Name Relation Home Work Mobile   Simpkins,Cindy Daughter   [phone]   Jacyon, Marban  [phone]    Alucard, [place]   [phone]       Code Status History    Date Active Date Inactive Code Status Order ID Comments User Context   11/09/2015 10:37 PM 11/15/2015  6:12 PM Full Code [id number]  Norval Morton, MD ED   11/09/2015 10:37 PM 11/09/2015 10:37 PM Full Code [id number]  Norval Morton, MD ED   10/24/2015  4:50 PM 11/02/2015  6:36 PM Full Code [id number]  Kelvin Cellar, MD Inpatient   10/15/2015  2:01 AM 10/23/2015  4:03 PM Full Code [id number]  Quintella Baton, MD Inpatient   09/20/2015  9:36 PM 09/23/2015  4:41 PM Full Code [id number]  Vianne Bulls, MD ED   01/08/2015  9:19 AM 01/14/2015  4:48 PM Full Code [id number]  Erick Colace, NP ED   09/28/2014  3:23 PM 09/29/2014  1:51 PM Full Code [id number]  Alvia Grove, PA-C Inpatient       Chief Complaint  Patient presents with  . Hospitalization Follow-up    HISTORY OF PRESENT ILLNESS:  This is an 82 year old male who has been admitted to [facility] on 11/15/15 from [facility]. He has PMH of COPD, CKD stage III, HTN, ischemic cardiomyopathy, systolic CHF last EF [id number] in 10/2015, multiple admission over the last month for respiratory failure secondary to a CPAP, COPD and CHF exacerbation. He was recently hospitalized due HCAP with COPD and CHF exacerbation. He was intubated. He was discharged and sent to [facility] for a short-term rehabilitation.  However, he was sent back to the hospital due to bilateral pneumonia. He was started on Vancomycin and Zosyn and transitioned to IV Rocephin for a total of 6 days IV antibiotics. She was discharged on  Ciprofloxacin X 8 days due to E coli bacteremia, UTI.  He has been admitted for a short-term rehabilitation.   PAST MEDICAL HISTORY:  Past Medical History  Diagnosis Date  . Carotid artery stenosis   . Essential hypertension   . Myocardial infarction ([place]) 1979  . Coronary artery disease     Known occluded proximal LAD with collaterals, moderate RCA disease - managed medically 2009  . COPD (chronic obstructive pulmonary disease) ([place])   . History of stroke   . Prostate cancer ([place])   . Arthritis   . Foot drop, right   . Neuropathy ([place])   . Anemia   . Urinary incontinence, male, stress   . GERD (gastroesophageal reflux disease)   . Thalassemia   . Wears dentures   . Deafness in left ear     Wears hearing aide in right ear  . Sleep apnea     CPAP  . Ischemic cardiomyopathy     LVEF 40-45%  . Hypercholesteremia   . Anemia   . Osteoarthritis   . Stroke ([place])   . Heart attack ([place])   . Vertigo   . Hereditary and idiopathic peripheral neuropathy 03/01/2015  . Abnormality of gait 03/01/2015     CURRENT MEDICATIONS: Reviewed  Patient's Medications  New Prescriptions  No medications on file  Previous Medications   ACETAMINOPHEN (TYLENOL) 325 MG TABLET    Take 650 mg by mouth every 6 (six) hours as needed for mild pain.    ACIDOPHILUS (RISAQUAD) CAPS CAPSULE    Take 2 capsules by mouth daily.   ALBUTEROL (PROVENTIL) (2.5 MG/3ML) 0.083% NEBULIZER SOLUTION    Take 3 mLs (2.5 mg total) by nebulization every 6 (six) hours as needed for wheezing.   ASPIRIN EC 81 MG TABLET    Take 81 mg by mouth daily.   CARVEDILOL (COREG) 12.5 MG TABLET    TAKE 1 TABLET TWICE DAILY WITH A MEAL   CIPROFLOXACIN (CIPRO) 500 MG TABLET    Take 1 tablet (500 mg total) by mouth 2 (two) times daily. Please take for total of 8 days.   FERROUS SULFATE 325 (65 FE) MG TABLET    Take 1 tablet (325 mg total) by mouth 2 (two) times daily with a meal.   FUROSEMIDE (LASIX) 40 MG TABLET    Take 40 mg by mouth.    GLYCOPYRROLATE-FORMOTEROL (BEVESPI AEROSPHERE) 9-4.8 MCG/ACT AERO    Inhale 2 puffs into the lungs every 12 (twelve) hours.   GUAIFENESIN (MUCINEX) 600 MG 12 HR TABLET    Take 1 tablet (600 mg total) by mouth 2 (two) times daily. Please take for 2 weeks.   GUAIFENESIN (ROBITUSSIN) 100 MG/5ML SYRUP    Take 10 mg by mouth every 4 (four) hours as needed for cough.   INSULIN ASPART (NOVOLOG) 100 UNIT/ML INJECTION    Inject 0-9 Units into the skin. For CBGs 0-69 = 0 units, 70-120 = 0 units, 121-150 = 1 unit, 151-200 = 2 units, 201-250 = 3 units, 252-300 = 5 units, 301-350 = 7 units, 351-400 = 9 units   INSULIN GLARGINE (LANTUS) 100 UNIT/ML INJECTION    Inject 0.05 mLs (5 Units total) into the skin daily.   IPRATROPIUM-ALBUTEROL (DUONEB) 0.5-2.5 (3) MG/3ML SOLN    Take 3 mLs by nebulization 3 (three) times daily.   LINZESS 145 MCG CAPS CAPSULE    Take 1 capsule by mouth daily with breakfast.   LOSARTAN (COZAAR) 25 MG TABLET    Take 1 tablet (25 mg total) by mouth daily.   MAGIC MOUTHWASH SOLN    Take 5 mLs by mouth 4 (four) times daily.   MECLIZINE (ANTIVERT) 25 MG TABLET    Take 1 tablet (25 mg total) by mouth daily as needed for dizziness.   MOMETASONE-FORMOTEROL (DULERA) 200-5 MCG/ACT AERO    Inhale 2 puffs into the lungs 2 (two) times daily.   MYRBETRIQ 50 MG TB24 TABLET    Take 50 mg by mouth daily.   NITROSTAT 0.4 MG SL TABLET    PLACE 1 TABLET (0.4 MG TOTAL) UNDER THE TONGUE EVERY 5 (FIVE) MINUTES AS NEEDED. FOR CHEST PAIN   OMEPRAZOLE (PRILOSEC) 20 MG CAPSULE    Take 40 mg by mouth daily.   POLYETHYLENE GLYCOL (MIRALAX / GLYCOLAX) PACKET    Take 17 g by mouth 2 (two) times daily.   PREDNISONE (DELTASONE) 10 MG TABLET    Please take 3 tablets for 3 days, then 2 tablets for 3 days, then 1 tablet for 3 days, then stop   PREGABALIN (LYRICA) 75 MG CAPSULE    Take 75 mg by mouth 2 (two) times daily.    SENNA-DOCUSATE (SENOKOT-S) 8.6-50 MG TABLET    Take 1 tablet by mouth 2 (two) times daily.    SIMVASTATIN (ZOCOR) 10 MG  TABLET    Take 10 mg by mouth daily at 6 PM.   Modified Medications   No medications on file  Discontinued Medications   FUROSEMIDE (LASIX) 20 MG TABLET    Take 2 tablets (40 mg total) by mouth daily.   INSULIN ASPART (NOVOLOG) 100 UNIT/ML INJECTION    Inject 0-9 Units into the skin 3 (three) times daily with meals.     Allergies  Allergen Reactions  . Clopidogrel Bisulfate Hives    REACTION: red blue, black blotches  . Dilaudid [Hydromorphone Hcl] Hives    Hallucinations, little green men working on the building  . Penicillins Hives    Has patient had a PCN reaction causing immediate rash, facial/tongue/throat swelling, SOB or lightheadedness with hypotension: Yes Has patient had a PCN reaction causing severe rash involving mucus membranes or skin necrosis: No Has patient had a PCN reaction that required hospitalization No Has patient had a PCN reaction occurring within the last 10 years: No If all of the above answers are "NO", then may proceed with Cephalosporin use.   . Sulfa Antibiotics Other (See Comments)    Nightmares, hallucinations  . Sulfonamide Derivatives Other (See Comments)    Nightmares, hallucinations  . Doxycycline Hyclate Rash     REVIEW OF SYSTEMS:  GENERAL: no change in appetite, no fatigue, no weight changes, no fever, chills or weakness EYES: Denies change in vision, dry eyes, eye pain, itching or discharge EARS: Denies change in hearing, ringing in ears, or earache NOSE: Denies nasal congestion or epistaxis MOUTH and THROAT: Denies oral discomfort, gingival pain or bleeding, pain from teeth or hoarseness   RESPIRATORY: no SOB, DOE, wheezing, hemoptysis; +cough CARDIAC: no chest pain, or palpitations GI: no abdominal pain, diarrhea, constipation, heart burn, nausea or vomiting GU: Denies dysuria, frequency, hematuria, incontinence, or discharge PSYCHIATRIC: Denies feeling of depression or anxiety. No report of hallucinations,  insomnia, paranoia, or agitation   PHYSICAL EXAMINATION  GENERAL APPEARANCE: Well nourished. In no acute distress. Normal body habitus SKIN:  Skin is warm and dry. Has Sacral pressure ulcer stage 2 covered with foam dressing; has right shin wound, covered with dry HEAD: Normal in size and contour. No evidence of trauma EYES: Lids open and close normally. No blepharitis, entropion or ectropion. PERRL. Conjunctivae are clear and sclerae are white. Lenses are without opacity EARS: Pinnae are normal. Patient hears normal voice tunes of the examiner MOUTH and THROAT: Lips are without lesions. Oral mucosa is moist and without lesions. Tongue is normal in shape, size, and color and without lesions NECK: supple, trachea midline, no neck masses, no thyroid tenderness, no thyromegaly LYMPHATICS: no LAN in the neck, no supraclavicular LAN RESPIRATORY: breathing is even & unlabored, rales noted on right lung field CARDIAC: RRR, no murmur,no extra heart sounds, BLE edema 1+ edema EXTREMITIES:  Able to move X 4 extremities; wear bilateral feet AFO due to foot drop; BLE generalized weakbess PSYCHIATRIC: Alert and oriented X 3. Affect and behavior are appropriate  LABS/RADIOLOGY: Labs reviewed: Basic Metabolic Panel:  Recent Labs  10/31/15 0549 11/01/15 0453 11/02/15 0512  11/12/15 0523 11/13/15 0537 11/15/15 0529  NA 141 141 141  < > 139 141 141  K 4.2 3.9 3.8  < > 3.7 3.3* 3.5  CL 106 105 105  < > 102 100* 99*  CO2 27 29 30   < > 29 32 34*  GLUCOSE 188* 177* 150*  < > 157* 144* 136*  BUN 35* 35* 31*  < >  32* 33* 28*  CREATININE 1.28*  1.16 1.16 1.29*  < > 1.38* 1.10 1.14  CALCIUM 8.3* 8.3* 8.1*  < > 8.2* 8.3* 8.5*  MG 2.0 2.0 2.1  --   --   --   --   PHOS 3.5 3.2 2.3*  --   --   --   --   < > = values in this interval not displayed. Liver Function Tests:  Recent Labs  09/21/15 0501 10/24/15 1358  11/01/15 0453 11/02/15 0512 11/09/15 1923  AST 15 18  --   --   --  14*  ALT 9* 12*   --   --   --  16*  ALKPHOS 73 56  --   --   --  60  BILITOT 0.7 0.8  --   --   --  0.4  PROT 5.8* 6.1*  --   --   --  6.0*  ALBUMIN 2.9* 3.1*  < > 2.6* 2.4* 2.6*  < > = values in this interval not displayed.  Recent Labs  01/08/15 0725  LIPASE 15*   CBC:  Recent Labs  11/02/15 0512 11/09/15 1923 11/10/15 0508  11/12/15 0523 11/13/15 0537 11/15/15 0529  WBC 7.1 8.6 10.1  < > 8.9 8.1 7.7  NEUTROABS 5.7 8.0* 8.8*  --   --   --   --   HGB 8.5* 8.5* 8.5*  < > 7.4* 7.7* 8.4*  HCT 27.8* 27.7* 27.5*  < > 23.4* 25.2* 26.8*  MCV 63.2* 61.7* 60.6*  < > 60.6* 60.1* 61.3*  PLT 179 110* 121*  < > 92* 111* 149*  < > = values in this interval not displayed.  Cardiac Enzymes:  Recent Labs  01/08/15 2050 10/26/15 0245 11/09/15 1923  TROPONINI 0.23* 0.06* 0.03   BNP: Invalid input(s): POCBNP CBG:  Recent Labs  11/14/15 2104 11/15/15 0734 11/15/15 1154  GLUCAP 218* 116* 231*      Dg Chest 2 View  11/09/2015  CLINICAL DATA:  Shortness of breath. EXAM: CHEST  2 VIEW COMPARISON:  10/28/2015 chest radiograph. FINDINGS: Surgical clips in the medial lower right neck. Stable cardiomediastinal silhouette with normal heart size. No pneumothorax. No pleural effusion. Patchy consolidation throughout both lower lobes. No pulmonary edema. IMPRESSION: Patchy bilateral lower lobe consolidation, most suggestive of multilobar pneumonia. Recommend follow-up PA and lateral post treatment chest radiographs in 4-6 weeks. Electronically Signed   By: Ilona Sorrel M.D.   On: 11/09/2015 20:38   Dg Chest 2 View  10/25/2015  CLINICAL DATA:  Shortness of breath and cough EXAM: CHEST  2 VIEW COMPARISON:  10/24/15 FINDINGS: Cardiac shadow is stable. The lungs are well aerated bilaterally. Small effusions are again noted. No focal confluent infiltrate is seen. IMPRESSION: Small bilateral pleural effusions. Improved aeration in the bases is noted. Electronically Signed   By: Inez Catalina M.D.   On: 10/25/2015  10:07   Dg Chest 2 View  10/24/2015  CLINICAL DATA:  Productive cough for 2-3 days. EXAM: CHEST - 2 VIEW COMPARISON:  Two-view chest x-ray 09/20/2015. FINDINGS: The heart size is normal. Small bilateral pleural effusions are now present. Mild bibasilar airspace opacities are evident posteriorly. The upper lung fields are clear. Emphysematous changes are again noted. Atherosclerotic calcifications are present in the aortic arch. Surgical clips are present in the right neck and at the gallbladder fossa. IMPRESSION: 1. New bilateral pleural effusions and posterior lower lobe airspace disease. While this may represent atelectasis, infection is not  excluded. 2. Emphysema. 3. Atherosclerosis of the thoracic aorta. Electronically Signed   By: San Morelle M.D.   On: 10/24/2015 13:38   Ct Chest Wo Contrast  11/10/2015  CLINICAL DATA:  Shortness of breath and cough EXAM: CT CHEST WITHOUT CONTRAST TECHNIQUE: Multidetector CT imaging of the chest was performed following the standard protocol without IV contrast. COMPARISON:  11/09/2015 FINDINGS: The lungs are well aerated bilaterally. Patchy infiltrates are seen bilaterally worst in the right middle and right lower lobes consistent with acute pneumonia. Some scattered more nodular appearing infiltrates are seen as well. One of these nodular areas measures approximately 6 mm in the right upper lobe best seen on image number 30 of series 5. Second somewhat nodular area is noted on the left measuring 12 mm is in dimension. This is best seen on image number 27 of series 5. A small right-sided pleural effusion is seen. The thoracic inlet is within normal limits. Aortic calcifications are noted. No significant hilar or mediastinal adenopathy is seen. Some calcification in the left ventricular wall is noted likely related to prior infarct. The visualized upper abdomen shows changes of prior cholecystectomy. A left renal cyst is noted as well. Degenerative changes of the  thoracic spine are noted. IMPRESSION: Infiltrative changes in both lungs worst in the right middle and right lower lobes. Some nodularity is noted in scattered areas throughout both lungs. This all again likely represents an acute pneumonic process. Short-term followup following appropriate therapy with noncontrast CT of the chest is recommended to assess for resolution or stability. This could be performed in 4-6 weeks Electronically Signed   By: Inez Catalina M.D.   On: 11/10/2015 12:44   Dg Chest Port 1 View  10/28/2015  CLINICAL DATA:  COPD exacerbation.  Short of breath EXAM: PORTABLE CHEST 1 VIEW COMPARISON:  10/26/2015 FINDINGS: Endotracheal tube and NG tubes have been removed. Bilateral airspace disease in the bases left greater than right shows interval improvement. Minimal pleural effusion bilaterally. IMPRESSION: Interval improvement in bilateral airspace disease consistent with clearing edema. Endotracheal tube removed in the interval. Electronically Signed   By: Franchot Gallo M.D.   On: 10/28/2015 07:33   Dg Chest Port 1 View  10/26/2015  CLINICAL DATA:  Intubation EXAM: PORTABLE CHEST 1 VIEW COMPARISON:  Yesterday FINDINGS: New endotracheal tube with tip between the clavicular heads and carina. An orogastric tube reaches stomach at least. Diffuse interstitial opacities are improved. Kerley lines are still noted. There is asymmetric airspace disease to the left. No definitive effusion. When accounting for a skin fold on the left there is no visible pneumothorax. IMPRESSION: 1. New endotracheal and orogastric tubes are in good position. 2. Pulmonary edema with improvement since earlier. 3. Airspace disease which could be asymmetric alveolar edema or superimposed pneumonia. Electronically Signed   By: Monte Fantasia M.D.   On: 10/26/2015 02:00   Dg Chest Port 1 View  10/26/2015  CLINICAL DATA:  Acute onset of shortness of breath. Initial encounter. EXAM: PORTABLE CHEST 1 VIEW COMPARISON:  Chest  radiograph performed earlier today at 9:24 a.m. FINDINGS: The lungs are hyperexpanded. The lung bases are incompletely imaged on this study. Vascular congestion is noted. Bibasilar airspace opacities likely reflect pulmonary edema, significantly worsened from the recent prior study, though pneumonia could have a similar appearance. No definite pleural effusion or pneumothorax is seen. The cardiomediastinal silhouette is within normal limits. No acute osseous abnormalities are seen. IMPRESSION: Lungs hypoexpanded. Vascular congestion noted. Bibasilar airspace opacities likely reflect  pulmonary edema, significantly worsened from the prior study, though pneumonia could have a similar appearance. Electronically Signed   By: Garald Balding M.D.   On: 10/26/2015 00:06   Dg Abd 2 Views  10/29/2015  CLINICAL DATA:  Partial small bowel obstruction. EXAM: ABDOMEN - 2 VIEW COMPARISON:  10/26/2015 FINDINGS: Nasogastric tube no longer visualized. Gaseous dilatation of small bowel remains with some air also present throughout most of the colon. Dilatation of small bowel slightly worse compared to 3/14. Findings remain consistent with partial small bowel obstruction versus generalized ileus. Decubitus film shows no evidence of free intraperitoneal air. No abnormal calcifications identified. IMPRESSION: Slight worsening of partial small bowel obstruction versus ileus. Electronically Signed   By: Aletta Edouard M.D.   On: 10/29/2015 11:39   Dg Abd Portable 1v  10/26/2015  CLINICAL DATA:  Followup partial small bowel obstruction. EXAM: PORTABLE ABDOMEN - 1 VIEW COMPARISON:  10/20/2015 and earlier, including CT abdomen and pelvis 10/15/2015. FINDINGS: Persisting gaseous distention of multiple loops of small bowel in the mid abdomen. Gas and stool in normal caliber colon. Nasogastric tube looped in the stomach with its tip in the distal body. No suggestion of free air on the supine image. Prior cholecystectomy. Developing  airspace opacities in both lung bases. IMPRESSION: 1. No significant change since yesterday in the partial small bowel obstruction. 2. Developing pneumonia in both lung bases. Electronically Signed   By: Evangeline Dakin M.D.   On: 10/26/2015 07:58   Dg Abd Portable 1v  10/20/2015  CLINICAL DATA:  Followup small bowel obstruction. EXAM: PORTABLE ABDOMEN - 1 VIEW COMPARISON:  10/17/2015.  CT, 10/15/2015. FINDINGS: Mild dilation of small bowel is seen centrally. There is air in stool in the colon as well as some contrast in the left colon. The degree of small bowel dilation is mildly decreased from the prior exam. No new abnormalities. IMPRESSION: 1. Mild improvement in the partial small bowel obstruction with less small bowel dilation noted on the current study. Electronically Signed   By: Lajean Manes M.D.   On: 10/20/2015 11:56    ASSESSMENT/PLAN:  Generalized weakness - for rehabilitation  HCAP - initially treated with vancomycin and Zosyn, then transition to Rocephin and IV for a total of 6 days IV antibiotics; Mucinex 600 mg every 12 hours 2 weeks  UTI - continue Cipro 500 mg twice a day 8 days  COPD - stable; continue Allegra 2 puffs twice a day, albuterol neb when necessary, prednisone taper, DuoNeb via nebulization 3 times a day and Brovana 2 puffs every 12 hours  Chronic systolic CHF - continue Lasix 40 mg daily, Coreg 12.5 mg twice a day, aspirin 81 mg by mouth daily and Cozaar 25 mg 1 tab by mouth daily; check BMP  Anemia of iron deficiency - hemoglobin 8.4; continue ferrous sulfate 325 mg 1 tab by mouth twice a day; not a candidate for any invasive work of given his multiple co morbidities; check CBC  Diabetes mellitus, type II - hemoglobin A1c 7.3; continue Lantus 5 units subcutaneous daily at bedtime and NovoLog sliding scale TID  Sacral pressure ulcer stage II - continue wound treatment  Hypertension - continue Cozaar 20 mg 1 tab by mouth daily, Coreg  GERD - continue  Prilosec 40 mg daily  Neuropathy - continue Lyrica 75 mg 1 capsule by mouth twice a day  OAB - continue Myrbetriq 50 mg 1 tab by mouth daily  Hyperlipidemia - continue Zocor 10 mg by mouth daily; check  lipid panel  Chronic constipation - continue MiraLAX 17 g twice a day, Amitiza 145 g daily and senna S1 tab by mouth twice a day  Protein calorie malnutrition, severe - albumin 2.6; start Procel 2 scoops by mouth twice a day, RD consult     Goals of care:  Short-term rehabilitation    [facility] [place]

## 2015-11-17 ENCOUNTER — Encounter: Payer: Self-pay | Admitting: Internal Medicine

## 2015-11-17 ENCOUNTER — Non-Acute Institutional Stay (SKILLED_NURSING_FACILITY): Payer: Medicare Other | Admitting: Internal Medicine

## 2015-11-17 DIAGNOSIS — R531 Weakness: Secondary | ICD-10-CM

## 2015-11-17 DIAGNOSIS — L8992 Pressure ulcer of unspecified site, stage 2: Secondary | ICD-10-CM | POA: Diagnosis not present

## 2015-11-17 DIAGNOSIS — K5909 Other constipation: Secondary | ICD-10-CM

## 2015-11-17 DIAGNOSIS — R1310 Dysphagia, unspecified: Secondary | ICD-10-CM | POA: Diagnosis not present

## 2015-11-17 DIAGNOSIS — J189 Pneumonia, unspecified organism: Secondary | ICD-10-CM

## 2015-11-17 DIAGNOSIS — N183 Chronic kidney disease, stage 3 unspecified: Secondary | ICD-10-CM

## 2015-11-17 DIAGNOSIS — E46 Unspecified protein-calorie malnutrition: Secondary | ICD-10-CM

## 2015-11-17 DIAGNOSIS — E1149 Type 2 diabetes mellitus with other diabetic neurological complication: Secondary | ICD-10-CM

## 2015-11-17 DIAGNOSIS — I5022 Chronic systolic (congestive) heart failure: Secondary | ICD-10-CM

## 2015-11-17 DIAGNOSIS — I10 Essential (primary) hypertension: Secondary | ICD-10-CM | POA: Diagnosis not present

## 2015-11-17 DIAGNOSIS — N3281 Overactive bladder: Secondary | ICD-10-CM | POA: Diagnosis not present

## 2015-11-17 DIAGNOSIS — H8113 Benign paroxysmal vertigo, bilateral: Secondary | ICD-10-CM | POA: Diagnosis not present

## 2015-11-17 DIAGNOSIS — D638 Anemia in other chronic diseases classified elsewhere: Secondary | ICD-10-CM

## 2015-11-17 DIAGNOSIS — K5900 Constipation, unspecified: Secondary | ICD-10-CM

## 2015-11-17 DIAGNOSIS — B9620 Unspecified Escherichia coli [E. coli] as the cause of diseases classified elsewhere: Secondary | ICD-10-CM

## 2015-11-17 DIAGNOSIS — J441 Chronic obstructive pulmonary disease with (acute) exacerbation: Secondary | ICD-10-CM | POA: Diagnosis not present

## 2015-11-17 DIAGNOSIS — K219 Gastro-esophageal reflux disease without esophagitis: Secondary | ICD-10-CM

## 2015-11-17 DIAGNOSIS — N390 Urinary tract infection, site not specified: Secondary | ICD-10-CM

## 2015-11-17 NOTE — Progress Notes (Signed)
LOCATION: [facility]  PCP:  Melinda Crutch, MD   Code Status: Full Code   Goals of care: Advanced Directive information Advanced Directives 11/09/2015  Does patient have an advance directive? Yes  Type of Advance Directive Living will;Healthcare Power of Attorney  Does patient want to make changes to advanced directive? No - Patient declined  Copy of advanced directive(s) in chart? No - copy requested  Would patient like information on creating an advanced directive? -       Extended Emergency Contact Information Primary Emergency Contact: Simpkins,Cindy Address: [place], [place] [address] Johnnette Litter of [place] Phone: [phone] Relation: Daughter Secondary Emergency Contact: Earleen [place] Work Phone: [phone] Relation: Son   Allergies  Allergen Reactions  . Clopidogrel Bisulfate Hives    REACTION: red blue, black blotches  . Dilaudid [Hydromorphone Hcl] Hives    Hallucinations, little green men working on the building  . Penicillins Hives    Has patient had a PCN reaction causing immediate rash, facial/tongue/throat swelling, SOB or lightheadedness with hypotension: Yes Has patient had a PCN reaction causing severe rash involving mucus membranes or skin necrosis: No Has patient had a PCN reaction that required hospitalization No Has patient had a PCN reaction occurring within the last 10 years: No If all of the above answers are "NO", then may proceed with Cephalosporin use.   . Sulfa Antibiotics Other (See Comments)    Nightmares, hallucinations  . Sulfonamide Derivatives Other (See Comments)    Nightmares, hallucinations  . Doxycycline Hyclate Rash    Chief Complaint  Patient presents with  . New Admit To SNF    New Admission     HPI:  Patient is a 82 y.o. male seen today for short term rehabilitation post hospital admission from 11/09/15-11/15/15 with acute respiratory failure from HCAP and COPD  exacerbation. He also had e.coli UTI. He was started on antibiotics, steroid and bronchodilators. He has PMH of COPD, CKD stage 3, HTN, systolic CHF among others. He is seen in his room today. He is hard of hearing. He is on o2 by nasal canula. He feels weak and tired.   Review of Systems:  Constitutional: Negative for fever, chills, diaphoresis. Energy is slowly coming back.  HENT: Negative for headache, congestion, nasal discharge, sore throat. Positive for difficulty swallowing.   Eyes: Negative for blurred vision, double vision and discharge. Wears glasses.  Respiratory: positive for cough. Negative for shortness of breath and wheezing.   Cardiovascular: Negative for chest pain, palpitations, leg swelling.  Gastrointestinal: Negative for heartburn, nausea, vomiting, abdominal pain. Last bowel movement was yesterday.  Genitourinary: Negative for dysuria and flank pain.  Musculoskeletal: Negative for back pain, fall.  Skin: Negative for itching, rash.  Neurological: Negative for weakness. Positive for dizziness with change in position. Psychiatric/Behavioral: Negative for depression  Past Medical History  Diagnosis Date  . Carotid artery stenosis   . Essential hypertension   . Myocardial infarction ([facility]) 1979  . Coronary artery disease     Known occluded proximal LAD with collaterals, moderate RCA disease - managed medically 2009  . COPD (chronic obstructive pulmonary disease) ([facility])   . History of stroke   . Prostate cancer ([facility])   . Arthritis   . Foot drop, right   . Neuropathy ([facility])   . Anemia   . Urinary incontinence, male, stress   . GERD (gastroesophageal reflux disease)   .  Thalassemia   . Wears dentures   . Deafness in left ear     Wears hearing aide in right ear  . Sleep apnea     CPAP  . Ischemic cardiomyopathy     LVEF 40-45%  . Hypercholesteremia   . Anemia   . Osteoarthritis   . Stroke ([facility])   . Heart attack ([facility])   . Vertigo   . Hereditary and idiopathic  peripheral neuropathy 03/01/2015  . Abnormality of gait 03/01/2015   Past Surgical History  Procedure Laterality Date  . Penile prosthesis implant    . Cholecystectomy    . Right carotid endarterectomy    . Back surgery    . Lumbar laminectomy  03/24/10    L3-4 and L4-5  . Ankle surgery      Bone infection  . Cardiac catheterization    . Appendectomy    . Joint replacement Left   . Radioactive seed implant  2009  . Pars plana vitrectomy  03/19/2012    Procedure: PARS PLANA VITRECTOMY WITH 25 GAUGE;  Surgeon: Hayden Pedro, MD;  Location: [facility];  Service: Ophthalmology;  Laterality: Right;  . Carotid endarterectomy Right     CEA - 15 yrs ago  . Spine surgery    . Eye surgery    . Cataract extraction w/ intraocular lens implant Left   . Endarterectomy Left 09/28/2014    Procedure: Left Carotid Endarterectomy;  Surgeon: Rosetta Posner, MD;  Location: [facility];  Service: Vascular;  Laterality: Left;  . Patch angioplasty Left 09/28/2014    Procedure: WITH DACRON PATCH ANGIOPLASTY;  Surgeon: Rosetta Posner, MD;  Location: [facility];  Service: Vascular;  Laterality: Left;   Social History:   reports that he quit smoking about 38 years ago. His smoking use included Cigarettes. He has a 20 pack-year smoking history. He has never used smokeless tobacco. He reports that he does not drink alcohol or use illicit drugs.  Family History  Problem Relation Age of Onset  . Thalassemia      Family history  . Cancer Mother 28    Bone cancer  . Diabetes Mother   . Heart disease Mother   . Heart attack Mother   . Hypertension Mother   . Stroke Father 23  . Heart disease Father   . Hypertension Father   . Hypertension Sister   . Diabetes Sister   . Heart disease Brother   . Heart attack Brother 44  . Cancer Brother     Medications:   Medication List       This list is accurate as of: 11/17/15  2:42 PM.  Always use your most recent med list.               acidophilus Caps capsule  Take 2  capsules by mouth daily.     albuterol (2.5 MG/3ML) 0.083% nebulizer solution  Commonly known as:  PROVENTIL  Take 3 mLs (2.5 mg total) by nebulization every 6 (six) hours as needed for wheezing.     aspirin EC 81 MG tablet  Take 81 mg by mouth daily.     carvedilol 12.5 MG tablet  Commonly known as:  COREG  TAKE 1 TABLET TWICE DAILY WITH A MEAL     ciprofloxacin 500 MG tablet  Commonly known as:  CIPRO  Take 1 tablet (500 mg total) by mouth 2 (two) times daily. Please take for total of 8 days.     ferrous sulfate  325 (65 FE) MG tablet  Take 1 tablet (325 mg total) by mouth 2 (two) times daily with a meal.     furosemide 40 MG tablet  Commonly known as:  LASIX  Take 40 mg by mouth.     Glycopyrrolate-Formoterol 9-4.8 MCG/ACT Aero  Commonly known as:  BEVESPI AEROSPHERE  Inhale 2 puffs into the lungs every 12 (twelve) hours.     guaifenesin 100 MG/5ML syrup  Commonly known as:  ROBITUSSIN  Take 10 mg by mouth every 4 (four) hours as needed for cough.     guaiFENesin 600 MG 12 hr tablet  Commonly known as:  MUCINEX  Take 1 tablet (600 mg total) by mouth 2 (two) times daily. Please take for 2 weeks.     insulin glargine 100 UNIT/ML injection  Commonly known as:  LANTUS  Inject 0.05 mLs (5 Units total) into the skin daily.     ipratropium-albuterol 0.5-2.5 (3) MG/3ML Soln  Commonly known as:  DUONEB  Take 3 mLs by nebulization 3 (three) times daily.     LINZESS 145 MCG Caps capsule  Generic drug:  Linaclotide  Take 1 capsule by mouth daily with breakfast.     losartan 25 MG tablet  Commonly known as:  COZAAR  Take 1 tablet (25 mg total) by mouth daily.     magic mouthwash Soln  Take 5 mLs by mouth 4 (four) times daily.     meclizine 25 MG tablet  Commonly known as:  ANTIVERT  Take 1 tablet (25 mg total) by mouth daily as needed for dizziness.     mometasone-formoterol 200-5 MCG/ACT Aero  Commonly known as:  DULERA  Inhale 2 puffs into the lungs 2 (two) times  daily.     MYRBETRIQ 50 MG Tb24 tablet  Generic drug:  mirabegron ER  Take 50 mg by mouth daily.     NITROSTAT 0.4 MG SL tablet  Generic drug:  nitroGLYCERIN  PLACE 1 TABLET (0.4 MG TOTAL) UNDER THE TONGUE EVERY 5 (FIVE) MINUTES AS NEEDED. FOR CHEST PAIN     NOVOLOG 100 UNIT/ML injection  Generic drug:  insulin aspart  Inject 0-9 Units into the skin. For CBGs 0-69 = 0 units, 70-120 = 0 units, 121-150 = 1 unit, 151-200 = 2 units, 201-250 = 3 units, 252-300 = 5 units, 301-350 = 7 units, 351-400 = 9 units     omeprazole 20 MG capsule  Commonly known as:  PRILOSEC  Take 40 mg by mouth daily.     polyethylene glycol packet  Commonly known as:  MIRALAX / GLYCOLAX  Take 17 g by mouth 2 (two) times daily.     predniSONE 10 MG tablet  Commonly known as:  DELTASONE  Please take 3 tablets for 3 days, then 2 tablets for 3 days, then 1 tablet for 3 days, then stop     pregabalin 75 MG capsule  Commonly known as:  LYRICA  Take 75 mg by mouth 2 (two) times daily.     PROCEL Powd  Take 2 scoop by mouth 2 (two) times daily.     senna-docusate 8.6-50 MG tablet  Commonly known as:  Senokot-S  Take 1 tablet by mouth 2 (two) times daily.     simvastatin 10 MG tablet  Commonly known as:  ZOCOR  Take 10 mg by mouth daily at 6 PM.     TYLENOL 325 MG tablet  Generic drug:  acetaminophen  Take 650 mg by mouth every 6 (six)  hours as needed for mild pain.        Immunizations: Immunization History  Administered Date(s) Administered  . Influenza Split 05/14/2013, 05/14/2014  . Influenza,inj,Quad PF,36+ Mos 07/19/2015  . PPD Test 11/02/2015     Physical Exam: Filed Vitals:   11/17/15 1426  BP: 154/64  Pulse: 75  Temp: 97.8 F (36.6 C)  TempSrc: Oral  Resp: 16  Height: 6' (1.829 m)  Weight: 164 lb (74.39 kg)  SpO2: 95%   Body mass index is 22.24 kg/(m^2).  General- elderly male, well built, in no acute distress Head- normocephalic, atraumatic Nose- no maxillary or frontal  sinus tenderness, no nasal discharge Throat- moist mucus membrane, upper dentures, poor lower dentition Eyes- PERRLA, EOMI, no pallor, no icterus, no discharge, normal conjunctiva, normal sclera Neck- no cervical lymphadenopathy Cardiovascular- normal s1,s2, no murmur, trace left and 1+ right leg edema Respiratory- bilateral poor air movement, no wheeze, no rhonchi, no crackles, no use of accessory muscles, on o2 Abdomen- bowel sounds present, soft, non tender Musculoskeletal- able to move all 4 extremities, generalized weakness Neurological- alert and oriented to person, place and time Skin- warm and dry, stage 1 heel ulcer and stage 2 coccyx ulcer Psychiatry- normal mood and affect    Labs reviewed: Basic Metabolic Panel:  Recent Labs  10/31/15 0549 11/01/15 0453 11/02/15 0512  11/12/15 0523 11/13/15 0537 11/15/15 11/15/15 0529  NA 141 141 141  < > 139 141 141 141  K 4.2 3.9 3.8  < > 3.7 3.3*  --  3.5  CL 106 105 105  < > 102 100*  --  99*  CO2 27 29 30   < > 29 32  --  34*  GLUCOSE 188* 177* 150*  < > 157* 144*  --  136*  BUN 35* 35* 31*  < > 32* 33* 28* 28*  CREATININE 1.28*  1.16 1.16 1.29*  < > 1.38* 1.10 1.1 1.14  CALCIUM 8.3* 8.3* 8.1*  < > 8.2* 8.3*  --  8.5*  MG 2.0 2.0 2.1  --   --   --   --   --   PHOS 3.5 3.2 2.3*  --   --   --   --   --   < > = values in this interval not displayed. Liver Function Tests:  Recent Labs  09/21/15 0501 10/24/15 1358  11/01/15 0453 11/02/15 0512 11/09/15 1923  AST 15 18  --   --   --  14*  ALT 9* 12*  --   --   --  16*  ALKPHOS 73 56  --   --   --  60  BILITOT 0.7 0.8  --   --   --  0.4  PROT 5.8* 6.1*  --   --   --  6.0*  ALBUMIN 2.9* 3.1*  < > 2.6* 2.4* 2.6*  < > = values in this interval not displayed.  Recent Labs  01/08/15 0725  LIPASE 15*   No results for input(s): AMMONIA in the last 8760 hours. CBC:  Recent Labs  11/02/15 0512 11/09/15 1923 11/10/15 0508  11/12/15 0523 11/13/15 0537 11/15/15  11/15/15 0529  WBC 7.1 8.6 10.1  < > 8.9 8.1 7.7 7.7  NEUTROABS 5.7 8.0* 8.8*  --   --   --   --   --   HGB 8.5* 8.5* 8.5*  < > 7.4* 7.7*  --  8.4*  HCT 27.8* 27.7* 27.5*  < > 23.4*  25.2*  --  26.8*  MCV 63.2* 61.7* 60.6*  < > 60.6* 60.1*  --  61.3*  PLT 179 110* 121*  < > 92* 111*  --  149*  < > = values in this interval not displayed. Cardiac Enzymes:  Recent Labs  01/08/15 2050 10/26/15 0245 11/09/15 1923  TROPONINI 0.23* 0.06* 0.03   BNP: Invalid input(s): POCBNP CBG:  Recent Labs  11/14/15 2104 11/15/15 0734 11/15/15 1154  GLUCAP 218* 116* 231*    Radiological Exams: Dg Chest 2 View  11/09/2015  CLINICAL DATA:  Shortness of breath. EXAM: CHEST  2 VIEW COMPARISON:  10/28/2015 chest radiograph. FINDINGS: Surgical clips in the medial lower right neck. Stable cardiomediastinal silhouette with normal heart size. No pneumothorax. No pleural effusion. Patchy consolidation throughout both lower lobes. No pulmonary edema. IMPRESSION: Patchy bilateral lower lobe consolidation, most suggestive of multilobar pneumonia. Recommend follow-up PA and lateral post treatment chest radiographs in 4-6 weeks. Electronically Signed   By: Ilona Sorrel M.D.   On: 11/09/2015 20:38   Dg Chest 2 View  10/25/2015  CLINICAL DATA:  Shortness of breath and cough EXAM: CHEST  2 VIEW COMPARISON:  10/24/15 FINDINGS: Cardiac shadow is stable. The lungs are well aerated bilaterally. Small effusions are again noted. No focal confluent infiltrate is seen. IMPRESSION: Small bilateral pleural effusions. Improved aeration in the bases is noted. Electronically Signed   By: Inez Catalina M.D.   On: 10/25/2015 10:07   Dg Chest 2 View  10/24/2015  CLINICAL DATA:  Productive cough for 2-3 days. EXAM: CHEST - 2 VIEW COMPARISON:  Two-view chest x-ray 09/20/2015. FINDINGS: The heart size is normal. Small bilateral pleural effusions are now present. Mild bibasilar airspace opacities are evident posteriorly. The upper lung  fields are clear. Emphysematous changes are again noted. Atherosclerotic calcifications are present in the aortic arch. Surgical clips are present in the right neck and at the gallbladder fossa. IMPRESSION: 1. New bilateral pleural effusions and posterior lower lobe airspace disease. While this may represent atelectasis, infection is not excluded. 2. Emphysema. 3. Atherosclerosis of the thoracic aorta. Electronically Signed   By: San Morelle M.D.   On: 10/24/2015 13:38   Ct Chest Wo Contrast  11/10/2015  CLINICAL DATA:  Shortness of breath and cough EXAM: CT CHEST WITHOUT CONTRAST TECHNIQUE: Multidetector CT imaging of the chest was performed following the standard protocol without IV contrast. COMPARISON:  11/09/2015 FINDINGS: The lungs are well aerated bilaterally. Patchy infiltrates are seen bilaterally worst in the right middle and right lower lobes consistent with acute pneumonia. Some scattered more nodular appearing infiltrates are seen as well. One of these nodular areas measures approximately 6 mm in the right upper lobe best seen on image number 30 of series 5. Second somewhat nodular area is noted on the left measuring 12 mm is in dimension. This is best seen on image number 27 of series 5. A small right-sided pleural effusion is seen. The thoracic inlet is within normal limits. Aortic calcifications are noted. No significant hilar or mediastinal adenopathy is seen. Some calcification in the left ventricular wall is noted likely related to prior infarct. The visualized upper abdomen shows changes of prior cholecystectomy. A left renal cyst is noted as well. Degenerative changes of the thoracic spine are noted. IMPRESSION: Infiltrative changes in both lungs worst in the right middle and right lower lobes. Some nodularity is noted in scattered areas throughout both lungs. This all again likely represents an acute pneumonic process. Short-term followup  following appropriate therapy with  noncontrast CT of the chest is recommended to assess for resolution or stability. This could be performed in 4-6 weeks Electronically Signed   By: Inez Catalina M.D.   On: 11/10/2015 12:44   Dg Chest Port 1 View  10/28/2015  CLINICAL DATA:  COPD exacerbation.  Short of breath EXAM: PORTABLE CHEST 1 VIEW COMPARISON:  10/26/2015 FINDINGS: Endotracheal tube and NG tubes have been removed. Bilateral airspace disease in the bases left greater than right shows interval improvement. Minimal pleural effusion bilaterally. IMPRESSION: Interval improvement in bilateral airspace disease consistent with clearing edema. Endotracheal tube removed in the interval. Electronically Signed   By: Franchot Gallo M.D.   On: 10/28/2015 07:33   Dg Chest Port 1 View  10/26/2015  CLINICAL DATA:  Intubation EXAM: PORTABLE CHEST 1 VIEW COMPARISON:  Yesterday FINDINGS: New endotracheal tube with tip between the clavicular heads and carina. An orogastric tube reaches stomach at least. Diffuse interstitial opacities are improved. Kerley lines are still noted. There is asymmetric airspace disease to the left. No definitive effusion. When accounting for a skin fold on the left there is no visible pneumothorax. IMPRESSION: 1. New endotracheal and orogastric tubes are in good position. 2. Pulmonary edema with improvement since earlier. 3. Airspace disease which could be asymmetric alveolar edema or superimposed pneumonia. Electronically Signed   By: Monte Fantasia M.D.   On: 10/26/2015 02:00   Dg Chest Port 1 View  10/26/2015  CLINICAL DATA:  Acute onset of shortness of breath. Initial encounter. EXAM: PORTABLE CHEST 1 VIEW COMPARISON:  Chest radiograph performed earlier today at 9:24 a.m. FINDINGS: The lungs are hyperexpanded. The lung bases are incompletely imaged on this study. Vascular congestion is noted. Bibasilar airspace opacities likely reflect pulmonary edema, significantly worsened from the recent prior study, though pneumonia  could have a similar appearance. No definite pleural effusion or pneumothorax is seen. The cardiomediastinal silhouette is within normal limits. No acute osseous abnormalities are seen. IMPRESSION: Lungs hypoexpanded. Vascular congestion noted. Bibasilar airspace opacities likely reflect pulmonary edema, significantly worsened from the prior study, though pneumonia could have a similar appearance. Electronically Signed   By: Garald Balding M.D.   On: 10/26/2015 00:06   Dg Abd 2 Views  10/29/2015  CLINICAL DATA:  Partial small bowel obstruction. EXAM: ABDOMEN - 2 VIEW COMPARISON:  10/26/2015 FINDINGS: Nasogastric tube no longer visualized. Gaseous dilatation of small bowel remains with some air also present throughout most of the colon. Dilatation of small bowel slightly worse compared to 3/14. Findings remain consistent with partial small bowel obstruction versus generalized ileus. Decubitus film shows no evidence of free intraperitoneal air. No abnormal calcifications identified. IMPRESSION: Slight worsening of partial small bowel obstruction versus ileus. Electronically Signed   By: Aletta Edouard M.D.   On: 10/29/2015 11:39   Dg Abd Portable 1v  10/26/2015  CLINICAL DATA:  Followup partial small bowel obstruction. EXAM: PORTABLE ABDOMEN - 1 VIEW COMPARISON:  10/20/2015 and earlier, including CT abdomen and pelvis 10/15/2015. FINDINGS: Persisting gaseous distention of multiple loops of small bowel in the mid abdomen. Gas and stool in normal caliber colon. Nasogastric tube looped in the stomach with its tip in the distal body. No suggestion of free air on the supine image. Prior cholecystectomy. Developing airspace opacities in both lung bases. IMPRESSION: 1. No significant change since yesterday in the partial small bowel obstruction. 2. Developing pneumonia in both lung bases. Electronically Signed   By: Sherran Needs.D.  On: 10/26/2015 07:58   Dg Abd Portable 1v  10/20/2015  CLINICAL DATA:   Followup small bowel obstruction. EXAM: PORTABLE ABDOMEN - 1 VIEW COMPARISON:  10/17/2015.  CT, 10/15/2015. FINDINGS: Mild dilation of small bowel is seen centrally. There is air in stool in the colon as well as some contrast in the left colon. The degree of small bowel dilation is mildly decreased from the prior exam. No new abnormalities. IMPRESSION: 1. Mild improvement in the partial small bowel obstruction with less small bowel dilation noted on the current study. Electronically Signed   By: Lajean Manes M.D.   On: 10/20/2015 11:56    Assessment/Plan  Generalized weakness Will have him work with physical therapy and occupational therapy team to help with gait training and muscle strengthening exercises.fall precautions. Skin care. Encourage to be out of bed.   HCAP Completed his antibiotic treatment, encouraged incentive spirometer use. Continue bronchodilator therapy. Get CXR to assess for resolution of infiltrate in 2 weeks  COPD exacerbation Continue and complete tapering course of prednisone, continue dulera and prn duoneb. Wean off o2 to room air as tolerated  E.coli UTI  continue Ciprofloxacin 500 mg bid and monitor clinically  Chronic systolic CHF continue Lasix 40 mg daily, Coreg 12.5 mg bid and Cozaar 25 mg daily. Monitor daily weight. Check bmp  Stage 2 sacral PU Continue wound care and pressure ulcer prophylaxis, gel wheelchair cushion  Protein calorie malnutrition Monitor weight, continue procel supplement  HTN Elevated bp, check bp bid. Continue coreg and cozaar for now  Anemia of chronic disease Monitor cbc, continue iron supplement  gerd Stable, continue prilosec  Diabetes mellitus with neuropathy Monitor cbg. continue Lantus 5 units daily and SSI novolog. Continue lyrica 75 mg bid for neuropathic pain Lab Results  Component Value Date   HGBA1C 7.3* 11/10/2015    Dysphagia Get SLP consult  ckd stage 3 Monitor bmp  OAB continue Myrbetriq 50 mg  daily  Chronic constipation Continue miralax bid with linzess and senna, monitor  Vertigo Stable, continue prn meclizine  Goals of care: short term rehabilitation   Labs/tests ordered: cbc, bmp  Family/ staff Communication: reviewed care plan with patient and nursing supervisor    Blanchie Serve, MD Internal Medicine [place], [place] [address] Cell Phone (Monday-Friday 8 am - 5 pm): [phone] On Call: [phone] and follow prompts after 5 pm and on weekends Office Phone: [phone] Office Fax: [phone]

## 2015-11-27 ENCOUNTER — Inpatient Hospital Stay (HOSPITAL_COMMUNITY)
Admission: EM | Admit: 2015-11-27 | Discharge: 2015-12-03 | DRG: 177 | Disposition: A | Discharge status: Hospice | Payer: Medicare Other | Attending: Internal Medicine | Admitting: Internal Medicine

## 2015-11-27 ENCOUNTER — Encounter (HOSPITAL_COMMUNITY): Payer: Self-pay | Admitting: Emergency Medicine

## 2015-11-27 ENCOUNTER — Emergency Department (HOSPITAL_COMMUNITY): Payer: Medicare Other

## 2015-11-27 DIAGNOSIS — J690 Pneumonitis due to inhalation of food and vomit: Principal | ICD-10-CM | POA: Diagnosis present

## 2015-11-27 DIAGNOSIS — I255 Ischemic cardiomyopathy: Secondary | ICD-10-CM | POA: Diagnosis present

## 2015-11-27 DIAGNOSIS — J441 Chronic obstructive pulmonary disease with (acute) exacerbation: Secondary | ICD-10-CM | POA: Diagnosis present

## 2015-11-27 DIAGNOSIS — H9192 Unspecified hearing loss, left ear: Secondary | ICD-10-CM | POA: Diagnosis present

## 2015-11-27 DIAGNOSIS — J440 Chronic obstructive pulmonary disease with acute lower respiratory infection: Secondary | ICD-10-CM | POA: Diagnosis present

## 2015-11-27 DIAGNOSIS — J9601 Acute respiratory failure with hypoxia: Secondary | ICD-10-CM | POA: Diagnosis present

## 2015-11-27 DIAGNOSIS — R1311 Dysphagia, oral phase: Secondary | ICD-10-CM | POA: Diagnosis present

## 2015-11-27 DIAGNOSIS — L98429 Non-pressure chronic ulcer of back with unspecified severity: Secondary | ICD-10-CM | POA: Diagnosis present

## 2015-11-27 DIAGNOSIS — Z9049 Acquired absence of other specified parts of digestive tract: Secondary | ICD-10-CM

## 2015-11-27 DIAGNOSIS — L89152 Pressure ulcer of sacral region, stage 2: Secondary | ICD-10-CM | POA: Diagnosis present

## 2015-11-27 DIAGNOSIS — N183 Chronic kidney disease, stage 3 (moderate): Secondary | ICD-10-CM | POA: Diagnosis present

## 2015-11-27 DIAGNOSIS — R0602 Shortness of breath: Secondary | ICD-10-CM

## 2015-11-27 DIAGNOSIS — E1122 Type 2 diabetes mellitus with diabetic chronic kidney disease: Secondary | ICD-10-CM | POA: Diagnosis present

## 2015-11-27 DIAGNOSIS — G629 Polyneuropathy, unspecified: Secondary | ICD-10-CM | POA: Diagnosis present

## 2015-11-27 DIAGNOSIS — I130 Hypertensive heart and chronic kidney disease with heart failure and stage 1 through stage 4 chronic kidney disease, or unspecified chronic kidney disease: Secondary | ICD-10-CM | POA: Diagnosis present

## 2015-11-27 DIAGNOSIS — K219 Gastro-esophageal reflux disease without esophagitis: Secondary | ICD-10-CM | POA: Diagnosis present

## 2015-11-27 DIAGNOSIS — E7800 Pure hypercholesterolemia, unspecified: Secondary | ICD-10-CM | POA: Diagnosis present

## 2015-11-27 DIAGNOSIS — Z66 Do not resuscitate: Secondary | ICD-10-CM | POA: Diagnosis present

## 2015-11-27 DIAGNOSIS — Z79899 Other long term (current) drug therapy: Secondary | ICD-10-CM

## 2015-11-27 DIAGNOSIS — Z7982 Long term (current) use of aspirin: Secondary | ICD-10-CM

## 2015-11-27 DIAGNOSIS — I5022 Chronic systolic (congestive) heart failure: Secondary | ICD-10-CM | POA: Diagnosis present

## 2015-11-27 DIAGNOSIS — Z794 Long term (current) use of insulin: Secondary | ICD-10-CM

## 2015-11-27 DIAGNOSIS — I2510 Atherosclerotic heart disease of native coronary artery without angina pectoris: Secondary | ICD-10-CM | POA: Diagnosis present

## 2015-11-27 DIAGNOSIS — Z87891 Personal history of nicotine dependence: Secondary | ICD-10-CM

## 2015-11-27 DIAGNOSIS — Z8249 Family history of ischemic heart disease and other diseases of the circulatory system: Secondary | ICD-10-CM

## 2015-11-27 DIAGNOSIS — E611 Iron deficiency: Secondary | ICD-10-CM | POA: Diagnosis present

## 2015-11-27 DIAGNOSIS — J189 Pneumonia, unspecified organism: Secondary | ICD-10-CM | POA: Diagnosis present

## 2015-11-27 DIAGNOSIS — Z833 Family history of diabetes mellitus: Secondary | ICD-10-CM

## 2015-11-27 DIAGNOSIS — R1313 Dysphagia, pharyngeal phase: Secondary | ICD-10-CM | POA: Diagnosis present

## 2015-11-27 DIAGNOSIS — I252 Old myocardial infarction: Secondary | ICD-10-CM

## 2015-11-27 DIAGNOSIS — Z7189 Other specified counseling: Secondary | ICD-10-CM | POA: Insufficient documentation

## 2015-11-27 DIAGNOSIS — Z515 Encounter for palliative care: Secondary | ICD-10-CM | POA: Insufficient documentation

## 2015-11-27 LAB — GLUCOSE, CAPILLARY
GLUCOSE-CAPILLARY: 168 mg/dL — AB (ref 65–99)
GLUCOSE-CAPILLARY: 145 mg/dL — AB (ref 65–99)

## 2015-11-27 LAB — CBC WITH DIFFERENTIAL/PLATELET
Basophils Absolute: 0 10*3/uL (ref 0.0–0.1)
Basophils Relative: 0 %
EOS PCT: 1 %
Eosinophils Absolute: 0.1 10*3/uL (ref 0.0–0.7)
HEMATOCRIT: 28.6 % — AB (ref 39.0–52.0)
HEMOGLOBIN: 8.9 g/dL — AB (ref 13.0–17.0)
LYMPHS PCT: 9 %
Lymphs Abs: 0.6 10*3/uL — ABNORMAL LOW (ref 0.7–4.0)
MCH: 19.5 pg — ABNORMAL LOW (ref 26.0–34.0)
MCHC: 31.1 g/dL (ref 30.0–36.0)
MCV: 62.7 fL — AB (ref 78.0–100.0)
MONO ABS: 0.5 10*3/uL (ref 0.1–1.0)
MONOS PCT: 7 %
NEUTROS PCT: 83 %
Neutro Abs: 5.7 10*3/uL (ref 1.7–7.7)
PLATELETS: 170 10*3/uL (ref 150–400)
RBC: 4.56 MIL/uL (ref 4.22–5.81)
RDW: 18.2 % — ABNORMAL HIGH (ref 11.5–15.5)
WBC: 6.9 10*3/uL (ref 4.0–10.5)

## 2015-11-27 LAB — EXPECTORATED SPUTUM ASSESSMENT W REFEX TO RESP CULTURE

## 2015-11-27 LAB — BASIC METABOLIC PANEL
ANION GAP: 9 (ref 5–15)
BUN: 22 mg/dL — ABNORMAL HIGH (ref 6–20)
CHLORIDE: 102 mmol/L (ref 101–111)
CO2: 30 mmol/L (ref 22–32)
Calcium: 8.7 mg/dL — ABNORMAL LOW (ref 8.9–10.3)
Creatinine, Ser: 1.04 mg/dL (ref 0.61–1.24)
GFR calc Af Amer: >60 mL/min (ref >60)
Glucose, Bld: 158 mg/dL — ABNORMAL HIGH (ref 65–99)
POTASSIUM: 3.9 mmol/L (ref 3.5–5.1)
Sodium: 141 mmol/L (ref 135–145)

## 2015-11-27 LAB — MRSA PCR SCREENING: MRSA BY PCR: POSITIVE — AB

## 2015-11-27 LAB — EXPECTORATED SPUTUM ASSESSMENT W GRAM STAIN, RFLX TO RESP C

## 2015-11-27 LAB — I-STAT CG4 LACTIC ACID, ED: Lactic Acid, Venous: 1.17 mmol/L (ref 0.5–2.0)

## 2015-11-27 LAB — STREP PNEUMONIAE URINARY ANTIGEN: STREP PNEUMO URINARY ANTIGEN: NEGATIVE

## 2015-11-27 MED ORDER — CEFTRIAXONE SODIUM 1 G IJ SOLR: 1.0000 g | INTRAMUSCULAR | Status: DC

## 2015-11-27 MED ORDER — CARVEDILOL 12.5 MG PO TABS
12.5000 mg | ORAL_TABLET | Freq: Two times a day (BID) | ORAL | Status: DC
  Administered 2015-11-27 – 2015-11-29 (×5): 12.5 mg via ORAL
  Filled 2015-11-27 (×7): qty 1

## 2015-11-27 MED ORDER — INSULIN GLARGINE 100 UNIT/ML ~~LOC~~ SOLN
5.0000 [IU] | Freq: Every day | SUBCUTANEOUS | Status: DC
  Administered 2015-11-27 – 2015-11-30 (×4): 5 [IU] via SUBCUTANEOUS
  Filled 2015-11-27 (×5): qty 0.05

## 2015-11-27 MED ORDER — ENOXAPARIN SODIUM 40 MG/0.4ML ~~LOC~~ SOLN
40.0000 mg | Freq: Every day | SUBCUTANEOUS | Status: DC
  Administered 2015-11-27 – 2015-12-02 (×6): 40 mg via SUBCUTANEOUS
  Filled 2015-11-27 (×6): qty 0.4

## 2015-11-27 MED ORDER — CHLORHEXIDINE GLUCONATE CLOTH 2 % EX PADS
6.0000 | MEDICATED_PAD | Freq: Every day | CUTANEOUS | Status: AC
  Administered 2015-11-28 – 2015-12-02 (×4): 6 via TOPICAL

## 2015-11-27 MED ORDER — MAGIC MOUTHWASH
5.0000 mL | Freq: Four times a day (QID) | ORAL | Status: DC
  Administered 2015-11-27 – 2015-12-02 (×13): 5 mL via ORAL
  Filled 2015-11-27 (×27): qty 5

## 2015-11-27 MED ORDER — DIPHENHYDRAMINE HCL 25 MG PO CAPS
25.0000 mg | ORAL_CAPSULE | Freq: Once | ORAL | Status: AC
  Administered 2015-11-27: 25 mg via ORAL
  Filled 2015-11-27: qty 1

## 2015-11-27 MED ORDER — VANCOMYCIN HCL 10 G IV SOLR
1250.0000 mg | INTRAVENOUS | Status: DC
  Administered 2015-11-27 – 2015-11-29 (×3): 1250 mg via INTRAVENOUS
  Filled 2015-11-27 (×5): qty 1250

## 2015-11-27 MED ORDER — MOMETASONE FURO-FORMOTEROL FUM 200-5 MCG/ACT IN AERO
2.0000 | INHALATION_SPRAY | Freq: Two times a day (BID) | RESPIRATORY_TRACT | Status: DC
  Administered 2015-11-27 – 2015-12-03 (×12): 2 via RESPIRATORY_TRACT
  Filled 2015-11-27: qty 8.8

## 2015-11-27 MED ORDER — SIMVASTATIN 10 MG PO TABS
10.0000 mg | ORAL_TABLET | Freq: Every day | ORAL | Status: DC
  Administered 2015-11-27 – 2015-11-30 (×4): 10 mg via ORAL
  Filled 2015-11-27 (×4): qty 1

## 2015-11-27 MED ORDER — POLYETHYLENE GLYCOL 3350 17 G PO PACK
17.0000 g | PACK | Freq: Two times a day (BID) | ORAL | Status: DC
  Administered 2015-11-27 – 2015-12-02 (×4): 17 g via ORAL
  Filled 2015-11-27 (×8): qty 1

## 2015-11-27 MED ORDER — IPRATROPIUM-ALBUTEROL 0.5-2.5 (3) MG/3ML IN SOLN
3.0000 mL | RESPIRATORY_TRACT | Status: DC | PRN
  Administered 2015-11-27 – 2015-12-01 (×3): 3 mL via RESPIRATORY_TRACT
  Filled 2015-11-27 (×3): qty 3

## 2015-11-27 MED ORDER — CEFTRIAXONE SODIUM 1 G IJ SOLR
1.0000 g | Freq: Once | INTRAMUSCULAR | Status: AC
  Administered 2015-11-27: 1 g via INTRAVENOUS
  Filled 2015-11-27: qty 10

## 2015-11-27 MED ORDER — ACETAMINOPHEN 325 MG PO TABS
650.0000 mg | ORAL_TABLET | Freq: Four times a day (QID) | ORAL | Status: DC | PRN
  Administered 2015-12-02: 650 mg via ORAL
  Filled 2015-11-27: qty 2

## 2015-11-27 MED ORDER — PANTOPRAZOLE SODIUM 40 MG PO TBEC
40.0000 mg | DELAYED_RELEASE_TABLET | Freq: Every day | ORAL | Status: DC
  Administered 2015-11-27 – 2015-12-02 (×5): 40 mg via ORAL
  Filled 2015-11-27 (×6): qty 1

## 2015-11-27 MED ORDER — INSULIN ASPART 100 UNIT/ML ~~LOC~~ SOLN
0.0000 [IU] | Freq: Three times a day (TID) | SUBCUTANEOUS | Status: DC
  Administered 2015-11-27: 1 [IU] via SUBCUTANEOUS
  Administered 2015-11-27 – 2015-11-28 (×3): 2 [IU] via SUBCUTANEOUS
  Administered 2015-11-28: 1 [IU] via SUBCUTANEOUS
  Administered 2015-11-29 – 2015-11-30 (×3): 2 [IU] via SUBCUTANEOUS
  Administered 2015-12-02: 3 [IU] via SUBCUTANEOUS
  Administered 2015-12-02 – 2015-12-03 (×2): 2 [IU] via SUBCUTANEOUS

## 2015-11-27 MED ORDER — ASPIRIN EC 81 MG PO TBEC
81.0000 mg | DELAYED_RELEASE_TABLET | Freq: Every day | ORAL | Status: DC
  Administered 2015-11-27 – 2015-12-02 (×5): 81 mg via ORAL
  Filled 2015-11-27 (×5): qty 1

## 2015-11-27 MED ORDER — DEXTROSE 5 % IV SOLN
1.0000 g | Freq: Three times a day (TID) | INTRAVENOUS | Status: DC
  Administered 2015-11-28 – 2015-11-29 (×4): 1 g via INTRAVENOUS
  Filled 2015-11-27 (×5): qty 1

## 2015-11-27 MED ORDER — NITROGLYCERIN 0.4 MG SL SUBL: 0.4000 mg | SUBLINGUAL_TABLET | SUBLINGUAL | Status: DC | PRN

## 2015-11-27 MED ORDER — RISAQUAD PO CAPS
2.0000 | ORAL_CAPSULE | Freq: Every day | ORAL | Status: DC
  Administered 2015-11-27 – 2015-12-02 (×5): 2 via ORAL
  Filled 2015-11-27 (×7): qty 2

## 2015-11-27 MED ORDER — PREDNISONE 50 MG PO TABS
50.0000 mg | ORAL_TABLET | Freq: Every day | ORAL | Status: DC
  Administered 2015-11-27 – 2015-11-28 (×2): 50 mg via ORAL
  Filled 2015-11-27 (×3): qty 1

## 2015-11-27 MED ORDER — RESOURCE INSTANT PROTEIN PO PWD PACKET
2.0000 | Freq: Two times a day (BID) | ORAL | Status: DC
  Administered 2015-11-27 – 2015-12-02 (×6): 12 g via ORAL
  Filled 2015-11-27 (×14): qty 12

## 2015-11-27 MED ORDER — LOSARTAN POTASSIUM 25 MG PO TABS
25.0000 mg | ORAL_TABLET | Freq: Every day | ORAL | Status: DC
  Administered 2015-11-27 – 2015-11-30 (×4): 25 mg via ORAL
  Filled 2015-11-27 (×5): qty 1

## 2015-11-27 MED ORDER — GUAIFENESIN 100 MG/5ML PO SOLN
10.0000 mL | ORAL | Status: DC | PRN
  Administered 2015-11-27: 200 mg via ORAL

## 2015-11-27 MED ORDER — MECLIZINE HCL 25 MG PO TABS
25.0000 mg | ORAL_TABLET | Freq: Every day | ORAL | Status: DC | PRN
  Filled 2015-11-27: qty 1

## 2015-11-27 MED ORDER — LINACLOTIDE 145 MCG PO CAPS
145.0000 ug | ORAL_CAPSULE | Freq: Every day | ORAL | Status: DC
  Administered 2015-11-27 – 2015-12-02 (×5): 145 ug via ORAL
  Filled 2015-11-27 (×9): qty 1

## 2015-11-27 MED ORDER — MUPIROCIN 2 % EX OINT
1.0000 "application " | TOPICAL_OINTMENT | Freq: Two times a day (BID) | CUTANEOUS | Status: AC
  Administered 2015-11-27 – 2015-12-02 (×9): 1 via NASAL
  Filled 2015-11-27: qty 22

## 2015-11-27 MED ORDER — SENNOSIDES-DOCUSATE SODIUM 8.6-50 MG PO TABS
1.0000 | ORAL_TABLET | Freq: Two times a day (BID) | ORAL | Status: DC
  Administered 2015-11-27 – 2015-12-03 (×10): 1 via ORAL
  Filled 2015-11-27 (×10): qty 1

## 2015-11-27 MED ORDER — MIRABEGRON ER 50 MG PO TB24
50.0000 mg | ORAL_TABLET | Freq: Every day | ORAL | Status: DC
  Administered 2015-11-27 – 2015-12-02 (×5): 50 mg via ORAL
  Filled 2015-11-27 (×7): qty 1

## 2015-11-27 MED ORDER — PREGABALIN 75 MG PO CAPS
75.0000 mg | ORAL_CAPSULE | Freq: Two times a day (BID) | ORAL | Status: DC
  Administered 2015-11-27 – 2015-12-03 (×11): 75 mg via ORAL
  Filled 2015-11-27 (×11): qty 1

## 2015-11-27 MED ORDER — AZITHROMYCIN 500 MG IV SOLR
500.0000 mg | Freq: Once | INTRAVENOUS | Status: AC
  Administered 2015-11-27: 500 mg via INTRAVENOUS
  Filled 2015-11-27: qty 500

## 2015-11-27 MED ORDER — DEXTROSE 5 % IV SOLN
500.0000 mg | INTRAVENOUS | Status: DC
  Administered 2015-11-28 – 2015-11-30 (×3): 500 mg via INTRAVENOUS
  Filled 2015-11-27 (×3): qty 500

## 2015-11-27 MED ORDER — FERROUS SULFATE 325 (65 FE) MG PO TABS
325.0000 mg | ORAL_TABLET | Freq: Two times a day (BID) | ORAL | Status: DC
  Administered 2015-11-27 – 2015-12-02 (×8): 325 mg via ORAL
  Filled 2015-11-27 (×9): qty 1

## 2015-11-27 MED ORDER — FUROSEMIDE 40 MG PO TABS
40.0000 mg | ORAL_TABLET | Freq: Every day | ORAL | Status: DC
  Administered 2015-11-27 – 2015-11-28 (×2): 40 mg via ORAL
  Filled 2015-11-27 (×2): qty 1

## 2015-11-27 MED ORDER — GUAIFENESIN 100 MG/5ML PO SOLN
10.0000 mg | ORAL | Status: DC | PRN
  Filled 2015-11-27: qty 10

## 2015-11-27 NOTE — Evaluation (Signed)
Clinical/Bedside Swallow Evaluation Patient Details  Name: [name] MRN: [id number] Date of Birth: 12-Feb-1924  Today's Date: 11/27/2015 Time: SLP Start Time (ACUTE ONLY): [id number] SLP Stop Time (ACUTE ONLY): 1409 SLP Time Calculation (min) (ACUTE ONLY): 15 min  Past Medical History:  Past Medical History  Diagnosis Date  . Carotid artery stenosis   . Essential hypertension   . Myocardial infarction ([place]) 1979  . Coronary artery disease     Known occluded proximal LAD with collaterals, moderate RCA disease - managed medically 2009  . COPD (chronic obstructive pulmonary disease) ([place])   . History of stroke   . Prostate cancer ([place])   . Arthritis   . Foot drop, right   . Neuropathy ([place])   . Anemia   . Urinary incontinence, male, stress   . GERD (gastroesophageal reflux disease)   . Thalassemia   . Wears dentures   . Deafness in left ear     Wears hearing aide in right ear  . Sleep apnea     CPAP  . Ischemic cardiomyopathy     LVEF 40-45%  . Hypercholesteremia   . Anemia   . Osteoarthritis   . Stroke ([place])   . Heart attack ([place])   . Vertigo   . Hereditary and idiopathic peripheral neuropathy 03/01/2015  . Abnormality of gait 03/01/2015   Past Surgical History:  Past Surgical History  Procedure Laterality Date  . Penile prosthesis implant    . Cholecystectomy    . Right carotid endarterectomy    . Back surgery    . Lumbar laminectomy  03/24/10    L3-4 and L4-5  . Ankle surgery      Bone infection  . Cardiac catheterization    . Appendectomy    . Joint replacement Left   . Radioactive seed implant  2009  . Pars plana vitrectomy  03/19/2012    Procedure: PARS PLANA VITRECTOMY WITH 25 GAUGE;  Surgeon: Hayden Pedro, MD;  Location: [facility];  Service: Ophthalmology;  Laterality: Right;  . Carotid endarterectomy Right     CEA - 15 yrs ago  . Spine surgery    . Eye surgery    . Cataract extraction w/ intraocular lens implant Left   . Endarterectomy Left 09/28/2014   Procedure: Left Carotid Endarterectomy;  Surgeon: Rosetta Posner, MD;  Location: [facility];  Service: Vascular;  Laterality: Left;  . Patch angioplasty Left 09/28/2014    Procedure: WITH DACRON PATCH ANGIOPLASTY;  Surgeon: Rosetta Posner, MD;  Location: [facility] OR;  Service: Vascular;  Laterality: Left;   HPI:  82 yo male with history of systolic HF, HTN, CKDIII, COPD, DM II, stroke GERD, deaf in left ear and peripheral neuropathy who was brought here from rehab due to dyspnea and hypoxia. Per chart recent admission for for acute respiratory failure due to COPD exacerbation / HCAP (11/09/15). CXR right lower lobe consolidation posteriorly, likely pneumonia. Pulmonary hemorrhage could also produce this appearance. No previous ST notes found in EPIC.   Assessment / Plan / Recommendation Clinical Impression  Pt with baseline cough present prior to po's consumed; daughter and pt denied correlation of increased coughing during or after meals. Decreased palpable laryngeal elevation and intermittent cough. Increased aspiration risk given history of COPD which SLP educated pt/daughter re: relationship. Compensatory strategies educated including rest breaks following 3-4 bites/sips, intermittent throat clear, upright posture, cup sips preferred, pills (one at a time) with thin and avoid mixing textures in oral cavity. ST will  follow to determine safety or need for instrumental assessment.        Aspiration Risk  Moderate aspiration risk    Diet Recommendation Regular;Thin liquid   Liquid Administration via: Cup;No straw Medication Administration: Whole meds with liquid Supervision: Patient able to self feed;Intermittent supervision to cue for compensatory strategies Compensations: Slow rate;Small sips/bites;Clear throat intermittently Postural Changes: Seated upright at 90 degrees;Remain upright for at least 30 minutes after po intake    Other  Recommendations Oral Care Recommendations: Oral care BID   Follow up  Recommendations   (TBD)    Frequency and Duration min 2x/week  2 weeks       Prognosis        Swallow Study   General HPI: 82 yo male with history of systolic HF, HTN, CKDIII, COPD, DM II, stroke GERD, deaf in left ear and peripheral neuropathy who was brought here from rehab due to dyspnea and hypoxia. Per chart recent admission for for acute respiratory failure due to COPD exacerbation / HCAP (11/09/15). CXR right lower lobe consolidation posteriorly, likely pneumonia. Pulmonary hemorrhage could also produce this appearance. No previous ST notes found in EPIC. Type of Study: Bedside Swallow Evaluation Previous Swallow Assessment:  (none found) Diet Prior to this Study: Regular;Thin liquids Temperature Spikes Noted: No Respiratory Status: Nasal cannula History of Recent Intubation: No Behavior/Cognition: Alert;Cooperative;Pleasant mood Oral Cavity Assessment: Within Functional Limits Oral Care Completed by SLP: No Oral Cavity - Dentition: Dentures, top (natural lower, missing some) Vision: Functional for self-feeding Self-Feeding Abilities: Able to feed self Patient Positioning: Upright in bed Baseline Vocal Quality: Normal Volitional Cough: Strong Volitional Swallow: Able to elicit    Oral/Motor/Sensory Function Overall Oral Motor/Sensory Function: Within functional limits   Ice Chips Ice chips: Not tested   Thin Liquid Thin Liquid: Impaired Presentation: Cup;Straw Oral Phase Impairments:  (none) Pharyngeal  Phase Impairments: Decreased hyoid-laryngeal movement;Cough - Delayed    Nectar Thick Nectar Thick Liquid: Not tested   Honey Thick Honey Thick Liquid: Not tested   Puree Puree: Impaired Oral Phase Impairments:  (none) Pharyngeal Phase Impairments: Decreased hyoid-laryngeal movement;Cough - Delayed   Solid   GO   Solid: Within functional limits    Functional Assessment Tool Used: skilled clinical judgement Functional Limitations: Swallowing Swallow Current  Status [id number]): At least 20 percent but less than 40 percent impaired, limited or restricted Swallow Goal Status [phone]): At least 1 percent but less than 20 percent impaired, limited or restricted   Houston Siren 11/27/2015,4:36 PM   Orbie Pyo Colvin Caroli.Ed [employer] [phone]

## 2015-11-27 NOTE — ED Notes (Signed)
Brought in by EMS from [facility] and Rehab with c/o cough.   Per EMS, facility staff reported that pt's O2 saturation has been low in the 70's despite neb treatments.  Pt has recently completed his course of antibiotics for pneumonia (completed 11/24/2015) but pt's cough has been persistent.  Pt's O2 sat was 95% on O2 at 2 LPM via [place] by EMS.

## 2015-11-27 NOTE — ED Notes (Signed)
Called to [facility] to give report but staff is in their huddle and receiving RN will call back when finished.

## 2015-11-27 NOTE — ED Notes (Signed)
Pt's blood cultures x 2 WERE DRAWN PRIOR TO THE ADMISSION OF THE ANTIBIOTICS even though the charting may not indicate this.

## 2015-11-27 NOTE — Consult Note (Signed)
Name: [name] MRN: [id number] DOB: 1924-04-07    ADMISSION DATE:  11/27/2015 CONSULTATION DATE:  11/27/2015  REFERRING MD :  Dreama Saa, [facility]  CHIEF COMPLAINT:  Persistent  RLL infiltrate   HISTORY OF PRESENT ILLNESS:  82 year old man with COPD was admitted last week of March with the right lower lobe pneumonia with nodular infiltrates on CT and Escherichia coli UTI. He was treated with antibiotic steroids and bronchodilators and discharged to short-term rehabilitation on nasal cannula. He completed his antibiotic course. He was also maintained on Lasix, Coreg and Cozaar for her chronic systolic CHF. Speech evaluation was obtained for dysphagia He was transferred from the rehabilitation facility due to dyspnea and hypoxia. He reports cough with inability to clear his sputum, denies fevers or chest pain Chest x-ray showed right lower lobe consolidation and we're consulted to comment on persistent right lower lobe infiltrate He is now on 5 L nasal cannula  He is a retired Dealer and worked with brakes, smoked until [place] 11/13/2013 > FEV1 1.46 (45%) ratio 52 I note 5 hospital/ED visits in the last 6 months PMH - COPD, CKD stage III, HTN, ischemic cardiomyopathy, systolic CHF last EF [id number]  SIGNIFICANT EVENTS hospital admission from  11/09/15-11/15/15 with acute respiratory failure from HCAP, COPD exacerbation,e.coli UTI 3/12- 3/21 HCAP, acute hypercarbic respiratory failure requiring brief mechanical ventilation  3/2 through 3/11 where he was treated for small bowel obstruction and ileus.   STUDIES:  3/29 CT chest-nodular infiltrates worst in the right middle and lower lobe likely represents acute infection 3/3 CT abdomen right lower lobe appears clear  3/14 echo decreased systolic function [id number], akinesis of the apex and septum    PAST MEDICAL HISTORY :   has a past medical history of Carotid artery stenosis; Essential hypertension; Myocardial infarction ([place]) (1979);  Coronary artery disease; COPD (chronic obstructive pulmonary disease) ([place]); History of stroke; Prostate cancer ([place]); Arthritis; Foot drop, right; Neuropathy ([place]); Anemia; Urinary incontinence, male, stress; GERD (gastroesophageal reflux disease); Thalassemia; Wears dentures; Deafness in left ear; Sleep apnea; Ischemic cardiomyopathy; Hypercholesteremia; Anemia; Osteoarthritis; Stroke [facility]); Heart attack ([place]); Vertigo; Hereditary and idiopathic peripheral neuropathy (03/01/2015); and Abnormality of gait (03/01/2015).  has past surgical history that includes Penile prosthesis implant; Cholecystectomy; Right carotid endarterectomy; Back surgery; Lumbar laminectomy (03/24/10); Ankle surgery; Cardiac catheterization; Appendectomy; Joint replacement (Left); Radioactive seed implant (2009); Pars plana vitrectomy (03/19/2012); Carotid endarterectomy (Right); Spine surgery; Eye surgery; Cataract extraction w/ intraocular lens implant (Left); Endarterectomy (Left, 09/28/2014); and Patch angioplasty (Left, 09/28/2014). Prior to Admission medications   Medication Sig Start Date End Date Taking? Authorizing Provider  acetaminophen (TYLENOL) 325 MG tablet Take 650 mg by mouth every 6 (six) hours as needed for mild pain.    Yes Historical Provider, MD  acidophilus (RISAQUAD) CAPS capsule Take 2 capsules by mouth daily. 11/15/15 12/15/15 Yes Silver Huguenin Elgergawy, MD  albuterol (PROVENTIL) (2.5 MG/3ML) 0.083% nebulizer solution Take 3 mLs (2.5 mg total) by nebulization every 6 (six) hours as needed for wheezing. 11/15/15  Yes Albertine Patricia, MD  aspirin EC 81 MG tablet Take 81 mg by mouth daily.   Yes Historical Provider, MD  carvedilol (COREG) 12.5 MG tablet TAKE 1 TABLET TWICE DAILY WITH A MEAL 06/08/15  Yes Dorothy Spark, MD  ferrous sulfate 325 (65 FE) MG tablet Take 1 tablet (325 mg total) by mouth 2 (two) times daily with a meal. 11/02/15  Yes Lavina Hamman, MD  furosemide (LASIX) 40 MG tablet Take 40 mg  by mouth.   Yes  Historical Provider, MD  Glycopyrrolate-Formoterol (BEVESPI AEROSPHERE) 9-4.8 MCG/ACT AERO Inhale 2 puffs into the lungs every 12 (twelve) hours. 07/19/15  Yes Tanda Rockers, MD  guaiFENesin (MUCINEX) 600 MG 12 hr tablet Take 1 tablet (600 mg total) by mouth 2 (two) times daily. Please take for 2 weeks. 11/15/15  Yes Albertine Patricia, MD  guaifenesin (ROBITUSSIN) 100 MG/5ML syrup Take 10 mg by mouth every 4 (four) hours as needed for cough.   Yes Historical Provider, MD  insulin aspart (NOVOLOG) 100 UNIT/ML injection Inject 0-9 Units into the skin 3 (three) times daily with meals. For CBGs 0-69 = 0 units, 70-120 = 0 units, 121-150 = 1 unit, 151-200 = 2 units, 201-250 = 3 units, 252-300 = 5 units, 301-350 = 7 units, 351-400 = 9 units   Yes Historical Provider, MD  insulin glargine (LANTUS) 100 UNIT/ML injection Inject 0.05 mLs (5 Units total) into the skin daily. Patient taking differently: Inject 5 Units into the skin at bedtime.  11/15/15  Yes Dawood S Elgergawy, MD  ipratropium-albuterol (DUONEB) 0.5-2.5 (3) MG/3ML SOLN Take 3 mLs by nebulization 3 (three) times daily. 11/15/15  Yes Albertine Patricia, MD  LINZESS 145 MCG CAPS capsule Take 1 capsule by mouth daily with breakfast. 06/29/15  Yes Historical Provider, MD  losartan (COZAAR) 25 MG tablet Take 1 tablet (25 mg total) by mouth daily. 01/14/15  Yes Marijean Heath, NP  magic mouthwash SOLN Take 5 mLs by mouth 4 (four) times daily. 11/15/15  Yes Albertine Patricia, MD  meclizine (ANTIVERT) 25 MG tablet Take 1 tablet (25 mg total) by mouth daily as needed for dizziness. 01/29/15  Yes Dorothy Spark, MD  mometasone-formoterol (DULERA) 200-5 MCG/ACT AERO Inhale 2 puffs into the lungs 2 (two) times daily. 10/01/15  Yes Tanda Rockers, MD  MYRBETRIQ 50 MG TB24 tablet Take 50 mg by mouth daily. 06/29/15  Yes Historical Provider, MD  NITROSTAT 0.4 MG SL tablet PLACE 1 TABLET (0.4 MG TOTAL) UNDER THE TONGUE EVERY 5 (FIVE) MINUTES AS NEEDED. FOR CHEST  PAIN 04/08/15  Yes Dorothy Spark, MD  omeprazole (PRILOSEC) 20 MG capsule Take 40 mg by mouth daily.   Yes Historical Provider, MD  polyethylene glycol (MIRALAX / GLYCOLAX) packet Take 17 g by mouth 2 (two) times daily. 11/02/15  Yes Lavina Hamman, MD  pregabalin (LYRICA) 75 MG capsule Take 75 mg by mouth 2 (two) times daily.    Yes Historical Provider, MD  Protein (PROCEL) POWD Take 2 scoop by mouth 2 (two) times daily.   Yes Historical Provider, MD  senna-docusate (SENOKOT-S) 8.6-50 MG tablet Take 1 tablet by mouth 2 (two) times daily. 11/02/15  Yes Lavina Hamman, MD  simvastatin (ZOCOR) 10 MG tablet Take 10 mg by mouth daily at 6 PM.  10/11/15  Yes Historical Provider, MD  predniSONE (DELTASONE) 10 MG tablet Please take 3 tablets for 3 days, then 2 tablets for 3 days, then 1 tablet for 3 days, then stop Patient not taking: Reported on 11/27/2015 11/15/15   Albertine Patricia, MD   Allergies  Allergen Reactions  . Clopidogrel Bisulfate Hives    REACTION: red blue, black blotches  . Dilaudid [Hydromorphone Hcl] Hives and Other (See Comments)    Hallucinations, little green men working on the building  . Penicillins Hives    Has patient had a PCN reaction causing immediate rash, facial/tongue/throat swelling, SOB or lightheadedness with hypotension: Yes Has  patient had a PCN reaction causing severe rash involving mucus membranes or skin necrosis: No Has patient had a PCN reaction that required hospitalization No Has patient had a PCN reaction occurring within the last 10 years: No Tolerates cephalosporins   . Sulfa Antibiotics Other (See Comments)    Nightmares, hallucinations  . Sulfonamide Derivatives Other (See Comments)    Nightmares, hallucinations  . Doxycycline Hyclate Rash    FAMILY HISTORY:  family history includes Cancer in his brother; Cancer (age of onset: 110) in his mother; Diabetes in his mother and sister; Heart attack in his mother; Heart attack (age of onset: 14) in his  brother; Heart disease in his brother, father, and mother; Hypertension in his father, mother, and sister; Stroke (age of onset: 80) in his father. SOCIAL HISTORY:  reports that he quit smoking about 38 years ago. His smoking use included Cigarettes. He has a 20 pack-year smoking history. He has never used smokeless tobacco. He reports that he does not drink alcohol or use illicit drugs.  REVIEW OF SYSTEMS:   Constitutional: Negative for fever, chills, weight loss, malaise/fatigue and diaphoresis.  HENT:Positive for hearing loss, negative for ear pain, nosebleeds, congestion, sore throat, neck pain, tinnitus and ear discharge.   Eyes: Negative for blurred vision, double vision, photophobia, pain, discharge and redness.  Respiratory: Negative for hemoptysis,  and stridor.   Cardiovascular: Negative for chest pain, palpitations, orthopnea, claudication, leg swelling and PND.  Gastrointestinal: Negative for heartburn, nausea, vomiting, abdominal pain, diarrhea, constipation, blood in stool and melena.  Genitourinary: Negative for dysuria, urgency, frequency, hematuria and flank pain.  Musculoskeletal: Negative for myalgias, back pain, joint pain and falls.  Skin: Negative for itching and rash.  Neurological: Negative for dizziness, tingling, tremors, sensory change, speech change, focal weakness, seizures, loss of consciousness, weakness and headaches.  Endo/Heme/Allergies: Negative for environmental allergies and polydipsia. Does not bruise/bleed easily.  SUBJECTIVE:   VITAL SIGNS: Temp:  [97.5 F (36.4 C)-98.5 F (36.9 C)] 97.8 F (36.6 C) (04/15 1040) Pulse Rate:  [78-85] 79 (04/15 1040) Resp:  [19-28] 20 (04/15 1040) BP: (146-165)/(65-81) 146/81 mmHg (04/15 1040) SpO2:  [88 %-96 %] 96 % (04/15 1040) Weight:  [158 lb 4.8 oz (71.804 kg)] 158 lb 4.8 oz (71.804 kg) (04/15 1040)  PHYSICAL EXAMINATION: Gen. Pleasant, well-nourished, in no distress, normal affect ENT - no lesions, no post  nasal drip, stream very hard of hearing Neck: No JVD, no thyromegaly, no carotid bruits Lungs: no use of accessory muscles, no dullness to percussion, right lower lobe coarse rales, no rhonchi  Cardiovascular: Rhythm regular, heart sounds  normal, no murmurs or gallops, no peripheral edema Abdomen: soft and non-tender, no hepatosplenomegaly, BS normal. Musculoskeletal: No deformities, no cyanosis or clubbing Neuro:  alert, non focal    Recent Labs Lab 11/27/15 0648  NA 141  K 3.9  CL 102  CO2 30  BUN 22*  CREATININE 1.04  GLUCOSE 158*    Recent Labs Lab 11/27/15 0648  HGB 8.9*  HCT 28.6*  WBC 6.9  PLT 170   Dg Chest 2 View  11/27/2015  CLINICAL DATA:  Dyspnea, cough and congestion. Low oxygen saturations despite nebulizer treatments. EXAM: CHEST  2 VIEW COMPARISON:  11/10/2015 FINDINGS: There is consolidation in the right lower lobe posteriorly, consistent with pneumonia but aspiration or hemorrhage could also produce this appearance. The left lung is clear except for unchanged linear basilar scarring or atelectasis. The pulmonary vasculature is normal. There are no pleural effusions. Hilar  and mediastinal contours are unremarkable and unchanged. IMPRESSION: Right lower lobe consolidation posteriorly, likely pneumonia. Pulmonary hemorrhage could also produce this appearance. Electronically Signed   By: Andreas Newport M.D.   On: 11/27/2015 06:44    ASSESSMENT / PLAN:  Acute hypoxic respiratory failure Gold stage III/ D COPD with recurrent admits in the last few months Right lower lobe HCAP- although this infiltrate is persistent when compared to chest x-ray from late March this is new compared to prior-hence I'm not worried about malignancy.  Recurrent aspiration is a concern-I could not retrieve the results of his prior swallowing evaluation He does not appear to be in overt failure Recommend admissions on a concern  I agree with empiric broad-spectrum antibiotics for  HCAP / nosocomial aspiration Repeat swallow assessment Can taper steroids rapidly as his bronchospasm resolves Chest x-ray can be followed as outpatient to resolution Given his overall failure to thrive, may consider palliative care consult   Kara Mead MD. FCCP. [facility] Pager [phone] If no response call [phone]   11/27/2015   11/27/2015, 11:55 AM

## 2015-11-27 NOTE — Progress Notes (Signed)
Pharmacy Antibiotic Note  [name] is a 82 y.o. male admitted on 11/27/2015 with pneumonia.   He was initially started on Rocephin & Zithromax for CAP in ED, however given hospitalization & rehab stay in past month coverage is being broadened for possible health-care acquired pathogens.   PCN allergy noted (hives).  Patient has tolerated cephalosporins previously.  Dr Dreama Saa was contacted and Zosyn order was changed to Cefepime.   Pharmacy has been consulted for Vancomycin & Cefepime dosing.  11/27/2015:   Patient is afebrile   WBC, LA wnl  Scr at patient's baseline.  Estimated CrCl ~ 66ml/min.  CXR 4/15 +RLL PNA  Plan: Vancomycin 1250 IV every 24 hours.  Goal trough 15-20 mcg/mL. Cefepime 1gm IV q8h -->start in am since Rocephin already given this morning. Continue Zithromax Check Vancomycin trough at steady state Monitor renal function and cx data      Temp (24hrs), Avg:98 F (36.7 C), Min:97.5 F (36.4 C), Max:98.5 F (36.9 C)   Recent Labs Lab 11/27/15 0648 11/27/15 0704  WBC 6.9  --   CREATININE 1.04  --   LATICACIDVEN  --  1.17    Estimated Creatinine Clearance: 50.7 mL/min (by C-G formula based on Cr of 1.04).    Allergies  Allergen Reactions  . Clopidogrel Bisulfate Hives    REACTION: red blue, black blotches  . Dilaudid [Hydromorphone Hcl] Hives    Hallucinations, little green men working on the building  . Penicillins Hives    Has patient had a PCN reaction causing immediate rash, facial/tongue/throat swelling, SOB or lightheadedness with hypotension: Yes Has patient had a PCN reaction causing severe rash involving mucus membranes or skin necrosis: No Has patient had a PCN reaction that required hospitalization No Has patient had a PCN reaction occurring within the last 10 years: No Tolerates cephalosporins   . Sulfa Antibiotics Other (See Comments)    Nightmares, hallucinations  . Sulfonamide Derivatives Other (See Comments)    Nightmares,  hallucinations  . Doxycycline Hyclate Rash    Antimicrobials this admission: Rocephin 4/15 x1 dose in ED Zithromax 4/15 >>  Vanc 4/15 >>  Cefepime 4/16>>  Dose adjustments this admission:  Microbiology results: 4/15 BCx: sent 4/15 Sputum: ordered   Thank you for allowing pharmacy to be a part of this patient's care.  Biagio Borg 11/27/2015 9:19 AM

## 2015-11-27 NOTE — ED Notes (Signed)
Patient transported to X-ray 

## 2015-11-27 NOTE — ED Notes (Signed)
Bed: [id number] Expected date:  Expected time:  Means of arrival:  Comments: EMS 82yo M  Diff breathing / recent PNA

## 2015-11-27 NOTE — ED Provider Notes (Signed)
CSN: [id number]     Arrival date & time 11/27/15  [id number] History   First MD Initiated Contact with Patient 11/27/15 [phone]     Chief Complaint  Patient presents with  . Cough     HPI   82 year old male presents today with complaints of shortness of breath and cough. Patient was most recently discharged from the hospital on 11/15/2015 4 hospital acquired pneumonia growing Escherichia coli. Patient has had 4 hospital admissions in the last 6 months. Patient was admitted the hospital for near syncope and was found to have elevated creatinine. Patient returned several days later with likely ileus and was discharged on 10/23/2015. Patient returned the following day with shortness of breath and cough and was admitted for pneumonia. Once again he was discharged and readmitted for the same. Patient was discharged to physical therapy where he had improvements in his shortness of breath oxygen saturation. Patients who provides most of the details and reports that nursing staff called EMS as patient had complaints of shortness of breath with oxygen in the 70s. Patient is normally on 2 L at home, the time my evaluation he was on 3 L and saturating at 91. Patient presently denies any shortness of breath, but reports the cough has worsened with increasing audible sounds. Patient notes he recently finished a course of antibiotics( Cipro) for Escherichia coli H. Family denies any fevers at home, notes sacral ulcers that are healing well and being taken care of by wound care at physical therapy.   Past Medical History  Diagnosis Date  . Carotid artery stenosis   . Essential hypertension   . Myocardial infarction ([place]) 1979  . Coronary artery disease     Known occluded proximal LAD with collaterals, moderate RCA disease - managed medically 2009  . COPD (chronic obstructive pulmonary disease) ([place])   . History of stroke   . Prostate cancer ([place])   . Arthritis   . Foot drop, right   . Neuropathy ([place])   . Anemia    . Urinary incontinence, male, stress   . GERD (gastroesophageal reflux disease)   . Thalassemia   . Wears dentures   . Deafness in left ear     Wears hearing aide in right ear  . Sleep apnea     CPAP  . Ischemic cardiomyopathy     LVEF 40-45%  . Hypercholesteremia   . Anemia   . Osteoarthritis   . Stroke ([place])   . Heart attack ([place])   . Vertigo   . Hereditary and idiopathic peripheral neuropathy 03/01/2015  . Abnormality of gait 03/01/2015   Past Surgical History  Procedure Laterality Date  . Penile prosthesis implant    . Cholecystectomy    . Right carotid endarterectomy    . Back surgery    . Lumbar laminectomy  03/24/10    L3-4 and L4-5  . Ankle surgery      Bone infection  . Cardiac catheterization    . Appendectomy    . Joint replacement Left   . Radioactive seed implant  2009  . Pars plana vitrectomy  03/19/2012    Procedure: PARS PLANA VITRECTOMY WITH 25 GAUGE;  Surgeon: Hayden Pedro, MD;  Location: [facility];  Service: Ophthalmology;  Laterality: Right;  . Carotid endarterectomy Right     CEA - 15 yrs ago  . Spine surgery    . Eye surgery    . Cataract extraction w/ intraocular lens implant Left   . Endarterectomy Left 09/28/2014  Procedure: Left Carotid Endarterectomy;  Surgeon: Rosetta Posner, MD;  Location: [facility];  Service: Vascular;  Laterality: Left;  . Patch angioplasty Left 09/28/2014    Procedure: WITH DACRON PATCH ANGIOPLASTY;  Surgeon: Rosetta Posner, MD;  Location: [facility];  Service: Vascular;  Laterality: Left;   Family History  Problem Relation Age of Onset  . Thalassemia      Family history  . Cancer Mother 43    Bone cancer  . Diabetes Mother   . Heart disease Mother   . Heart attack Mother   . Hypertension Mother   . Stroke Father 68  . Heart disease Father   . Hypertension Father   . Hypertension Sister   . Diabetes Sister   . Heart disease Brother   . Heart attack Brother 54  . Cancer Brother    Social History  Substance Use Topics  .  Smoking status: Former Smoker -- 1.00 packs/day for 20 years    Types: Cigarettes    Quit date: 08/14/1977  . Smokeless tobacco: Never Used  . Alcohol Use: No     Comment: occasional    Review of Systems  All other systems reviewed and are negative.   Allergies  Clopidogrel bisulfate; Dilaudid; Penicillins; Sulfa antibiotics; Sulfonamide derivatives; and Doxycycline hyclate  Home Medications   Prior to Admission medications   Medication Sig Start Date End Date Taking? Authorizing Provider  acetaminophen (TYLENOL) 325 MG tablet Take 650 mg by mouth every 6 (six) hours as needed for mild pain.     Historical Provider, MD  acidophilus (RISAQUAD) CAPS capsule Take 2 capsules by mouth daily. 11/15/15 12/15/15  Silver Huguenin Elgergawy, MD  albuterol (PROVENTIL) (2.5 MG/3ML) 0.083% nebulizer solution Take 3 mLs (2.5 mg total) by nebulization every 6 (six) hours as needed for wheezing. 11/15/15   Silver Huguenin Elgergawy, MD  aspirin EC 81 MG tablet Take 81 mg by mouth daily.    Historical Provider, MD  carvedilol (COREG) 12.5 MG tablet TAKE 1 TABLET TWICE DAILY WITH A MEAL 06/08/15   Dorothy Spark, MD  ferrous sulfate 325 (65 FE) MG tablet Take 1 tablet (325 mg total) by mouth 2 (two) times daily with a meal. 11/02/15   Lavina Hamman, MD  furosemide (LASIX) 40 MG tablet Take 40 mg by mouth.    Historical Provider, MD  Glycopyrrolate-Formoterol (BEVESPI AEROSPHERE) 9-4.8 MCG/ACT AERO Inhale 2 puffs into the lungs every 12 (twelve) hours. 07/19/15   Tanda Rockers, MD  guaiFENesin (MUCINEX) 600 MG 12 hr tablet Take 1 tablet (600 mg total) by mouth 2 (two) times daily. Please take for 2 weeks. 11/15/15   Silver Huguenin Elgergawy, MD  guaifenesin (ROBITUSSIN) 100 MG/5ML syrup Take 10 mg by mouth every 4 (four) hours as needed for cough.    Historical Provider, MD  insulin aspart (NOVOLOG) 100 UNIT/ML injection Inject 0-9 Units into the skin. For CBGs 0-69 = 0 units, 70-120 = 0 units, 121-150 = 1 unit, 151-200 = 2 units,  201-250 = 3 units, 252-300 = 5 units, 301-350 = 7 units, 351-400 = 9 units    Historical Provider, MD  insulin glargine (LANTUS) 100 UNIT/ML injection Inject 0.05 mLs (5 Units total) into the skin daily. 11/15/15   Silver Huguenin Elgergawy, MD  ipratropium-albuterol (DUONEB) 0.5-2.5 (3) MG/3ML SOLN Take 3 mLs by nebulization 3 (three) times daily. 11/15/15   Silver Huguenin Elgergawy, MD  LINZESS 145 MCG CAPS capsule Take 1 capsule by mouth daily with  breakfast. 06/29/15   Historical Provider, MD  losartan (COZAAR) 25 MG tablet Take 1 tablet (25 mg total) by mouth daily. 01/14/15   Marijean Heath, NP  magic mouthwash SOLN Take 5 mLs by mouth 4 (four) times daily. 11/15/15   Silver Huguenin Elgergawy, MD  meclizine (ANTIVERT) 25 MG tablet Take 1 tablet (25 mg total) by mouth daily as needed for dizziness. 01/29/15   Dorothy Spark, MD  mometasone-formoterol (DULERA) 200-5 MCG/ACT AERO Inhale 2 puffs into the lungs 2 (two) times daily. 10/01/15   Tanda Rockers, MD  MYRBETRIQ 50 MG TB24 tablet Take 50 mg by mouth daily. 06/29/15   Historical Provider, MD  NITROSTAT 0.4 MG SL tablet PLACE 1 TABLET (0.4 MG TOTAL) UNDER THE TONGUE EVERY 5 (FIVE) MINUTES AS NEEDED. FOR CHEST PAIN 04/08/15   Dorothy Spark, MD  omeprazole (PRILOSEC) 20 MG capsule Take 40 mg by mouth daily.    Historical Provider, MD  polyethylene glycol (MIRALAX / GLYCOLAX) packet Take 17 g by mouth 2 (two) times daily. 11/02/15   Lavina Hamman, MD  predniSONE (DELTASONE) 10 MG tablet Please take 3 tablets for 3 days, then 2 tablets for 3 days, then 1 tablet for 3 days, then stop 11/15/15   Albertine Patricia, MD  pregabalin (LYRICA) 75 MG capsule Take 75 mg by mouth 2 (two) times daily.     Historical Provider, MD  Protein (PROCEL) POWD Take 2 scoop by mouth 2 (two) times daily.    Historical Provider, MD  senna-docusate (SENOKOT-S) 8.6-50 MG tablet Take 1 tablet by mouth 2 (two) times daily. 11/02/15   Lavina Hamman, MD  simvastatin (ZOCOR) 10 MG tablet Take  10 mg by mouth daily at 6 PM.  10/11/15   Historical Provider, MD   BP 165/65 mmHg  Pulse 85  Temp(Src) 98.5 F (36.9 C) (Rectal)  Resp 28  SpO2 88%   Physical Exam  Constitutional: He is oriented to person, place, and time. He appears well-developed and well-nourished.  HENT:  Head: Normocephalic and atraumatic.  Eyes: Conjunctivae are normal. Pupils are equal, round, and reactive to light. Right eye exhibits no discharge. Left eye exhibits no discharge. No scleral icterus.  Neck: Normal range of motion. No JVD present. No tracheal deviation present.  Cardiovascular: Normal rate and regular rhythm.   Pulmonary/Chest: Effort normal. No stridor. No respiratory distress. He has no wheezes. He has rales. He exhibits no tenderness.  Musculoskeletal: He exhibits edema. He exhibits no tenderness.  Neurological: He is alert and oriented to person, place, and time. Coordination normal.  Skin: Skin is warm and dry. No rash noted. No erythema. No pallor.  Chronic skin breakdown to sacral area and gluteus. Wounds appear to be healing well without significant signs of infection   Psychiatric: He has a normal mood and affect. His behavior is normal. Judgment and thought content normal.  Nursing note and vitals reviewed.   ED Course  Procedures (including critical care time) Labs Review Labs Reviewed  CBC WITH DIFFERENTIAL/PLATELET - Abnormal; Notable for the following:    Hemoglobin 8.9 (*)    HCT 28.6 (*)    MCV 62.7 (*)    MCH 19.5 (*)    RDW 18.2 (*)    Lymphs Abs 0.6 (*)    All other components within normal limits  BASIC METABOLIC PANEL - Abnormal; Notable for the following:    Glucose, Bld 158 (*)    BUN 22 (*)  Calcium 8.7 (*)    All other components within normal limits  CULTURE, BLOOD (ROUTINE X 2)  CULTURE, BLOOD (ROUTINE X 2)  CULTURE, EXPECTORATED SPUTUM-ASSESSMENT  GRAM STAIN  INFLUENZA VIRUS AG, A+B (DFA)  HIV ANTIBODY (ROUTINE TESTING)  STREP PNEUMONIAE URINARY  ANTIGEN  LEGIONELLA PNEUMOPHILA SEROGP 1 UR AG  I-STAT CG4 LACTIC ACID, ED    Imaging Review Dg Chest 2 View  11/27/2015  CLINICAL DATA:  Dyspnea, cough and congestion. Low oxygen saturations despite nebulizer treatments. EXAM: CHEST  2 VIEW COMPARISON:  11/10/2015 FINDINGS: There is consolidation in the right lower lobe posteriorly, consistent with pneumonia but aspiration or hemorrhage could also produce this appearance. The left lung is clear except for unchanged linear basilar scarring or atelectasis. The pulmonary vasculature is normal. There are no pleural effusions. Hilar and mediastinal contours are unremarkable and unchanged. IMPRESSION: Right lower lobe consolidation posteriorly, likely pneumonia. Pulmonary hemorrhage could also produce this appearance. Electronically Signed   By: Andreas Newport M.D.   On: 11/27/2015 06:44   I have personally reviewed and evaluated these images and lab results as part of my medical decision-making.   EKG Interpretation None      MDM   Final diagnoses:  HCAP (healthcare-associated pneumonia)  SOB (shortness of breath)    Labs: blood culture, lactic acid, CBC, BMP,   Imaging: DG chest- Right lower lobe pneumonia  Consults:Hospitalist service  Therapeutics: Ceftriaxone, azithromycin  Discharge Meds:   Assessment/Plan: 89 YOM presents today with hospital-acquired pneumonia. Patient has numerous recent hospitalizations, was getting better at physical therapy but had worsening symptoms, hypoxia and shortness of breath. Patient's oxygen low 90s upper 80s here in the ED on 2 L. Patient is afebrile has no white count but has clinical signs of pneumonia. Patient will be admitted to the hospital service for further evaluation and management. On Anaprox here, no signs of sepsis or respiratory distress.    Okey Regal, PA-C 11/27/15 [id number]  Milton Ferguson, MD 11/28/15 [phone]

## 2015-11-27 NOTE — ED Notes (Signed)
O2 bumped up to 5L/[place] for sats of 87-89% on 4L/[place].  Humidity placed to O2 for comfort.   Pt's sats up to 91-92% on 5L/[place] after settling down from being cleaned up and turned for about 5 minutes.

## 2015-11-27 NOTE — Progress Notes (Signed)
PHARMACY NOTE -  Rocephin/Zithromax  Pharmacy has been assisting with dosing of ceftriaxone and azithromycin for CAP. Neither drug requires renal adjustment  Will sign off at this time.  Please reconsult if a change in clinical status warrants re-evaluation of dosage.  Reuel Boom, PharmD, BCPS Pager: [phone] 11/27/2015, 8:28 AM

## 2015-11-27 NOTE — ED Notes (Signed)
Pt rolled onto his RT side for comfort d/t pressure ulcers that he arrived with.

## 2015-11-27 NOTE — ED Notes (Signed)
Admitting RN will be ordering an air mattress for the patient due to his pressure ulcers.  Will need to hold off on bringing patient to floor for approx 3o minutes.

## 2015-11-27 NOTE — H&P (Signed)
History and Physical  [name] [id number] DOB: Jul 01, 1924 DOA: 11/27/2015  PCP:   Melinda Crutch, MD   Chief Complaint:  Dyspnea Hypoxia   History of Present Illness:  Patient is a 82 yo male with history of systolic HF, HTN, CKDIII, COPD, DM II, and peripheral neuropathy who was brought here from rehab due to dyspnea and hypoxia. He was recently admitted 3/28-4/3 for acute respiratory failure due to COPD exacerbation / HCAP and discharged to rehab on oxygen via [place] 2L. Patient is no mobile recently due to worsening peripheral neuropathy. He desated while in bed down to 70s on 2L of oxygen. He has been having rattling voice from his chest, cough with inability to clear sputum, and dyspnea. He also has a sore throat.   Review of Systems:  CONSTITUTIONAL:     No night sweats.  +fatigue.  No fever. No chills. Eyes:                            No visual changes.  No eye pain.  No eye discharge.   ENT:                              No epistaxis.  No sinus pain.  No sore throat.   No congestion. RESPIRATORY:           +cough.  +wheeze.  No hemoptysis.  +dyspnea CARDIOVASCULAR   :  No chest pains.  No palpitations. GASTROINTESTINAL:  No abdominal pain.  No nausea. No vomiting.  No diarrhea. No                                         constipation.  No hematemesis.  No hematochezia.  No melena. GENITOURINARY:      No urgency.  No frequency.  No dysuria.  No hematuria.  No                                                obstructive symptoms.  No discharge.  No pain.   MUSCULOSKELETAL:  No musculoskeletal pain.  No joint swelling.  No arthritis. NEUROLOGICAL:        No confusion.  +weakness. No headache. No seizure. PSYCHIATRIC:             No depression. No anxiety. No suicidal ideation. SKIN:                             No rashes.  No lesions.  +wounds. ENDOCRINE:                No weight loss.  No polydipsia.  No polyuria.  No polyphagia. HEMATOLOGIC:           No purpura.  No petechiae.   No bleeding.  ALLERGIC                 : No pruritus.  No angioedema Other:  Past Medical and Surgical History:   Past Medical History  Diagnosis Date  . Carotid artery stenosis   . Essential hypertension   . Myocardial infarction ([place]) 1979  . Coronary artery  disease     Known occluded proximal LAD with collaterals, moderate RCA disease - managed medically 2009  . COPD (chronic obstructive pulmonary disease) ([place])   . History of stroke   . Prostate cancer ([place])   . Arthritis   . Foot drop, right   . Neuropathy ([place])   . Anemia   . Urinary incontinence, male, stress   . GERD (gastroesophageal reflux disease)   . Thalassemia   . Wears dentures   . Deafness in left ear     Wears hearing aide in right ear  . Sleep apnea     CPAP  . Ischemic cardiomyopathy     LVEF 40-45%  . Hypercholesteremia   . Anemia   . Osteoarthritis   . Stroke ([place])   . Heart attack ([place])   . Vertigo   . Hereditary and idiopathic peripheral neuropathy 03/01/2015  . Abnormality of gait 03/01/2015   Past Surgical History  Procedure Laterality Date  . Penile prosthesis implant    . Cholecystectomy    . Right carotid endarterectomy    . Back surgery    . Lumbar laminectomy  03/24/10    L3-4 and L4-5  . Ankle surgery      Bone infection  . Cardiac catheterization    . Appendectomy    . Joint replacement Left   . Radioactive seed implant  2009  . Pars plana vitrectomy  03/19/2012    Procedure: PARS PLANA VITRECTOMY WITH 25 GAUGE;  Surgeon: Hayden Pedro, MD;  Location: [facility];  Service: Ophthalmology;  Laterality: Right;  . Carotid endarterectomy Right     CEA - 15 yrs ago  . Spine surgery    . Eye surgery    . Cataract extraction w/ intraocular lens implant Left   . Endarterectomy Left 09/28/2014    Procedure: Left Carotid Endarterectomy;  Surgeon: Rosetta Posner, MD;  Location: [facility];  Service: Vascular;  Laterality: Left;  . Patch angioplasty Left 09/28/2014    Procedure: WITH DACRON PATCH  ANGIOPLASTY;  Surgeon: Rosetta Posner, MD;  Location: [facility];  Service: Vascular;  Laterality: Left;    Social History:   reports that he quit smoking about 38 years ago. His smoking use included Cigarettes. He has a 20 pack-year smoking history. He has never used smokeless tobacco. He reports that he does not drink alcohol or use illicit drugs.    Allergies  Allergen Reactions  . Clopidogrel Bisulfate Hives    REACTION: red blue, black blotches  . Dilaudid [Hydromorphone Hcl] Hives    Hallucinations, little green men working on the building  . Penicillins Hives    Has patient had a PCN reaction causing immediate rash, facial/tongue/throat swelling, SOB or lightheadedness with hypotension: Yes Has patient had a PCN reaction causing severe rash involving mucus membranes or skin necrosis: No Has patient had a PCN reaction that required hospitalization No Has patient had a PCN reaction occurring within the last 10 years: No Tolerates cephalosporins   . Sulfa Antibiotics Other (See Comments)    Nightmares, hallucinations  . Sulfonamide Derivatives Other (See Comments)    Nightmares, hallucinations  . Doxycycline Hyclate Rash    Family History  Problem Relation Age of Onset  . Thalassemia      Family history  . Cancer Mother 24    Bone cancer  . Diabetes Mother   . Heart disease Mother   . Heart attack Mother   . Hypertension Mother   . Stroke  Father 52  . Heart disease Father   . Hypertension Father   . Hypertension Sister   . Diabetes Sister   . Heart disease Brother   . Heart attack Brother 14  . Cancer Brother       Prior to Admission medications   Medication Sig Start Date End Date Taking? Authorizing Provider  acetaminophen (TYLENOL) 325 MG tablet Take 650 mg by mouth every 6 (six) hours as needed for mild pain.     Historical Provider, MD  acidophilus (RISAQUAD) CAPS capsule Take 2 capsules by mouth daily. 11/15/15 12/15/15  Silver Huguenin Elgergawy, MD  albuterol  (PROVENTIL) (2.5 MG/3ML) 0.083% nebulizer solution Take 3 mLs (2.5 mg total) by nebulization every 6 (six) hours as needed for wheezing. 11/15/15   Silver Huguenin Elgergawy, MD  aspirin EC 81 MG tablet Take 81 mg by mouth daily.    Historical Provider, MD  carvedilol (COREG) 12.5 MG tablet TAKE 1 TABLET TWICE DAILY WITH A MEAL 06/08/15   Dorothy Spark, MD  ferrous sulfate 325 (65 FE) MG tablet Take 1 tablet (325 mg total) by mouth 2 (two) times daily with a meal. 11/02/15   Lavina Hamman, MD  furosemide (LASIX) 40 MG tablet Take 40 mg by mouth.    Historical Provider, MD  Glycopyrrolate-Formoterol (BEVESPI AEROSPHERE) 9-4.8 MCG/ACT AERO Inhale 2 puffs into the lungs every 12 (twelve) hours. 07/19/15   Tanda Rockers, MD  guaiFENesin (MUCINEX) 600 MG 12 hr tablet Take 1 tablet (600 mg total) by mouth 2 (two) times daily. Please take for 2 weeks. 11/15/15   Silver Huguenin Elgergawy, MD  guaifenesin (ROBITUSSIN) 100 MG/5ML syrup Take 10 mg by mouth every 4 (four) hours as needed for cough.    Historical Provider, MD  insulin aspart (NOVOLOG) 100 UNIT/ML injection Inject 0-9 Units into the skin. For CBGs 0-69 = 0 units, 70-120 = 0 units, 121-150 = 1 unit, 151-200 = 2 units, 201-250 = 3 units, 252-300 = 5 units, 301-350 = 7 units, 351-400 = 9 units    Historical Provider, MD  insulin glargine (LANTUS) 100 UNIT/ML injection Inject 0.05 mLs (5 Units total) into the skin daily. 11/15/15   Silver Huguenin Elgergawy, MD  ipratropium-albuterol (DUONEB) 0.5-2.5 (3) MG/3ML SOLN Take 3 mLs by nebulization 3 (three) times daily. 11/15/15   Albertine Patricia, MD  LINZESS 145 MCG CAPS capsule Take 1 capsule by mouth daily with breakfast. 06/29/15   Historical Provider, MD  losartan (COZAAR) 25 MG tablet Take 1 tablet (25 mg total) by mouth daily. 01/14/15   Marijean Heath, NP  magic mouthwash SOLN Take 5 mLs by mouth 4 (four) times daily. 11/15/15   Silver Huguenin Elgergawy, MD  meclizine (ANTIVERT) 25 MG tablet Take 1 tablet (25 mg total) by  mouth daily as needed for dizziness. 01/29/15   Dorothy Spark, MD  mometasone-formoterol (DULERA) 200-5 MCG/ACT AERO Inhale 2 puffs into the lungs 2 (two) times daily. 10/01/15   Tanda Rockers, MD  MYRBETRIQ 50 MG TB24 tablet Take 50 mg by mouth daily. 06/29/15   Historical Provider, MD  NITROSTAT 0.4 MG SL tablet PLACE 1 TABLET (0.4 MG TOTAL) UNDER THE TONGUE EVERY 5 (FIVE) MINUTES AS NEEDED. FOR CHEST PAIN 04/08/15   Dorothy Spark, MD  omeprazole (PRILOSEC) 20 MG capsule Take 40 mg by mouth daily.    Historical Provider, MD  polyethylene glycol (MIRALAX / GLYCOLAX) packet Take 17 g by mouth 2 (two) times daily. 11/02/15  Lavina Hamman, MD  predniSONE (DELTASONE) 10 MG tablet Please take 3 tablets for 3 days, then 2 tablets for 3 days, then 1 tablet for 3 days, then stop 11/15/15   Albertine Patricia, MD  pregabalin (LYRICA) 75 MG capsule Take 75 mg by mouth 2 (two) times daily.     Historical Provider, MD  Protein (PROCEL) POWD Take 2 scoop by mouth 2 (two) times daily.    Historical Provider, MD  senna-docusate (SENOKOT-S) 8.6-50 MG tablet Take 1 tablet by mouth 2 (two) times daily. 11/02/15   Lavina Hamman, MD  simvastatin (ZOCOR) 10 MG tablet Take 10 mg by mouth daily at 6 PM.  10/11/15   Historical Provider, MD    Physical Exam: BP 165/65 mmHg  Pulse 85  Temp(Src) 98.5 F (36.9 C) (Rectal)  Resp 28  SpO2 88%  GENERAL :   Alert and cooperative, and appears to be in mild acute distress. HEAD:           normocephalic. EYES:            PERRL, EOMI.  vision is grossly intact. EARS:           hearing grossly intact. NOSE:           No nasal discharge. THROAT:     Oral cavity and pharynx normal.   NECK:          supple, non-tender.  CARDIAC:    Normal S1 and S2.No murmurs.  Vascular:     Mild peripheral edema.Marland Kitchen LUNGS:      Bilateral crackles and wheezing. ABDOMEN: Positive bowel sounds. Soft, nondistended, nontender. No guarding or rebound.      MSK:           No joint erythema or  tenderness. Normal muscular development. EXT           : No significant deformity or joint abnormality. Neuro        : Alert, oriented to person, place, and time.                      CN II-XII intact.                      Strength and sensation symmetric in upper ext. No sensation to touch and strength 1/5 below knee in lower ext.                      SKIN:           Stage II decub ulcer PSYCH:       No hallucination. Patient is not suicidal.          Labs on Admission:  Reviewed.   Radiological Exams on Admission: Dg Chest 2 View  11/27/2015  CLINICAL DATA:  Dyspnea, cough and congestion. Low oxygen saturations despite nebulizer treatments. EXAM: CHEST  2 VIEW COMPARISON:  11/10/2015 FINDINGS: There is consolidation in the right lower lobe posteriorly, consistent with pneumonia but aspiration or hemorrhage could also produce this appearance. The left lung is clear except for unchanged linear basilar scarring or atelectasis. The pulmonary vasculature is normal. There are no pleural effusions. Hilar and mediastinal contours are unremarkable and unchanged. IMPRESSION: Right lower lobe consolidation posteriorly, likely pneumonia. Pulmonary hemorrhage could also produce this appearance. Electronically Signed   By: Andreas Newport M.D.   On: 11/27/2015 06:44     Assessment/Plan  Hypoxic respiratory failure: Likely due to  pneumonia w/wo COPD exacerbation  Will start on tx for HCAP : vanc/cefepime and azithro for atypicals Will check for influenza ag Persistent opacity in RLL, recent CT with no mass or deformity that would explain persistent pneumonia Could be new aspiration pneumonia vs. Poor clearing Will do chest therapy , incentive spirometry, consult to pulm  Will treat for COPD exacerbation : DuoNeb Q4H, prednisone 40 mg daily, continue Dulera Keep sat > 92  Systolic hF: Ech in March 2017 with EF of 40-45% No vascular congestin in CXR Continue lasix, BB and ARB  Anemia  : Microcytic : iron deficiency adn chronic disease At baseline Colonoscopy in 2005 was with benign polyps Continue iron supplements   HTN: continue home meds  DMII: Continue lantus 5 u QHS and keep on SS low correction  Stage II pressure ulcer: Wound care    DVT prophylaxis: [place] enoxaparin  GI prophylaxis:PPI Consultants: Pulm Code Status: Full Family Communication: daughter at bedside Disposition Plan: Floor     Gennaro Africa M.D [facility]

## 2015-11-28 DIAGNOSIS — E1122 Type 2 diabetes mellitus with diabetic chronic kidney disease: Secondary | ICD-10-CM | POA: Diagnosis present

## 2015-11-28 DIAGNOSIS — Z87891 Personal history of nicotine dependence: Secondary | ICD-10-CM | POA: Diagnosis not present

## 2015-11-28 DIAGNOSIS — I252 Old myocardial infarction: Secondary | ICD-10-CM | POA: Diagnosis not present

## 2015-11-28 DIAGNOSIS — Z833 Family history of diabetes mellitus: Secondary | ICD-10-CM | POA: Diagnosis not present

## 2015-11-28 DIAGNOSIS — J9601 Acute respiratory failure with hypoxia: Secondary | ICD-10-CM | POA: Diagnosis present

## 2015-11-28 DIAGNOSIS — Z66 Do not resuscitate: Secondary | ICD-10-CM | POA: Diagnosis present

## 2015-11-28 DIAGNOSIS — Z7189 Other specified counseling: Secondary | ICD-10-CM | POA: Diagnosis not present

## 2015-11-28 DIAGNOSIS — I255 Ischemic cardiomyopathy: Secondary | ICD-10-CM | POA: Diagnosis present

## 2015-11-28 DIAGNOSIS — I130 Hypertensive heart and chronic kidney disease with heart failure and stage 1 through stage 4 chronic kidney disease, or unspecified chronic kidney disease: Secondary | ICD-10-CM | POA: Diagnosis present

## 2015-11-28 DIAGNOSIS — J9621 Acute and chronic respiratory failure with hypoxia: Secondary | ICD-10-CM

## 2015-11-28 DIAGNOSIS — H9192 Unspecified hearing loss, left ear: Secondary | ICD-10-CM | POA: Diagnosis present

## 2015-11-28 DIAGNOSIS — Z515 Encounter for palliative care: Secondary | ICD-10-CM | POA: Diagnosis not present

## 2015-11-28 DIAGNOSIS — N183 Chronic kidney disease, stage 3 (moderate): Secondary | ICD-10-CM | POA: Diagnosis present

## 2015-11-28 DIAGNOSIS — G629 Polyneuropathy, unspecified: Secondary | ICD-10-CM | POA: Diagnosis present

## 2015-11-28 DIAGNOSIS — J449 Chronic obstructive pulmonary disease, unspecified: Secondary | ICD-10-CM | POA: Diagnosis not present

## 2015-11-28 DIAGNOSIS — Z7982 Long term (current) use of aspirin: Secondary | ICD-10-CM | POA: Diagnosis not present

## 2015-11-28 DIAGNOSIS — Z79899 Other long term (current) drug therapy: Secondary | ICD-10-CM | POA: Diagnosis not present

## 2015-11-28 DIAGNOSIS — I5022 Chronic systolic (congestive) heart failure: Secondary | ICD-10-CM | POA: Diagnosis present

## 2015-11-28 DIAGNOSIS — E7800 Pure hypercholesterolemia, unspecified: Secondary | ICD-10-CM | POA: Diagnosis present

## 2015-11-28 DIAGNOSIS — J440 Chronic obstructive pulmonary disease with acute lower respiratory infection: Secondary | ICD-10-CM | POA: Diagnosis present

## 2015-11-28 DIAGNOSIS — I2510 Atherosclerotic heart disease of native coronary artery without angina pectoris: Secondary | ICD-10-CM | POA: Diagnosis present

## 2015-11-28 DIAGNOSIS — J690 Pneumonitis due to inhalation of food and vomit: Secondary | ICD-10-CM | POA: Diagnosis present

## 2015-11-28 DIAGNOSIS — J189 Pneumonia, unspecified organism: Secondary | ICD-10-CM | POA: Diagnosis not present

## 2015-11-28 DIAGNOSIS — Z8249 Family history of ischemic heart disease and other diseases of the circulatory system: Secondary | ICD-10-CM | POA: Diagnosis not present

## 2015-11-28 DIAGNOSIS — R1313 Dysphagia, pharyngeal phase: Secondary | ICD-10-CM | POA: Diagnosis present

## 2015-11-28 DIAGNOSIS — Z794 Long term (current) use of insulin: Secondary | ICD-10-CM | POA: Diagnosis not present

## 2015-11-28 DIAGNOSIS — R1311 Dysphagia, oral phase: Secondary | ICD-10-CM | POA: Diagnosis present

## 2015-11-28 DIAGNOSIS — K219 Gastro-esophageal reflux disease without esophagitis: Secondary | ICD-10-CM | POA: Diagnosis present

## 2015-11-28 DIAGNOSIS — J441 Chronic obstructive pulmonary disease with (acute) exacerbation: Secondary | ICD-10-CM | POA: Diagnosis present

## 2015-11-28 DIAGNOSIS — L89152 Pressure ulcer of sacral region, stage 2: Secondary | ICD-10-CM | POA: Diagnosis present

## 2015-11-28 DIAGNOSIS — Z9049 Acquired absence of other specified parts of digestive tract: Secondary | ICD-10-CM | POA: Diagnosis not present

## 2015-11-28 DIAGNOSIS — R0602 Shortness of breath: Secondary | ICD-10-CM | POA: Diagnosis present

## 2015-11-28 DIAGNOSIS — E611 Iron deficiency: Secondary | ICD-10-CM | POA: Diagnosis present

## 2015-11-28 LAB — GLUCOSE, CAPILLARY
GLUCOSE-CAPILLARY: 137 mg/dL — AB (ref 65–99)
GLUCOSE-CAPILLARY: 175 mg/dL — AB (ref 65–99)
Glucose-Capillary: 154 mg/dL — ABNORMAL HIGH (ref 65–99)

## 2015-11-28 LAB — COMPREHENSIVE METABOLIC PANEL
ALT: 11 U/L — AB (ref 17–63)
AST: 10 U/L — AB (ref 15–41)
Albumin: 2.1 g/dL — ABNORMAL LOW (ref 3.5–5.0)
Alkaline Phosphatase: 56 U/L (ref 38–126)
Anion gap: 9 (ref 5–15)
BUN: 24 mg/dL — AB (ref 6–20)
CHLORIDE: 103 mmol/L (ref 101–111)
CO2: 29 mmol/L (ref 22–32)
CREATININE: 1.05 mg/dL (ref 0.61–1.24)
Calcium: 8.1 mg/dL — ABNORMAL LOW (ref 8.9–10.3)
GFR calc non Af Amer: >60 mL/min (ref >60)
Glucose, Bld: 157 mg/dL — ABNORMAL HIGH (ref 65–99)
Potassium: 3.7 mmol/L (ref 3.5–5.1)
SODIUM: 141 mmol/L (ref 135–145)
Total Bilirubin: 0.7 mg/dL (ref 0.3–1.2)
Total Protein: 5.8 g/dL — ABNORMAL LOW (ref 6.5–8.1)

## 2015-11-28 LAB — CBC WITH DIFFERENTIAL/PLATELET
BASOS PCT: 0 %
Basophils Absolute: 0 10*3/uL (ref 0.0–0.1)
EOS PCT: 0 %
Eosinophils Absolute: 0 10*3/uL (ref 0.0–0.7)
HEMATOCRIT: 28.3 % — AB (ref 39.0–52.0)
Hemoglobin: 8.6 g/dL — ABNORMAL LOW (ref 13.0–17.0)
Lymphocytes Relative: 10 %
Lymphs Abs: 0.4 10*3/uL — ABNORMAL LOW (ref 0.7–4.0)
MCH: 19 pg — AB (ref 26.0–34.0)
MCHC: 30.4 g/dL (ref 30.0–36.0)
MCV: 62.6 fL — AB (ref 78.0–100.0)
MONO ABS: 0.2 10*3/uL (ref 0.1–1.0)
MONOS PCT: 5 %
NEUTROS PCT: 85 %
Neutro Abs: 3.8 10*3/uL (ref 1.7–7.7)
PLATELETS: 153 10*3/uL (ref 150–400)
RBC: 4.52 MIL/uL (ref 4.22–5.81)
RDW: 17.9 % — AB (ref 11.5–15.5)
WBC: 4.4 10*3/uL (ref 4.0–10.5)

## 2015-11-28 LAB — HIV ANTIBODY (ROUTINE TESTING W REFLEX): HIV SCREEN 4TH GENERATION: NONREACTIVE

## 2015-11-28 MED ORDER — FUROSEMIDE 10 MG/ML IJ SOLN
40.0000 mg | Freq: Two times a day (BID) | INTRAMUSCULAR | Status: DC
  Administered 2015-11-28 – 2015-11-30 (×4): 40 mg via INTRAVENOUS
  Filled 2015-11-28 (×4): qty 4

## 2015-11-28 MED ORDER — PREDNISONE 20 MG PO TABS
40.0000 mg | ORAL_TABLET | Freq: Every day | ORAL | Status: DC
  Administered 2015-11-29: 40 mg via ORAL
  Filled 2015-11-28: qty 2

## 2015-11-28 NOTE — Progress Notes (Signed)
[facility] PROGRESS NOTE   [name] [id number] DOB: 1924/05/23 DOA: 11/27/2015 PCP:  Melinda Crutch, MD  HPI/Subjective: Patient coughing and not able to clear his throat holding the suction handpiece. I helped him with the suction, he has very minimal to no gag reflex.  Assessment/Plan: Principal Problem:   HCAP (healthcare-associated pneumonia) Active Problems:   Chronic systolic CHF (congestive heart failure) (HCC)   Pneumonia   Hypoxic respiratory failure: Likely due to pneumonia w/wo COPD exacerbation  Started on antibiotics for HCAP on vancomycin, cefepime and azithromycin. He was recently treated for Escherichia coli pneumonia, this is likely aspiration. He does not have leukocytosis or fever, doubt pneumonia, he is probably aspirating. Treated recently for Escherichia coli pneumonia and bacteremia with ciprofloxacin. Wean oxygen, discontinue IV antibiotics in a.m. and start Augmentin.  Systolic CHF: Echo in March 2017 with EF of 40-45% No vascular congestin in CXR Continue lasix, BB and ARB  Anemia : Microcytic : iron deficiency adn chronic disease At baseline Colonoscopy in 2005 was with benign polyps Continue iron supplements   HTN: continue home meds  DMII: Continue lantus 5 u QHS and keep on SS low correction  Stage II pressure ulcer: Wound care  Code Status: Full Code Family Communication: Plan discussed with the patient. Disposition Plan: Remains inpatient Diet: Diet Heart Room service appropriate?: Yes; Fluid consistency:: Thin  Consultants:  PCCM  Procedures:  None  Antibiotics:  Vancomycin, cefepime and azithromycin (indicate start date, and stop date if known)   Objective: Filed Vitals:   11/28/15 0824 11/28/15 1221  BP: 127/68 147/57  Pulse: 61 77  Temp:  98.6 F (37 C)  Resp:  20    Intake/Output Summary (Last 24 hours) at 11/28/15 1332 Last data filed at 11/28/15 0900  Gross per 24 hour  Intake    120 ml    Output    850 ml  Net   -730 ml   Filed Weights   11/27/15 1040  Weight: 71.804 kg (158 lb 4.8 oz)    Exam: General: Alert and awake, oriented x3, not in any acute distress. HEENT: anicteric sclera, pupils reactive to light and accommodation, EOMI CVS: S1-S2 clear, no murmur rubs or gallops Chest: clear to auscultation bilaterally, no wheezing, rales or rhonchi Abdomen: soft nontender, nondistended, normal bowel sounds, no organomegaly Extremities: no cyanosis, clubbing or edema noted bilaterally Neuro: Cranial nerves II-XII intact, no focal neurological deficits  Data Reviewed: Basic Metabolic Panel:  Recent Labs Lab 11/27/15 0648 11/28/15 0405  NA 141 141  K 3.9 3.7  CL 102 103  CO2 30 29  GLUCOSE 158* 157*  BUN 22* 24*  CREATININE 1.04 1.05  CALCIUM 8.7* 8.1*   Liver Function Tests:  Recent Labs Lab 11/28/15 0405  AST 10*  ALT 11*  ALKPHOS 56  BILITOT 0.7  PROT 5.8*  ALBUMIN 2.1*   No results for input(s): LIPASE, AMYLASE in the last 168 hours. No results for input(s): AMMONIA in the last 168 hours. CBC:  Recent Labs Lab 11/27/15 0648 11/28/15 0405  WBC 6.9 4.4  NEUTROABS 5.7 3.8  HGB 8.9* 8.6*  HCT 28.6* 28.3*  MCV 62.7* 62.6*  PLT 170 153   Cardiac Enzymes: No results for input(s): CKTOTAL, CKMB, CKMBINDEX, TROPONINI in the last 168 hours. BNP (last 3 results)  Recent Labs  09/20/15 1833 10/26/15 0245 11/09/15 1923  BNP 196.1* 631.1* 423.8*    ProBNP (last 3 results) No results for input(s): PROBNP in the last 8760 hours.  CBG:  Recent Labs Lab 11/27/15 1125 11/27/15 1724 11/28/15 0748 11/28/15 1128  GLUCAP 168* 145* 137* 175*    Micro Recent Results (from the past 240 hour(s))  Culture, sputum-assessment     Status: None   Collection Time: 11/27/15 11:30 AM  Result Value Ref Range Status   Specimen Description SPUTUM  Final   Special Requests NONE  Final   Sputum evaluation THIS SPECIMEN IS ACCEPTABLE FOR SPUTUM  CULTURE  Final   Report Status 11/27/2015 FINAL  Final  MRSA PCR Screening     Status: Abnormal   Collection Time: 11/27/15 11:30 AM  Result Value Ref Range Status   MRSA by PCR POSITIVE (A) NEGATIVE Final    Comment:        The GeneXpert MRSA Assay (FDA approved for NASAL specimens only), is one component of a comprehensive MRSA colonization surveillance program. It is not intended to diagnose MRSA infection nor to guide or monitor treatment for MRSA infections. RESULT CALLED TO, READ BACK BY AND VERIFIED WITH: E.MACABUEAG RN AT [id number] ON 11/27/15 BY S.VANHOORNE      Studies: Dg Chest 2 View  11/27/2015  CLINICAL DATA:  Dyspnea, cough and congestion. Low oxygen saturations despite nebulizer treatments. EXAM: CHEST  2 VIEW COMPARISON:  11/10/2015 FINDINGS: There is consolidation in the right lower lobe posteriorly, consistent with pneumonia but aspiration or hemorrhage could also produce this appearance. The left lung is clear except for unchanged linear basilar scarring or atelectasis. The pulmonary vasculature is normal. There are no pleural effusions. Hilar and mediastinal contours are unremarkable and unchanged. IMPRESSION: Right lower lobe consolidation posteriorly, likely pneumonia. Pulmonary hemorrhage could also produce this appearance. Electronically Signed   By: Andreas Newport M.D.   On: 11/27/2015 06:44    Scheduled Meds: . acidophilus  2 capsule Oral Daily  . aspirin EC  81 mg Oral Daily  . azithromycin  500 mg Intravenous Q24H  . carvedilol  12.5 mg Oral BID WC  . ceFEPime (MAXIPIME) IV  1 g Intravenous 3 times per day  . Chlorhexidine Gluconate Cloth  6 each Topical Q0600  . enoxaparin (LOVENOX) injection  40 mg Subcutaneous Daily  . ferrous sulfate  325 mg Oral BID WC  . furosemide  40 mg Oral Daily  . insulin aspart  0-9 Units Subcutaneous TID WC  . insulin glargine  5 Units Subcutaneous Daily  . linaclotide  145 mcg Oral Q breakfast  . losartan  25 mg Oral  Daily  . magic mouthwash  5 mL Oral QID  . mirabegron ER  50 mg Oral Daily  . mometasone-formoterol  2 puff Inhalation BID  . mupirocin ointment  1 application Nasal BID  . pantoprazole  40 mg Oral Daily  . polyethylene glycol  17 g Oral BID  . [START ON 11/29/2015] predniSONE  40 mg Oral Q breakfast  . pregabalin  75 mg Oral BID  . protein supplement  2 packet Oral BID  . senna-docusate  1 tablet Oral BID  . simvastatin  10 mg Oral q1800  . vancomycin  1,250 mg Intravenous Q24H   Continuous Infusions:      Time spent: 35 minutes    [facility] A  [facility] Pager [phone] If 7PM-7AM, please contact night-coverage at [email or web address], password [facility] 11/28/2015, 1:32 PM  LOS: 1 day

## 2015-11-28 NOTE — Progress Notes (Signed)
Pt choked on 1 pill, he finally coughed it up after a few minutes. No respiratory problems pt was able to breath without any problems. Has O2 @4  L via [place]. Coughing up thick sputum, using a yankuer to suction his own mouth,tolerating well. Medicines crushed and put in apple sauce and capsules put in apple sauce, pt swallowed them without any further choking.

## 2015-11-28 NOTE — Progress Notes (Signed)
Name: [name] MRN: [id number] DOB: October 19, 1923    ADMISSION DATE:  11/27/2015 CONSULTATION DATE:  11/28/2015  REFERRING MD :  Dreama Saa, [facility]  CHIEF COMPLAINT:  Persistent  RLL infiltrate   HISTORY OF PRESENT ILLNESS:  82 year old man with COPD was admitted last week of March with the right lower lobe pneumonia with nodular infiltrates on CT and Escherichia coli UTI. He was treated with antibiotic steroids and bronchodilators and discharged to short-term rehabilitation on nasal cannula. He completed his antibiotic course. He was also maintained on Lasix, Coreg and Cozaar for her chronic systolic CHF. Speech evaluation was obtained for dysphagia He was transferred from the rehabilitation facility 4/15  due to dyspnea and hypoxia. He reports cough with inability to clear his sputum, denies fevers or chest pain Chest x-ray showed right lower lobe consolidation and we're consulted to comment on persistent right lower lobe infiltrate He is now on 5 L nasal cannula  He is a retired Dealer and worked with brakes, smoked until [place] 11/13/2013 > FEV1 1.46 (45%) ratio 52 I note 5 hospital/ED visits in the last 6 months PMH - COPD, CKD stage III, HTN, ischemic cardiomyopathy, systolic CHF last EF [id number]  SIGNIFICANT EVENTS hospital admission from  11/09/15-11/15/15 with acute respiratory failure from HCAP, COPD exacerbation,e.coli UTI 3/12- 3/21 HCAP, acute hypercarbic respiratory failure requiring brief mechanical ventilation  3/2 through 3/11 where he was treated for small bowel obstruction and ileus.   STUDIES:  3/29 CT chest-nodular infiltrates worst in the right middle and lower lobe likely represents acute infection 3/3 CT abdomen right lower lobe appears clear  3/14 echo decreased systolic function [id number], akinesis of the apex and septum   SUBJECTIVE:  Afebrile Very hard of hearing On 4L [place] Able to suction himself  VITAL SIGNS: Temp:  [97.9 F (36.6 C)-98.1 F (36.7 C)]  98.1 F (36.7 C) (04/16 0614) Pulse Rate:  [61-83] 61 (04/16 0824) Resp:  [18] 18 (04/16 0614) BP: (118-139)/(52-86) 127/68 mmHg (04/16 0824) SpO2:  [93 %-96 %] 96 % (04/16 0750)  PHYSICAL EXAMINATION: Gen. Pleasant, well-nourished, in no distress, normal affect ENT - no lesions, no post nasal drip,  very hard of hearing Neck: No JVD, no thyromegaly, no carotid bruits Lungs: no use of accessory muscles, no dullness to percussion, right lower lobe coarse rales, no rhonchi  Cardiovascular: Rhythm regular, heart sounds  normal, no murmurs or gallops, no peripheral edema Abdomen: soft and non-tender, no hepatosplenomegaly, BS normal. Musculoskeletal: No deformities, no cyanosis or clubbing Neuro:  alert, non focal    Recent Labs Lab 11/27/15 0648 11/28/15 0405  NA 141 141  K 3.9 3.7  CL 102 103  CO2 30 29  BUN 22* 24*  CREATININE 1.04 1.05  GLUCOSE 158* 157*    Recent Labs Lab 11/27/15 0648 11/28/15 0405  HGB 8.9* 8.6*  HCT 28.6* 28.3*  WBC 6.9 4.4  PLT 170 153   Dg Chest 2 View  11/27/2015  CLINICAL DATA:  Dyspnea, cough and congestion. Low oxygen saturations despite nebulizer treatments. EXAM: CHEST  2 VIEW COMPARISON:  11/10/2015 FINDINGS: There is consolidation in the right lower lobe posteriorly, consistent with pneumonia but aspiration or hemorrhage could also produce this appearance. The left lung is clear except for unchanged linear basilar scarring or atelectasis. The pulmonary vasculature is normal. There are no pleural effusions. Hilar and mediastinal contours are unremarkable and unchanged. IMPRESSION: Right lower lobe consolidation posteriorly, likely pneumonia. Pulmonary hemorrhage could also produce this appearance. Electronically  Signed   By: Andreas Newport M.D.   On: 11/27/2015 06:44    ASSESSMENT / PLAN:  Acute hypoxic respiratory failure Gold stage III/ D COPD with recurrent admits in the last few months Right lower lobe HCAP- although this  infiltrate is persistent when compared to chest x-ray from late March this is new compared to prior-hence I'm not worried about malignancy.  Recurrent aspiration probable  He does not appear to be in overt failure Recurrent  admissions are a concern  Recomm - I agree with empiric broad-spectrum antibiotics for HCAP / nosocomial aspiration Repeat swallow assessment shows mod aspn risk Can taper steroids rapidly as his bronchospasm resolves Chest x-ray can be followed as outpatient to resolution  PCCM available as needed  Kara Mead MD. FCCP. [facility] Pager [phone] If no response call [phone]   11/28/2015   11/28/2015, 11:10 AM

## 2015-11-29 ENCOUNTER — Inpatient Hospital Stay (HOSPITAL_COMMUNITY): Payer: Medicare Other

## 2015-11-29 DIAGNOSIS — L98429 Non-pressure chronic ulcer of back with unspecified severity: Secondary | ICD-10-CM | POA: Diagnosis present

## 2015-11-29 LAB — GLUCOSE, CAPILLARY
GLUCOSE-CAPILLARY: 196 mg/dL — AB (ref 65–99)
GLUCOSE-CAPILLARY: 235 mg/dL — AB (ref 65–99)
Glucose-Capillary: 153 mg/dL — ABNORMAL HIGH (ref 65–99)
Glucose-Capillary: 105 mg/dL — ABNORMAL HIGH (ref 65–99)

## 2015-11-29 LAB — LEGIONELLA PNEUMOPHILA SEROGP 1 UR AG: L. pneumophila Serogp 1 Ur Ag: NEGATIVE

## 2015-11-29 MED ORDER — POTASSIUM CHLORIDE CRYS ER 20 MEQ PO TBCR
40.0000 meq | EXTENDED_RELEASE_TABLET | Freq: Once | ORAL | Status: AC
  Administered 2015-11-29: 40 meq via ORAL
  Filled 2015-11-29: qty 2

## 2015-11-29 MED ORDER — CARVEDILOL 12.5 MG PO TABS
12.5000 mg | ORAL_TABLET | Freq: Two times a day (BID) | ORAL | Status: DC
  Administered 2015-11-30 – 2015-12-03 (×4): 12.5 mg via ORAL
  Filled 2015-11-29 (×4): qty 1

## 2015-11-29 NOTE — Clinical Documentation Improvement (Signed)
Internal Medicine  Can the diagnosis of pneumonia be further specified?   Type of Pneumonia - CAP, HCAP, Bacterial Pneumonia, Aspiration Pneumonia, Gram Negative Pneumonia, Other Condition, Unable to Clinically Determine  Document causative organism if known  Document mechanism - Aspiration, Ventilator - Associated, Radiation Induced, Other (Specify)  Document any associated diagnoses/conditions such as Respiratory Failure, Sepsis, Underlying Lung Disease (Specify), Other (Specify)  Other condition  Clinically Undetermined   Supporting Information: (As per notes) "He was recently treated for Escherichia coli pneumonia, this is likely &aspiration. ", "I agree with empiric broad-spectrum antibiotics for HCAP / nosocomial &aspiration  Repeat swallow assessment shows mod aspn risk."   Please exercise your independent, professional judgment when responding. A specific answer is not anticipated or expected.  Thank You, Alessandra Grout, RN, BSN, CCDS,Clinical Documentation Specialist:  [phone]  [phone]=[facility]- Health Information Management

## 2015-11-29 NOTE — Progress Notes (Signed)
[facility] PROGRESS NOTE   [name] [id number] DOB: Mar 29, 1924 DOA: 11/27/2015 PCP:  Melinda Crutch, MD  HPI/Subjective: With his cough and thick sputum and the back of his throat, did NT suction with a nurse at bedside. Does have very minimal gag reflex. MBSS for swallow evaluation later today   Assessment/Plan: Principal Problem:   HCAP (healthcare-associated pneumonia) Active Problems:   Chronic systolic CHF (congestive heart failure) (HCC)   Pneumonia   Sacral ulcer   Hypoxic respiratory failure: Likely due to pneumonia w/wo COPD exacerbation  Started on antibiotics for HCAP on vancomycin, cefepime and azithromycin. He was recently treated for Escherichia coli pneumonia, this is likely aspiration. He does not have leukocytosis or fever, doubt pneumonia, he is probably aspirating. Treated recently for Escherichia coli pneumonia and bacteremia with ciprofloxacin. Wean oxygen, discontinue IV antibiotics in a.m. and start Augmentin.  Systolic CHF: Echo in March 2017 with EF of 40-45% No vascular congestin in CXR Continue lasix, BB and ARB  Anemia : Microcytic : iron deficiency adn chronic disease At baseline Colonoscopy in 2005 was with benign polyps Continue iron supplements   HTN: continue home meds  DMII: Continue lantus 5 u QHS and keep on SS low correction  Stage II pressure ulcer: Wound care.   Code Status: Full Code Family Communication: Plan discussed with the patient. Disposition Plan: Remains inpatient Diet: Diet Heart Room service appropriate?: Yes; Fluid consistency:: Thin  Consultants:  PCCM  Procedures:  None  Antibiotics:  Vancomycin, cefepime and azithromycin (indicate start date, and stop date if known)   Objective: Filed Vitals:   11/29/15 0444 11/29/15 0819  BP: 157/59 157/59  Pulse: 73 73  Temp: 97.3 F (36.3 C)   Resp: 20     Intake/Output Summary (Last 24 hours) at 11/29/15 1310 Last data filed at 11/29/15  0446  Gross per 24 hour  Intake    240 ml  Output   1325 ml  Net  -1085 ml   Filed Weights   11/27/15 1040  Weight: 71.804 kg (158 lb 4.8 oz)    Exam: General: Alert and awake, oriented x3, not in any acute distress. HEENT: anicteric sclera, pupils reactive to light and accommodation, EOMI CVS: S1-S2 clear, no murmur rubs or gallops Chest: clear to auscultation bilaterally, no wheezing, rales or rhonchi Abdomen: soft nontender, nondistended, normal bowel sounds, no organomegaly Extremities: no cyanosis, clubbing or edema noted bilaterally Neuro: Cranial nerves II-XII intact, no focal neurological deficits  Data Reviewed: Basic Metabolic Panel:  Recent Labs Lab 11/27/15 0648 11/28/15 0405  NA 141 141  K 3.9 3.7  CL 102 103  CO2 30 29  GLUCOSE 158* 157*  BUN 22* 24*  CREATININE 1.04 1.05  CALCIUM 8.7* 8.1*   Liver Function Tests:  Recent Labs Lab 11/28/15 0405  AST 10*  ALT 11*  ALKPHOS 56  BILITOT 0.7  PROT 5.8*  ALBUMIN 2.1*   No results for input(s): LIPASE, AMYLASE in the last 168 hours. No results for input(s): AMMONIA in the last 168 hours. CBC:  Recent Labs Lab 11/27/15 0648 11/28/15 0405  WBC 6.9 4.4  NEUTROABS 5.7 3.8  HGB 8.9* 8.6*  HCT 28.6* 28.3*  MCV 62.7* 62.6*  PLT 170 153   Cardiac Enzymes: No results for input(s): CKTOTAL, CKMB, CKMBINDEX, TROPONINI in the last 168 hours. BNP (last 3 results)  Recent Labs  09/20/15 1833 10/26/15 0245 11/09/15 1923  BNP 196.1* 631.1* 423.8*    ProBNP (last 3 results) No results  for input(s): PROBNP in the last 8760 hours.  CBG:  Recent Labs Lab 11/28/15 0748 11/28/15 1128 11/28/15 1643 11/29/15 0735 11/29/15 1150  GLUCAP 137* 175* 154* 153* 196*    Micro Recent Results (from the past 240 hour(s))  Blood Culture (routine x 2)     Status: None (Preliminary result)   Collection Time: 11/27/15  6:50 AM  Result Value Ref Range Status   Specimen Description BLOOD RIGHT HAND  5 ML  IN [facility] BOTTLE  Final   Special Requests NONE  Final   Culture   Final    NO GROWTH 1 DAY Performed at [facility]    Report Status PENDING  Incomplete  Blood Culture (routine x 2)     Status: None (Preliminary result)   Collection Time: 11/27/15  8:25 AM  Result Value Ref Range Status   Specimen Description BLOOD LEFT WRIST  5 ML IN [facility] BOTTLE  Final   Special Requests NONE  Final   Culture   Final    NO GROWTH 1 DAY Performed at [facility]    Report Status PENDING  Incomplete  Culture, sputum-assessment     Status: None   Collection Time: 11/27/15 11:30 AM  Result Value Ref Range Status   Specimen Description SPUTUM  Final   Special Requests NONE  Final   Sputum evaluation THIS SPECIMEN IS ACCEPTABLE FOR SPUTUM CULTURE  Final   Report Status 11/27/2015 FINAL  Final  MRSA PCR Screening     Status: Abnormal   Collection Time: 11/27/15 11:30 AM  Result Value Ref Range Status   MRSA by PCR POSITIVE (A) NEGATIVE Final    Comment:        The GeneXpert MRSA Assay (FDA approved for NASAL specimens only), is one component of a comprehensive MRSA colonization surveillance program. It is not intended to diagnose MRSA infection nor to guide or monitor treatment for MRSA infections. RESULT CALLED TO, READ BACK BY AND VERIFIED WITH: E.MACABUEAG RN AT [id number] ON 11/27/15 BY S.VANHOORNE   Culture, respiratory (NON-Expectorated)     Status: None (Preliminary result)   Collection Time: 11/27/15 11:30 AM  Result Value Ref Range Status   Specimen Description SPUTUM  Final   Special Requests NONE  Final   Gram Stain   Final    FEW WBC PRESENT,BOTH PMN AND MONONUCLEAR FEW SQUAMOUS EPITHELIAL CELLS PRESENT MODERATE GRAM POSITIVE COCCI IN CLUSTERS FEW GRAM POSITIVE RODS FEW GRAM NEGATIVE RODS Performed at [facility]    Culture   Final    Culture reincubated for better growth Performed at [facility]    Report Status PENDING  Incomplete      Studies: No results found.  Scheduled Meds: . acidophilus  2 capsule Oral Daily  . aspirin EC  81 mg Oral Daily  . azithromycin  500 mg Intravenous Q24H  . carvedilol  12.5 mg Oral BID WC  . Chlorhexidine Gluconate Cloth  6 each Topical Q0600  . enoxaparin (LOVENOX) injection  40 mg Subcutaneous Daily  . ferrous sulfate  325 mg Oral BID WC  . furosemide  40 mg Intravenous BID  . insulin aspart  0-9 Units Subcutaneous TID WC  . insulin glargine  5 Units Subcutaneous Daily  . linaclotide  145 mcg Oral Q breakfast  . losartan  25 mg Oral Daily  . magic mouthwash  5 mL Oral QID  . mirabegron ER  50 mg Oral Daily  . mometasone-formoterol  2 puff  Inhalation BID  . mupirocin ointment  1 application Nasal BID  . pantoprazole  40 mg Oral Daily  . polyethylene glycol  17 g Oral BID  . potassium chloride  40 mEq Oral Once  . pregabalin  75 mg Oral BID  . protein supplement  2 packet Oral BID  . senna-docusate  1 tablet Oral BID  . simvastatin  10 mg Oral q1800   Continuous Infusions:      Time spent: 35 minutes    [facility] A  [facility] Pager [phone] If 7PM-7AM, please contact night-coverage at [email or web address], password [facility] 11/29/2015, 1:10 PM  LOS: 2 days

## 2015-11-29 NOTE — Progress Notes (Signed)
Reorder for swallow evaluation received, will plan MBS due to pt's overt difficulties.  Scheduled for today at approximately 1300.  Thanks. Luanna Salk, [place] [facility] SLP [phone]

## 2015-11-29 NOTE — Clinical Social Work Note (Signed)
Clinical Social Work Assessment  Patient Details  Name: [name] MRN: [id number] Date of Birth: 04-Aug-1924  Date of referral:  11/29/15               Reason for consult:  Facility Placement, Discharge Planning                Permission sought to share information with:  Family Supports Permission granted to share information::  Yes, Verbal Permission Granted  Name::     Medical sales representative::     Relationship::  Daughter  Contact Information:     Housing/Transportation Living arrangements for the past 2 months:  [place], [place] of Information:  Patient, Adult Children Patient Interpreter Needed:  None Criminal Activity/Legal Involvement Pertinent to Current Situation/Hospitalization:  No - Comment as needed Significant Relationships:  Adult Children Lives with:  Facility Resident Do you feel safe going back to the place where you live?  Yes Need for family participation in patient care:  No (Coment)  Care giving concerns:  Pt admitted from [facility]. PT not yet ordered.   Social Worker assessment / plan:  CSW reviewed pt chart and pt was admitted from [facility]. BSW Intern met with pt at bedside. BSW introduced self and explained role. Pt confirmed he has been at [facility] for about two weeks. Pt requesting BSW Inter to contact daughter, [name]. Pt is agreeable to return to [facility] if his daughter states that is the plan.   BSW Intern contacted pt daughter, [name] via telephone. Pr daughter states pt has a room at [facility] starting on the 22nd of April but believes pt will need more rehab at [facility] before moving to [facility]. Pt daughter unaware if pt still has room at [facility] and is inquiring about what day in insurance coverage the pt is on.   BSW Intern contacted [employer] coverage, [place]. Rollene Fare asked for pt daughter, [name] to give them a call in about an hour to check on insurance day. BSW Intern called [name] back  and gave Regina's number at [facility] to call.  CSW continuing to follow and assist with dc plans when appropriate.   Employment status:  Retired Forensic scientist:  Medicare PT Recommendations:  Not assessed at this time Information / Referral to community resources:     Patient/Family's Response to care:  Pt alert and oriented x4. Pt and pt daughter agreeable to return to [facility] upon dc.  Patient/Family's Understanding of and Emotional Response to Diagnosis, Current Treatment, and Prognosis:  Pt and pt daughter report no further questions at this time.   Emotional Assessment Appearance:  Appears stated age Attitude/Demeanor/Rapport:  Other (Apporpriate) Affect (typically observed):  Accepting, Calm, Quiet Orientation:  Oriented to Self, Oriented to Place, Oriented to  Time, Oriented to Situation Alcohol / Substance use:  Not Applicable Psych involvement (Current and /or in the community):  No (Comment)  Discharge Needs  Concerns to be addressed:  Care Coordination Readmission within the last 30 days:  Yes Current discharge risk:  None Barriers to Discharge:  Continued Medical Work up   [employer], Student-SW 11/29/2015, 12:26 PM

## 2015-11-29 NOTE — Procedures (Addendum)
Objective Swallowing Evaluation: Type of Study: MBS-Modified Barium Swallow Study  Patient Details  Name: [name] MRN: [id number] Date of Birth: May 27, 1924  Today's Date: 11/29/2015 Time: SLP Start Time (ACUTE ONLY): 1317-SLP Stop Time (ACUTE ONLY): 1356 SLP Time Calculation (min) (ACUTE ONLY): 39 min  Past Medical History:  Past Medical History  Diagnosis Date  . Carotid artery stenosis   . Essential hypertension   . Myocardial infarction ([place]) 1979  . Coronary artery disease     Known occluded proximal LAD with collaterals, moderate RCA disease - managed medically 2009  . COPD (chronic obstructive pulmonary disease) ([place])   . History of stroke   . Prostate cancer ([place])   . Arthritis   . Foot drop, right   . Neuropathy ([place])   . Anemia   . Urinary incontinence, male, stress   . GERD (gastroesophageal reflux disease)   . Thalassemia   . Wears dentures   . Deafness in left ear     Wears hearing aide in right ear  . Sleep apnea     CPAP  . Ischemic cardiomyopathy     LVEF 40-45%  . Hypercholesteremia   . Anemia   . Osteoarthritis   . Stroke ([place])   . Heart attack ([place])   . Vertigo   . Hereditary and idiopathic peripheral neuropathy 03/01/2015  . Abnormality of gait 03/01/2015   Past Surgical History:  Past Surgical History  Procedure Laterality Date  . Penile prosthesis implant    . Cholecystectomy    . Right carotid endarterectomy    . Back surgery    . Lumbar laminectomy  03/24/10    L3-4 and L4-5  . Ankle surgery      Bone infection  . Cardiac catheterization    . Appendectomy    . Joint replacement Left   . Radioactive seed implant  2009  . Pars plana vitrectomy  03/19/2012    Procedure: PARS PLANA VITRECTOMY WITH 25 GAUGE;  Surgeon: Hayden Pedro, MD;  Location: [facility];  Service: Ophthalmology;  Laterality: Right;  . Carotid endarterectomy Right     CEA - 15 yrs ago  . Spine surgery    . Eye surgery    . Cataract extraction w/ intraocular lens implant  Left   . Endarterectomy Left 09/28/2014    Procedure: Left Carotid Endarterectomy;  Surgeon: Rosetta Posner, MD;  Location: [facility];  Service: Vascular;  Laterality: Left;  . Patch angioplasty Left 09/28/2014    Procedure: WITH DACRON PATCH ANGIOPLASTY;  Surgeon: Rosetta Posner, MD;  Location: [facility];  Service: Vascular;  Laterality: Left;   HPI: 82 yo male with history of systolic HF, HTN, CKDIII, COPD, DM II, stroke GERD, deaf in left ear and peripheral neuropathy who was brought here from rehab due to dyspnea and hypoxia. Per chart recent admission for for acute respiratory failure due to COPD exacerbation / HCAP (11/09/15). CXR right lower lobe consolidation posteriorly, likely pneumonia. Pulmonary hemorrhage could also produce this appearance. No previous ST notes found in EPIC.  MD documented pt with difficulty managing secretions and he assisted pt with oral suctioning.    CXR showed right lower lobe consolidation - posterior concerning for pna.    Subjective: pt awake in chair  Assessment / Plan / Recommendation  CHL IP CLINICAL IMPRESSIONS 11/29/2015  Therapy Diagnosis Moderate oral phase dysphagia;Severe pharyngeal phase dysphagia  Clinical Impression Moderate oral and severe pharyngeal dysphagia characterized mostly by gross weakness.  Pt with very poor muscle contraction resulting in severe residuals with pt only clearing approximately 20% into esophagus.  Remainder was retained in pharynx and required cued expectoration to clear.    Trace laryngeal penetration/aspiration of liquids noted without cough response- with every liquid swallow. Pt with open larynx throughout entire swallow despite chin tuck posture.  Penetrates were moved to to upper larynx with cued throat clear but would consistently re-penetrate.    Of note, pt did cough x1 during testing - not associated with aspiration.   Various techniques including effortful swallow, following solids with liquids not helpful.   Fortunately,  pt able to cough/"hock" and expectorate residuals with cueing.    He is however grossly weak and just participating in this MBS was exhausting for him and pt only consumed approximately six boluses.   Pt states his swallowing ability is as good as normal and he admits to dysphagia x one year.    Pt's level of dysphagia does not allow him to meet nutritional needs   At this time based on this test and pt report, pt will aspirate intake and secretions.  Concern is present for swallowing prognosis given his report of chronic deficits.  If pt is to continue po, it should be with known risks and for comfort.    Will follow up for family education and to determine if any assistance/treatment indicated/helpful.  Educated pt to findings/recommendations live using monitor.     Impact on safety and function --      CHL IP TREATMENT RECOMMENDATION 11/27/2015  Treatment Recommendations Therapy as outlined in treatment plan below     Prognosis 11/29/2015  Prognosis for Safe Diet Advancement Guarded  Barriers to Reach Goals --  Barriers/Prognosis Comment --    CHL IP DIET RECOMMENDATION 11/29/2015  SLP Diet Recommendations NPO  Liquid Administration via --  Medication Administration Via alternative means  Compensations Slow rate;Small sips/bites;Other (Comment)  Postural Changes --              CHL IP FREQUENCY AND DURATION 11/29/2015  Speech Therapy Frequency (ACUTE ONLY) min 2x/week  Treatment Duration 1 week           CHL IP ORAL PHASE 11/29/2015  Oral Phase --  Oral - Pudding Teaspoon --  Oral - Pudding Cup --  Oral - Honey Teaspoon --  Oral - Honey Cup --  Oral - Nectar Teaspoon --  Oral - Nectar Cup --  Oral - Nectar Straw Weak lingual manipulation;Delayed oral transit  Oral - Thin Teaspoon Weak lingual manipulation;Delayed oral transit  Oral - Thin Cup --  Oral - Thin Straw Weak lingual manipulation;Delayed oral transit  Oral - Puree Weak lingual manipulation;Delayed oral  transit  Oral - Mech Soft --  Oral - Regular Weak lingual manipulation;Delayed oral transit  Oral - Multi-Consistency --  Oral - Pill --  Oral Phase - Comment --    CHL IP PHARYNGEAL PHASE 11/29/2015  Pharyngeal Phase Impaired  Pharyngeal- Pudding Teaspoon --  Pharyngeal --  Pharyngeal- Pudding Cup --  Pharyngeal --  Pharyngeal- Honey Teaspoon --  Pharyngeal --  Pharyngeal- Honey Cup --  Pharyngeal --  Pharyngeal- Nectar Teaspoon Reduced pharyngeal peristalsis;Reduced epiglottic inversion;Reduced laryngeal elevation;Reduced airway/laryngeal closure;Reduced tongue base retraction;Pharyngeal residue - valleculae;Pharyngeal residue - pyriform  Pharyngeal --  Pharyngeal- Nectar Cup --  Pharyngeal --  Pharyngeal- Nectar Straw Reduced pharyngeal peristalsis;Reduced epiglottic inversion;Reduced airway/laryngeal closure;Reduced laryngeal elevation;Reduced tongue base retraction;Penetration/Apiration after swallow;Pharyngeal residue - valleculae;Pharyngeal residue - pyriform  Pharyngeal  Material enters airway, passes BELOW cords without attempt by patient to eject out (silent aspiration);Material enters airway, remains ABOVE vocal cords and not ejected out;Material enters airway, CONTACTS cords and not ejected out  Pharyngeal- Thin Teaspoon Reduced pharyngeal peristalsis;Reduced epiglottic inversion;Reduced tongue base retraction;Reduced airway/laryngeal closure;Reduced laryngeal elevation;Penetration/Aspiration during swallow;Pharyngeal residue - valleculae;Pharyngeal residue - pyriform  Pharyngeal Material enters airway, remains ABOVE vocal cords and not ejected out  Pharyngeal- Thin Cup --  Pharyngeal --  Pharyngeal- Thin Straw Delayed swallow initiation-pyriform sinuses;Reduced pharyngeal peristalsis;Penetration/Aspiration before swallow;Reduced epiglottic inversion  Pharyngeal --  Pharyngeal- Puree Reduced pharyngeal peristalsis;Reduced epiglottic inversion;Reduced anterior laryngeal  mobility;Reduced laryngeal elevation;Reduced airway/laryngeal closure;Reduced tongue base retraction;Pharyngeal residue - valleculae  Pharyngeal --  Pharyngeal- Mechanical Soft --  Pharyngeal --  Pharyngeal- Regular Reduced pharyngeal peristalsis;Reduced epiglottic inversion;Reduced tongue base retraction;Pharyngeal residue - valleculae  Pharyngeal --  Pharyngeal- Multi-consistency --  Pharyngeal --  Pharyngeal- Pill --  Pharyngeal --  Pharyngeal Comment chin tuck, cued cough, cued dry swallow, cued expectoration effective, dry swallows weak     CHL IP CERVICAL ESOPHAGEAL PHASE 11/29/2015  Cervical Esophageal Phase Impaired  Pudding Teaspoon --  Pudding Cup --  Honey Teaspoon --  Honey Cup --  Nectar Teaspoon --  Nectar Cup --  Nectar Straw --  Thin Teaspoon --  Thin Cup --  Thin Straw --  Puree --  Mechanical Soft --  Regular --  Multi-consistency --  Pill --  Cervical Esophageal Comment --     Luanna Salk, [place] SLP [phone]

## 2015-11-29 NOTE — Progress Notes (Addendum)
Dr. Hartford Poli called about pt's BP. It is 98/48 now. At 1400 it was 100/40. Both were done manually. He ordered to hold the 1700 dose of Coreg. Pt complaining of sore on RT lower gum, using magic mouthwash.

## 2015-11-30 ENCOUNTER — Ambulatory Visit: Payer: Medicare Other | Admitting: Neurology

## 2015-11-30 ENCOUNTER — Ambulatory Visit: Payer: Medicare Other | Admitting: Cardiology

## 2015-11-30 LAB — GLUCOSE, CAPILLARY
GLUCOSE-CAPILLARY: 109 mg/dL — AB (ref 65–99)
GLUCOSE-CAPILLARY: 97 mg/dL (ref 65–99)
Glucose-Capillary: 159 mg/dL — ABNORMAL HIGH (ref 65–99)
Glucose-Capillary: 84 mg/dL (ref 65–99)

## 2015-11-30 LAB — BASIC METABOLIC PANEL
ANION GAP: 7 (ref 5–15)
BUN: 29 mg/dL — ABNORMAL HIGH (ref 6–20)
CALCIUM: 8.4 mg/dL — AB (ref 8.9–10.3)
CO2: 36 mmol/L — ABNORMAL HIGH (ref 22–32)
Chloride: 98 mmol/L — ABNORMAL LOW (ref 101–111)
Creatinine, Ser: 1.24 mg/dL (ref 0.61–1.24)
GFR, EST AFRICAN AMERICAN: 58 mL/min — AB (ref >60)
GFR, EST NON AFRICAN AMERICAN: 50 mL/min — AB (ref >60)
Glucose, Bld: 166 mg/dL — ABNORMAL HIGH (ref 65–99)
Potassium: 3.3 mmol/L — ABNORMAL LOW (ref 3.5–5.1)
Sodium: 141 mmol/L (ref 135–145)

## 2015-11-30 LAB — CBC
HCT: 29.5 % — ABNORMAL LOW (ref 39.0–52.0)
HEMOGLOBIN: 9 g/dL — AB (ref 13.0–17.0)
MCH: 19.1 pg — AB (ref 26.0–34.0)
MCHC: 30.5 g/dL (ref 30.0–36.0)
MCV: 62.8 fL — AB (ref 78.0–100.0)
PLATELETS: 190 10*3/uL (ref 150–400)
RBC: 4.7 MIL/uL (ref 4.22–5.81)
RDW: 17.3 % — ABNORMAL HIGH (ref 11.5–15.5)
WBC: 6.5 10*3/uL (ref 4.0–10.5)

## 2015-11-30 LAB — INFLUENZA VIRUS AG, A+B (DFA)

## 2015-11-30 MED ORDER — CIPROFLOXACIN HCL 500 MG PO TABS: 500.0000 mg | ORAL_TABLET | Freq: Two times a day (BID) | ORAL | Status: DC

## 2015-11-30 MED ORDER — AZITHROMYCIN 250 MG PO TABS
500.0000 mg | ORAL_TABLET | Freq: Every day | ORAL | Status: DC
  Administered 2015-12-02 – 2015-12-03 (×2): 500 mg via ORAL
  Filled 2015-11-30 (×2): qty 2

## 2015-11-30 MED ORDER — POTASSIUM CHLORIDE CRYS ER 20 MEQ PO TBCR
60.0000 meq | EXTENDED_RELEASE_TABLET | Freq: Once | ORAL | Status: AC
  Administered 2015-11-30: 60 meq via ORAL
  Filled 2015-11-30: qty 3

## 2015-11-30 MED ORDER — METRONIDAZOLE 500 MG PO TABS: 500.0000 mg | ORAL_TABLET | Freq: Three times a day (TID) | ORAL | Status: DC

## 2015-11-30 NOTE — Clinical Documentation Improvement (Signed)
Family Medicine  Based on the clinical findings below, please document any associated diagnoses/conditions the patient has or may have.   Hepatic Failure  Hepatic Encephalopathy  Other  Clinically Undetermined  Supporting Information:(As per notes) Cirrhosis, non-alcoholic (HCC) & Ascites  Meds: Lactulose 20 GM/30ML SOLN Take 30 mLs (20 g total) by mouth 3 (three) times daily.  Please exercise your independent, professional judgment when responding. A specific answer is not anticipated or expected.  Thank You, Alessandra Grout, RN, BSN, CCDS,Clinical Documentation Specialist:  [phone]  [phone]=[facility]- Health Information Management

## 2015-11-30 NOTE — Discharge Summary (Signed)
Physician Discharge Summary  [name] [id number] DOB: 08/19/1923 DOA: 11/27/2015  PCP:  Melinda Crutch, MD  Admit date: 11/27/2015 Discharge date: 11/30/2015  Time spent: 40 minutes  Recommendations for Outpatient Follow-up:  1. Follow up with nursing home M.D. 2. Continue ciprofloxacin and metronidazole for 7 more days, end date is 12/07/2015. 3. Please consult palliative care for goals of care in the nursing home.   Discharge Diagnoses:  Principal Problem:   HCAP (healthcare-associated pneumonia) Active Problems:   Chronic systolic CHF (congestive heart failure) ([place])   Pneumonia   Sacral ulcer   Discharge Condition: Stable  Diet recommendation: Heart healthy  Filed Weights   11/27/15 1040  Weight: 71.804 kg (158 lb 4.8 oz)    History of present illness:  Patient is a 82 yo male with history of systolic HF, HTN, CKDIII, COPD, DM II, and peripheral neuropathy who was brought here from rehab due to dyspnea and hypoxia. He was recently admitted 3/28-4/3 for acute respiratory failure due to COPD exacerbation / HCAP and discharged to rehab on oxygen via [place] 2L. Patient is no mobile recently due to worsening peripheral neuropathy. He desated while in bed down to 70s on 2L of oxygen. He has been having rattling voice from his chest, cough with inability to clear sputum, and dyspnea. He also has a sore throat.   Hospital Course:   Acute respiratory failure with hypoxia: Likely due to pneumonia w/wo COPD exacerbation  Started on antibiotics for HCAP on vancomycin, cefepime and azithromycin. He was recently treated for Escherichia coli pneumonia, this is likely aspiration. He probably has no significant pneumonia this time because of no fever, no leukocytosis.  Aspiration pneumonia Patient came in with right lower lobe pneumonia, previously had Escherichia coli pneumonia which likely secondary to aspiration. Seen by SLP and had significant dysphagia. Patient is allergic to  penicillins so discharged on Cipro and Flagyl, for 7 more days. Had previous Escherichia coli so started on Cipro and Flagyl added for coverage of anaerobes.  Dysphagia Patient has very significant dysphagia with large residual and the oropharynx. I have personally did nasotracheal suction, patient has minimal to no gag reflex. Likely secondary to previous strokes. SLP recommended nothing by mouth, patient placed on regular diet with thin liquids. Patient has always going to have aspiration risk, asked to accept that risk. Spoke with the daughter Mrs. Cindy Simpkins and explained the aspiration risk/pneumonia, voiced understanding. Palliative care to be consulted in the nursing home.  Chronic systolic CHF: Echo in March 2017 with EF of 40-45% No vascular congestin in CXR Continue lasix, BB and ARB  Anemia : Microcytic : iron deficiency adn chronic disease At baseline Colonoscopy in 2005 was with benign polyps Continue iron supplements   HTN: continue home meds  DMII: Continue lantus 5 u QHS and keep on SS low correction  Stage II pressure ulcer: Wound care.   Procedures:  None  Consultations:  SLP  Discharge Exam: Filed Vitals:   11/29/15 2152 11/30/15 0539  BP: 122/43 139/49  Pulse: 68 66  Temp: 97.8 F (36.6 C) 97.2 F (36.2 C)  Resp: 18 20  General: Alert and awake, oriented x3, not in any acute distress. HEENT: anicteric sclera, pupils reactive to light and accommodation, EOMI CVS: S1-S2 clear, no murmur rubs or gallops Chest: clear to auscultation bilaterally, no wheezing, rales or rhonchi Abdomen: soft nontender, nondistended, normal bowel sounds, no organomegaly Extremities: no cyanosis, clubbing or edema noted bilaterally Neuro: Cranial nerves II-XII intact, no  focal neurological deficits  Discharge Instructions    Current Discharge Medication List    START taking these medications   Details  ciprofloxacin (CIPRO) 500 MG tablet Take 1 tablet  (500 mg total) by mouth 2 (two) times daily.    metroNIDAZOLE (FLAGYL) 500 MG tablet Take 1 tablet (500 mg total) by mouth 3 (three) times daily.      CONTINUE these medications which have NOT CHANGED   Details  acetaminophen (TYLENOL) 325 MG tablet Take 650 mg by mouth every 6 (six) hours as needed for mild pain.     acidophilus (RISAQUAD) CAPS capsule Take 2 capsules by mouth daily.    albuterol (PROVENTIL) (2.5 MG/3ML) 0.083% nebulizer solution Take 3 mLs (2.5 mg total) by nebulization every 6 (six) hours as needed for wheezing. Qty: 75 mL, Refills: 12    aspirin EC 81 MG tablet Take 81 mg by mouth daily.    carvedilol (COREG) 12.5 MG tablet TAKE 1 TABLET TWICE DAILY WITH A MEAL Qty: 60 tablet, Refills: 11    ferrous sulfate 325 (65 FE) MG tablet Take 1 tablet (325 mg total) by mouth 2 (two) times daily with a meal. Qty: 30 tablet, Refills: 0    furosemide (LASIX) 40 MG tablet Take 40 mg by mouth.    Glycopyrrolate-Formoterol (BEVESPI AEROSPHERE) 9-4.8 MCG/ACT AERO Inhale 2 puffs into the lungs every 12 (twelve) hours.    guaiFENesin (MUCINEX) 600 MG 12 hr tablet Take 1 tablet (600 mg total) by mouth 2 (two) times daily. Please take for 2 weeks.    guaifenesin (ROBITUSSIN) 100 MG/5ML syrup Take 10 mg by mouth every 4 (four) hours as needed for cough.    insulin aspart (NOVOLOG) 100 UNIT/ML injection Inject 0-9 Units into the skin 3 (three) times daily with meals. For CBGs 0-69 = 0 units, 70-120 = 0 units, 121-150 = 1 unit, 151-200 = 2 units, 201-250 = 3 units, 252-300 = 5 units, 301-350 = 7 units, 351-400 = 9 units    insulin glargine (LANTUS) 100 UNIT/ML injection Inject 0.05 mLs (5 Units total) into the skin daily. Qty: 10 mL, Refills: 11    ipratropium-albuterol (DUONEB) 0.5-2.5 (3) MG/3ML SOLN Take 3 mLs by nebulization 3 (three) times daily. Qty: 360 mL    LINZESS 145 MCG CAPS capsule Take 1 capsule by mouth daily with breakfast. Refills: 11    losartan (COZAAR) 25 MG  tablet Take 1 tablet (25 mg total) by mouth daily. Qty: 30 tablet, Refills: 0    magic mouthwash SOLN Take 5 mLs by mouth 4 (four) times daily. Refills: 0    meclizine (ANTIVERT) 25 MG tablet Take 1 tablet (25 mg total) by mouth daily as needed for dizziness. Qty: 30 tablet, Refills: 6    mometasone-formoterol (DULERA) 200-5 MCG/ACT AERO Inhale 2 puffs into the lungs 2 (two) times daily. Qty: 1 Inhaler, Refills: 5    MYRBETRIQ 50 MG TB24 tablet Take 50 mg by mouth daily. Refills: 11    NITROSTAT 0.4 MG SL tablet PLACE 1 TABLET (0.4 MG TOTAL) UNDER THE TONGUE EVERY 5 (FIVE) MINUTES AS NEEDED. FOR CHEST PAIN Qty: 25 tablet, Refills: 3    omeprazole (PRILOSEC) 20 MG capsule Take 40 mg by mouth daily.    polyethylene glycol (MIRALAX / GLYCOLAX) packet Take 17 g by mouth 2 (two) times daily. Qty: 14 each, Refills: 0    pregabalin (LYRICA) 75 MG capsule Take 75 mg by mouth 2 (two) times daily.     Protein (  PROCEL) POWD Take 2 scoop by mouth 2 (two) times daily.    senna-docusate (SENOKOT-S) 8.6-50 MG tablet Take 1 tablet by mouth 2 (two) times daily. Qty: 14 tablet, Refills: 0    simvastatin (ZOCOR) 10 MG tablet Take 10 mg by mouth daily at 6 PM.     predniSONE (DELTASONE) 10 MG tablet Please take 3 tablets for 3 days, then 2 tablets for 3 days, then 1 tablet for 3 days, then stop      STOP taking these medications     tuberculin 5 UNIT/0.1ML injection        Allergies  Allergen Reactions  . Clopidogrel Bisulfate Hives    REACTION: red blue, black blotches  . Dilaudid [Hydromorphone Hcl] Hives and Other (See Comments)    Hallucinations, little green men working on the building  . Penicillins Hives    Has patient had a PCN reaction causing immediate rash, facial/tongue/throat swelling, SOB or lightheadedness with hypotension: Yes Has patient had a PCN reaction causing severe rash involving mucus membranes or skin necrosis: No Has patient had a PCN reaction that required  hospitalization No Has patient had a PCN reaction occurring within the last 10 years: No Tolerates cephalosporins   . Sulfa Antibiotics Other (See Comments)    Nightmares, hallucinations  . Doxycycline Hyclate Rash      The results of significant diagnostics from this hospitalization (including imaging, microbiology, ancillary and laboratory) are listed below for reference.    Significant Diagnostic Studies: Dg Chest 2 View  11/27/2015  CLINICAL DATA:  Dyspnea, cough and congestion. Low oxygen saturations despite nebulizer treatments. EXAM: CHEST  2 VIEW COMPARISON:  11/10/2015 FINDINGS: There is consolidation in the right lower lobe posteriorly, consistent with pneumonia but aspiration or hemorrhage could also produce this appearance. The left lung is clear except for unchanged linear basilar scarring or atelectasis. The pulmonary vasculature is normal. There are no pleural effusions. Hilar and mediastinal contours are unremarkable and unchanged. IMPRESSION: Right lower lobe consolidation posteriorly, likely pneumonia. Pulmonary hemorrhage could also produce this appearance. Electronically Signed   By: Andreas Newport M.D.   On: 11/27/2015 06:44   Dg Chest 2 View  11/09/2015  CLINICAL DATA:  Shortness of breath. EXAM: CHEST  2 VIEW COMPARISON:  10/28/2015 chest radiograph. FINDINGS: Surgical clips in the medial lower right neck. Stable cardiomediastinal silhouette with normal heart size. No pneumothorax. No pleural effusion. Patchy consolidation throughout both lower lobes. No pulmonary edema. IMPRESSION: Patchy bilateral lower lobe consolidation, most suggestive of multilobar pneumonia. Recommend follow-up PA and lateral post treatment chest radiographs in 4-6 weeks. Electronically Signed   By: Ilona Sorrel M.D.   On: 11/09/2015 20:38   Ct Chest Wo Contrast  11/10/2015  CLINICAL DATA:  Shortness of breath and cough EXAM: CT CHEST WITHOUT CONTRAST TECHNIQUE: Multidetector CT imaging of the  chest was performed following the standard protocol without IV contrast. COMPARISON:  11/09/2015 FINDINGS: The lungs are well aerated bilaterally. Patchy infiltrates are seen bilaterally worst in the right middle and right lower lobes consistent with acute pneumonia. Some scattered more nodular appearing infiltrates are seen as well. One of these nodular areas measures approximately 6 mm in the right upper lobe best seen on image number 30 of series 5. Second somewhat nodular area is noted on the left measuring 12 mm is in dimension. This is best seen on image number 27 of series 5. A small right-sided pleural effusion is seen. The thoracic inlet is within normal limits. Aortic calcifications  are noted. No significant hilar or mediastinal adenopathy is seen. Some calcification in the left ventricular wall is noted likely related to prior infarct. The visualized upper abdomen shows changes of prior cholecystectomy. A left renal cyst is noted as well. Degenerative changes of the thoracic spine are noted. IMPRESSION: Infiltrative changes in both lungs worst in the right middle and right lower lobes. Some nodularity is noted in scattered areas throughout both lungs. This all again likely represents an acute pneumonic process. Short-term followup following appropriate therapy with noncontrast CT of the chest is recommended to assess for resolution or stability. This could be performed in 4-6 weeks Electronically Signed   By: Inez Catalina M.D.   On: 11/10/2015 12:44   Dg Swallowing Func-speech Pathology  11/29/2015  Objective Swallowing Evaluation: Type of Study: MBS-Modified Barium Swallow Study Patient Details Name: EMILY KRELLER MRN: [id number] Date of Birth: 10-Apr-1924 Today's Date: 11/29/2015 Time: SLP Start Time (ACUTE ONLY): 1317-SLP Stop Time (ACUTE ONLY): 1356 SLP Time Calculation (min) (ACUTE ONLY): 39 min Past Medical History: Past Medical History Diagnosis Date . Carotid artery stenosis  . Essential hypertension   . Myocardial infarction ([place]) 1979 . Coronary artery disease    Known occluded proximal LAD with collaterals, moderate RCA disease - managed medically 2009 . COPD (chronic obstructive pulmonary disease) ([place])  . History of stroke  . Prostate cancer ([place])  . Arthritis  . Foot drop, right  . Neuropathy ([place])  . Anemia  . Urinary incontinence, male, stress  . GERD (gastroesophageal reflux disease)  . Thalassemia  . Wears dentures  . Deafness in left ear    Wears hearing aide in right ear . Sleep apnea    CPAP . Ischemic cardiomyopathy    LVEF 40-45% . Hypercholesteremia  . Anemia  . Osteoarthritis  . Stroke ([place])  . Heart attack ([place])  . Vertigo  . Hereditary and idiopathic peripheral neuropathy 03/01/2015 . Abnormality of gait 03/01/2015 Past Surgical History: Past Surgical History Procedure Laterality Date . Penile prosthesis implant   . Cholecystectomy   . Right carotid endarterectomy   . Back surgery   . Lumbar laminectomy  03/24/10   L3-4 and L4-5 . Ankle surgery     Bone infection . Cardiac catheterization   . Appendectomy   . Joint replacement Left  . Radioactive seed implant  2009 . Pars plana vitrectomy  03/19/2012   Procedure: PARS PLANA VITRECTOMY WITH 25 GAUGE;  Surgeon: Hayden Pedro, MD;  Location: [place];  Service: Ophthalmology;  Laterality: Right; . Carotid endarterectomy Right    CEA - 15 yrs ago . Spine surgery   . Eye surgery   . Cataract extraction w/ intraocular lens implant Left  . Endarterectomy Left 09/28/2014   Procedure: Left Carotid Endarterectomy;  Surgeon: Rosetta Posner, MD;  Location: [place];  Service: Vascular;  Laterality: Left; . Patch angioplasty Left 09/28/2014   Procedure: WITH DACRON PATCH ANGIOPLASTY;  Surgeon: Rosetta Posner, MD;  Location: [facility] OR;  Service: Vascular;  Laterality: Left; HPI: 82 yo male with history of systolic HF, HTN, CKDIII, COPD, DM II, stroke GERD, deaf in left ear and peripheral neuropathy who was brought here from rehab due to dyspnea and hypoxia. Per chart recent  admission for for acute respiratory failure due to COPD exacerbation / HCAP (11/09/15). CXR right lower lobe consolidation posteriorly, likely pneumonia. Pulmonary hemorrhage could also produce this appearance. No previous ST notes found in EPIC.  MD documented pt with difficulty managing secretions  and he assisted pt with oral suctioning.    CXR showed right lower lobe consolidation - posterior concerning for pna.   Subjective: pt awake in chair Assessment / Plan / Recommendation CHL IP CLINICAL IMPRESSIONS 11/29/2015 Therapy Diagnosis Moderate oral phase dysphagia;Severe pharyngeal phase dysphagia Clinical Impression Moderate oral and severe pharyngeal dysphagia characterized mostly by gross weakness.   Pt with very poor muscle contraction resulting in severe residuals with pt only clearing approximately 20% into esophagus.  Remainder was retained in pharynx and required cued expectoration to clear.  Trace laryngeal penetration/aspiration of liquids noted without cough response- with every liquid swallow. Pt with open larynx throughout entire swallow despite chin tuck posture.  Penetrates were moved to to upper larynx with cued throat clear but would consistently re-penetrate.  Of note, pt did cough x1 during testing - not associated with aspiration. Various techniques including effortful swallow, following solids with liquids not helpful.   Fortunately, pt able to cough/"hock" and expectorate residuals with cueing.  He is however grossly weak and just participating in this MBS was exhausting for him and pt only consumed approximately six boluses.   Pt states his swallowing ability is as good as normal and he admits to dysphagia x one year.  Pt's level of dysphagia does not allow him to meet nutritional needs   At this time based on this test and pt report, pt will aspirate intake and secretions.  Concern is present for swallowing prognosis given his report of chronic deficits.  If pt is to continue po, it should be  with known risks and for comfort.  Will follow up for family education and to determine if any assistance/treatment indicated/helpful.  Educated pt to findings/recommendations live using monitor.    Impact on safety and function --   CHL IP TREATMENT RECOMMENDATION 11/27/2015 Treatment Recommendations Therapy as outlined in treatment plan below   Prognosis 11/29/2015 Prognosis for Safe Diet Advancement Guarded Barriers to Reach Goals -- Barriers/Prognosis Comment -- CHL IP DIET RECOMMENDATION 11/29/2015 SLP Diet Recommendations NPO Liquid Administration via -- Medication Administration Via alternative means Compensations Slow rate;Small sips/bites;Other (Comment) Postural Changes --       CHL IP FREQUENCY AND DURATION 11/29/2015 Speech Therapy Frequency (ACUTE ONLY) min 2x/week Treatment Duration 1 week      CHL IP ORAL PHASE 11/29/2015 Oral Phase -- Oral - Pudding Teaspoon -- Oral - Pudding Cup -- Oral - Honey Teaspoon -- Oral - Honey Cup -- Oral - Nectar Teaspoon -- Oral - Nectar Cup -- Oral - Nectar Straw Weak lingual manipulation;Delayed oral transit Oral - Thin Teaspoon Weak lingual manipulation;Delayed oral transit Oral - Thin Cup -- Oral - Thin Straw Weak lingual manipulation;Delayed oral transit Oral - Puree Weak lingual manipulation;Delayed oral transit Oral - Mech Soft -- Oral - Regular Weak lingual manipulation;Delayed oral transit Oral - Multi-Consistency -- Oral - Pill -- Oral Phase - Comment --  CHL IP PHARYNGEAL PHASE 11/29/2015 Pharyngeal Phase Impaired Pharyngeal- Pudding Teaspoon -- Pharyngeal -- Pharyngeal- Pudding Cup -- Pharyngeal -- Pharyngeal- Honey Teaspoon -- Pharyngeal -- Pharyngeal- Honey Cup -- Pharyngeal -- Pharyngeal- Nectar Teaspoon Reduced pharyngeal peristalsis;Reduced epiglottic inversion;Reduced laryngeal elevation;Reduced airway/laryngeal closure;Reduced tongue base retraction;Pharyngeal residue - valleculae;Pharyngeal residue - pyriform Pharyngeal -- Pharyngeal- Nectar Cup --  Pharyngeal -- Pharyngeal- Nectar Straw Reduced pharyngeal peristalsis;Reduced epiglottic inversion;Reduced airway/laryngeal closure;Reduced laryngeal elevation;Reduced tongue base retraction;Penetration/Apiration after swallow;Pharyngeal residue - valleculae;Pharyngeal residue - pyriform Pharyngeal Material enters airway, passes BELOW cords without attempt by patient to eject out (silent aspiration);Material enters  airway, remains ABOVE vocal cords and not ejected out;Material enters airway, CONTACTS cords and not ejected out Pharyngeal- Thin Teaspoon Reduced pharyngeal peristalsis;Reduced epiglottic inversion;Reduced tongue base retraction;Reduced airway/laryngeal closure;Reduced laryngeal elevation;Penetration/Aspiration during swallow;Pharyngeal residue - valleculae;Pharyngeal residue - pyriform Pharyngeal Material enters airway, remains ABOVE vocal cords and not ejected out Pharyngeal- Thin Cup -- Pharyngeal -- Pharyngeal- Thin Straw Delayed swallow initiation-pyriform sinuses;Reduced pharyngeal peristalsis;Penetration/Aspiration before swallow;Reduced epiglottic inversion Pharyngeal -- Pharyngeal- Puree Reduced pharyngeal peristalsis;Reduced epiglottic inversion;Reduced anterior laryngeal mobility;Reduced laryngeal elevation;Reduced airway/laryngeal closure;Reduced tongue base retraction;Pharyngeal residue - valleculae Pharyngeal -- Pharyngeal- Mechanical Soft -- Pharyngeal -- Pharyngeal- Regular Reduced pharyngeal peristalsis;Reduced epiglottic inversion;Reduced tongue base retraction;Pharyngeal residue - valleculae Pharyngeal -- Pharyngeal- Multi-consistency -- Pharyngeal -- Pharyngeal- Pill -- Pharyngeal -- Pharyngeal Comment chin tuck, cued cough, cued dry swallow, cued expectoration effective, dry swallows weak  Luanna Salk, MS [facility] SLP [phone]    Microbiology: Recent Results (from the past 240 hour(s))  Blood Culture (routine x 2)     Status: None (Preliminary result)   Collection Time: 11/27/15   6:50 AM  Result Value Ref Range Status   Specimen Description BLOOD RIGHT HAND  5 ML IN [facility] BOTTLE  Final   Special Requests NONE  Final   Culture   Final    NO GROWTH 2 DAYS Performed at [facility]    Report Status PENDING  Incomplete  Blood Culture (routine x 2)     Status: None (Preliminary result)   Collection Time: 11/27/15  8:25 AM  Result Value Ref Range Status   Specimen Description BLOOD LEFT WRIST  5 ML IN [facility] BOTTLE  Final   Special Requests NONE  Final   Culture   Final    NO GROWTH 2 DAYS Performed at [facility]    Report Status PENDING  Incomplete  Culture, sputum-assessment     Status: None   Collection Time: 11/27/15 11:30 AM  Result Value Ref Range Status   Specimen Description SPUTUM  Final   Special Requests NONE  Final   Sputum evaluation THIS SPECIMEN IS ACCEPTABLE FOR SPUTUM CULTURE  Final   Report Status 11/27/2015 FINAL  Final  MRSA PCR Screening     Status: Abnormal   Collection Time: 11/27/15 11:30 AM  Result Value Ref Range Status   MRSA by PCR POSITIVE (A) NEGATIVE Final    Comment:        The GeneXpert MRSA Assay (FDA approved for NASAL specimens only), is one component of a comprehensive MRSA colonization surveillance program. It is not intended to diagnose MRSA infection nor to guide or monitor treatment for MRSA infections. RESULT CALLED TO, READ BACK BY AND VERIFIED WITH: E.MACABUEAG RN AT [id number] ON 11/27/15 BY S.VANHOORNE   Culture, respiratory (NON-Expectorated)     Status: None (Preliminary result)   Collection Time: 11/27/15 11:30 AM  Result Value Ref Range Status   Specimen Description SPUTUM  Final   Special Requests NONE  Final   Gram Stain   Final    FEW WBC PRESENT,BOTH PMN AND MONONUCLEAR FEW SQUAMOUS EPITHELIAL CELLS PRESENT MODERATE GRAM POSITIVE COCCI IN CLUSTERS FEW GRAM POSITIVE RODS FEW GRAM NEGATIVE RODS Performed at [facility]    Culture   Final    ABUNDANT STAPHYLOCOCCUS  AUREUS Note: RIFAMPIN AND GENTAMICIN SHOULD NOT BE USED AS SINGLE DRUGS FOR TREATMENT OF STAPH INFECTIONS. Performed at [facility]    Report Status PENDING  Incomplete     Labs: Basic Metabolic Panel:  Recent Labs  Lab 11/27/15 0648 11/28/15 0405 11/30/15 0328  NA 141 141 141  K 3.9 3.7 3.3*  CL 102 103 98*  CO2 30 29 36*  GLUCOSE 158* 157* 166*  BUN 22* 24* 29*  CREATININE 1.04 1.05 1.24  CALCIUM 8.7* 8.1* 8.4*   Liver Function Tests:  Recent Labs Lab 11/28/15 0405  AST 10*  ALT 11*  ALKPHOS 56  BILITOT 0.7  PROT 5.8*  ALBUMIN 2.1*   No results for input(s): LIPASE, AMYLASE in the last 168 hours. No results for input(s): AMMONIA in the last 168 hours. CBC:  Recent Labs Lab 11/27/15 0648 11/28/15 0405 11/30/15 0328  WBC 6.9 4.4 6.5  NEUTROABS 5.7 3.8  --   HGB 8.9* 8.6* 9.0*  HCT 28.6* 28.3* 29.5*  MCV 62.7* 62.6* 62.8*  PLT 170 153 190   Cardiac Enzymes: No results for input(s): CKTOTAL, CKMB, CKMBINDEX, TROPONINI in the last 168 hours. BNP: BNP (last 3 results)  Recent Labs  09/20/15 1833 10/26/15 0245 11/09/15 1923  BNP 196.1* 631.1* 423.8*    ProBNP (last 3 results) No results for input(s): PROBNP in the last 8760 hours.  CBG:  Recent Labs Lab 11/29/15 1150 11/29/15 1718 11/29/15 2152 11/30/15 0736 11/30/15 1136  GLUCAP 196* 105* 235* 109* 159*       Signed:  Verlee Monte A MD.  [facility] 11/30/2015, 12:02 PM

## 2015-11-30 NOTE — Evaluation (Signed)
Physical Therapy Evaluation Patient Details Name: [name] MRN: [id number] DOB: [birth year] Today's Date: 11/30/2015   History of Present Illness  Patient is a 82 yo male with history of systolic HF, HTN, CKDIII, COPD, DM II, and peripheral neuropathy who was brought here from rehab due to dyspnea and hypoxia. He was recently admitted 3/28-4/3 for acute respiratory failure due to COPD exacerbation / HCAP and discharged to rehab on oxygen via [place] 2L. Pt states he was beginning to work with PT and OT at [facility]  and was sitting in [facility] and transferring, and short ambulation with RW as well.   Clinical Impression  Pt presents with some dyspnea with movement, generalized weakness, and medical issues limiting mobility at the moment. Assessment today limited to edge of bed and supine exercises due to decrease in BPs with symptoms when sitting for less than 1 minute. Pt very pleasant and has good movement, just getting strength and activity tolerance back with be a big part of rehab. Recommend continued work with PT post acute to continue to increase strength and activity tolerance for bed mobility, transfers and ambulation.     Follow Up Recommendations SNF    Equipment Recommendations  None recommended by PT    Recommendations for Other Services       Precautions / Restrictions Precautions Precautions: Fall Precaution Comments: bil LE AFO shoes (not seen in room this admission)   HOH Restrictions Weight Bearing Restrictions: No      Mobility  Bed Mobility Overal bed mobility: Needs Assistance Bed Mobility: Supine to Sit;Sit to Supine     Supine to sit: Min assist Sit to supine: Mod assist (due to BP dropping when sitting EOB and pt required assist to get to supine. )      Transfers                 General transfer comment: unable to perform sit to stand due to BP dropping when sitting EOB and pt was becoming less alert adn needing more assistance for sitting EOB as time  elapsed due to BPs dropping. was able to squat scoot to EOB before helping to supine.   Ambulation/Gait         Gait velocity: decreased      Stairs            Wheelchair Mobility    Modified Rankin (Stroke Patients Only)       Balance Overall balance assessment: Needs assistance Sitting-balance support: Bilateral upper extremity supported Sitting balance-Leahy Scale: Good (however became less balnced due to drop in BP sitting EOB. )                                       Pertinent Vitals/Pain Pain Assessment: No/denies pain    Home Living Family/patient expects to be discharged to:: Skilled nursing facility (was at [facility] with recetn DC from hospital for rehab) Living Arrangements: Children Available Help at Discharge: Personal care attendant Type of Home: House Home Access: Stairs to enter   [employer] of Steps: 3 Home Layout: One level Home Equipment: [place] - 2 wheels;Walker - 4 wheels      Prior Function Level of Independence: Needs assistance   Gait / Transfers Assistance Needed: pt was at a different rehab facility of which he stated they did not walk with him muhc, however at [facility] just recently (prior to this admission)  they were beginning to work on exercises and walking           Hand Dominance   Dominant Hand: Right    Extremity/Trunk Assessment               Lower Extremity Assessment: Generalized weakness RLE Deficits / Details: able to DF on L and RLE , however L foot unable to come to neutral, however R foot was able to come to neutral in supine position. Noted flexed toes, but able to PROM to extend so no tightness yet in toe extenison.  LLE Deficits / Details: able to DF on L and RLE , however L foot unable to come to neutral, however R foot was able to come to neutral in supine position. Noted flexed toes, but able to PROM to extend so no tightness yet in toe extenison.      Communication    Communication: HOH (hears best out of Right ear)  Cognition Arousal/Alertness: Awake/alert Behavior During Therapy: WFL for tasks assessed/performed Overall Cognitive Status: Within Functional Limits for tasks assessed                      General Comments      Exercises Other Exercises Other Exercises: performed supine exercises after returning to supine from sitting . 10x each BLE for the following: heels slides, SLR, hip abduction, and arm extesnions over head 10xx BUEs. Tolerated exercies well.       Assessment/Plan    PT Assessment Patient needs continued PT services  PT Diagnosis Difficulty walking;Generalized weakness   PT Problem List Decreased strength;Decreased activity tolerance;Decreased balance;Decreased mobility;Decreased knowledge of use of DME  PT Treatment Interventions DME instruction;Gait training;Functional mobility training;Therapeutic activities;Therapeutic exercise;Patient/family education   PT Goals (Current goals can be found in the Care Plan section) Acute Rehab PT Goals Patient Stated Goal: I want to try  PT Goal Formulation: With patient Time For Goal Achievement: 12/14/15 Potential to Achieve Goals: Good    Frequency Min 3X/week   Barriers to discharge        Co-evaluation               End of Session Equipment Utilized During Treatment: Oxygen (3 L ) Activity Tolerance: Treatment limited secondary to medical complications (Comment) Patient left: in bed;with call bell/phone within reach;with family/visitor present Nurse Communication: Mobility status         Time: 1445-1510 PT Time Calculation (min) (ACUTE ONLY): 25 min   Charges:   PT Evaluation $PT Eval Moderate Complexity: 1 Procedure PT Treatments $Therapeutic Activity: 8-22 mins   PT G CodesClide Dales 12-02-15, 4:06 PM  Clide Dales, PT Pager: [phone] Dec 02, 2015

## 2015-11-30 NOTE — Progress Notes (Signed)
PHARMACIST - PHYSICIAN COMMUNICATION CONCERNING: Antibiotic IV to Oral Route Change Policy  RECOMMENDATION: This patient is receiving Azithromycin by the intravenous route.  Based on criteria approved by the Pharmacy and Therapeutics Committee, the antibiotic(s) is/are being converted to the equivalent oral dose form(s).   DESCRIPTION: These criteria include:  Patient being treated for a respiratory tract infection, urinary tract infection, cellulitis or clostridium difficile associated diarrhea if on metronidazole  The patient is not neutropenic and does not exhibit a GI malabsorption state  The patient is eating (either orally or via tube) and/or has been taking other orally administered medications for a least 24 hours  The patient is improving clinically and has a Tmax < 100.5  If you have questions about this conversion, please contact the Pharmacy Department  []  ( [phone] )  [place] []  ( [phone] )  [facility] []  ( [phone]  [place] []  ( [phone] )  [facility] [x]  ( [phone] )  [facility]   

## 2015-11-30 NOTE — Care Management Important Message (Signed)
Important Message  Patient Details IM Letter given to Nora/Case Manager to present to Patient Name: HAMED FINKENBINDER MRN: [id number] Date of Birth: [birth year]   Medicare Important Message Given:  Yes    Camillo Flaming 11/30/2015, 9:18 AMImportant Message  Patient Details  Name: [name] MRN: [id number] Date of Birth: 1924-03-13   Medicare Important Message Given:  Yes    Camillo Flaming 11/30/2015, 9:18 AM

## 2015-11-30 NOTE — Progress Notes (Signed)
CSW continuing to follow.   PT notes were sent to [facility]. CSW spoke to [facility] case manager who stated that pt case was sent to medical director due to pt being a readmission to SNF and pt pressure dropping with PT. As of 6:15 pm, no determination made from [facility] regarding authorization. Insurance may need peer to peer with MD, but awaiting further clarification.   CSW updated pt daughter at bedside, RN, MD, and [employer].   CSW to follow up with pt insurance company tomorrow regarding status of authorization.   Alison Murray, MSW, [place] [employer] [phone]

## 2015-11-30 NOTE — NC FL2 (Signed)
[place] LEVEL OF CARE SCREENING TOOL     IDENTIFICATION  Patient Name: [name] Birthdate: 09/28/1923 Sex: male Admission Date (Current Location): 11/27/2015  [facility] and [place] Number:  Herbalist and Address:  [facility],  [place] [address], [place]      Provider Number: [id number]  Attending Physician Name and Address:  Verlee Monte, MD  Relative Name and Phone Number:       Current Level of Care: Hospital Recommended Level of Care: [place] Prior Approval Number:    Date Approved/Denied:   PASRR Number: [id number]  Discharge Plan: SNF    Current Diagnoses: Patient Active Problem List   Diagnosis Date Noted  . Sacral ulcer 11/29/2015  . E coli bacteremia 11/11/2015  . Pressure ulcer 11/10/2015  . GERD (gastroesophageal reflux disease) 11/10/2015  . Healthcare-associated pneumonia 11/09/2015  . Hypertensive heart disease with CHF (congestive heart failure) ([place]) 11/05/2015  . Obstructive sleep apnea 11/05/2015  . Acute respiratory failure with hypoxia ([place]) 10/29/2015  . HCAP (healthcare-associated pneumonia) 10/24/2015  . AKI (acute kidney injury) ([place]) 10/24/2015  . Ileus ([place]) 10/19/2015  . Stage III chronic kidney disease 10/19/2015  . Anemia, iron deficiency 10/19/2015  . COPD (chronic obstructive pulmonary disease) ([place]) 10/19/2015  . Essential hypertension 10/19/2015  . SBO (small bowel obstruction) ([place]) 10/15/2015  . Diarrhea 10/15/2015  . Near syncope 09/20/2015  . Acute kidney injury ([place]) 09/20/2015  . Syncope 09/20/2015  . Hereditary and idiopathic peripheral neuropathy 03/01/2015  . Abnormality of gait 03/01/2015  . Acute on chronic combined systolic and diastolic congestive heart failure, NYHA class 3 ([place])   . Acute pulmonary edema (HCC)   . Pneumonia   . E-coli UTI   . Acute respiratory failure ([place]) 01/08/2015  . Carotid stenosis 09/28/2014  . Aftercare following surgery  of the circulatory system, [place] 01/27/2014  . Chronic systolic CHF (congestive heart failure) ([place]) 11/17/2013  . Cough 11/13/2013  . Microcytic anemia 10/31/2013  . COPD GOLD III 10/30/2013  . Macular pucker 02/27/2012  . EDEMA 01/01/2009  . CAD, NATIVE VESSEL 11/06/2008    Orientation RESPIRATION BLADDER Height & Weight     Self, Time, Situation, Place  O2 (4 L/min) External catheter Weight: 158 lb 4.8 oz (71.804 kg) Height:  6' (182.9 cm)  BEHAVIORAL SYMPTOMS/MOOD NEUROLOGICAL BOWEL NUTRITION STATUS  Other (Comment) (NONE)  (None) Continent Diet (Diet Heart Room service appropriate; Fluid consistency: Thin)  AMBULATORY STATUS COMMUNICATION OF NEEDS Skin   Extensive Assist Verbally PU Stage and Appropriate Care   PU Stage 2 Dressing:  (PRN; Foam)                   Personal Care Assistance Level of Assistance  Bathing, Feeding, Dressing Bathing Assistance: Limited assistance Feeding assistance: Independent Dressing Assistance: Limited assistance     Functional Limitations Info  Sight, Hearing, Speech Sight Info: Impaired Hearing Info: Impaired Speech Info: Adequate    SPECIAL CARE FACTORS FREQUENCY                       Contractures Contractures Info: Not present    Additional Factors Info  Code Status, Allergies Code Status Info: FULL Code Allergies Info: Clopidogrel Bisulfate, Dilaudid, Peniciillins, Sulfa Antibiotics, Sulfonamide Derivatives, Doxycycline Hyclate           Current Medications (11/30/2015):  This is the current hospital active medication list Current Facility-Administered Medications  Medication Dose Route Frequency  Provider Last Rate Last Dose  . acetaminophen (TYLENOL) tablet 650 mg  650 mg Oral Q6H PRN Gennaro Africa, MD      . acidophilus (RISAQUAD) capsule 2 capsule  2 capsule Oral Daily Gennaro Africa, MD   2 capsule at 11/29/15 1036  . aspirin EC tablet 81 mg  81 mg Oral Daily Gennaro Africa, MD   81 mg at 11/29/15 1037  . azithromycin  (ZITHROMAX) 500 mg in dextrose 5 % 250 mL IVPB  500 mg Intravenous Q24H Okey Regal, PA-C   500 mg at 11/29/15 0818  . carvedilol (COREG) tablet 12.5 mg  12.5 mg Oral BID WC Verlee Monte, MD      . Chlorhexidine Gluconate Cloth 2 % PADS 6 each  6 each Topical Q0600 Gennaro Africa, MD   6 each at 11/28/15 1728  . enoxaparin (LOVENOX) injection 40 mg  40 mg Subcutaneous Daily Gennaro Africa, MD   40 mg at 11/29/15 1036  . ferrous sulfate tablet 325 mg  325 mg Oral BID WC Gennaro Africa, MD   325 mg at 11/29/15 1755  . furosemide (LASIX) injection 40 mg  40 mg Intravenous BID Verlee Monte, MD   40 mg at 11/29/15 1745  . guaiFENesin (ROBITUSSIN) 100 MG/5ML solution 200 mg  10 mL Oral Q4H PRN Gennaro Africa, MD   200 mg at 11/27/15 1325  . insulin aspart (novoLOG) injection 0-9 Units  0-9 Units Subcutaneous TID WC Gennaro Africa, MD   2 Units at 11/29/15 1229  . insulin glargine (LANTUS) injection 5 Units  5 Units Subcutaneous Daily Gennaro Africa, MD   5 Units at 11/29/15 1038  . ipratropium-albuterol (DUONEB) 0.5-2.5 (3) MG/3ML nebulizer solution 3 mL  3 mL Nebulization Q4H PRN Gennaro Africa, MD   3 mL at 11/27/15 1153  . linaclotide (LINZESS) capsule 145 mcg  145 mcg Oral Q breakfast Gennaro Africa, MD   145 mcg at 11/29/15 [phone]  . losartan (COZAAR) tablet 25 mg  25 mg Oral Daily Gennaro Africa, MD   25 mg at 11/29/15 1038  . magic mouthwash  5 mL Oral QID Gennaro Africa, MD   5 mL at 11/29/15 1755  . meclizine (ANTIVERT) tablet 25 mg  25 mg Oral Daily PRN Gennaro Africa, MD      . mirabegron ER [facility]) tablet 50 mg  50 mg Oral Daily Gennaro Africa, MD   50 mg at 11/29/15 1036  . mometasone-formoterol (DULERA) 200-5 MCG/ACT inhaler 2 puff  2 puff Inhalation BID Gennaro Africa, MD   2 puff at 11/29/15 2058  . mupirocin ointment (BACTROBAN) 2 % 1 application  1 application Nasal BID Gennaro Africa, MD   1 application at [phone]  . nitroGLYCERIN (NITROSTAT) SL tablet 0.4 mg  0.4 mg Sublingual Q5 min PRN Gennaro Africa, MD      .  pantoprazole (PROTONIX) EC tablet 40 mg  40 mg Oral Daily Gennaro Africa, MD   40 mg at 11/29/15 1036  . polyethylene glycol (MIRALAX / GLYCOLAX) packet 17 g  17 g Oral BID Gennaro Africa, MD   17 g at 11/29/15 2136  . pregabalin (LYRICA) capsule 75 mg  75 mg Oral BID Gennaro Africa, MD   75 mg at 11/29/15 2135  . protein supplement (RESOURCE BENEPROTEIN) powder packet 12 g  2 packet Oral BID Gennaro Africa, MD   12 g at 11/29/15 2135  . senna-docusate (Senokot-S) tablet 1 tablet  1 tablet Oral BID Gennaro Africa, MD   1  tablet at 11/29/15 2135  . simvastatin (ZOCOR) tablet 10 mg  10 mg Oral q1800 Gennaro Africa, MD   10 mg at 11/29/15 1753     Discharge Medications: Please see discharge summary for a list of discharge medications.  Relevant Imaging Results:  Relevant Lab Results:   Additional Information SSN: [id number]  Elsey Holts, [place] [facility], LCSW

## 2015-11-30 NOTE — Progress Notes (Signed)
CSW following for pt discharge to [facility].  CSW spoke with [facility] and confirmed that pt can return however, [place] states that pt is insurance is [employer] and authorization required.   Our record indicated pt primary insurance is Medicare.   CSW asked MD for PT consult in order to obtain [facility] authorization.   CSW notified PT of stat need for PT eval.   CSW to fax information to [facility] once PT eval available.   CSW to discharge pt back to [facility] when [facility] auth received.   Alison Murray, MSW, [place] Work [phone]

## 2015-12-01 ENCOUNTER — Other Ambulatory Visit: Payer: Self-pay | Admitting: *Deleted

## 2015-12-01 LAB — BASIC METABOLIC PANEL
Anion gap: 8 (ref 5–15)
BUN: 26 mg/dL — AB (ref 6–20)
CALCIUM: 8.8 mg/dL — AB (ref 8.9–10.3)
CO2: 38 mmol/L — AB (ref 22–32)
CREATININE: 1.33 mg/dL — AB (ref 0.61–1.24)
Chloride: 99 mmol/L — ABNORMAL LOW (ref 101–111)
GFR calc Af Amer: 53 mL/min — ABNORMAL LOW (ref >60)
GFR, EST NON AFRICAN AMERICAN: 46 mL/min — AB (ref >60)
GLUCOSE: 88 mg/dL (ref 65–99)
Potassium: 3.9 mmol/L (ref 3.5–5.1)
Sodium: 145 mmol/L (ref 135–145)

## 2015-12-01 LAB — GLUCOSE, CAPILLARY
GLUCOSE-CAPILLARY: 89 mg/dL (ref 65–99)
GLUCOSE-CAPILLARY: 105 mg/dL — AB (ref 65–99)
Glucose-Capillary: 71 mg/dL (ref 65–99)
Glucose-Capillary: 90 mg/dL (ref 65–99)
Glucose-Capillary: 81 mg/dL (ref 65–99)
Glucose-Capillary: 93 mg/dL (ref 65–99)

## 2015-12-01 LAB — CULTURE, RESPIRATORY

## 2015-12-01 MED ORDER — DEXTROSE 50 % IV SOLN
1.0000 | Freq: Once | INTRAVENOUS | Status: DC
  Filled 2015-12-01: qty 50

## 2015-12-01 MED ORDER — LEVOFLOXACIN 500 MG PO TABS: 500.0000 mg | ORAL_TABLET | Freq: Every day | ORAL | Status: DC

## 2015-12-01 MED ORDER — CEFPODOXIME PROXETIL 200 MG PO TABS: 200.0000 mg | ORAL_TABLET | Freq: Two times a day (BID) | ORAL | Status: DC

## 2015-12-01 MED ORDER — VANCOMYCIN HCL IN DEXTROSE 1-5 GM/200ML-% IV SOLN
1000.0000 mg | INTRAVENOUS | Status: DC
  Administered 2015-12-01: 1000 mg via INTRAVENOUS
  Filled 2015-12-01 (×2): qty 200

## 2015-12-01 MED ORDER — PREGABALIN 75 MG PO CAPS: 75.0000 mg | ORAL_CAPSULE | Freq: Two times a day (BID) | ORAL | Status: DC

## 2015-12-01 NOTE — Progress Notes (Addendum)
[facility] PROGRESS NOTE   [name] [id number] DOB: [birth year] DOA: 11/27/2015 PCP:  Melinda Crutch, MD  HPI/Subjective: Per RN very Poor PO intake, c/o dyspnea and cough  Assessment/Plan:  Recurrent Aspiration pneumonia -has underlying COPD too -was on HCAP coverage with vancomycin, cefepime and azithromycin, - Abx changed to Azithromycin alone yesterday, will add Vanc-sputum Cx with MRSA and multiple allergies noted -He was recently treated for Escherichia coli pneumonia, likely aspiration related, this is 4th admission since March for similar issues -clinically has ongoing aspiration -s/p MBS and SLP eval: severe pharyngeal Dysphagia, severe residuals with pt only clearing approximately 20% into esophagus, remainder was retained in pharynx, recommended Po only for comfort understanding aspiration risks -I called Daughter [name] and recommended consideration of DNR and Palliative meeting -She is agreeable to meet with Palliative care but wants more discussion about resuscitation in person, i emphasized that he will continue to Aspirate and decline further at any time  Systolic CHF: Echo in March 2017 with EF of 40-45% No vascular congestin in CXR -hold cozaar, resume lasix when BP tolerates -fluctauting  Anemia : Microcytic : iron deficiency and chronic disease, At baseline Colonoscopy in 2005 was with benign polyps Continue iron supplements   HTN: continue home meds  DMII: -CBG sin 70-90 range, will stop lantus, po intake Poor - SS low correction  Stage II pressure ulcer: Wound care.  DVt proph: Lovenox  Code Status: Full Code Family Communication: called and d/w Daughter CIndy Disposition Plan: SNF , ? Hospice, pending Palliative meeting Diet: Diet Heart Room service appropriate?: Yes; Fluid consistency:: Thin  Consultants:  PCCM  Procedures:  None  Antibiotics:  Vancomycin, cefepime and azithromycin (indicate start date, and stop date if  known)   Objective: Filed Vitals:   11/30/15 2100 12/01/15 0657  BP: 126/45 128/50  Pulse:  80  Temp: 98 F (36.7 C) 98.5 F (36.9 C)  Resp: 20 24    Intake/Output Summary (Last 24 hours) at 12/01/15 1029 Last data filed at 12/01/15 0700  Gross per 24 hour  Intake      0 ml  Output   1150 ml  Net  -1150 ml   Filed Weights   11/27/15 1040  Weight: 71.804 kg (158 lb 4.8 oz)    Exam: General: Alert and awake, oriented x to self and place, no distress. HEENT: anicteric sclera, pupils reactive to light and accommodation, EOMI CVS: S1-S2 clear, no murmur rubs or gallops Chest: basilar ronchi B/L Abdomen: soft nontender, nondistended, normal bowel sounds, no organomegaly Extremities: no cyanosis, clubbing or edema noted bilaterally Neuro: Cranial nerves II-XII intact, no focal neurological deficits  Data Reviewed: Basic Metabolic Panel:  Recent Labs Lab 11/27/15 0648 11/28/15 0405 11/30/15 0328 12/01/15 0324  NA 141 141 141 145  K 3.9 3.7 3.3* 3.9  CL 102 103 98* 99*  CO2 30 29 36* 38*  GLUCOSE 158* 157* 166* 88  BUN 22* 24* 29* 26*  CREATININE 1.04 1.05 1.24 1.33*  CALCIUM 8.7* 8.1* 8.4* 8.8*   Liver Function Tests:  Recent Labs Lab 11/28/15 0405  AST 10*  ALT 11*  ALKPHOS 56  BILITOT 0.7  PROT 5.8*  ALBUMIN 2.1*   No results for input(s): LIPASE, AMYLASE in the last 168 hours. No results for input(s): AMMONIA in the last 168 hours. CBC:  Recent Labs Lab 11/27/15 0648 11/28/15 0405 11/30/15 0328  WBC 6.9 4.4 6.5  NEUTROABS 5.7 3.8  --   HGB 8.9* 8.6* 9.0*  HCT 28.6* 28.3* 29.5*  MCV 62.7* 62.6* 62.8*  PLT 170 153 190   Cardiac Enzymes: No results for input(s): CKTOTAL, CKMB, CKMBINDEX, TROPONINI in the last 168 hours. BNP (last 3 results)  Recent Labs  09/20/15 1833 10/26/15 0245 11/09/15 1923  BNP 196.1* 631.1* 423.8*    ProBNP (last 3 results) No results for input(s): PROBNP in the last 8760 hours.  CBG:  Recent Labs Lab  11/30/15 1655 11/30/15 2206 12/01/15 0257 12/01/15 0417 12/01/15 0937  GLUCAP 97 84 71 90 81    Micro Recent Results (from the past 240 hour(s))  Blood Culture (routine x 2)     Status: None (Preliminary result)   Collection Time: 11/27/15  6:50 AM  Result Value Ref Range Status   Specimen Description BLOOD RIGHT HAND  5 ML IN [facility] BOTTLE  Final   Special Requests NONE  Final   Culture   Final    NO GROWTH 3 DAYS Performed at [facility]    Report Status PENDING  Incomplete  Blood Culture (routine x 2)     Status: None (Preliminary result)   Collection Time: 11/27/15  8:25 AM  Result Value Ref Range Status   Specimen Description BLOOD LEFT WRIST  5 ML IN [facility] BOTTLE  Final   Special Requests NONE  Final   Culture   Final    NO GROWTH 3 DAYS Performed at [facility]    Report Status PENDING  Incomplete  Influenza virus ag, a+b (DFA)     Status: None   Collection Time: 11/27/15 10:44 AM  Result Value Ref Range Status   Influenza Virus A and B Ag REPORT  Corrected    Comment: (NOTE) Influenza Virus Type A#B Antigen, DFA SOURCE : NOT SUPPLIED Influenza Type A Ag, DFA        Negative for influenza virus Influenza Type B Ag, DFA        Negative for influenza virus For maximum sensitivity in the diagnosis of viral infections, a negative or suspected positive result should be confirmed by viral culture. Performed at [place] 04/18 AT 1802: PREVIOUSLY REPORTED AS REPORT   Culture, sputum-assessment     Status: None   Collection Time: 11/27/15 11:30 AM  Result Value Ref Range Status   Specimen Description SPUTUM  Final   Special Requests NONE  Final   Sputum evaluation THIS SPECIMEN IS ACCEPTABLE FOR SPUTUM CULTURE  Final   Report Status 11/27/2015 FINAL  Final  MRSA PCR Screening     Status: Abnormal   Collection Time: 11/27/15 11:30 AM  Result Value Ref Range Status   MRSA by PCR POSITIVE (A) NEGATIVE Final    Comment:         The GeneXpert MRSA Assay (FDA approved for NASAL specimens only), is one component of a comprehensive MRSA colonization surveillance program. It is not intended to diagnose MRSA infection nor to guide or monitor treatment for MRSA infections. RESULT CALLED TO, READ BACK BY AND VERIFIED WITH: E.MACABUEAG RN AT [id number] ON 11/27/15 BY S.VANHOORNE   Culture, respiratory (NON-Expectorated)     Status: None (Preliminary result)   Collection Time: 11/27/15 11:30 AM  Result Value Ref Range Status   Specimen Description SPUTUM  Final   Special Requests NONE  Final   Gram Stain   Final    FEW WBC PRESENT,BOTH PMN AND MONONUCLEAR FEW SQUAMOUS EPITHELIAL CELLS PRESENT MODERATE GRAM POSITIVE COCCI IN CLUSTERS FEW GRAM POSITIVE RODS FEW  GRAM NEGATIVE RODS Performed at [facility]    Culture   Final    ABUNDANT STAPHYLOCOCCUS AUREUS Note: RIFAMPIN AND GENTAMICIN SHOULD NOT BE USED AS SINGLE DRUGS FOR TREATMENT OF STAPH INFECTIONS. Performed at [facility]    Report Status PENDING  Incomplete     Studies: Dg Swallowing Func-speech Pathology  11/29/2015  Objective Swallowing Evaluation: Type of Study: MBS-Modified Barium Swallow Study Patient Details Name: ZACKORY CZARNOWSKI MRN: [id number] Date of Birth: Jan 08, 1924 Today's Date: 11/29/2015 Time: SLP Start Time (ACUTE ONLY): 1317-SLP Stop Time (ACUTE ONLY): 1356 SLP Time Calculation (min) (ACUTE ONLY): 39 min Past Medical History: Past Medical History Diagnosis Date . Carotid artery stenosis  . Essential hypertension  . Myocardial infarction ([place]) 1979 . Coronary artery disease    Known occluded proximal LAD with collaterals, moderate RCA disease - managed medically 2009 . COPD (chronic obstructive pulmonary disease) ([place])  . History of stroke  . Prostate cancer ([place])  . Arthritis  . Foot drop, right  . Neuropathy ([place])  . Anemia  . Urinary incontinence, male, stress  . GERD (gastroesophageal reflux disease)  . Thalassemia  . Wears dentures  .  Deafness in left ear    Wears hearing aide in right ear . Sleep apnea    CPAP . Ischemic cardiomyopathy    LVEF 40-45% . Hypercholesteremia  . Anemia  . Osteoarthritis  . Stroke ([place])  . Heart attack ([place])  . Vertigo  . Hereditary and idiopathic peripheral neuropathy 03/01/2015 . Abnormality of gait 03/01/2015 Past Surgical History: Past Surgical History Procedure Laterality Date . Penile prosthesis implant   . Cholecystectomy   . Right carotid endarterectomy   . Back surgery   . Lumbar laminectomy  03/24/10   L3-4 and L4-5 . Ankle surgery     Bone infection . Cardiac catheterization   . Appendectomy   . Joint replacement Left  . Radioactive seed implant  2009 . Pars plana vitrectomy  03/19/2012   Procedure: PARS PLANA VITRECTOMY WITH 25 GAUGE;  Surgeon: Hayden Pedro, MD;  Location: [place];  Service: Ophthalmology;  Laterality: Right; . Carotid endarterectomy Right    CEA - 15 yrs ago . Spine surgery   . Eye surgery   . Cataract extraction w/ intraocular lens implant Left  . Endarterectomy Left 09/28/2014   Procedure: Left Carotid Endarterectomy;  Surgeon: Rosetta Posner, MD;  Location: [place];  Service: Vascular;  Laterality: Left; . Patch angioplasty Left 09/28/2014   Procedure: WITH DACRON PATCH ANGIOPLASTY;  Surgeon: Rosetta Posner, MD;  Location: [facility] OR;  Service: Vascular;  Laterality: Left; HPI: 82 yo male with history of systolic HF, HTN, CKDIII, COPD, DM II, stroke GERD, deaf in left ear and peripheral neuropathy who was brought here from rehab due to dyspnea and hypoxia. Per chart recent admission for for acute respiratory failure due to COPD exacerbation / HCAP (11/09/15). CXR right lower lobe consolidation posteriorly, likely pneumonia. Pulmonary hemorrhage could also produce this appearance. No previous ST notes found in EPIC.  MD documented pt with difficulty managing secretions and he assisted pt with oral suctioning.    CXR showed right lower lobe consolidation - posterior concerning for pna.   Subjective: pt  awake in chair Assessment / Plan / Recommendation CHL IP CLINICAL IMPRESSIONS 11/29/2015 Therapy Diagnosis Moderate oral phase dysphagia;Severe pharyngeal phase dysphagia Clinical Impression Moderate oral and severe pharyngeal dysphagia characterized mostly by gross weakness.   Pt with very poor muscle contraction resulting in  severe residuals with pt only clearing approximately 20% into esophagus.  Remainder was retained in pharynx and required cued expectoration to clear.  Trace laryngeal penetration/aspiration of liquids noted without cough response- with every liquid swallow. Pt with open larynx throughout entire swallow despite chin tuck posture.  Penetrates were moved to to upper larynx with cued throat clear but would consistently re-penetrate.  Of note, pt did cough x1 during testing - not associated with aspiration. Various techniques including effortful swallow, following solids with liquids not helpful.   Fortunately, pt able to cough/"hock" and expectorate residuals with cueing.  He is however grossly weak and just participating in this MBS was exhausting for him and pt only consumed approximately six boluses.   Pt states his swallowing ability is as good as normal and he admits to dysphagia x one year.  Pt's level of dysphagia does not allow him to meet nutritional needs   At this time based on this test and pt report, pt will aspirate intake and secretions.  Concern is present for swallowing prognosis given his report of chronic deficits.  If pt is to continue po, it should be with known risks and for comfort.  Will follow up for family education and to determine if any assistance/treatment indicated/helpful.  Educated pt to findings/recommendations live using monitor.    Impact on safety and function --   CHL IP TREATMENT RECOMMENDATION 11/27/2015 Treatment Recommendations Therapy as outlined in treatment plan below   Prognosis 11/29/2015 Prognosis for Safe Diet Advancement Guarded Barriers to Reach  Goals -- Barriers/Prognosis Comment -- CHL IP DIET RECOMMENDATION 11/29/2015 SLP Diet Recommendations NPO Liquid Administration via -- Medication Administration Via alternative means Compensations Slow rate;Small sips/bites;Other (Comment) Postural Changes --       CHL IP FREQUENCY AND DURATION 11/29/2015 Speech Therapy Frequency (ACUTE ONLY) min 2x/week Treatment Duration 1 week      CHL IP ORAL PHASE 11/29/2015 Oral Phase -- Oral - Pudding Teaspoon -- Oral - Pudding Cup -- Oral - Honey Teaspoon -- Oral - Honey Cup -- Oral - Nectar Teaspoon -- Oral - Nectar Cup -- Oral - Nectar Straw Weak lingual manipulation;Delayed oral transit Oral - Thin Teaspoon Weak lingual manipulation;Delayed oral transit Oral - Thin Cup -- Oral - Thin Straw Weak lingual manipulation;Delayed oral transit Oral - Puree Weak lingual manipulation;Delayed oral transit Oral - Mech Soft -- Oral - Regular Weak lingual manipulation;Delayed oral transit Oral - Multi-Consistency -- Oral - Pill -- Oral Phase - Comment --  CHL IP PHARYNGEAL PHASE 11/29/2015 Pharyngeal Phase Impaired Pharyngeal- Pudding Teaspoon -- Pharyngeal -- Pharyngeal- Pudding Cup -- Pharyngeal -- Pharyngeal- Honey Teaspoon -- Pharyngeal -- Pharyngeal- Honey Cup -- Pharyngeal -- Pharyngeal- Nectar Teaspoon Reduced pharyngeal peristalsis;Reduced epiglottic inversion;Reduced laryngeal elevation;Reduced airway/laryngeal closure;Reduced tongue base retraction;Pharyngeal residue - valleculae;Pharyngeal residue - pyriform Pharyngeal -- Pharyngeal- Nectar Cup -- Pharyngeal -- Pharyngeal- Nectar Straw Reduced pharyngeal peristalsis;Reduced epiglottic inversion;Reduced airway/laryngeal closure;Reduced laryngeal elevation;Reduced tongue base retraction;Penetration/Apiration after swallow;Pharyngeal residue - valleculae;Pharyngeal residue - pyriform Pharyngeal Material enters airway, passes BELOW cords without attempt by patient to eject out (silent aspiration);Material enters airway, remains  ABOVE vocal cords and not ejected out;Material enters airway, CONTACTS cords and not ejected out Pharyngeal- Thin Teaspoon Reduced pharyngeal peristalsis;Reduced epiglottic inversion;Reduced tongue base retraction;Reduced airway/laryngeal closure;Reduced laryngeal elevation;Penetration/Aspiration during swallow;Pharyngeal residue - valleculae;Pharyngeal residue - pyriform Pharyngeal Material enters airway, remains ABOVE vocal cords and not ejected out Pharyngeal- Thin Cup -- Pharyngeal -- Pharyngeal- Thin Straw Delayed swallow initiation-pyriform sinuses;Reduced pharyngeal peristalsis;Penetration/Aspiration before swallow;Reduced epiglottic  inversion Pharyngeal -- Pharyngeal- Puree Reduced pharyngeal peristalsis;Reduced epiglottic inversion;Reduced anterior laryngeal mobility;Reduced laryngeal elevation;Reduced airway/laryngeal closure;Reduced tongue base retraction;Pharyngeal residue - valleculae Pharyngeal -- Pharyngeal- Mechanical Soft -- Pharyngeal -- Pharyngeal- Regular Reduced pharyngeal peristalsis;Reduced epiglottic inversion;Reduced tongue base retraction;Pharyngeal residue - valleculae Pharyngeal -- Pharyngeal- Multi-consistency -- Pharyngeal -- Pharyngeal- Pill -- Pharyngeal -- Pharyngeal Comment chin tuck, cued cough, cued dry swallow, cued expectoration effective, dry swallows weak  Luanna Salk, MS [facility] SLP [phone]    Scheduled Meds: . acidophilus  2 capsule Oral Daily  . aspirin EC  81 mg Oral Daily  . azithromycin  500 mg Oral Daily  . carvedilol  12.5 mg Oral BID WC  . Chlorhexidine Gluconate Cloth  6 each Topical Q0600  . dextrose  1 ampule Intravenous Once  . enoxaparin (LOVENOX) injection  40 mg Subcutaneous Daily  . ferrous sulfate  325 mg Oral BID WC  . insulin aspart  0-9 Units Subcutaneous TID WC  . insulin glargine  5 Units Subcutaneous Daily  . linaclotide  145 mcg Oral Q breakfast  . losartan  25 mg Oral Daily  . magic mouthwash  5 mL Oral QID  . mirabegron ER  50 mg  Oral Daily  . mometasone-formoterol  2 puff Inhalation BID  . mupirocin ointment  1 application Nasal BID  . pantoprazole  40 mg Oral Daily  . polyethylene glycol  17 g Oral BID  . pregabalin  75 mg Oral BID  . protein supplement  2 packet Oral BID  . senna-docusate  1 tablet Oral BID  . simvastatin  10 mg Oral q1800   Continuous Infusions:      Time spent: 35 minutes    Gitel Beste  [facility] Pager [phone] If 7PM-7AM, please contact night-coverage at [email or web address], password [facility] 12/01/2015, 10:29 AM  LOS: 4 days

## 2015-12-01 NOTE — Progress Notes (Signed)
Pharmacy Antibiotic Note  [name] is a 82 y.o. male admitted on 11/27/2015 with pneumonia.   He was initially started on Rocephin & Zithromax for CAP in ED, however given hospitalization & rehab stay in past month coverage is being broadened for possible health-care acquired pathogens.   PCN allergy noted (hives).  Patient has tolerated cephalosporins previously.  Dr Dreama Saa was contacted and Zosyn order was changed to Cefepime.   Pharmacy has been consulted for Vancomycin & Cefepime dosing.  12/01/2015:   Resumed Vanc for MRSA in sputum  Afebrile   WBC  SCr trending up, CrCl ~67ml/min.  Plan: Start Vanc 1g IV q24h Continue Zithromax Check Vancomycin trough at steady state Monitor renal function and cx data   Height: 6' (182.9 cm) Weight: 158 lb 4.8 oz (71.804 kg) IBW/kg (Calculated) : 77.6  Temp (24hrs), Avg:98.3 F (36.8 C), Min:98 F (36.7 C), Max:98.5 F (36.9 C)   Recent Labs Lab 11/27/15 0648 11/27/15 0704 11/28/15 0405 11/30/15 0328 12/01/15 0324  WBC 6.9  --  4.4 6.5  --   CREATININE 1.04  --  1.05 1.24 1.33*  LATICACIDVEN  --  1.17  --   --   --     Estimated Creatinine Clearance: 38.2 mL/min (by C-G formula based on Cr of 1.33).    Allergies  Allergen Reactions  . Clopidogrel Bisulfate Hives    REACTION: red blue, black blotches  . Dilaudid [Hydromorphone Hcl] Hives and Other (See Comments)    Hallucinations, little green men working on the building  . Penicillins Hives    Has patient had a PCN reaction causing immediate rash, facial/tongue/throat swelling, SOB or lightheadedness with hypotension: Yes Has patient had a PCN reaction causing severe rash involving mucus membranes or skin necrosis: No Has patient had a PCN reaction that required hospitalization No Has patient had a PCN reaction occurring within the last 10 years: No Tolerates cephalosporins   . Sulfa Antibiotics Other (See Comments)    Nightmares, hallucinations  . Doxycycline Hyclate  Rash   Antimicrobials this admission: Rocephin 4/15 x 1 dose in ED Zithromax 4/15 >>   Vanc 4/15 >> 4/17, 4/19 >> Cefepime 4/16 >> 4/17  Dose adjustments this admission:  Microbiology results: 4/15 BCx: ngtd 4/15 Sputum: few GPC, GPR, GNR -> abundant MRSA 4/15 MRSA PCR: + 4/15 Infl Virus Ag: - 4/15 Strep UA: -  Thank you for allowing pharmacy to be a part of this patient's care.  Lolita Patella 12/01/2015 2:52 PM

## 2015-12-01 NOTE — Progress Notes (Signed)
CSW continuing to follow.   Per MD today, pt family needs to meet with palliative care to address goals of care before pt is discharged from hospital.  CSW updated [facility] and [employer].  CSW to await palliative goals of care to further assist.  Alison Murray, MSW, [place] [facility] [phone]

## 2015-12-01 NOTE — Progress Notes (Addendum)
Palliative consult received Chart reviewed in detail Call placed and discussed with daughter [name] agent over the phone [name] is requesting a family meeting later tomorrow on 12-02-15 as she would like for her 2 brothers to attend the meeting. One of her brother lives in [place], [place].   Full note and further recommendations to follow once a time for meeting tomorrow has been arranged.  Thank you for the consult Luke Chance MD [facility] palliative medicine [phone] office [phone] pager   Addendum: Call back received from patient's HCPOA daughter late yesterday evening: family meeting 12-02-15 at 70 today

## 2015-12-02 DIAGNOSIS — R0602 Shortness of breath: Secondary | ICD-10-CM

## 2015-12-02 DIAGNOSIS — Z7189 Other specified counseling: Secondary | ICD-10-CM | POA: Insufficient documentation

## 2015-12-02 DIAGNOSIS — I5022 Chronic systolic (congestive) heart failure: Secondary | ICD-10-CM

## 2015-12-02 DIAGNOSIS — J189 Pneumonia, unspecified organism: Secondary | ICD-10-CM

## 2015-12-02 DIAGNOSIS — Z515 Encounter for palliative care: Secondary | ICD-10-CM | POA: Insufficient documentation

## 2015-12-02 DIAGNOSIS — L89152 Pressure ulcer of sacral region, stage 2: Secondary | ICD-10-CM

## 2015-12-02 LAB — GLUCOSE, CAPILLARY
GLUCOSE-CAPILLARY: 91 mg/dL (ref 65–99)
Glucose-Capillary: 225 mg/dL — ABNORMAL HIGH (ref 65–99)
Glucose-Capillary: 175 mg/dL — ABNORMAL HIGH (ref 65–99)
Glucose-Capillary: 142 mg/dL — ABNORMAL HIGH (ref 65–99)

## 2015-12-02 LAB — CBC
HCT: 31.1 % — ABNORMAL LOW (ref 39.0–52.0)
Hemoglobin: 9.4 g/dL — ABNORMAL LOW (ref 13.0–17.0)
MCH: 18.8 pg — AB (ref 26.0–34.0)
MCHC: 30.2 g/dL (ref 30.0–36.0)
MCV: 62.2 fL — ABNORMAL LOW (ref 78.0–100.0)
PLATELETS: 214 10*3/uL (ref 150–400)
RBC: 5 MIL/uL (ref 4.22–5.81)
RDW: 17.6 % — ABNORMAL HIGH (ref 11.5–15.5)
WBC: 8.1 10*3/uL (ref 4.0–10.5)

## 2015-12-02 LAB — BASIC METABOLIC PANEL
Anion gap: 11 (ref 5–15)
BUN: 22 mg/dL — ABNORMAL HIGH (ref 6–20)
CALCIUM: 8.3 mg/dL — AB (ref 8.9–10.3)
CO2: 34 mmol/L — AB (ref 22–32)
CREATININE: 1 mg/dL (ref 0.61–1.24)
Chloride: 99 mmol/L — ABNORMAL LOW (ref 101–111)
GFR calc non Af Amer: >60 mL/min (ref >60)
Glucose, Bld: 106 mg/dL — ABNORMAL HIGH (ref 65–99)
Potassium: 3.8 mmol/L (ref 3.5–5.1)
SODIUM: 144 mmol/L (ref 135–145)

## 2015-12-02 LAB — CULTURE, BLOOD (ROUTINE X 2)
Culture: NO GROWTH
Culture: NO GROWTH

## 2015-12-02 MED ORDER — FUROSEMIDE 40 MG PO TABS
40.0000 mg | ORAL_TABLET | Freq: Every day | ORAL | Status: DC
  Administered 2015-12-02 – 2015-12-03 (×2): 40 mg via ORAL
  Filled 2015-12-02 (×2): qty 1

## 2015-12-02 MED ORDER — VANCOMYCIN HCL IN DEXTROSE 750-5 MG/150ML-% IV SOLN
750.0000 mg | Freq: Two times a day (BID) | INTRAVENOUS | Status: DC
  Administered 2015-12-02 (×2): 750 mg via INTRAVENOUS
  Filled 2015-12-02 (×4): qty 150

## 2015-12-02 MED ORDER — GLYCOPYRROLATE 0.2 MG/ML IJ SOLN
0.4000 mg | INTRAMUSCULAR | Status: DC | PRN
  Filled 2015-12-02: qty 2

## 2015-12-02 MED ORDER — FENTANYL CITRATE (PF) 100 MCG/2ML IJ SOLN: 25.0000 ug | INTRAMUSCULAR | Status: DC | PRN

## 2015-12-02 MED ORDER — POTASSIUM CHLORIDE CRYS ER 20 MEQ PO TBCR
40.0000 meq | EXTENDED_RELEASE_TABLET | Freq: Once | ORAL | Status: AC
  Administered 2015-12-02: 40 meq via ORAL
  Filled 2015-12-02: qty 2

## 2015-12-02 NOTE — Consult Note (Signed)
Consultation Note Date: 12/02/2015   Patient Name: [name]  DOB: September 26, 1923  MRN: [id number]  Age / Sex: 82 y.o., male  PCP: Luke Kettle, MD Referring Physician: Domenic Polite, MD  Reason for Consultation: Establishing goals of care    Clinical Assessment/Narrative:  Patient is an 82 yo gentleman with a past medical history significant for chronic obstructive pulmonary disease, stage III chronic kidney disease, chronic congestive heart failure ejection fraction 40-45 percent, recent recurrent readmissions for various conditions as such as volume overload, ileus, acute kidney injury, pneumonia. Patient was recently at [facility] facility and subsequently came to the hospital this time from [facility].  The patient has been admitted this time with recurrent aspiration pneumonia, patient has recently been on healthcare associated pneumonia treatment with various broad-spectrum antibiotics. Workup in this hospitalization included speech and language pathology evaluation, modified barium swallow study which eluded to severe pharyngeal dysphagia, with the patient demonstrating severe residuals. The patient has had episodic chest discomfort, cough, guarding/noisy respirations. He also has stage II pressure ulcer, chronic anemia, diabetes and hypertension.  Palliative care consultation for goals of care discussions.  Met with the patient's daughter [name] who is the healthcare power of attorney agent, patient's 2 sons and grandson. One of the patient's son drove in from [place], [place]. Extensive discussions undertaken outside the patient's home initially with regards to patient's overall condition, background information regarding his baseline prior to these recent recurrent hospitalizations. The patient was living with his brother in [place], [place]. A year ago, the patient had serious hospitalization,  status post vent dependent respiratory failure. Since then, he has been initially with 24 7 caregivers and now with caregivers for 12 hours prior to these current hospitalizations in March and April 2017. Family does not report that the patient has had any serious or significant coughing choking spells that they have witnessed. However, they do endorse and a college that the patient has had ongoing physical decline.  Discussions and undertaken inside the patient's room. Patient is a little hard of hearing but otherwise is able to fully comprehend, engage and participate in his own decision-making. Discussed about his recent swallowing study results, his recent hospitalizations for pneumonia deemed likely aspirational in etiology, underlying heart failure. Discussed about his symptom burden. CODE STATUS and goals of care discussions undertaken.  Brief life review performed. The patient states that he has lived a long life and satisfactorily. Hence, discussions led to end of life type decision making. After extensive discussions, patient wishes to focus exclusively on comfort. CODE STATUS established as DO NOT RESUSCITATE/DO NOT INTUBATE. Discussed about hospice services. See below, thank you for the consult.   Contacts/Participants in Discussion: Primary Decision Maker:     Relationship to Patient   HCPOA: yes   daughter [name]  SUMMARY OF RECOMMENDATIONS: After extensive discussions, CODE STATUS now established as DO NOT RESUSCITATE,DNI When necessary Fentanyl IV PRN Add IV Robinul PRN for secretions SW consult for residential hospice on 12-03-15.  May D/C antibiotics, insulin drip etc when patient is being d/c to hospice.   Code Status/[place] Care Planning: DNR    Code Status Orders        Start     Ordered   12/02/15 0174  Do not attempt resuscitation (DNR)   Continuous    Question Answer Comment  In the event of cardiac or respiratory ARREST Do not call a "code blue"   In the event  of cardiac or respiratory ARREST Do not  perform Intubation, CPR, defibrillation or ACLS   In the event of cardiac or respiratory ARREST Use medication by any route, position, wound care, and other measures to relive pain and suffering. May use oxygen, suction and manual treatment of airway obstruction as needed for comfort.      12/02/15 1554    Code Status History    Date Active Date Inactive Code Status Order ID Comments User Context   11/27/2015  9:01 AM 12/02/2015  3:54 PM Full Code [id number]  Gennaro Africa, MD ED   11/09/2015 10:37 PM 11/15/2015  6:12 PM Full Code [id number]  Norval Morton, MD ED   11/09/2015 10:37 PM 11/09/2015 10:37 PM Full Code [id number]  Norval Morton, MD ED   10/24/2015  4:50 PM 11/02/2015  6:36 PM Full Code [id number]  Kelvin Cellar, MD Inpatient   10/15/2015  2:01 AM 10/23/2015  4:03 PM Full Code [id number]  Quintella Baton, MD Inpatient   09/20/2015  9:36 PM 09/23/2015  4:41 PM Full Code [id number]  Vianne Bulls, MD ED   01/08/2015  9:19 AM 01/14/2015  4:48 PM Full Code [id number]  Erick Colace, NP ED   09/28/2014  3:23 PM 09/29/2014  1:51 PM Full Code [id number]  Alvia Grove, PA-C Inpatient      Other Directives:Advanced Directive  Symptom Management:    Add fentanyl IV when necessary  Palliative Prophylaxis:   Delirium Protocol  Additional Recommendations (Limitations, Scope, Preferences):  Full Comfort Care  Psycho-social/Spiritual:  Support System: [place] Desire for further Chaplaincy support:yes Additional Recommendations: Education on Hospice  Prognosis: < 2 weeks  Discharge Planning: Hospice facility  Social worker consultation for residential hospice by 12-03-15.   Chief Complaint/ Primary Diagnoses: Present on Admission:  . HCAP (healthcare-associated pneumonia) . Pneumonia . Chronic systolic CHF (congestive heart failure) ([place]) . Sacral ulcer  I have reviewed the medical record, interviewed the patient and family, and examined the patient. The  following aspects are pertinent.  Past Medical History  Diagnosis Date  . Carotid artery stenosis   . Essential hypertension   . Myocardial infarction ([place]) 1979  . Coronary artery disease     Known occluded proximal LAD with collaterals, moderate RCA disease - managed medically 2009  . COPD (chronic obstructive pulmonary disease) ([place])   . History of stroke   . Prostate cancer ([place])   . Arthritis   . Foot drop, right   . Neuropathy ([place])   . Anemia   . Urinary incontinence, male, stress   . GERD (gastroesophageal reflux disease)   . Thalassemia   . Wears dentures   . Deafness in left ear     Wears hearing aide in right ear  . Sleep apnea     CPAP  . Ischemic cardiomyopathy     LVEF 40-45%  . Hypercholesteremia   . Anemia   . Osteoarthritis   . Stroke ([place])   . Heart attack ([place])   . Vertigo   . Hereditary and idiopathic peripheral neuropathy 03/01/2015  . Abnormality of gait 03/01/2015   Social History   Social History  . Marital Status: Married    Spouse Name: N/A  . Number of Children: 3  . Years of Education: GED   Occupational History  . retired    Social History Main Topics  . Smoking status: Former Smoker -- 1.00 packs/day for 20 years    Types: Cigarettes    Quit date: 08/14/1977  . Smokeless tobacco: Never Used  .  Alcohol Use: No     Comment: occasional  . Drug Use: No  . Sexual Activity: Not Asked   Other Topics Concern  . None   Social History Narrative   Patient does not drink caffeine.   Patient is left handed.   Family History  Problem Relation Age of Onset  . Thalassemia      Family history  . Cancer Mother 75    Bone cancer  . Diabetes Mother   . Heart disease Mother   . Heart attack Mother   . Hypertension Mother   . Stroke Father 84  . Heart disease Father   . Hypertension Father   . Hypertension Sister   . Diabetes Sister   . Heart disease Brother   . Heart attack Brother 42  . Cancer Brother    Scheduled Meds: .  acidophilus  2 capsule Oral Daily  . aspirin EC  81 mg Oral Daily  . azithromycin  500 mg Oral Daily  . carvedilol  12.5 mg Oral BID WC  . Chlorhexidine Gluconate Cloth  6 each Topical Q0600  . dextrose  1 ampule Intravenous Once  . enoxaparin (LOVENOX) injection  40 mg Subcutaneous Daily  . ferrous sulfate  325 mg Oral BID WC  . furosemide  40 mg Oral Daily  . insulin aspart  0-9 Units Subcutaneous TID WC  . linaclotide  145 mcg Oral Q breakfast  . magic mouthwash  5 mL Oral QID  . mirabegron ER  50 mg Oral Daily  . mometasone-formoterol  2 puff Inhalation BID  . mupirocin ointment  1 application Nasal BID  . pantoprazole  40 mg Oral Daily  . polyethylene glycol  17 g Oral BID  . pregabalin  75 mg Oral BID  . protein supplement  2 packet Oral BID  . senna-docusate  1 tablet Oral BID  . simvastatin  10 mg Oral q1800  . vancomycin  750 mg Intravenous Q12H   Continuous Infusions:  PRN Meds:.acetaminophen, fentaNYL (SUBLIMAZE) injection, glycopyrrolate, guaiFENesin, ipratropium-albuterol, meclizine, nitroGLYCERIN Medications Prior to Admission:  Prior to Admission medications   Medication Sig Start Date End Date Taking? Authorizing Provider  acetaminophen (TYLENOL) 325 MG tablet Take 650 mg by mouth every 6 (six) hours as needed for mild pain.    Yes Historical Provider, MD  acidophilus (RISAQUAD) CAPS capsule Take 2 capsules by mouth daily. 11/15/15 12/15/15 Yes Silver Huguenin Elgergawy, MD  albuterol (PROVENTIL) (2.5 MG/3ML) 0.083% nebulizer solution Take 3 mLs (2.5 mg total) by nebulization every 6 (six) hours as needed for wheezing. 11/15/15  Yes Albertine Patricia, MD  aspirin EC 81 MG tablet Take 81 mg by mouth daily.   Yes Historical Provider, MD  carvedilol (COREG) 12.5 MG tablet TAKE 1 TABLET TWICE DAILY WITH A MEAL 06/08/15  Yes Dorothy Spark, MD  ferrous sulfate 325 (65 FE) MG tablet Take 1 tablet (325 mg total) by mouth 2 (two) times daily with a meal. 11/02/15  Yes Lavina Hamman,  MD  furosemide (LASIX) 40 MG tablet Take 40 mg by mouth.   Yes Historical Provider, MD  Glycopyrrolate-Formoterol (BEVESPI AEROSPHERE) 9-4.8 MCG/ACT AERO Inhale 2 puffs into the lungs every 12 (twelve) hours. 07/19/15  Yes Tanda Rockers, MD  guaiFENesin (MUCINEX) 600 MG 12 hr tablet Take 1 tablet (600 mg total) by mouth 2 (two) times daily. Please take for 2 weeks. 11/15/15  Yes Silver Huguenin Elgergawy, MD  guaifenesin (ROBITUSSIN) 100 MG/5ML syrup Take 10 mg  by mouth every 4 (four) hours as needed for cough.   Yes Historical Provider, MD  insulin aspart (NOVOLOG) 100 UNIT/ML injection Inject 0-9 Units into the skin 3 (three) times daily with meals. For CBGs 0-69 = 0 units, 70-120 = 0 units, 121-150 = 1 unit, 151-200 = 2 units, 201-250 = 3 units, 252-300 = 5 units, 301-350 = 7 units, 351-400 = 9 units   Yes Historical Provider, MD  insulin glargine (LANTUS) 100 UNIT/ML injection Inject 0.05 mLs (5 Units total) into the skin daily. Patient taking differently: Inject 5 Units into the skin at bedtime.  11/15/15  Yes Dawood S Elgergawy, MD  ipratropium-albuterol (DUONEB) 0.5-2.5 (3) MG/3ML SOLN Take 3 mLs by nebulization 3 (three) times daily. 11/15/15  Yes Albertine Patricia, MD  LINZESS 145 MCG CAPS capsule Take 1 capsule by mouth daily with breakfast. 06/29/15  Yes Historical Provider, MD  losartan (COZAAR) 25 MG tablet Take 1 tablet (25 mg total) by mouth daily. 01/14/15  Yes Marijean Heath, NP  magic mouthwash SOLN Take 5 mLs by mouth 4 (four) times daily. 11/15/15  Yes Albertine Patricia, MD  meclizine (ANTIVERT) 25 MG tablet Take 1 tablet (25 mg total) by mouth daily as needed for dizziness. 01/29/15  Yes Dorothy Spark, MD  mometasone-formoterol (DULERA) 200-5 MCG/ACT AERO Inhale 2 puffs into the lungs 2 (two) times daily. 10/01/15  Yes Tanda Rockers, MD  MYRBETRIQ 50 MG TB24 tablet Take 50 mg by mouth daily. 06/29/15  Yes Historical Provider, MD  NITROSTAT 0.4 MG SL tablet PLACE 1 TABLET (0.4 MG TOTAL)  UNDER THE TONGUE EVERY 5 (FIVE) MINUTES AS NEEDED. FOR CHEST PAIN 04/08/15  Yes Dorothy Spark, MD  omeprazole (PRILOSEC) 20 MG capsule Take 40 mg by mouth daily.   Yes Historical Provider, MD  polyethylene glycol (MIRALAX / GLYCOLAX) packet Take 17 g by mouth 2 (two) times daily. 11/02/15  Yes Lavina Hamman, MD  Protein (PROCEL) POWD Take 2 scoop by mouth 2 (two) times daily.   Yes Historical Provider, MD  senna-docusate (SENOKOT-S) 8.6-50 MG tablet Take 1 tablet by mouth 2 (two) times daily. 11/02/15  Yes Lavina Hamman, MD  simvastatin (ZOCOR) 10 MG tablet Take 10 mg by mouth daily at 6 PM.  10/11/15  Yes Historical Provider, MD  tuberculin 5 UNIT/0.1ML injection Inject 0.1 mLs into the skin See admin instructions. Inject- read and record in 48-72 hours   Yes Historical Provider, MD  ciprofloxacin (CIPRO) 500 MG tablet Take 1 tablet (500 mg total) by mouth 2 (two) times daily. 11/30/15   Verlee Monte, MD  metroNIDAZOLE (FLAGYL) 500 MG tablet Take 1 tablet (500 mg total) by mouth 3 (three) times daily. 11/30/15   Verlee Monte, MD  predniSONE (DELTASONE) 10 MG tablet Please take 3 tablets for 3 days, then 2 tablets for 3 days, then 1 tablet for 3 days, then stop Patient not taking: Reported on 11/27/2015 11/15/15   Silver Huguenin Elgergawy, MD  pregabalin (LYRICA) 75 MG capsule Take 1 capsule (75 mg total) by mouth 2 (two) times daily. 12/01/15   Estill Dooms, MD   Allergies  Allergen Reactions  . Clopidogrel Bisulfate Hives    REACTION: red blue, black blotches  . Dilaudid [Hydromorphone Hcl] Hives and Other (See Comments)    Hallucinations, little green men working on the building  . Penicillins Hives    Has patient had a PCN reaction causing immediate rash, facial/tongue/throat swelling, SOB or lightheadedness  with hypotension: Yes Has patient had a PCN reaction causing severe rash involving mucus membranes or skin necrosis: No Has patient had a PCN reaction that required hospitalization No Has  patient had a PCN reaction occurring within the last 10 years: No Tolerates cephalosporins   . Sulfa Antibiotics Other (See Comments)    Nightmares, hallucinations  . Doxycycline Hyclate Rash    Review of Systems + for cough, secretions, chest pain.   Physical Exam Weak frail elderly gentleman resting in bed Coarse rhonchorous breath sounds anteriorly S1-S2 Abdomen soft No edema Patient is awake alert answers all questions appropriately Hard of hearing  Vital Signs: BP 130/49 mmHg  Pulse 74  Temp(Src) 97.4 F (36.3 C) (Oral)  Resp 24  Ht 6' (1.829 m)  Wt 71.804 kg (158 lb 4.8 oz)  BMI 21.46 kg/m2  SpO2 100%  SpO2: SpO2: 100 % O2 Device:SpO2: 100 % O2 Flow Rate: .O2 Flow Rate (L/min): 3 L/min  IO: Intake/output summary:  Intake/Output Summary (Last 24 hours) at 12/02/15 1605 Last data filed at 12/02/15 1000  Gross per 24 hour  Intake    540 ml  Output    950 ml  Net   -410 ml    LBM: Last BM Date: 11/25/15 Baseline Weight: Weight: 71.804 kg (158 lb 4.8 oz) Most recent weight: Weight: 71.804 kg (158 lb 4.8 oz)      Palliative Assessment/Data:  Flowsheet Rows        Most Recent Value   Intake Tab    Referral Department  Hospitalist   Unit at Time of Referral  Med/Surg Unit   Palliative Care Primary Diagnosis  Other (Comment)   Palliative Care Type  New Palliative care   Reason for referral  Clarify Goals of Care, Counsel Regarding Hospice, End of [place]   Date first seen by Palliative Care  12/02/15   Clinical Assessment    Palliative Performance Scale Score  20%   Pain Max last 24 hours  4   Pain Min Last 24 hours  3   Dyspnea Max Last 24 Hours  4   Dyspnea Min Last 24 hours  3   Psychosocial & Spiritual Assessment    Palliative Care Outcomes    Patient/Family meeting held?  Yes   Who was at the meeting?  daughter, patient, 2 sons, grand son.    Palliative Care Outcomes  Clarified goals of care   Palliative Care follow-up planned  No        Additional Data Reviewed:  CBC:    Component Value Date/Time   WBC 8.1 12/02/2015 0346   WBC 7.7 11/15/2015   WBC 5.7 04/21/2008 1131   HGB 9.4* 12/02/2015 0346   HGB 10.6* 04/21/2008 1131   HCT 31.1* 12/02/2015 0346   HCT 33.6* 04/21/2008 1131   PLT 214 12/02/2015 0346   PLT 167 04/21/2008 1131   MCV 62.2* 12/02/2015 0346   MCV 67.0* 04/21/2008 1131   NEUTROABS 3.8 11/28/2015 0405   NEUTROABS 2.9 04/21/2008 1131   LYMPHSABS 0.4* 11/28/2015 0405   LYMPHSABS 2.0 04/21/2008 1131   MONOABS 0.2 11/28/2015 0405   MONOABS 0.4 04/21/2008 1131   EOSABS 0.0 11/28/2015 0405   EOSABS 0.3 04/21/2008 1131   BASOSABS 0.0 11/28/2015 0405   BASOSABS 0.0 04/21/2008 1131   Comprehensive Metabolic Panel:    Component Value Date/Time   NA 144 12/02/2015 0346   NA 141 11/15/2015   K 3.8 12/02/2015 0346   CL 99* 12/02/2015  0346   CO2 34* 12/02/2015 0346   BUN 22* 12/02/2015 0346   BUN 28* 11/15/2015   CREATININE 1.00 12/02/2015 0346   CREATININE 1.1 11/15/2015   CREATININE 1.10 04/24/2011 1200   GLUCOSE 106* 12/02/2015 0346   CALCIUM 8.3* 12/02/2015 0346   AST 10* 11/28/2015 0405   ALT 11* 11/28/2015 0405   ALKPHOS 56 11/28/2015 0405   BILITOT 0.7 11/28/2015 0405   PROT 5.8* 11/28/2015 0405   ALBUMIN 2.1* 11/28/2015 0405     Time In: 9969 Time Out: 1610 Time Total: 85 Greater than 50%  of this time was spent counseling and coordinating care related to the above assessment and plan.  Signed by: Loistine Chance, MD [id number] Loistine Chance, MD  12/02/2015, 4:05 PM  Please contact Palliative Medicine Team phone at [phone] for questions and concerns.

## 2015-12-02 NOTE — Progress Notes (Signed)
Appreciate referral for palliative care with pt's severe dysphagia.  Note palliative meeting underway currently and MD educated family to pt's ongoing aspiration and severe dysphagia.  Will sign off, please reorder if desire. Luanna Salk, [facility] SLP [phone]

## 2015-12-02 NOTE — Progress Notes (Signed)
PT Cancellation Note  Patient Details Name: BENJAMYN DEORIO MRN: [id number] DOB: 1924/05/20   Cancelled Treatment:     cancel per RN.  Stated they got pt to [facility] earlier and it took everything out of him.  They had to put him back to bed.     Nathanial Rancher 12/02/2015, 1:13 PM

## 2015-12-02 NOTE — Progress Notes (Signed)
Nutrition Brief Note  Patient in palliative care meeting during time of visit. Will follow-up tomorrow dependent upon goals of care and appropriateness of vision.  [name]. Kaan Tosh, MS, RD LDN After Hours/Weekend Pager [phone]

## 2015-12-02 NOTE — Progress Notes (Signed)
Pharmacy Antibiotic Note  [name] is a 82 y.o. male admitted on 11/27/2015 with pneumonia.   He was initially started on Rocephin & Zithromax for CAP in ED, however given hospitalization & rehab stay in past month coverage is being broadened for possible health-care acquired pathogens.   PCN allergy noted (hives).  Patient has tolerated cephalosporins previously.  Dr Dreama Saa was contacted and Zosyn order was changed to Cefepime.   Pharmacy has been consulted for Vancomycin & Cefepime dosing.  12/02/2015:   Resumed Vanc for MRSA in sputum  Improved renal function   Plan: Increase to Vanc 750mg  q12h Continue Zithromax per MD Check Vancomycin trough at steady state Monitor renal function and cx data   Height: 6' (182.9 cm) Weight: 158 lb 4.8 oz (71.804 kg) IBW/kg (Calculated) : 77.6  Temp (24hrs), Avg:97.8 F (36.6 C), Min:97.4 F (36.3 C), Max:98 F (36.7 C)   Recent Labs Lab 11/27/15 0648 11/27/15 0704 11/28/15 0405 11/30/15 0328 12/01/15 0324 12/02/15 0346  WBC 6.9  --  4.4 6.5  --  8.1  CREATININE 1.04  --  1.05 1.24 1.33* 1.00  LATICACIDVEN  --  1.17  --   --   --   --     Estimated Creatinine Clearance: 50.9 mL/min (by C-G formula based on Cr of 1).    Allergies  Allergen Reactions  . Clopidogrel Bisulfate Hives    REACTION: red blue, black blotches  . Dilaudid [Hydromorphone Hcl] Hives and Other (See Comments)    Hallucinations, little green men working on the building  . Penicillins Hives    Has patient had a PCN reaction causing immediate rash, facial/tongue/throat swelling, SOB or lightheadedness with hypotension: Yes Has patient had a PCN reaction causing severe rash involving mucus membranes or skin necrosis: No Has patient had a PCN reaction that required hospitalization No Has patient had a PCN reaction occurring within the last 10 years: No Tolerates cephalosporins   . Sulfa Antibiotics Other (See Comments)    Nightmares, hallucinations  .  Doxycycline Hyclate Rash   Antimicrobials this admission: Rocephin 4/15 x 1 dose in ED Zithromax 4/15 >>   Vanc 4/15 >> 4/17, 4/19 >> Cefepime 4/16 >> 4/17  Dose adjustments this admission: Vanc 1g q24h --> 750mg  q12h  Microbiology results: 4/15 BCx: ngtd 4/15 Sputum: few GPC, GPR, GNR -> abundant MRSA 4/15 MRSA PCR: + 4/15 Infl Virus Ag: - 4/15 Strep UA: -  Thank you for allowing pharmacy to be a part of this patient's care.  Ralene Bathe, PharmD, BCPS 12/02/2015, 9:48 AM  Pager: [phone]

## 2015-12-02 NOTE — Progress Notes (Signed)
[facility] PROGRESS NOTE   VIRL DONSON [id number] DOB: Dec 17, 1923 DOA: 11/27/2015 PCP:  Melinda Crutch, MD  HPI/Subjective: Per RN very Poor PO intake, c/o dyspnea and cough  Assessment/Plan:  Recurrent Aspiration pneumonia -has underlying COPD too -was on HCAP coverage with vancomycin, cefepime and azithromycin, - Abx changed to Azithromycin alone i restarted Vanc 4/19-sputum Cx with MRSA and multiple allergies noted -He was recently treated for Escherichia coli pneumonia, likely aspiration related, this is 4th admission since March for similar issues -clinically has ongoing aspiration -s/p MBS and SLP eval: severe pharyngeal Dysphagia, severe residuals with pt only clearing approximately 20% into esophagus, remainder was retained in pharynx, recommended Po only for comfort understanding aspiration risks -I called Daughter [name] 4/19 and recommended consideration of DNR and Palliative meeting -Plan for family meeting this afternoon, daughter wants more discussion about resuscitation in person, i emphasized that he will continue to Aspirate and decline further at any time  Systolic CHF: Echo in March 2017 with EF of 40-45% -euvolemic now -hold cozaar, resume lasix today  Anemia : Microcytic : iron deficiency and chronic disease, At baseline Colonoscopy in 2005 was with benign polyps Continue iron supplements   HTN: continue home meds  DMII: -CBGs were in 70-90 range, hence stopped lantus, po intake Poor - SS low correction  Stage II pressure ulcer: Wound care.  DVt proph: Lovenox  Code Status: Full Code Family Communication: called and d/w Daughter [name] 4/19 Disposition Plan: SNF , ? Hospice, pending Palliative meeting  Consultants:  PCCM  Procedures:  None  Antibiotics:  Vancomycin, and azithromycin (indicate start date, and stop date if known)   Objective: Filed Vitals:   12/02/15 0820 12/02/15 0824  BP: 154/74 154/74  Pulse: 88 88  Temp:     Resp:      Intake/Output Summary (Last 24 hours) at 12/02/15 1242 Last data filed at 12/02/15 1000  Gross per 24 hour  Intake    540 ml  Output    950 ml  Net   -410 ml   Filed Weights   11/27/15 1040  Weight: 71.804 kg (158 lb 4.8 oz)    Exam: General: Alert and awake, oriented x to self and place, no distress. HEENT: anicteric sclera, pupils reactive to light and accommodation, EOMI CVS: S1-S2 clear, no murmur rubs or gallops Chest: basilar ronchi B/L and conducted upper airway sounds Abdomen: soft nontender, nondistended, normal bowel sounds, no organomegaly Extremities: no cyanosis, clubbing or edema noted bilaterally Neuro: Cranial nerves II-XII intact, no focal neurological deficits  Data Reviewed: Basic Metabolic Panel:  Recent Labs Lab 11/27/15 0648 11/28/15 0405 11/30/15 0328 12/01/15 0324 12/02/15 0346  NA 141 141 141 145 144  K 3.9 3.7 3.3* 3.9 3.8  CL 102 103 98* 99* 99*  CO2 30 29 36* 38* 34*  GLUCOSE 158* 157* 166* 88 106*  BUN 22* 24* 29* 26* 22*  CREATININE 1.04 1.05 1.24 1.33* 1.00  CALCIUM 8.7* 8.1* 8.4* 8.8* 8.3*   Liver Function Tests:  Recent Labs Lab 11/28/15 0405  AST 10*  ALT 11*  ALKPHOS 56  BILITOT 0.7  PROT 5.8*  ALBUMIN 2.1*   No results for input(s): LIPASE, AMYLASE in the last 168 hours. No results for input(s): AMMONIA in the last 168 hours. CBC:  Recent Labs Lab 11/27/15 0648 11/28/15 0405 11/30/15 0328 12/02/15 0346  WBC 6.9 4.4 6.5 8.1  NEUTROABS 5.7 3.8  --   --   HGB 8.9* 8.6* 9.0* 9.4*  HCT 28.6* 28.3* 29.5* 31.1*  MCV 62.7* 62.6* 62.8* 62.2*  PLT 170 153 190 214   Cardiac Enzymes: No results for input(s): CKTOTAL, CKMB, CKMBINDEX, TROPONINI in the last 168 hours. BNP (last 3 results)  Recent Labs  09/20/15 1833 10/26/15 0245 11/09/15 1923  BNP 196.1* 631.1* 423.8*    ProBNP (last 3 results) No results for input(s): PROBNP in the last 8760 hours.  CBG:  Recent Labs Lab 12/01/15 1259  12/01/15 1703 12/01/15 2120 12/02/15 0808 12/02/15 1235  GLUCAP 93 89 105* 91 225*    Micro Recent Results (from the past 240 hour(s))  Blood Culture (routine x 2)     Status: None (Preliminary result)   Collection Time: 11/27/15  6:50 AM  Result Value Ref Range Status   Specimen Description BLOOD RIGHT HAND  5 ML IN [facility] BOTTLE  Final   Special Requests NONE  Final   Culture   Final    NO GROWTH 4 DAYS Performed at [facility]    Report Status PENDING  Incomplete  Blood Culture (routine x 2)     Status: None (Preliminary result)   Collection Time: 11/27/15  8:25 AM  Result Value Ref Range Status   Specimen Description BLOOD LEFT WRIST  5 ML IN [facility] BOTTLE  Final   Special Requests NONE  Final   Culture   Final    NO GROWTH 4 DAYS Performed at [facility]    Report Status PENDING  Incomplete  Influenza virus ag, a+b (DFA)     Status: None   Collection Time: 11/27/15 10:44 AM  Result Value Ref Range Status   Influenza Virus A and B Ag REPORT  Corrected    Comment: (NOTE) Influenza Virus Type A#B Antigen, DFA SOURCE : NOT SUPPLIED Influenza Type A Ag, DFA        Negative for influenza virus Influenza Type B Ag, DFA        Negative for influenza virus For maximum sensitivity in the diagnosis of viral infections, a negative or suspected positive result should be confirmed by viral culture. Performed at [place] 04/18 AT 1802: PREVIOUSLY REPORTED AS REPORT   Culture, sputum-assessment     Status: None   Collection Time: 11/27/15 11:30 AM  Result Value Ref Range Status   Specimen Description SPUTUM  Final   Special Requests NONE  Final   Sputum evaluation THIS SPECIMEN IS ACCEPTABLE FOR SPUTUM CULTURE  Final   Report Status 11/27/2015 FINAL  Final  MRSA PCR Screening     Status: Abnormal   Collection Time: 11/27/15 11:30 AM  Result Value Ref Range Status   MRSA by PCR POSITIVE (A) NEGATIVE Final    Comment:        The  GeneXpert MRSA Assay (FDA approved for NASAL specimens only), is one component of a comprehensive MRSA colonization surveillance program. It is not intended to diagnose MRSA infection nor to guide or monitor treatment for MRSA infections. RESULT CALLED TO, READ BACK BY AND VERIFIED WITH: E.MACABUEAG RN AT [id number] ON 11/27/15 BY S.VANHOORNE   Culture, respiratory (NON-Expectorated)     Status: None   Collection Time: 11/27/15 11:30 AM  Result Value Ref Range Status   Specimen Description SPUTUM  Final   Special Requests NONE  Final   Gram Stain   Final    FEW WBC PRESENT,BOTH PMN AND MONONUCLEAR FEW SQUAMOUS EPITHELIAL CELLS PRESENT MODERATE GRAM POSITIVE COCCI IN CLUSTERS FEW GRAM POSITIVE RODS  FEW GRAM NEGATIVE RODS Performed at [facility]    Culture   Final    ABUNDANT METHICILLIN RESISTANT STAPHYLOCOCCUS AUREUS Note: RIFAMPIN AND GENTAMICIN SHOULD NOT BE USED AS SINGLE DRUGS FOR TREATMENT OF STAPH INFECTIONS. This organism is presumed to be Clindamycin resistant based on detection of inducible Clindamycin resistance. CRITICAL RESULT CALLED TO, READ BACK BY AND  VERIFIED WITH: ELSIE RN 4.19 17 BY PERCN Performed at [facility]    Report Status 12/01/2015 FINAL  Final   Organism ID, Bacteria METHICILLIN RESISTANT STAPHYLOCOCCUS AUREUS  Final      Susceptibility   Methicillin resistant staphylococcus aureus - MIC*    CLINDAMYCIN RESISTANT      ERYTHROMYCIN >=8 RESISTANT Resistant     GENTAMICIN <=0.5 SENSITIVE Sensitive     LEVOFLOXACIN >=8 RESISTANT Resistant     OXACILLIN >=4 RESISTANT Resistant     RIFAMPIN <=0.5 SENSITIVE Sensitive     TRIMETH/SULFA <=10 SENSITIVE Sensitive     VANCOMYCIN 1 SENSITIVE Sensitive     TETRACYCLINE <=1 SENSITIVE Sensitive     * ABUNDANT METHICILLIN RESISTANT STAPHYLOCOCCUS AUREUS     Studies: No results found.  Scheduled Meds: . acidophilus  2 capsule Oral Daily  . aspirin EC  81 mg Oral Daily  . azithromycin  500 mg  Oral Daily  . carvedilol  12.5 mg Oral BID WC  . Chlorhexidine Gluconate Cloth  6 each Topical Q0600  . dextrose  1 ampule Intravenous Once  . enoxaparin (LOVENOX) injection  40 mg Subcutaneous Daily  . ferrous sulfate  325 mg Oral BID WC  . insulin aspart  0-9 Units Subcutaneous TID WC  . linaclotide  145 mcg Oral Q breakfast  . magic mouthwash  5 mL Oral QID  . mirabegron ER  50 mg Oral Daily  . mometasone-formoterol  2 puff Inhalation BID  . mupirocin ointment  1 application Nasal BID  . pantoprazole  40 mg Oral Daily  . polyethylene glycol  17 g Oral BID  . pregabalin  75 mg Oral BID  . protein supplement  2 packet Oral BID  . senna-docusate  1 tablet Oral BID  . simvastatin  10 mg Oral q1800  . vancomycin  750 mg Intravenous Q12H   Continuous Infusions:      Time spent: 35 minutes    Yarimar Lavis  [facility] Pager [phone] If 7PM-7AM, please contact night-coverage at [email or web address], password [facility] 12/02/2015, 12:42 PM  LOS: 5 days

## 2015-12-03 DIAGNOSIS — J690 Pneumonitis due to inhalation of food and vomit: Principal | ICD-10-CM

## 2015-12-03 LAB — GLUCOSE, CAPILLARY
GLUCOSE-CAPILLARY: 138 mg/dL — AB (ref 65–99)
Glucose-Capillary: 171 mg/dL — ABNORMAL HIGH (ref 65–99)

## 2015-12-03 MED ORDER — MORPHINE SULFATE (CONCENTRATE) 10 MG /0.5 ML PO SOLN: 5.0000 mg | ORAL | Status: AC | PRN

## 2015-12-03 NOTE — Progress Notes (Signed)
Daily Progress Note   Patient Name: [name]       Date: 12/03/2015 DOB: 29-Sep-1923  Age: 82 y.o. MRN#: [id number] Attending Physician: Domenic Polite, MD Primary Care Physician:  Melinda Crutch, MD Admit Date: 11/27/2015  Reason for Consultation/Follow-up: Terminal Care  Subjective:  awake but not alert Resting in bed Trying to use bedside suction.  Does not verbalize this am.   Interval Events: Grand son spent the night in the hospital, patient rested off/on through out the night.  Discussed again with grand son: the patient has severe dysphagia, grand son states that family is well aware that patient would not want PEG tube.  Family wishes to continue comfort care, they have discussed regarding [place] amongst themselves last night Miller son does state that one of the patient's sons is "taking this the hardest" How ever, he states that they want to honor the patient's wishes and are agreeable to hospice home on d.c    Length of Stay: 6 days  Current Medications: Scheduled Meds:  . azithromycin  500 mg Oral Daily  . carvedilol  12.5 mg Oral BID WC  . dextrose  1 ampule Intravenous Once  . enoxaparin (LOVENOX) injection  40 mg Subcutaneous Daily  . furosemide  40 mg Oral Daily  . insulin aspart  0-9 Units Subcutaneous TID WC  . magic mouthwash  5 mL Oral QID  . mirabegron ER  50 mg Oral Daily  . mometasone-formoterol  2 puff Inhalation BID  . pantoprazole  40 mg Oral Daily  . polyethylene glycol  17 g Oral BID  . pregabalin  75 mg Oral BID  . protein supplement  2 packet Oral BID  . senna-docusate  1 tablet Oral BID  . simvastatin  10 mg Oral q1800  . vancomycin  750 mg Intravenous Q12H    Continuous Infusions:    PRN Meds: acetaminophen, fentaNYL (SUBLIMAZE)  injection, glycopyrrolate, guaiFENesin, ipratropium-albuterol, meclizine, nitroGLYCERIN  Physical Exam: Physical Exam             Frail pale elderly gentleman Coarse rhonchi anterior S1 S2 Abdomen soft No edema Awake but not alert In no distress currently  Vital Signs: BP 149/75 mmHg  Pulse 67  Temp(Src) 97.7 F (36.5 C) (Oral)  Resp 24  Ht 6' (1.829 m)  Wt 71.804 kg (158 lb 4.8 oz)  BMI 21.46 kg/m2  SpO2 97% SpO2: SpO2: 97 % O2 Device: O2 Device: Nasal Cannula O2 Flow Rate: O2 Flow Rate (L/min): 3 L/min  Intake/output summary:  Intake/Output Summary (Last 24 hours) at 12/03/15 1113 Last data filed at 12/02/15 2355  Gross per 24 hour  Intake      0 ml  Output    900 ml  Net   -900 ml   LBM: Last BM Date: 11/25/15 Baseline Weight: Weight: 71.804 kg (158 lb 4.8 oz) Most recent weight: Weight: 71.804 kg (158 lb 4.8 oz)       Palliative Assessment/Data: Flowsheet Rows        Most Recent Value   Intake Tab    Referral Department  Hospitalist   Unit at Time of Referral  Med/Surg Unit   Palliative Care Primary Diagnosis  Other (Comment)   Palliative Care Type  New Palliative care   Reason for referral  Clarify Goals of Care, Counsel Regarding Hospice, End of [place]   Date first seen by Palliative Care  12/02/15   Clinical Assessment    Palliative Performance Scale Score  10%   Pain Max last 24 hours  4   Pain Min Last 24 hours  3   Dyspnea Max Last 24 Hours  4   Dyspnea Min Last 24 hours  3   Psychosocial & Spiritual Assessment    Palliative Care Outcomes    Patient/Family meeting held?  Yes   Who was at the meeting?  daughter, patient, 2 sons, grand son.    Palliative Care Outcomes  Clarified goals of care   Palliative Care follow-up planned  No      Additional Data Reviewed: CBC    Component Value Date/Time   WBC 8.1 12/02/2015 0346   WBC 7.7 11/15/2015   WBC 5.7 04/21/2008 1131   RBC 5.00 12/02/2015 0346   RBC 3.83* 11/11/2015 0524    RBC 5.02 04/21/2008 1131   HGB 9.4* 12/02/2015 0346   HGB 10.6* 04/21/2008 1131   HCT 31.1* 12/02/2015 0346   HCT 33.6* 04/21/2008 1131   PLT 214 12/02/2015 0346   PLT 167 04/21/2008 1131   MCV 62.2* 12/02/2015 0346   MCV 67.0* 04/21/2008 1131   MCH 18.8* 12/02/2015 0346   MCH 21.2* 04/21/2008 1131   MCHC 30.2 12/02/2015 0346   MCHC 31.7* 04/21/2008 1131   RDW 17.6* 12/02/2015 0346   RDW 15.3* 04/21/2008 1131   LYMPHSABS 0.4* 11/28/2015 0405   LYMPHSABS 2.0 04/21/2008 1131   MONOABS 0.2 11/28/2015 0405   MONOABS 0.4 04/21/2008 1131   EOSABS 0.0 11/28/2015 0405   EOSABS 0.3 04/21/2008 1131   BASOSABS 0.0 11/28/2015 0405   BASOSABS 0.0 04/21/2008 1131    CMP     Component Value Date/Time   NA 144 12/02/2015 0346   NA 141 11/15/2015   K 3.8 12/02/2015 0346   CL 99* 12/02/2015 0346   CO2 34* 12/02/2015 0346   GLUCOSE 106* 12/02/2015 0346   BUN 22* 12/02/2015 0346   BUN 28* 11/15/2015   CREATININE 1.00 12/02/2015 0346   CREATININE 1.1 11/15/2015   CREATININE 1.10 04/24/2011 1200   CALCIUM 8.3* 12/02/2015 0346   PROT 5.8* 11/28/2015 0405   ALBUMIN 2.1* 11/28/2015 0405   AST 10* 11/28/2015 0405   ALT 11* 11/28/2015 0405   ALKPHOS 56 11/28/2015 0405   BILITOT 0.7 11/28/2015 0405   GFRNONAA >60 12/02/2015 0346  GFRAA >60 12/02/2015 0346       Problem List:  Patient Active Problem List   Diagnosis Date Noted  . SOB (shortness of breath)   . Encounter for palliative care   . Goals of care, counseling/discussion   . Sacral ulcer 11/29/2015  . E coli bacteremia 11/11/2015  . Pressure ulcer 11/10/2015  . GERD (gastroesophageal reflux disease) 11/10/2015  . Healthcare-associated pneumonia 11/09/2015  . Hypertensive heart disease with CHF (congestive heart failure) ([place]) 11/05/2015  . Obstructive sleep apnea 11/05/2015  . Acute respiratory failure with hypoxia ([place]) 10/29/2015  . HCAP (healthcare-associated pneumonia) 10/24/2015  . AKI (acute kidney injury) ([place])  10/24/2015  . Ileus ([place]) 10/19/2015  . Stage III chronic kidney disease 10/19/2015  . Anemia, iron deficiency 10/19/2015  . COPD (chronic obstructive pulmonary disease) ([place]) 10/19/2015  . Essential hypertension 10/19/2015  . SBO (small bowel obstruction) ([place]) 10/15/2015  . Diarrhea 10/15/2015  . Near syncope 09/20/2015  . Acute kidney injury ([place]) 09/20/2015  . Syncope 09/20/2015  . Hereditary and idiopathic peripheral neuropathy 03/01/2015  . Abnormality of gait 03/01/2015  . Acute on chronic combined systolic and diastolic congestive heart failure, NYHA class 3 ([place])   . Acute pulmonary edema (H[facility])   . Pneumonia   . E-coli UTI   . Acute respiratory failure ([place]) 01/08/2015  . Carotid stenosis 09/28/2014  . Aftercare following surgery of the circulatory system, [place] 01/27/2014  . Chronic systolic CHF (congestive heart failure) ([place]) 11/17/2013  . Cough 11/13/2013  . Microcytic anemia 10/31/2013  . COPD GOLD III 10/30/2013  . Macular pucker 02/27/2012  . EDEMA 01/01/2009  . CAD, NATIVE VESSEL 11/06/2008     Palliative Care Assessment & Plan    1.Code Status:  DNR    Code Status Orders        Start     Ordered   12/02/15 [id number]  Do not attempt resuscitation (DNR)   Continuous    Question Answer Comment  In the event of cardiac or respiratory ARREST Do not call a "code blue"   In the event of cardiac or respiratory ARREST Do not perform Intubation, CPR, defibrillation or ACLS   In the event of cardiac or respiratory ARREST Use medication by any route, position, wound care, and other measures to relive pain and suffering. May use oxygen, suction and manual treatment of airway obstruction as needed for comfort.      12/02/15 1554    Code Status History    Date Active Date Inactive Code Status Order ID Comments User Context   11/27/2015  9:01 AM 12/02/2015  3:54 PM Full Code [id number]  Gennaro Africa, MD ED   11/09/2015 10:37 PM 11/15/2015  6:12 PM Full Code [id number]   Norval Morton, MD ED   11/09/2015 10:37 PM 11/09/2015 10:37 PM Full Code [id number]  Norval Morton, MD ED   10/24/2015  4:50 PM 11/02/2015  6:36 PM Full Code [id number]  Kelvin Cellar, MD Inpatient   10/15/2015  2:01 AM 10/23/2015  4:03 PM Full Code [id number]  Quintella Baton, MD Inpatient   09/20/2015  9:36 PM 09/23/2015  4:41 PM Full Code [id number]  Vianne Bulls, MD ED   01/08/2015  9:19 AM 01/14/2015  4:48 PM Full Code [id number]  Erick Colace, NP ED   09/28/2014  3:23 PM 09/29/2014  1:51 PM Full Code [id number]  Alvia Grove, PA-C Inpatient       2. Goals of Care/Additional Recommendations:  Limitations on Scope  of Treatment: Full Comfort Care  Desire for further Chaplaincy support:no  Psycho-social Needs: Education on Hospice  3. Symptom Management:      1. Continue current care.   4. Palliative Prophylaxis:   Delirium Protocol  5. Prognosis: < 2 weeks  6. Discharge Planning:  Hospice facility   Care plan was discussed with  Patient's grand son, Dr Durenda Hurt.   Thank you for allowing the Palliative Medicine Team to assist in the care of this patient.   Time In:  9 Time Out: 925 Total Time 25 Prolonged Time Billed  no        [id number] Loistine Chance, MD  12/03/2015, 11:13 AM  Please contact Palliative Medicine Team phone at [phone] for questions and concerns.

## 2015-12-03 NOTE — Progress Notes (Signed)
CSW received notification from [facility] that facility does not have available bed.   CSW spoke with [facility] liaison, [place] who came to hospital to assess pt and meet with pt family. Pt approved to go to [facility] and bed available today. Pt daughter agreeable to transfer today.  CSW facilitated pt discharge needs including notifying MD, notifying [facility] that dc summary available, discussing with pt daughter, [name] at bedside, providing RN phone number to call report, and arranging ambulance transport for pt to [facility] [place].   Pt family coping as well as to be anticipated with transition to [facility] [place] for EOL care.  No further social work needs identified at this time.  CSW signing off.   Alison Murray, MSW, [place] Work [phone]

## 2015-12-03 NOTE — Discharge Summary (Signed)
Physician Discharge Summary  [name] [id number] DOB: 23-Sep-1923 DOA: 11/27/2015  PCP:  Melinda Crutch, MD  Admit date: 11/27/2015 Discharge date: 12/03/2015  Time spent: 45 minutes  Recommendations for Outpatient Follow-up:  1. [facility]idential Hospice for End of Life/Comfort care  Discharge Diagnoses:    Recurrent Aspiration pneumonia   Severe Dysphagia   Hypoxia   HCAP (healthcare-associated pneumonia)   Chronic systolic CHF (congestive heart failure) (HCC)   Pneumonia   Sacral ulcer   SOB (shortness of breath)   Encounter for palliative care   Goals of care, counseling/discussion   Discharge Condition: poor  Diet recommendation: Comfort feeds  Filed Weights   11/27/15 1040  Weight: 71.804 kg (158 lb 4.8 oz)    History of present illness:  Chief Complaint: Dyspnea, Hypoxia  History of Present Illness:  Patient is a 82 yo male with history of systolic HF, HTN, CKDIII, COPD, DM II, and peripheral neuropathy who was brought to the ER from rehab due to dyspnea and hypoxia. He was recently admitted 3/28-4/3 for acute respiratory failure due to COPD exacerbation / HCAP . Patient is no mobile recently due to worsening peripheral neuropathy. He desated while in bed down to 70s on 2L of oxygen. He noticed having rattling voice from his chest, cough with inability to clear sputum, and dyspnea  Hospital Course:  Recurrent Aspiration pneumonia -has underlying COPD too -was on HCAP coverage with vancomycin, cefepime and azithromycin, - Abx changed to Azithromycin and Vanc 4/19-sputum Cx with MRSA and multiple allergies noted -He was recently treated for Escherichia coli pneumonia, likely aspiration related, this is 4th admission since March for similar issues -clinically noted to have severe ongoing aspiration -s/p MBS and SLP eval: severe pharyngeal Dysphagia, severe residuals with pt only clearing approximately 20% into esophagus, remainder was retained in pharynx, recommended Po only  for comfort understanding aspiration risks -I called Daughter [name] 4/19 and recommended consideration of DNR and Palliative meeting -s/p Palliative care family meeting 4/20 with extended family and decision was made for DNR and Full Comfort care and hence is being discharged to Residential Hospice for End of life care  Chronic Systolic CHF: Echo in March 2017 with EF of 40-45% -comfort care now  Chronic Anemia : Microcytic : iron deficiency and chronic disease, At baseline  HTN:   DMII: -CBGs were in 70-90 range, hence stopped lantus, po intake Poor  Stage II pressure ulcer: Wound care.   Consultations:  Palliative medicine  Discharge Exam: Filed Vitals:   12/02/15 2118 12/03/15 0606  BP: 135/52 149/75  Pulse: 74 67  Temp: 97.3 F (36.3 C) 97.7 F (36.5 C)  Resp: 19 24    General: somnolent, arousable, no distress Cardiovascular: S1S2/RRR Respiratory: Ronchi at both bases  Discharge Instructions   Discharge Instructions    Discharge instructions    Complete by:  As directed   Comfort feeds     Increase activity slowly    Complete by:  As directed           Current Discharge Medication List    START taking these medications   Details  Morphine Sulfate (MORPHINE CONCENTRATE) 10 mg / 0.5 ml concentrated solution Take 0.25 mLs (5 mg total) by mouth every 4 (four) hours as needed for severe pain or shortness of breath. Qty: 15 mL, Refills: 0      CONTINUE these medications which have NOT CHANGED   Details  acetaminophen (TYLENOL) 325 MG tablet Take 650 mg by mouth every  6 (six) hours as needed for mild pain.     albuterol (PROVENTIL) (2.5 MG/3ML) 0.083% nebulizer solution Take 3 mLs (2.5 mg total) by nebulization every 6 (six) hours as needed for wheezing. Qty: 75 mL, Refills: 12    guaifenesin (ROBITUSSIN) 100 MG/5ML syrup Take 10 mg by mouth every 4 (four) hours as needed for cough.    magic mouthwash SOLN Take 5 mLs by mouth 4 (four) times  daily. Refills: 0    MYRBETRIQ 50 MG TB24 tablet Take 50 mg by mouth daily. Refills: 11    NITROSTAT 0.4 MG SL tablet PLACE 1 TABLET (0.4 MG TOTAL) UNDER THE TONGUE EVERY 5 (FIVE) MINUTES AS NEEDED. FOR CHEST PAIN Qty: 25 tablet, Refills: 3    omeprazole (PRILOSEC) 20 MG capsule Take 40 mg by mouth daily.    polyethylene glycol (MIRALAX / GLYCOLAX) packet Take 17 g by mouth 2 (two) times daily. Qty: 14 each, Refills: 0    senna-docusate (SENOKOT-S) 8.6-50 MG tablet Take 1 tablet by mouth 2 (two) times daily. Qty: 14 tablet, Refills: 0      STOP taking these medications     acidophilus (RISAQUAD) CAPS capsule      aspirin EC 81 MG tablet      carvedilol (COREG) 12.5 MG tablet      ferrous sulfate 325 (65 FE) MG tablet      furosemide (LASIX) 40 MG tablet      Glycopyrrolate-Formoterol (BEVESPI AEROSPHERE) 9-4.8 MCG/ACT AERO      guaiFENesin (MUCINEX) 600 MG 12 hr tablet      insulin aspart (NOVOLOG) 100 UNIT/ML injection      insulin glargine (LANTUS) 100 UNIT/ML injection      ipratropium-albuterol (DUONEB) 0.5-2.5 (3) MG/3ML SOLN      LINZESS 145 MCG CAPS capsule      losartan (COZAAR) 25 MG tablet      meclizine (ANTIVERT) 25 MG tablet      mometasone-formoterol (DULERA) 200-5 MCG/ACT AERO      Protein (PROCEL) POWD      simvastatin (ZOCOR) 10 MG tablet      tuberculin 5 UNIT/0.1ML injection      predniSONE (DELTASONE) 10 MG tablet      pregabalin (LYRICA) 75 MG capsule        Allergies  Allergen Reactions  . Clopidogrel Bisulfate Hives    REACTION: red blue, black blotches  . Dilaudid [Hydromorphone Hcl] Hives and Other (See Comments)    Hallucinations, little green men working on the building  . Penicillins Hives    Has patient had a PCN reaction causing immediate rash, facial/tongue/throat swelling, SOB or lightheadedness with hypotension: Yes Has patient had a PCN reaction causing severe rash involving mucus membranes or skin necrosis: No Has  patient had a PCN reaction that required hospitalization No Has patient had a PCN reaction occurring within the last 10 years: No Tolerates cephalosporins   . Sulfa Antibiotics Other (See Comments)    Nightmares, hallucinations  . Doxycycline Hyclate Rash      The results of significant diagnostics from this hospitalization (including imaging, microbiology, ancillary and laboratory) are listed below for reference.    Significant Diagnostic Studies: Dg Chest 2 View  11/27/2015  CLINICAL DATA:  Dyspnea, cough and congestion. Low oxygen saturations despite nebulizer treatments. EXAM: CHEST  2 VIEW COMPARISON:  11/10/2015 FINDINGS: There is consolidation in the right lower lobe posteriorly, consistent with pneumonia but aspiration or hemorrhage could also produce this appearance. The left lung is  clear except for unchanged linear basilar scarring or atelectasis. The pulmonary vasculature is normal. There are no pleural effusions. Hilar and mediastinal contours are unremarkable and unchanged. IMPRESSION: Right lower lobe consolidation posteriorly, likely pneumonia. Pulmonary hemorrhage could also produce this appearance. Electronically Signed   By: Andreas Newport M.D.   On: 11/27/2015 06:44   Dg Chest 2 View  11/09/2015  CLINICAL DATA:  Shortness of breath. EXAM: CHEST  2 VIEW COMPARISON:  10/28/2015 chest radiograph. FINDINGS: Surgical clips in the medial lower right neck. Stable cardiomediastinal silhouette with normal heart size. No pneumothorax. No pleural effusion. Patchy consolidation throughout both lower lobes. No pulmonary edema. IMPRESSION: Patchy bilateral lower lobe consolidation, most suggestive of multilobar pneumonia. Recommend follow-up PA and lateral post treatment chest radiographs in 4-6 weeks. Electronically Signed   By: Ilona Sorrel M.D.   On: 11/09/2015 20:38   Ct Chest Wo Contrast  11/10/2015  CLINICAL DATA:  Shortness of breath and cough EXAM: CT CHEST WITHOUT CONTRAST  TECHNIQUE: Multidetector CT imaging of the chest was performed following the standard protocol without IV contrast. COMPARISON:  11/09/2015 FINDINGS: The lungs are well aerated bilaterally. Patchy infiltrates are seen bilaterally worst in the right middle and right lower lobes consistent with acute pneumonia. Some scattered more nodular appearing infiltrates are seen as well. One of these nodular areas measures approximately 6 mm in the right upper lobe best seen on image number 30 of series 5. Second somewhat nodular area is noted on the left measuring 12 mm is in dimension. This is best seen on image number 27 of series 5. A small right-sided pleural effusion is seen. The thoracic inlet is within normal limits. Aortic calcifications are noted. No significant hilar or mediastinal adenopathy is seen. Some calcification in the left ventricular wall is noted likely related to prior infarct. The visualized upper abdomen shows changes of prior cholecystectomy. A left renal cyst is noted as well. Degenerative changes of the thoracic spine are noted. IMPRESSION: Infiltrative changes in both lungs worst in the right middle and right lower lobes. Some nodularity is noted in scattered areas throughout both lungs. This all again likely represents an acute pneumonic process. Short-term followup following appropriate therapy with noncontrast CT of the chest is recommended to assess for resolution or stability. This could be performed in 4-6 weeks Electronically Signed   By: Inez Catalina M.D.   On: 11/10/2015 12:44   Dg Swallowing Func-speech Pathology  11/29/2015  Objective Swallowing Evaluation: Type of Study: MBS-Modified Barium Swallow Study Patient Details Name: PEYTIN TABOADA MRN: [id number] Date of Birth: 12-Jul-1924 Today's Date: 11/29/2015 Time: SLP Start Time (ACUTE ONLY): 1317-SLP Stop Time (ACUTE ONLY): 1356 SLP Time Calculation (min) (ACUTE ONLY): 39 min Past Medical History: Past Medical History Diagnosis Date . Carotid  artery stenosis  . Essential hypertension  . Myocardial infarction ([place]) 1979 . Coronary artery disease    Known occluded proximal LAD with collaterals, moderate RCA disease - managed medically 2009 . COPD (chronic obstructive pulmonary disease) ([place])  . History of stroke  . Prostate cancer ([place])  . Arthritis  . Foot drop, right  . Neuropathy ([place])  . Anemia  . Urinary incontinence, male, stress  . GERD (gastroesophageal reflux disease)  . Thalassemia  . Wears dentures  . Deafness in left ear    Wears hearing aide in right ear . Sleep apnea    CPAP . Ischemic cardiomyopathy    LVEF 40-45% . Hypercholesteremia  . Anemia  . Osteoarthritis  .  Stroke ([place])  . Heart attack ([place])  . Vertigo  . Hereditary and idiopathic peripheral neuropathy 03/01/2015 . Abnormality of gait 03/01/2015 Past Surgical History: Past Surgical History Procedure Laterality Date . Penile prosthesis implant   . Cholecystectomy   . Right carotid endarterectomy   . Back surgery   . Lumbar laminectomy  03/24/10   L3-4 and L4-5 . Ankle surgery     Bone infection . Cardiac catheterization   . Appendectomy   . Joint replacement Left  . Radioactive seed implant  2009 . Pars plana vitrectomy  03/19/2012   Procedure: PARS PLANA VITRECTOMY WITH 25 GAUGE;  Surgeon: Hayden Pedro, MD;  Location: [facility];  Service: Ophthalmology;  Laterality: Right; . Carotid endarterectomy Right    CEA - 15 yrs ago . Spine surgery   . Eye surgery   . Cataract extraction w/ intraocular lens implant Left  . Endarterectomy Left 09/28/2014   Procedure: Left Carotid Endarterectomy;  Surgeon: Rosetta Posner, MD;  Location: [facility];  Service: Vascular;  Laterality: Left; . Patch angioplasty Left 09/28/2014   Procedure: WITH DACRON PATCH ANGIOPLASTY;  Surgeon: Rosetta Posner, MD;  Location[facility];  Service: Vascular;  Laterality: Left; HPI: 82 yo male with history of systolic HF, HTN, CKDIII, COPD, DM II, stroke GERD, deaf in left ear and peripheral neuropathy who was brought here from rehab due  to dyspnea and hypoxia. Per chart recent admission for for acute respiratory failure due to COPD exacerbation / HCAP (11/09/15). CXR right lower lobe consolidation posteriorly, likely pneumonia. Pulmonary hemorrhage could also produce this appearance. No previous ST notes found in EPIC.  MD documented pt with difficulty managing secretions and he assisted pt with oral suctioning.    CXR showed right lower lobe consolidation - posterior concerning for pna.   Subjective: pt awake in chair Assessment / Plan / Recommendation CHL IP CLINICAL IMPRESSIONS 11/29/2015 Therapy Diagnosis Moderate oral phase dysphagia;Severe pharyngeal phase dysphagia Clinical Impression Moderate oral and severe pharyngeal dysphagia characterized mostly by gross weakness.   Pt with very poor muscle contraction resulting in severe residuals with pt only clearing approximately 20% into esophagus.  Remainder was retained in pharynx and required cued expectoration to clear.  Trace laryngeal penetration/aspiration of liquids noted without cough response- with every liquid swallow. Pt with open larynx throughout entire swallow despite chin tuck posture.  Penetrates were moved to to upper larynx with cued throat clear but would consistently re-penetrate.  Of note, pt did cough x1 during testing - not associated with aspiration. Various techniques including effortful swallow, following solids with liquids not helpful.   Fortunately, pt able to cough/"hock" and expectorate residuals with cueing.  He is however grossly weak and just participating in this MBS was exhausting for him and pt only consumed approximately six boluses.   Pt states his swallowing ability is as good as normal and he admits to dysphagia x one year.  Pt's level of dysphagia does not allow him to meet nutritional needs   At this time based on this test and pt report, pt will aspirate intake and secretions.  Concern is present for swallowing prognosis given his report of chronic  deficits.  If pt is to continue po, it should be with known risks and for comfort.  Will follow up for family education and to determine if any assistance/treatment indicated/helpful.  Educated pt to findings/recommendations live using monitor.    Impact on safety and function --   CHL IP TREATMENT RECOMMENDATION  11/27/2015 Treatment Recommendations Therapy as outlined in treatment plan below   Prognosis 11/29/2015 Prognosis for Safe Diet Advancement Guarded Barriers to Reach Goals -- Barriers/Prognosis Comment -- CHL IP DIET RECOMMENDATION 11/29/2015 SLP Diet Recommendations NPO Liquid Administration via -- Medication Administration Via alternative means Compensations Slow rate;Small sips/bites;Other (Comment) Postural Changes --       CHL IP FREQUENCY AND DURATION 11/29/2015 Speech Therapy Frequency (ACUTE ONLY) min 2x/week Treatment Duration 1 week      CHL IP ORAL PHASE 11/29/2015 Oral Phase -- Oral - Pudding Teaspoon -- Oral - Pudding Cup -- Oral - Honey Teaspoon -- Oral - Honey Cup -- Oral - Nectar Teaspoon -- Oral - Nectar Cup -- Oral - Nectar Straw Weak lingual manipulation;Delayed oral transit Oral - Thin Teaspoon Weak lingual manipulation;Delayed oral transit Oral - Thin Cup -- Oral - Thin Straw Weak lingual manipulation;Delayed oral transit Oral - Puree Weak lingual manipulation;Delayed oral transit Oral - Mech Soft -- Oral - Regular Weak lingual manipulation;Delayed oral transit Oral - Multi-Consistency -- Oral - Pill -- Oral Phase - Comment --  CHL IP PHARYNGEAL PHASE 11/29/2015 Pharyngeal Phase Impaired Pharyngeal- Pudding Teaspoon -- Pharyngeal -- Pharyngeal- Pudding Cup -- Pharyngeal -- Pharyngeal- Honey Teaspoon -- Pharyngeal -- Pharyngeal- Honey Cup -- Pharyngeal -- Pharyngeal- Nectar Teaspoon Reduced pharyngeal peristalsis;Reduced epiglottic inversion;Reduced laryngeal elevation;Reduced airway/laryngeal closure;Reduced tongue base retraction;Pharyngeal residue - valleculae;Pharyngeal residue - pyriform  Pharyngeal -- Pharyngeal- Nectar Cup -- Pharyngeal -- Pharyngeal- Nectar Straw Reduced pharyngeal peristalsis;Reduced epiglottic inversion;Reduced airway/laryngeal closure;Reduced laryngeal elevation;Reduced tongue base retraction;Penetration/Apiration after swallow;Pharyngeal residue - valleculae;Pharyngeal residue - pyriform Pharyngeal Material enters airway, passes BELOW cords without attempt by patient to eject out (silent aspiration);Material enters airway, remains ABOVE vocal cords and not ejected out;Material enters airway, CONTACTS cords and not ejected out Pharyngeal- Thin Teaspoon Reduced pharyngeal peristalsis;Reduced epiglottic inversion;Reduced tongue base retraction;Reduced airway/laryngeal closure;Reduced laryngeal elevation;Penetration/Aspiration during swallow;Pharyngeal residue - valleculae;Pharyngeal residue - pyriform Pharyngeal Material enters airway, remains ABOVE vocal cords and not ejected out Pharyngeal- Thin Cup -- Pharyngeal -- Pharyngeal- Thin Straw Delayed swallow initiation-pyriform sinuses;Reduced pharyngeal peristalsis;Penetration/Aspiration before swallow;Reduced epiglottic inversion Pharyngeal -- Pharyngeal- Puree Reduced pharyngeal peristalsis;Reduced epiglottic inversion;Reduced anterior laryngeal mobility;Reduced laryngeal elevation;Reduced airway/laryngeal closure;Reduced tongue base retraction;Pharyngeal residue - valleculae Pharyngeal -- Pharyngeal- Mechanical Soft -- Pharyngeal -- Pharyngeal- Regular Reduced pharyngeal peristalsis;Reduced epiglottic inversion;Reduced tongue base retraction;Pharyngeal residue - valleculae Pharyngeal -- Pharyngeal- Multi-consistency -- Pharyngeal -- Pharyngeal- Pill -- Pharyngeal -- Pharyngeal Comment chin tuck, cued cough, cued dry swallow, cued expectoration effective, dry swallows weak  Luanna Salk, MS [facility] SLP [phone]    Microbiology: Recent Results (from the past 240 hour(s))  Blood Culture (routine x 2)     Status: None    Collection Time: 11/27/15  6:50 AM  Result Value Ref Range Status   Specimen Description BLOOD RIGHT HAND  5 ML IN [facility] BOTTLE  Final   Special Requests NONE  Final   Culture   Final    NO GROWTH 5 DAYS Performed at [facility]    Report Status 12/02/2015 FINAL  Final  Blood Culture (routine x 2)     Status: None   Collection Time: 11/27/15  8:25 AM  Result Value Ref Range Status   Specimen Description BLOOD LEFT WRIST  5 ML IN [facility] BOTTLE  Final   Special Requests NONE  Final   Culture   Final    NO GROWTH 5 DAYS Performed at [facility]    Report Status 12/02/2015 FINAL  Final  Influenza virus ag, a+b (DFA)     Status: None   Collection Time: 11/27/15 10:44 AM  Result Value Ref Range Status   Influenza Virus A and B Ag REPORT  Corrected    Comment: (NOTE) Influenza Virus Type A#B Antigen, DFA SOURCE : NOT SUPPLIED Influenza Type A Ag, DFA        Negative for influenza virus Influenza Type B Ag, DFA        Negative for influenza virus For maximum sensitivity in the diagnosis of viral infections, a negative or suspected positive result should be confirmed by viral culture. Performed at [facility] 04/18 AT 1802: PREVIOUSLY REPORTED AS REPORT   Culture, sputum-assessment     Status: None   Collection Time: 11/27/15 11:30 AM  Result Value Ref Range Status   Specimen Description SPUTUM  Final   Special Requests NONE  Final   Sputum evaluation THIS SPECIMEN IS ACCEPTABLE FOR SPUTUM CULTURE  Final   Report Status 11/27/2015 FINAL  Final  MRSA PCR Screening     Status: Abnormal   Collection Time: 11/27/15 11:30 AM  Result Value Ref Range Status   MRSA by PCR POSITIVE (A) NEGATIVE Final    Comment:        The GeneXpert MRSA Assay (FDA approved for NASAL specimens only), is one component of a comprehensive MRSA colonization surveillance program. It is not intended to diagnose MRSA infection nor to guide or monitor treatment for MRSA  infections. RESULT CALLED TO, READ BACK BY AND VERIFIED WITH: E.MACABUEAG RN AT [id number] ON 11/27/15 BY S.VANHOORNE   Culture, respiratory (NON-Expectorated)     Status: None   Collection Time: 11/27/15 11:30 AM  Result Value Ref Range Status   Specimen Description SPUTUM  Final   Special Requests NONE  Final   Gram Stain   Final    FEW WBC PRESENT,BOTH PMN AND MONONUCLEAR FEW SQUAMOUS EPITHELIAL CELLS PRESENT MODERATE GRAM POSITIVE COCCI IN CLUSTERS FEW GRAM POSITIVE RODS FEW GRAM NEGATIVE RODS Performed at [facility]    Culture   Final    ABUNDANT METHICILLIN RESISTANT STAPHYLOCOCCUS AUREUS Note: RIFAMPIN AND GENTAMICIN SHOULD NOT BE USED AS SINGLE DRUGS FOR TREATMENT OF STAPH INFECTIONS. This organism is presumed to be Clindamycin resistant based on detection of inducible Clindamycin resistance. CRITICAL RESULT CALLED TO, READ BACK BY AND  VERIFIED WITH: ELSIE RN 4.19 17 BY PERCN Performed at [facility]    Report Status 12/01/2015 FINAL  Final   Organism ID, Bacteria METHICILLIN RESISTANT STAPHYLOCOCCUS AUREUS  Final      Susceptibility   Methicillin resistant staphylococcus aureus - MIC*    CLINDAMYCIN RESISTANT      ERYTHROMYCIN >=8 RESISTANT Resistant     GENTAMICIN <=0.5 SENSITIVE Sensitive     LEVOFLOXACIN >=8 RESISTANT Resistant     OXACILLIN >=4 RESISTANT Resistant     RIFAMPIN <=0.5 SENSITIVE Sensitive     TRIMETH/SULFA <=10 SENSITIVE Sensitive     VANCOMYCIN 1 SENSITIVE Sensitive     TETRACYCLINE <=1 SENSITIVE Sensitive     * ABUNDANT METHICILLIN RESISTANT STAPHYLOCOCCUS AUREUS     Labs: Basic Metabolic Panel:  Recent Labs Lab 11/27/15 0648 11/28/15 0405 11/30/15 0328 12/01/15 0324 12/02/15 0346  NA 141 141 141 145 144  K 3.9 3.7 3.3* 3.9 3.8  CL 102 103 98* 99* 99*  CO2 30 29 36* 38* 34*  GLUCOSE 158* 157* 166* 88 106*  BUN 22* 24* 29* 26* 22*  CREATININE 1.04  1.05 1.24 1.33* 1.00  CALCIUM 8.7* 8.1* 8.4* 8.8* 8.3*   Liver Function  Tests:  Recent Labs Lab 11/28/15 0405  AST 10*  ALT 11*  ALKPHOS 56  BILITOT 0.7  PROT 5.8*  ALBUMIN 2.1*   No results for input(s): LIPASE, AMYLASE in the last 168 hours. No results for input(s): AMMONIA in the last 168 hours. CBC:  Recent Labs Lab 11/27/15 0648 11/28/15 0405 11/30/15 0328 12/02/15 0346  WBC 6.9 4.4 6.5 8.1  NEUTROABS 5.7 3.8  --   --   HGB 8.9* 8.6* 9.0* 9.4*  HCT 28.6* 28.3* 29.5* 31.1*  MCV 62.7* 62.6* 62.8* 62.2*  PLT 170 153 190 214   Cardiac Enzymes: No results for input(s): CKTOTAL, CKMB, CKMBINDEX, TROPONINI in the last 168 hours. BNP: BNP (last 3 results)  Recent Labs  09/20/15 1833 10/26/15 0245 11/09/15 1923  BNP 196.1* 631.1* 423.8*    ProBNP (last 3 results) No results for input(s): PROBNP in the last 8760 hours.  CBG:  Recent Labs Lab 12/02/15 1235 12/02/15 1637 12/02/15 2224 12/03/15 0800 12/03/15 1221  GLUCAP 225* 175* 142* 138* 171*       Signed:  Jordayn Mink MD.  [facility] 12/03/2015, 1:03 PM

## 2015-12-03 NOTE — Progress Notes (Signed)
Report called to [facility] [place].

## 2015-12-03 NOTE — Progress Notes (Signed)
CSW received referral for residential hospice placement.  CSW met with pt daughter, [name] following palliative meeting on 4/20.  CSW discussed recommendation for residential hospice. CSW offered residential hospice choice. Pt daughter prefers [facility]. Pt daughter states that [place] [facility] [place] would be secondary option if [facility] does not have availability.   CSW made referral to [place], [place] and made referral to [facility] [place]. Facilities will process referral and notify CSW of availability.   Alison Murray, MSW, [place] Work [phone]

## 2015-12-13 DEATH — deceased

## 2016-02-22 ENCOUNTER — Other Ambulatory Visit: Payer: Self-pay

## 2016-09-11 ENCOUNTER — Encounter: Payer: Self-pay | Admitting: Cardiology

## 2017-10-19 IMAGING — CT CT HEAD W/O CM
3 of 6 series · 13 of 47 positions shown, 15 images · non-contrast
Comparison: CT dated 11/20/2013

CLINICAL DATA: 89-year-old male with fall and trauma to the head.

EXAM:
CT HEAD WITHOUT CONTRAST
CT CERVICAL SPINE WITHOUT CONTRAST
TECHNIQUE: Multidetector CT imaging of the head and cervical spine was
performed following the standard protocol without intravenous
contrast. Multiplanar CT image reconstructions of the cervical spine
were also generated.

[Series 304: cor · coronal · 0.31mm/px · 3 of 39 slices shown]
[im 13/39  brain]
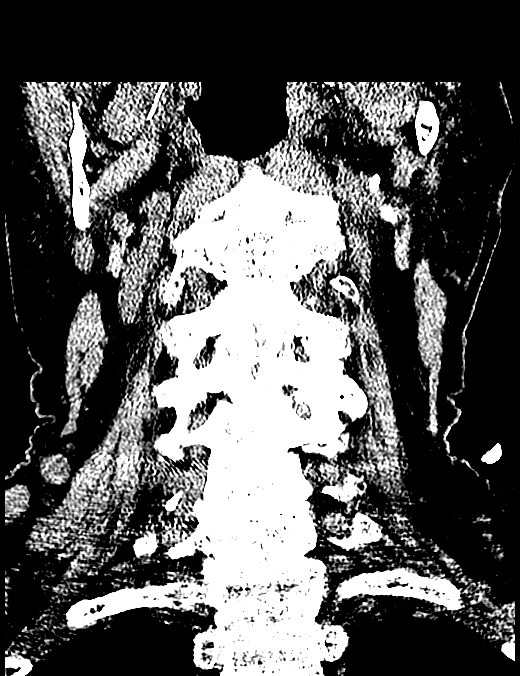
[im 17/39  brain]
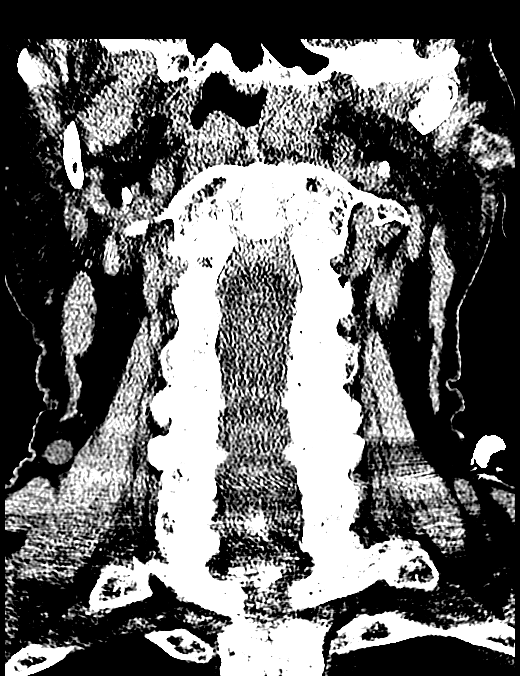
[im 22/39  brain]
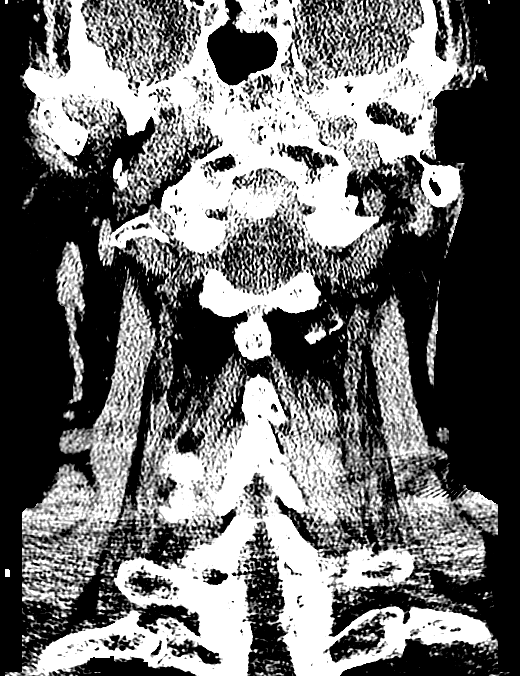

[Series 305: sag · sagittal · 0.31mm/px · 3 of 52 slices shown]
[im 18/52  brain]
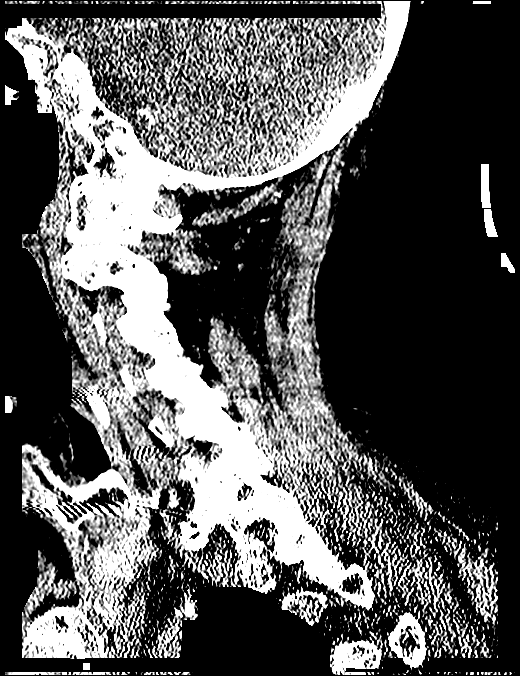
[im 26/52  brain]
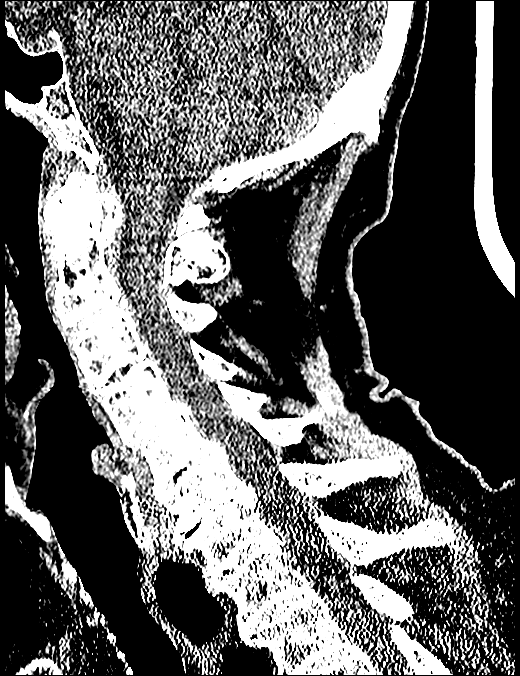
[im 35/52  brain]
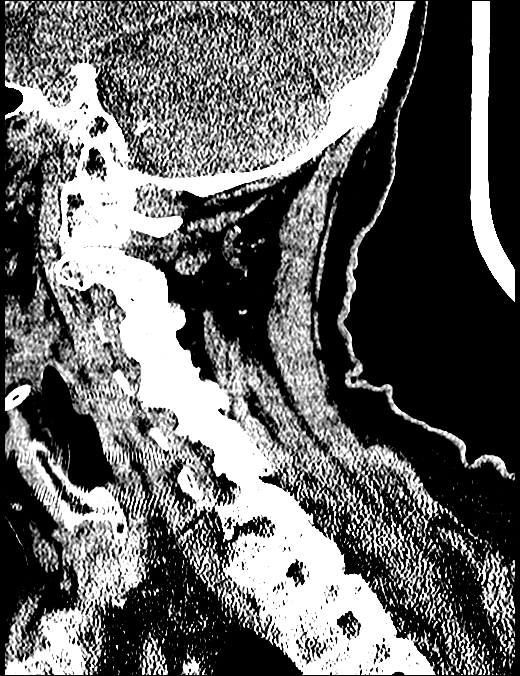

[Series 306: orthogonals · axial · 0.31mm/px · z∈[+35,+152]mm · 7 of 87 slices shown, 9 images]
[im 11/87  brain]
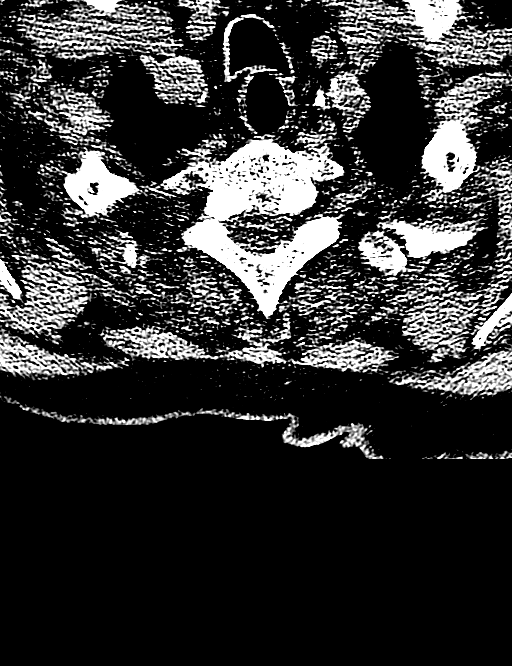
[im 11/87  bone]
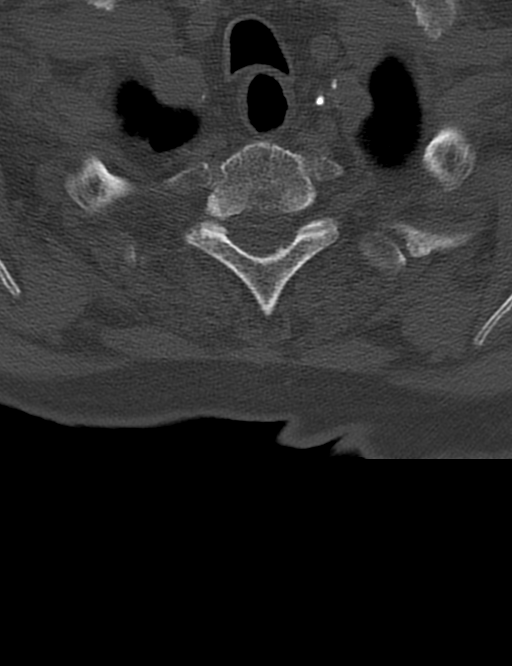
[im 22/87  brain]
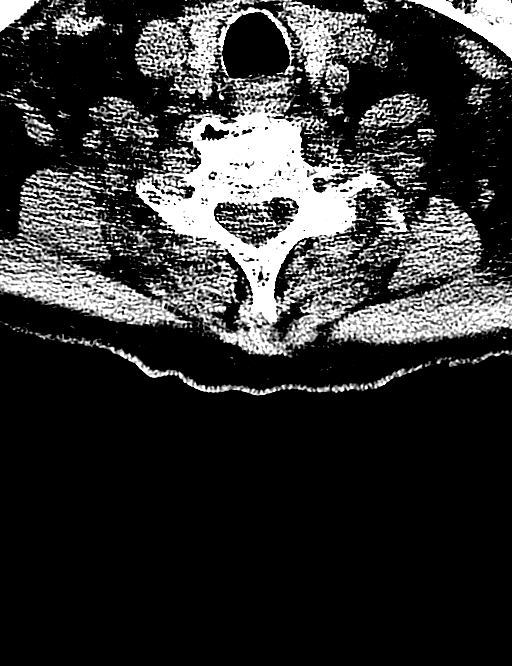
[im 33/87  brain]
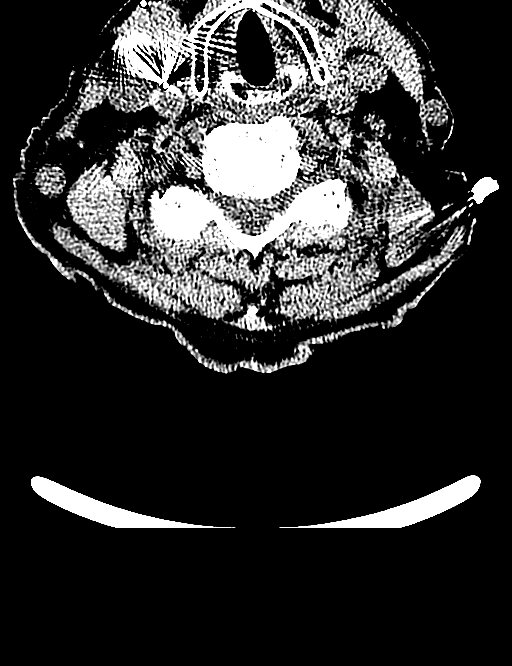
[im 44/87  brain]
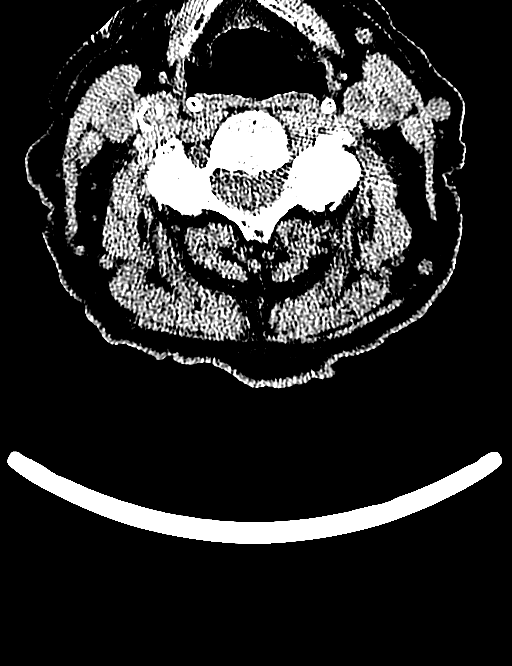
[im 54/87  brain]
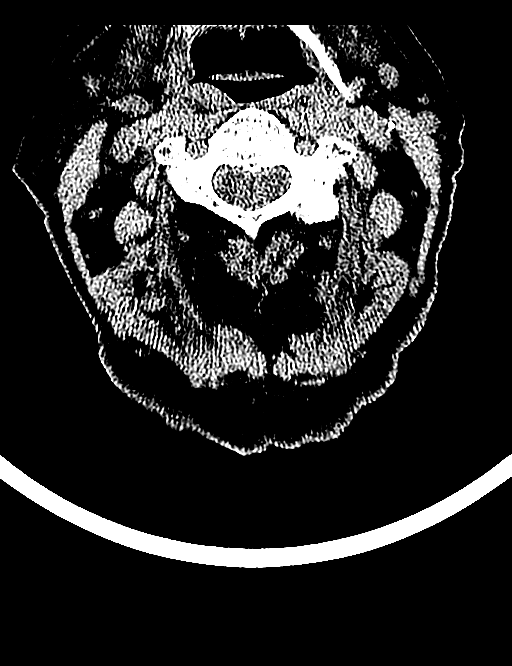
[im 54/87  bone]
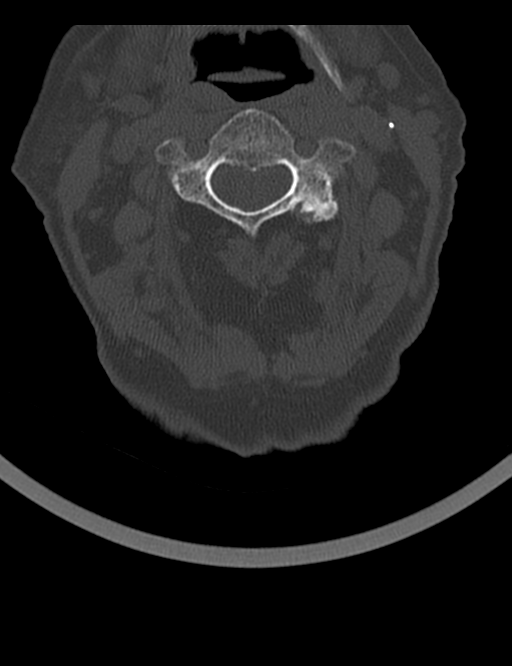
[im 65/87  brain]
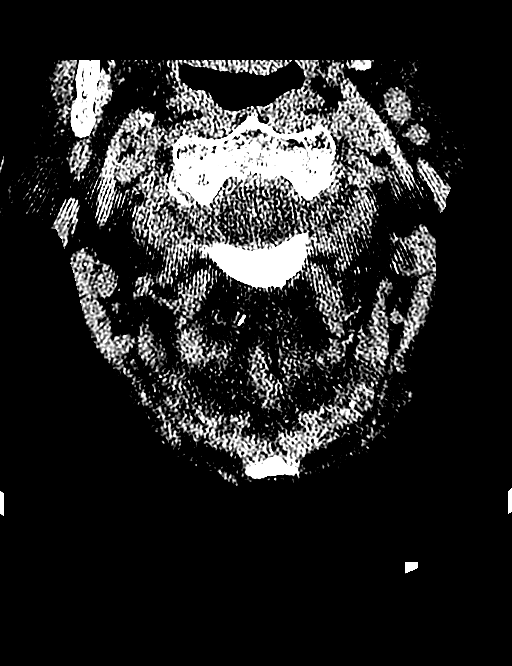
[im 76/87  brain]
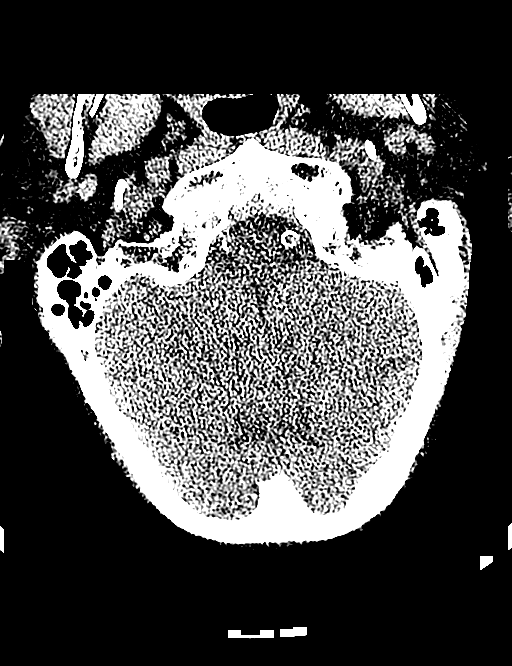

[13 of 47 positions shown; findings below may reference images not displayed]

FINDINGS: CT HEAD FINDINGS

There is slight prominence of the ventricles and sulci compatible
with age-related volume loss. Mild periventricular and deep white
matter hypodensities represent chronic microvascular ischemic
changes. Small old left basal ganglia lacunar infarct. There is no
intracranial hemorrhage. No mass effect or midline shift identified.

There is partial opacification of the right frontal sinus. No
air-fluid level. The remainder of the paranasal sinuses and mastoid
air cells are well aerated. The calvarium is intact.

CT CERVICAL SPINE FINDINGS

There is no acute fracture or subluxation of the cervical
spine.There is osteopenia with multilevel degenerative changes.
There is disc desiccation and narrowing most prominent at C5-C6, and
C6-C7.The odontoid and spinous processes are intact.There is normal
anatomic alignment of the C1-C2 lateral masses. The visualized soft
tissues appear unremarkable.

Multiple surgical clips noted in the right side of the neck possibly
related to carotid endarterectomy. Right carotid bulb calcific
plaques noted.
IMPRESSION: No acute intracranial hemorrhage.

Mild age-related atrophy and chronic microvascular ischemic disease.

No acute/traumatic cervical spine pathology.
